# Patient Record
Sex: Male | Born: 1970
Health system: Southern US, Community
[De-identification: ages and names within clinical notes are randomized; demographics above are authoritative.]

## PROBLEM LIST (undated history)

## (undated) DIAGNOSIS — I10 Essential (primary) hypertension: Secondary | ICD-10-CM

## (undated) DIAGNOSIS — I639 Cerebral infarction, unspecified: Secondary | ICD-10-CM

## (undated) DIAGNOSIS — F4323 Adjustment disorder with mixed anxiety and depressed mood: Secondary | ICD-10-CM

## (undated) DIAGNOSIS — E119 Type 2 diabetes mellitus without complications: Secondary | ICD-10-CM

## (undated) HISTORY — DX: Type 2 diabetes mellitus without complications: E11.9

## (undated) HISTORY — DX: Cerebral infarction, unspecified: I63.9

---

## 2017-03-18 ENCOUNTER — Encounter (HOSPITAL_COMMUNITY): Payer: Self-pay

## 2017-03-18 ENCOUNTER — Emergency Department (HOSPITAL_COMMUNITY): Payer: Medicaid Other

## 2017-03-18 ENCOUNTER — Inpatient Hospital Stay (HOSPITAL_COMMUNITY)
Admission: EM | Admit: 2017-03-18 | Discharge: 2017-03-23 | DRG: 062 | Disposition: A | Discharge status: Rehabilitation Facility | Payer: Medicaid Other | Attending: Neurology | Admitting: Neurology

## 2017-03-18 DIAGNOSIS — I161 Hypertensive emergency: Secondary | ICD-10-CM | POA: Diagnosis present

## 2017-03-18 DIAGNOSIS — I10 Essential (primary) hypertension: Secondary | ICD-10-CM | POA: Diagnosis present

## 2017-03-18 DIAGNOSIS — I69354 Hemiplegia and hemiparesis following cerebral infarction affecting left non-dominant side: Secondary | ICD-10-CM | POA: Diagnosis not present

## 2017-03-18 DIAGNOSIS — I6350 Cerebral infarction due to unspecified occlusion or stenosis of unspecified cerebral artery: Secondary | ICD-10-CM | POA: Diagnosis present

## 2017-03-18 DIAGNOSIS — Z6832 Body mass index (BMI) 32.0-32.9, adult: Secondary | ICD-10-CM | POA: Diagnosis not present

## 2017-03-18 DIAGNOSIS — E876 Hypokalemia: Secondary | ICD-10-CM | POA: Diagnosis present

## 2017-03-18 DIAGNOSIS — E669 Obesity, unspecified: Secondary | ICD-10-CM | POA: Diagnosis present

## 2017-03-18 DIAGNOSIS — R9082 White matter disease, unspecified: Secondary | ICD-10-CM | POA: Diagnosis present

## 2017-03-18 DIAGNOSIS — I63211 Cerebral infarction due to unspecified occlusion or stenosis of right vertebral arteries: Secondary | ICD-10-CM | POA: Diagnosis present

## 2017-03-18 DIAGNOSIS — I639 Cerebral infarction, unspecified: Secondary | ICD-10-CM | POA: Diagnosis present

## 2017-03-18 DIAGNOSIS — R29702 NIHSS score 2: Secondary | ICD-10-CM | POA: Diagnosis present

## 2017-03-18 DIAGNOSIS — I672 Cerebral atherosclerosis: Secondary | ICD-10-CM | POA: Diagnosis present

## 2017-03-18 DIAGNOSIS — Z7982 Long term (current) use of aspirin: Secondary | ICD-10-CM

## 2017-03-18 DIAGNOSIS — R739 Hyperglycemia, unspecified: Secondary | ICD-10-CM | POA: Diagnosis present

## 2017-03-18 DIAGNOSIS — E785 Hyperlipidemia, unspecified: Secondary | ICD-10-CM | POA: Diagnosis present

## 2017-03-18 HISTORY — DX: Essential (primary) hypertension: I10

## 2017-03-18 LAB — RAPID URINE DRUG SCREEN, HOSP PERFORMED
AMPHETAMINES: NOT DETECTED
Barbiturates: NOT DETECTED
Benzodiazepines: NOT DETECTED
Cocaine: NOT DETECTED
OPIATES: NOT DETECTED
TETRAHYDROCANNABINOL: NOT DETECTED

## 2017-03-18 LAB — I-STAT CG4 LACTIC ACID, ED: LACTIC ACID, VENOUS: 2.23 mmol/L — AB (ref 0.5–1.9)

## 2017-03-18 LAB — I-STAT CHEM 8, ED
BUN: 17 mg/dL (ref 6–20)
Calcium, Ion: 1.1 mmol/L — ABNORMAL LOW (ref 1.15–1.40)
Chloride: 99 mmol/L — ABNORMAL LOW (ref 101–111)
Creatinine, Ser: 1.1 mg/dL (ref 0.61–1.24)
Glucose, Bld: 153 mg/dL — ABNORMAL HIGH (ref 65–99)
HEMATOCRIT: 42 % (ref 39.0–52.0)
HEMOGLOBIN: 14.3 g/dL (ref 13.0–17.0)
POTASSIUM: 3.4 mmol/L — AB (ref 3.5–5.1)
SODIUM: 139 mmol/L (ref 135–145)
TCO2: 29 mmol/L (ref 0–100)

## 2017-03-18 LAB — URINALYSIS, MICROSCOPIC (REFLEX)
BACTERIA UA: NONE SEEN
Squamous Epithelial / LPF: NONE SEEN

## 2017-03-18 LAB — URINALYSIS, ROUTINE W REFLEX MICROSCOPIC
BILIRUBIN URINE: NEGATIVE
Glucose, UA: 50 mg/dL — AB
KETONES UR: NEGATIVE mg/dL
Leukocytes, UA: NEGATIVE
NITRITE: NEGATIVE
PH: 7 (ref 5.0–8.0)
Protein, ur: 100 mg/dL — AB
Specific Gravity, Urine: 1.02 (ref 1.005–1.030)

## 2017-03-18 LAB — COMPREHENSIVE METABOLIC PANEL
ALBUMIN: 3.8 g/dL (ref 3.5–5.0)
ALK PHOS: 73 U/L (ref 38–126)
ALT: 16 U/L — ABNORMAL LOW (ref 17–63)
AST: 22 U/L (ref 15–41)
Anion gap: 9 (ref 5–15)
BUN: 13 mg/dL (ref 6–20)
CALCIUM: 9.2 mg/dL (ref 8.9–10.3)
CO2: 26 mmol/L (ref 22–32)
CREATININE: 1.24 mg/dL (ref 0.61–1.24)
Chloride: 101 mmol/L (ref 101–111)
GFR calc Af Amer: >60 mL/min (ref >60)
GFR calc non Af Amer: >60 mL/min (ref >60)
GLUCOSE: 148 mg/dL — AB (ref 65–99)
Potassium: 3.4 mmol/L — ABNORMAL LOW (ref 3.5–5.1)
Sodium: 136 mmol/L (ref 135–145)
Total Bilirubin: 0.8 mg/dL (ref 0.3–1.2)
Total Protein: 6.9 g/dL (ref 6.5–8.1)

## 2017-03-18 LAB — APTT: APTT: 26 s (ref 24–36)

## 2017-03-18 LAB — DIFFERENTIAL
BASOS ABS: 0 10*3/uL (ref 0.0–0.1)
BASOS PCT: 0 %
EOS ABS: 0.3 10*3/uL (ref 0.0–0.7)
EOS PCT: 3 %
LYMPHS ABS: 5.5 10*3/uL — AB (ref 0.7–4.0)
Lymphocytes Relative: 52 %
Monocytes Absolute: 0.7 10*3/uL (ref 0.1–1.0)
Monocytes Relative: 7 %
NEUTROS PCT: 38 %
Neutro Abs: 4 10*3/uL (ref 1.7–7.7)

## 2017-03-18 LAB — CBC
HCT: 39.7 % (ref 39.0–52.0)
HEMOGLOBIN: 13.8 g/dL (ref 13.0–17.0)
MCH: 27.8 pg (ref 26.0–34.0)
MCHC: 34.8 g/dL (ref 30.0–36.0)
MCV: 80 fL (ref 78.0–100.0)
PLATELETS: 378 10*3/uL (ref 150–400)
RBC: 4.96 MIL/uL (ref 4.22–5.81)
RDW: 12.9 % (ref 11.5–15.5)
WBC: 10.6 10*3/uL — ABNORMAL HIGH (ref 4.0–10.5)

## 2017-03-18 LAB — ETHANOL

## 2017-03-18 LAB — I-STAT TROPONIN, ED: Troponin i, poc: 0 ng/mL (ref 0.00–0.08)

## 2017-03-18 LAB — PROTIME-INR
INR: 0.96
PROTHROMBIN TIME: 12.8 s (ref 11.4–15.2)

## 2017-03-18 LAB — MRSA PCR SCREENING: MRSA BY PCR: NEGATIVE

## 2017-03-18 MED ORDER — SODIUM CHLORIDE 0.9 % IV SOLN
INTRAVENOUS | Status: DC
  Administered 2017-03-18 – 2017-03-22 (×4): via INTRAVENOUS

## 2017-03-18 MED ORDER — LABETALOL HCL 5 MG/ML IV SOLN
10.0000 mg | Freq: Once | INTRAVENOUS | Status: AC
  Administered 2017-03-18: 10 mg via INTRAVENOUS
  Filled 2017-03-18: qty 4

## 2017-03-18 MED ORDER — ACETAMINOPHEN 325 MG PO TABS
650.0000 mg | ORAL_TABLET | ORAL | Status: DC | PRN
  Administered 2017-03-20 (×3): 650 mg via ORAL
  Filled 2017-03-18 (×3): qty 2

## 2017-03-18 MED ORDER — ASPIRIN 325 MG PO TABS: 325.0000 mg | ORAL_TABLET | Freq: Every day | ORAL | Status: DC

## 2017-03-18 MED ORDER — ACETAMINOPHEN 650 MG RE SUPP: 650.0000 mg | RECTAL | Status: DC | PRN

## 2017-03-18 MED ORDER — STROKE: EARLY STAGES OF RECOVERY BOOK
Freq: Once | Status: AC
  Administered 2017-03-18: 1
  Filled 2017-03-18 (×2): qty 1

## 2017-03-18 MED ORDER — ONDANSETRON HCL 4 MG/2ML IJ SOLN
INTRAMUSCULAR | Status: AC
  Administered 2017-03-18: 4 mg
  Filled 2017-03-18: qty 2

## 2017-03-18 MED ORDER — IOPAMIDOL (ISOVUE-370) INJECTION 76%
INTRAVENOUS | Status: AC
  Administered 2017-03-18: 50 mL
  Filled 2017-03-18: qty 50

## 2017-03-18 MED ORDER — ALTEPLASE (STROKE) FULL DOSE INFUSION
90.0000 mg | Freq: Once | INTRAVENOUS | Status: AC
  Administered 2017-03-18: 90 mg via INTRAVENOUS
  Filled 2017-03-18: qty 100

## 2017-03-18 MED ORDER — LABETALOL HCL 5 MG/ML IV SOLN
10.0000 mg | Freq: Once | INTRAVENOUS | Status: AC
  Administered 2017-03-18: 10 mg via INTRAVENOUS

## 2017-03-18 MED ORDER — LABETALOL HCL 5 MG/ML IV SOLN
10.0000 mg | INTRAVENOUS | Status: DC | PRN
  Administered 2017-03-21 – 2017-03-22 (×2): 10 mg via INTRAVENOUS
  Filled 2017-03-18 (×2): qty 4

## 2017-03-18 MED ORDER — CLEVIDIPINE BUTYRATE 0.5 MG/ML IV EMUL
0.0000 mg/h | INTRAVENOUS | Status: DC
  Administered 2017-03-18: 9 mg/h via INTRAVENOUS
  Administered 2017-03-18: 2 mg/h via INTRAVENOUS
  Administered 2017-03-19 (×3): 4 mg/h via INTRAVENOUS
  Administered 2017-03-20 (×2): 9 mg/h via INTRAVENOUS
  Administered 2017-03-20: 6 mg/h via INTRAVENOUS
  Administered 2017-03-20: 9 mg/h via INTRAVENOUS
  Administered 2017-03-20 (×2): 6 mg/h via INTRAVENOUS
  Administered 2017-03-20: 9 mg/h via INTRAVENOUS
  Administered 2017-03-20: 6 mg/h via INTRAVENOUS
  Administered 2017-03-21: 5 mg/h via INTRAVENOUS
  Filled 2017-03-18: qty 100
  Filled 2017-03-18 (×13): qty 50

## 2017-03-18 MED ORDER — PANTOPRAZOLE SODIUM 40 MG IV SOLR
40.0000 mg | Freq: Every day | INTRAVENOUS | Status: DC
  Administered 2017-03-18: 40 mg via INTRAVENOUS
  Filled 2017-03-18: qty 40

## 2017-03-18 MED ORDER — ACETAMINOPHEN 160 MG/5ML PO SOLN: 650.0000 mg | ORAL | Status: DC | PRN

## 2017-03-18 NOTE — ED Triage Notes (Signed)
Pt statred having numbness today on the left side with equal grips. pts blood pressure was 238/136 with EMS

## 2017-03-18 NOTE — ED Provider Notes (Signed)
[facility] DEPT Provider Note   CSN: [id number] Arrival date & time: 03/18/17  1526     History   Chief Complaint Chief Complaint  Patient presents with  . Hypertension  . Numbness    HPI Zachary [name] is a 46 y.o. male.  The history is provided by the patient.  Hypertension  This is a new problem. The current episode started 3 to 5 hours ago. The problem occurs constantly. The problem has not changed since onset.Pertinent negatives include no chest pain, no abdominal pain, no headaches and no shortness of breath. The symptoms are aggravated by walking. Nothing relieves the symptoms. He has tried nothing for the symptoms. The treatment provided no relief.    46 year old male who presents with sudden onset of dizziness, difficulty walking, and left-sided numbness at 1:30 today while he was lying on the couch. He has a history of hypertension, but has not been taking any medications for the past 9 months. Reports that he has been in his usual state of health. When he tried to get up from the couch today had significant difficulty walking. Denies any double vision but does feel blurry out of the left eye. Denies any difficulty speaking, slurred speech, nausea or vomiting, headache, chest pain or difficulty breathing. Shortly afterwards also noted some numbness down the left arm and left leg with subtle weakness. States that he has not been able to ambulate since onset of symptoms.  Past Medical History:  Diagnosis Date  . Hypertension     Patient Active Problem List   Diagnosis Date Noted  . 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 No pertinent surgical history.     Home Medications    Prior to Admission medications   Medication Sig Start Date End Date Taking? Authorizing Provider  Aspirin-Acetaminophen-Caffeine (EXCEDRIN PO) Take 2 tablets by mouth as needed (Headache/Migraine).   Yes [provider]    Family History No family history on  file.  Social History Social History  Substance Use Topics  . Smoking status: Never Smoker  . Smokeless tobacco: Never Used  . Alcohol use No     Allergies   Patient has no known allergies.   Review of Systems Review of Systems  Constitutional: Negative for fever.  Respiratory: Negative for shortness of breath.   Cardiovascular: Negative for chest pain.  Gastrointestinal: Negative for abdominal pain.  Neurological: Positive for numbness. Negative for syncope and headaches.  All other systems reviewed and are negative.    Physical Exam Updated Vital Signs BP (!) 157/98   Pulse (!) 103   Temp 98.5 F (36.9 C) (Oral)   Resp 17   Ht 5\' 10"  (1.778 m)   Wt 102.1 kg (225 lb)   SpO2 96%   BMI 32.28 kg/m   Physical Exam Physical Exam  Nursing note and vitals reviewed. Constitutional: Well developed, well nourished, non-toxic, and in no acute distress Head: Normocephalic and atraumatic.  Mouth/Throat: Oropharynx is clear and moist.  Neck: Normal range of motion. Neck supple.  Cardiovascular: Normal rate and regular rhythm.   Pulmonary/Chest: Effort normal and breath sounds normal.  Abdominal: Soft. There is no tenderness. There is no rebound and no guarding.  Musculoskeletal: Normal range of motion.  Skin: Skin is warm and dry.  Psychiatric: Cooperative Neurological:  Alert, oriented to person, place, time, and situation. Memory grossly in tact. Fluent speech. No dysarthria or aphasia.  Cranial nerves: VF are full. Pupils are symmetric, and reactive to  light.  Abnormal nystagmus possible rotary and vertical, bidirectional nystagmus. Left gaze preference. Facial muscles symmetric with activation. Sensation to light touch over face in tact bilaterally. Hearing grossly in tact. Palate elevates symmetrically. Head turn and shoulder shrug are intact. Tongue midline.  Reflexes defered.  Muscle bulk and tone normal. Subtle let pronator drift. Moves all extremities  symmetrically. Sensation to light touch is in tact throughout in bilateral upper extremities. Reported diminished sensation over the LLE Coordination reveals no dysmetria with finger to nose. No dysmetria with heel-knee-shin bilaterally   ED Treatments / Results  Labs (all labs ordered are listed, but only abnormal results are displayed) Labs Reviewed  CBC - Abnormal; Notable for the following:       Result Value   WBC 10.6 (*)    All other components within normal limits  DIFFERENTIAL - Abnormal; Notable for the following:    Lymphs Abs 5.5 (*)    All other components within normal limits  COMPREHENSIVE METABOLIC PANEL - Abnormal; Notable for the following:    Potassium 3.4 (*)    Glucose, Bld 148 (*)    ALT 16 (*)    All other components within normal limits  I-STAT CHEM 8, ED - Abnormal; Notable for the following:    Potassium 3.4 (*)    Chloride 99 (*)    Glucose, Bld 153 (*)    Calcium, Ion 1.10 (*)    All other components within normal limits  I-STAT CG4 LACTIC ACID, ED - Abnormal; Notable for the following:    Lactic Acid, Venous 2.23 (*)    All other components within normal limits  ETHANOL  PROTIME-INR  APTT  RAPID URINE DRUG SCREEN, HOSP PERFORMED  URINALYSIS, ROUTINE W REFLEX MICROSCOPIC  HIV ANTIBODY (ROUTINE TESTING)  HEMOGLOBIN A1C  LIPID PANEL  RAPID URINE DRUG SCREEN, HOSP PERFORMED  I-STAT TROPONIN, ED    EKG  EKG Interpretation  Date/Time:  Saturday March 18 2017 15:35:35 EDT Ventricular Rate:  92 PR Interval:    QRS Duration: 87 QT Interval:  354 QTC Calculation: 438 R Axis:   19 Text Interpretation:  Sinus rhythm Left atrial enlargement Borderline T abnormalities, lateral leads ST elevation, consider anterior injury no previous EKG  Confirmed by Brantley Stage [phone]) on 03/18/2017 3:47:00 PM       Radiology Ct Angio Head W Or Wo Contrast  Result Date: 03/18/2017 CLINICAL DATA:  46 year old male with dizziness, headache and left side numbness.  Last seen normal 1330 hours. EXAM: CT ANGIOGRAPHY HEAD AND NECK TECHNIQUE: Multidetector CT imaging of the head and neck was performed using the standard protocol during bolus administration of intravenous contrast. Multiplanar CT image reconstructions and MIPs were obtained to evaluate the vascular anatomy. Carotid stenosis measurements (when applicable) are obtained utilizing NASCET criteria, using the distal internal carotid diameter as the denominator. CONTRAST:  50 mL Isovue 370. COMPARISON:  Head CT without contrast 1647 hours today. FINDINGS: CTA NECK Skeleton: Straightening of cervical lordosis. No acute osseous abnormality identified. Upper chest: Negative lung apices. Negative visualized superior mediastinum. Other neck: Asymmetry of the larynx suggesting the possibility of right vocal cord paralysis. Pharyngeal soft tissue contours are within normal limits. Partially retropharyngeal course of the right carotid. Negative parapharyngeal and retropharyngeal spaces otherwise. Negative sublingual space, submandibular and parotid glands. No cervical lymphadenopathy. Leftward gaze deviation persists. Aortic arch: Bovine type arch configuration. Tortuous proximal great vessels. No arch or great vessel origin atherosclerosis. Right carotid system: Negative aside from a partially retropharyngeal course  of the right ICA. Left carotid system: Negative aside from mild tortuosity. Vertebral arteries:Mildly tortuous proximal subclavian arteries without stenosis. Normal vertebral artery origins. Both vertebral arteries appear normal to the skullbase, the left is mildly dominant. CTA HEAD Posterior circulation: Mild left V4 segment calcified plaque distal to the patent PICA origin without significant stenosis. More moderate right V4 segment calcified plaque proximal to the right PICA origin. Up to moderate stenosis. Mild to moderate additional irregularity of the distal right V4 segment. Patent vertebrobasilar junction.  Mild basilar artery irregularity without stenosis. Patent SCA and PCA origins, but severe stenosis at the left PCA origin (series 11, image 31). Posterior communicating arteries are diminutive or absent. Right PCA branches are within normal limits. The more distal left PCA branches are mildly irregular. Anterior circulation: Both ICA siphons are patent. Focal supraclinoid calcified plaque greater on the left, no significant stenosis. Patent carotid termini. Normal MCA and ACA origins. Left A1 segment is dominant and the right is diminutive. Anterior communicating artery and bilateral ACA branches are within normal limits. Right MCA M1 segment, trifurcation, and right MCA branches are within normal limits. Left MCA M1 segment, bifurcation, and left MCA branches are within normal limits. Venous sinuses: No venous phase contrast on these images. Anatomic variants: Bovine type arch configuration. Mildly dominant left vertebral artery. Dominant left and diminutive right ACA A1 segments. Review of the MIP images confirms the above findings IMPRESSION: 1. Negative for emergent large vessel occlusion. 2. Positive for moderate to severe stenosis of the distal Right vertebral artery, and Left PCA origin. Basilar artery irregularity also suggesting atherosclerosis, without significant stenosis. 3. Comparatively minimal atherosclerosis of the carotid arteries and anterior circulation. Electronically Signed   By: Genevie Ann M.D.   On: 03/18/2017 17:21   Ct Angio Neck W Or Wo Contrast  Result Date: 03/18/2017 CLINICAL DATA:  46 year old male with dizziness, headache and left side numbness. Last seen normal 1330 hours. EXAM: CT ANGIOGRAPHY HEAD AND NECK TECHNIQUE: Multidetector CT imaging of the head and neck was performed using the standard protocol during bolus administration of intravenous contrast. Multiplanar CT image reconstructions and MIPs were obtained to evaluate the vascular anatomy. Carotid stenosis measurements (when  applicable) are obtained utilizing NASCET criteria, using the distal internal carotid diameter as the denominator. CONTRAST:  50 mL Isovue 370. COMPARISON:  Head CT without contrast 1647 hours today. FINDINGS: CTA NECK Skeleton: Straightening of cervical lordosis. No acute osseous abnormality identified. Upper chest: Negative lung apices. Negative visualized superior mediastinum. Other neck: Asymmetry of the larynx suggesting the possibility of right vocal cord paralysis. Pharyngeal soft tissue contours are within normal limits. Partially retropharyngeal course of the right carotid. Negative parapharyngeal and retropharyngeal spaces otherwise. Negative sublingual space, submandibular and parotid glands. No cervical lymphadenopathy. Leftward gaze deviation persists. Aortic arch: Bovine type arch configuration. Tortuous proximal great vessels. No arch or great vessel origin atherosclerosis. Right carotid system: Negative aside from a partially retropharyngeal course of the right ICA. Left carotid system: Negative aside from mild tortuosity. Vertebral arteries:Mildly tortuous proximal subclavian arteries without stenosis. Normal vertebral artery origins. Both vertebral arteries appear normal to the skullbase, the left is mildly dominant. CTA HEAD Posterior circulation: Mild left V4 segment calcified plaque distal to the patent PICA origin without significant stenosis. More moderate right V4 segment calcified plaque proximal to the right PICA origin. Up to moderate stenosis. Mild to moderate additional irregularity of the distal right V4 segment. Patent vertebrobasilar junction. Mild basilar artery irregularity without stenosis. Patent  SCA and PCA origins, but severe stenosis at the left PCA origin (series 11, image 31). Posterior communicating arteries are diminutive or absent. Right PCA branches are within normal limits. The more distal left PCA branches are mildly irregular. Anterior circulation: Both ICA siphons are  patent. Focal supraclinoid calcified plaque greater on the left, no significant stenosis. Patent carotid termini. Normal MCA and ACA origins. Left A1 segment is dominant and the right is diminutive. Anterior communicating artery and bilateral ACA branches are within normal limits. Right MCA M1 segment, trifurcation, and right MCA branches are within normal limits. Left MCA M1 segment, bifurcation, and left MCA branches are within normal limits. Venous sinuses: No venous phase contrast on these images. Anatomic variants: Bovine type arch configuration. Mildly dominant left vertebral artery. Dominant left and diminutive right ACA A1 segments. Review of the MIP images confirms the above findings IMPRESSION: 1. Negative for emergent large vessel occlusion. 2. Positive for moderate to severe stenosis of the distal Right vertebral artery, and Left PCA origin. Basilar artery irregularity also suggesting atherosclerosis, without significant stenosis. 3. Comparatively minimal atherosclerosis of the carotid arteries and anterior circulation. Electronically Signed   By: Genevie Ann M.D.   On: 03/18/2017 17:21   Ct Head Code Stroke Wo Contrast  Result Date: 03/18/2017 CLINICAL DATA:  Code stroke. 46 year old male with dizziness, headache and left side numbness. Last seen normal 1330 hours. EXAM: CT HEAD WITHOUT CONTRAST TECHNIQUE: Contiguous axial images were obtained from the base of the skull through the vertex without intravenous contrast. COMPARISON:  None. FINDINGS: Brain: No acute intracranial hemorrhage identified. No midline shift, mass effect, or evidence of intracranial mass lesion. No ventriculomegaly. Patchy bilateral mostly subcortical white matter hypodensity is advanced for age. Heterogeneity in the pons suggests chronic lacunar infarcts there. No cortical encephalomalacia or No cortically based acute infarct identified. Vascular: Calcified atherosclerosis at the skull base. Generalized intracranial artery  tortuosity. No asymmetric or suspicious intracranial vascular hyperdensity. Skull: No acute osseous abnormality identified. Sinuses/Orbits: Scattered frontal, ethmoid and mild maxillary sinus mucosal thickening and/or mucous retention cysts. Sphenoid sinuses, tympanic cavities and mastoids are clear. Other: Leftward gaze deviation. Visualized scalp soft tissues are within normal limits. ASPECTS [facility] Stroke Program Early CT Score) - Ganglionic level infarction (caudate, lentiform nuclei, internal capsule, insula, M1-M3 cortex): 7 - Supraganglionic infarction (M4-M6 cortex): 3 Total score (0-10 with 10 being normal): 10 IMPRESSION: 1. Age advanced intracranial atherosclerosis and white matter disease, plus evidence of lacunar infarcts in the pons. No acute cortically based infarct or acute intracranial hemorrhage identified. 2. ASPECTS is 10. 3. The above was relayed via text pager to Dr. Chauncey Cruel. A[place]or on 03/18/2017 at 17:05 . Electronically Signed   By: Genevie Ann M.D.   On: 03/18/2017 17:05    Procedures Procedures (including critical care time) CRITICAL CARE Performed by: Forde Dandy   Total critical care time: 35 minutes  Critical care time was exclusive of separately billable procedures and treating other patients.  Critical care was necessary to treat or prevent imminent or life-threatening deterioration.  Critical care was time spent personally by me on the following activities: development of treatment plan with patient and/or surrogate as well as nursing, discussions with consultants, evaluation of patient's response to treatment, examination of patient, obtaining history from patient or surrogate, ordering and performing treatments and interventions, ordering and review of laboratory studies, ordering and review of radiographic studies, pulse oximetry and re-evaluation of patient's condition.  Medications Ordered in ED Medications  clevidipine (CLEVIPREX)  infusion 0.5 mg/mL (10 mg/hr  Intravenous Rate/Dose Change 03/18/17 1820)   stroke: mapping our early stages of recovery book (not administered)  0.9 %  sodium chloride infusion (not administered)  acetaminophen (TYLENOL) tablet 650 mg (not administered)    Or  acetaminophen (TYLENOL) solution 650 mg (not administered)    Or  acetaminophen (TYLENOL) suppository 650 mg (not administered)  pantoprazole (PROTONIX) injection 40 mg (not administered)  aspirin tablet 325 mg (not administered)  labetalol (NORMODYNE,TRANDATE) injection 10 mg (10 mg Intravenous Given 03/18/17 1638)  iopamidol (ISOVUE-370) 76 % injection (50 mLs  Contrast Given 03/18/17 1654)  labetalol (NORMODYNE,TRANDATE) injection 10 mg (10 mg Intravenous Given 03/18/17 1719)  alteplase (ACTIVASE) 1 mg/mL infusion 90 mg (90 mg Intravenous New Bag/Given 03/18/17 1724)  ondansetron (ZOFRAN) 4 MG/2ML injection (4 mg  Given 03/18/17 1745)     Initial Impression / Assessment and Plan / ED Course  I have reviewed the triage vital signs and the nursing notes.  Pertinent labs & imaging results that were available during my care of the patient were reviewed by me and considered in my medical decision making (see chart for details).     46 year old male who presents with sudden onset of dizziness, gait instability, and left-sided numbness and weakness. On my evaluation he is 3 hours out from symptom onset. Code stroke was activated. His exam is concerning for abnormal gaze preference towards the left with abnormal nystagmus, possible rotary or vertical component and bidirectional in nature right worse than left. He has subtle diminished sensation in the left lower extremity and possible subtle left pronator drift. He is extremely hypertensive here with blood pressures 230s over 110s, and written for labetalol. CT head visualized and shows no acute intracranial processes. He is evaluated by neurology who has administered him TPA.  Final Clinical Impressions(s) / ED Diagnoses    Final diagnoses:  Acute CVA (cerebrovascular accident) [facility])    New Prescriptions Current Discharge Medication List       Forde Dandy, MD 03/18/17 [phone]

## 2017-03-18 NOTE — Code Documentation (Signed)
46 y.o. Male with PMHx of HTN. Per the patient, he states he has not taken his BP meds in approximately 9 months d/t financial reasons. He was in his normal state of health today while he way watching TV when he had an acute onset of severe dizziness, difficulty walking and subsequent left sided numbness and tingling that begins at his neck. EMS reported BP of 238/136 and a negative stroke screen. Per the ED RN, RN stroke screen was negative despite the RN noting stroke symptoms. EDP notified of unilateral numbness. 10 mg labetalol given for HTN. EDP assessed patient and called code stroke. Stroke team arrived to the patient in CT. CT revealed no acute intracranial abnormalities. ASPECTS 10. NIHSS 2. See EMR for NIHSS and code stroke times. CTA with no LVO. Not a thrombectomy candidate. On exam, patient with slight LUE pronator drift and left sided numbness. IV tPA was initially not going to be given d/t being too mild to treat. Thus BP was not treated aggressively. The patient was ambulated by the neurologist and had difficulties with ambulation. D/t other deficits and clear ambulation issues, the decision to treat with IV tPA was made. BP treated aggressively with gtt and once BP was within parameters, IV tPA started. ED bedside handoff with ED RN Rod Holler.

## 2017-03-18 NOTE — ED Notes (Signed)
[facility] contacted to activate Code Stroke 

## 2017-03-18 NOTE — H&P (Addendum)
CC: Imabalance   HPI:                                                                                                                                      Zachary [name] is an 46 y.o. male with a past medical history significant for obesity and hypertension who presents to [facility] with complaints of severe dizziness, difficulty walking and subsequent left sided numbness and tingling that begins at his neck. He states that he was laying down, watching TV when the episode began. He denies double vision, but endorses blurry vision, particularly out of the left eye, denies headache, weakness, slurred speech, aphasia and any other focal neurological deficit.   He states that he has not taken his blood pressure medication in approximately nine months because of financial reasons. He denies a personal or family history of stroke, hyperlipidemia, heart disease, smoking.  En route to the ED, his blood pressure was 238/136. It trended downwards to 176/109 immediately prior to TPA administration with Cleviprex titration and 20mg  labetalol total.  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 Lacunar infarcts in the pons. No acute abnormality. CTA head/neck 03/18/17: Negative for LVO. Moderate to severe stenosis of the distal Right vertebral artery, and Left PCA origin. Basilar artery irregularity also suggesting atherosclerosis, without significant stenosis.    Stroke Risk Factors - hypertension       Past Medical History:  Diagnosis Date  . Hypertension     History reviewed. No pertinent surgical history.  No family history on file.   reports that he has never smoked. He has never used smokeless tobacco. He reports that he does not drink alcohol or use drugs.  No Known Allergies  Medications:                                                                                                                        Active Medications      Current Meds  Medication Sig  . Aspirin-Acetaminophen-Caffeine (EXCEDRIN PO) Take 2 tablets by mouth as needed (Headache/Migraine).      Review Of Systems:  History obtained from the patient  General: Negative for fatigue, fever Psychological: Negative for any known behavioral disorder Ophthalmic: As above ENT: As above Respiratory: Negative for shortness of breath Cardiovascular: Negative for chest pain, or irregular heartbeat Gastrointestinal: Negative for abdominal pain Musculoskeletal: Negative for joint swelling or muscular weakness Neurological: As noted in HPI Dermatological: As above  Blood pressure (!) 227/158, pulse 97, temperature 98.5 F (36.9 C), temperature source Oral, resp. rate 15, height 5\' 10"  (1.778 m), weight 102.1 kg (225 lb), SpO2 96 %.   Physical Examination:                                                                                                      General: WDWN male.  HEENT:  Normocephalic, no lesions, without obvious abnormality.  Normal external eye and conjunctiva.  Normal external ears. Normal external nose, mucus membranes and septum.  Normal pharynx. Cardiovascular: S1, S2 normal, pulses palpable throughout   Pulmonary: Chest clear, unlabored Abdomen: Obese, soft, non-tender Musculoskeletal/Extremities: no joint deformities, effusion, or inflammation and no edema Skin: warm and dry, no hyperpigmentation, vitiligo, or suspicious lesions  Neurological Examination:                                                                                               Mental Status: Zachary [name] is alert, oriented, thought content appropriate.  Speech fluent without evidence of aphasia. Able to follow  3-step commands without difficulty. Cranial Nerves: II: Visual fields grossly normal, pupils are equal, round, reactive to light. III,IV, VI: Ptosis not present. Left gaze preference. Nystagmus with front gaze when sitting up. Significant nystagmus with rightward gaze. V,VII: Smile and eyebrow raise is symmetric. Facial light touch intact bilaterally VIII: Hearing grossly intact IX,X: Uvula and palate rise symmetrically XI: SCM and bilateral shoulder shrug strength 5/5 XII: Midline tongue extension Motor: Right :     Upper extremity   5/5                              Left:     Upper extremity   5/5                     Lower extremity   5/5                                          Lower extremity   5/5 Pronator drift not present Sensory: Pinprick and light touch intact throughout, bilaterally Deep Tendon Reflexes: 2+ and symmetric throughout right extremities. Dropped left  sided reflexes Plantars: Right: downgoing                           Left: downgoing Cerebellar: Finger-to-nose test with dysmetria and mild ataxia on left side Gait: Markedly unsteady on standing  Lab Results: Basic Metabolic Panel:  Last Labs    Recent Labs Lab 03/18/17 1647  NA 139  K 3.4*  CL 99*  GLUCOSE 153*  BUN 17  CREATININE 1.10      Liver Function Tests: Last Labs   No results for input(s): AST, ALT, ALKPHOS, BILITOT, PROT, ALBUMIN in the last 168 hours.   Last Labs   No results for input(s): LIPASE, AMYLASE in the last 168 hours.   Last Labs   No results for input(s): AMMONIA in the last 168 hours.    CBC:  Last Labs    Recent Labs Lab 03/18/17 1537 03/18/17 1647  WBC 10.6*  --   NEUTROABS 4.0  --   HGB 13.8 14.3  HCT 39.7 42.0  MCV 80.0  --   PLT 378  --       Cardiac Enzymes: Last Labs   No results for input(s): CKTOTAL, CKMB, CKMBINDEX, TROPONINI in the last 168 hours.    Lipid Panel: Last Labs   No results for input(s): CHOL, TRIG, HDL, CHOLHDL, VLDL,  LDLCALC in the last 168 hours.    CBG: Last Labs   No results for input(s): GLUCAP in the last 168 hours.    Microbiology: No results found for this or any previous visit.  Coagulation Studies: Recent Labs (last 2 labs)   No results for input(s): LABPROT, INR in the last 72 hours.    Imaging: Imaging Results (Last 48 hours)  No results found.     Assessment Zachary [name] is a 46 year old male with a past medical history significant only for hypertension and obesity who presents to [facility] with complaints of dizziness, unsteady gait and left sided numbness and tingling that began at approximately 1330.  primary symptoms - left sided numbness/tingling, leftward gaze preference, ataxia/dysmetria, severely unsteady gait - with imaging suggestive of posterior circulation atherosclerosis, namely stenosis of the right vertebral artery and left PCA origin, is suggestive of a right pontine stroke.   Although NIHSS of 3,TPA was provided as his symptoms were disabling. TPA was delayed 20 minutes because of difficulty controlling blood pressure. He received multiple doses of labetalol, and required a few minutes of infusion with Cleviprex. 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  Recommend #Admit to ICU, frequent neurochecks # MRI of the brain without contrast #Transthoracic Echo  # Start patient on ASA 81mg  and Plavix ( DAPT x 90 days due to ICAD)  After 24 hr CT post tPA negative for hemorrhage #Start or continue Atorvastatin 40 mg/other high intensity statin # BP goal: permissive HTN upto 517 systolic, PRNs above # HBAIC and Lipid profile # Telemetry monitoring # Frequent neuro checks # NPO until passes stroke swallow screen   Lupita Raider PA-C [facility] [phone]  03/18/2017, 4:58 PM   NEUROHOSPITALIST ADDENDUM Seen and examined the patient this  AM. Formulated plan as documented above. Recommendations as above.   Karena Addison Ajai Terhaar MD [facility] [phone]  If 7pm to 7am, please call on call as listed on AMION.

## 2017-03-18 NOTE — Consult Note (Signed)
Requesting Physician: Dr. Oleta Mouse    Chief Complaint: Numbness and tingling  History obtained from:  Patient and chart  HPI:                                                                                                                                      Zachary [name] is an 46 y.o. male with a past medical history significant for obesity and hypertension who presents to [facility] with complaints of severe dizziness, difficulty walking and subsequent left sided numbness and tingling that begins at his neck. He states that he was laying down, watching TV when the episode began. He denies double vision, but endorses blurry vision, particularly out of the left eye, denies headache, weakness, slurred speech, aphasia and any other focal neurological deficit.   He states that he has not taken his blood pressure medication in approximately nine months because of financial reasons. He denies a personal or family history of stroke, hyperlipidemia, heart disease, smoking.  En route to the ED, his blood pressure was 238/136. It trended downwards to 176/109 immediately prior to TPA administration with Cleviprex titration and 20mg  labetalol total.   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 Lacunar infarcts in the pons. No acute abnormality. CTA head/neck 03/18/17: Negative for LVO. Moderate to severe stenosis of the distal Right vertebral artery, and Left PCA origin. Basilar artery irregularity also suggesting atherosclerosis, without significant stenosis.  Date last known well: Date: 03/18/2017 Time last known well: Time: 13:30 NIHSS 2 Modified Rankin Score: 0 tPA Given: Yes MT: NO    Stroke Risk Factors - hypertension   Past Medical History:  Diagnosis Date  . Hypertension     History reviewed. No pertinent surgical history.  No family history on file.   reports that he has never smoked. He has never used smokeless tobacco. He reports that  he does not drink alcohol or use drugs.  No Known Allergies  Medications:                                                                                                                       Current Meds  Medication Sig  . Aspirin-Acetaminophen-Caffeine (EXCEDRIN PO) Take 2 tablets by mouth as needed (Headache/Migraine).    Review Of Systems:  History obtained from the patient  General: Negative for fatigue, fever Psychological: Negative for any known behavioral disorder Ophthalmic: As above ENT: As above Respiratory: Negative for shortness of breath Cardiovascular: Negative for chest pain, or irregular heartbeat Gastrointestinal: Negative for abdominal pain Musculoskeletal: Negative for joint swelling or muscular weakness Neurological: As noted in HPI Dermatological: As above  Blood pressure (!) 227/158, pulse 97, temperature 98.5 F (36.9 C), temperature source Oral, resp. rate 15, height 5\' 10"  (1.778 m), weight 102.1 kg (225 lb), SpO2 96 %.   Physical Examination:                                                                                                      General: WDWN male.  HEENT:  Normocephalic, no lesions, without obvious abnormality.  Normal external eye and conjunctiva.  Normal external ears. Normal external nose, mucus membranes and septum.  Normal pharynx. Cardiovascular: S1, S2 normal, pulses palpable throughout   Pulmonary: Chest clear, unlabored Abdomen: Obese, soft, non-tender Musculoskeletal/Extremities: no joint deformities, effusion, or inflammation and no edema Skin: warm and dry, no hyperpigmentation, vitiligo, or suspicious lesions  Neurological Examination:                                                                                               Mental Status: Zachary [name] is alert, oriented, thought content appropriate.  Speech fluent  without evidence of aphasia. Able to follow 3-step commands without difficulty. Cranial Nerves: II: Visual fields grossly normal, pupils are equal, round, reactive to light. III,IV, VI: Ptosis not present. Left gaze preference. Nystagmus with front gaze when sitting up. Significant nystagmus with rightward gaze. V,VII: Smile and eyebrow raise is symmetric. Facial light touch intact bilaterally VIII: Hearing grossly intact IX,X: Uvula and palate rise symmetrically XI: SCM and bilateral shoulder shrug strength 5/5 XII: Midline tongue extension Motor: Right :     Upper extremity   5/5   Left:     Upper extremity   5/5          Lower extremity   5/5    Lower extremity   5/5 Pronator drift not present Sensory: Pinprick and light touch intact throughout, bilaterally Deep Tendon Reflexes: 2+ and symmetric throughout right extremities. Dropped left sided reflexes Plantars: Right: downgoing   Left: downgoing Cerebellar: Finger-to-nose test with dysmetria and mild ataxia, left greater than right Gait: Markedly unsteady on standing  Lab Results: Basic Metabolic Panel:  Recent Labs Lab 03/18/17 1647  NA 139  K 3.4*  CL 99*  GLUCOSE 153*  BUN 17  CREATININE 1.10    Liver Function Tests: No results for input(s): AST, ALT, ALKPHOS, BILITOT, PROT, ALBUMIN in the last 168  hours. No results for input(s): LIPASE, AMYLASE in the last 168 hours. No results for input(s): AMMONIA in the last 168 hours.  CBC:  Recent Labs Lab 03/18/17 1537 03/18/17 1647  WBC 10.6*  --   NEUTROABS 4.0  --   HGB 13.8 14.3  HCT 39.7 42.0  MCV 80.0  --   PLT 378  --     Cardiac Enzymes: No results for input(s): CKTOTAL, CKMB, CKMBINDEX, TROPONINI in the last 168 hours.  Lipid Panel: No results for input(s): CHOL, TRIG, HDL, CHOLHDL, VLDL, LDLCALC in the last 168 hours.  CBG: No results for input(s): GLUCAP in the last 168 hours.  Microbiology: No results found for this or any previous  visit.  Coagulation Studies: No results for input(s): LABPROT, INR in the last 72 hours.  Imaging: No results found.   Assessment Zachary [name] is a 47 year old male with a past medical history significant only for hypertension and obesity who presents to [facility] with complaints of dizziness, unsteady gait and left sided numbness and tingling that began at approximately 1330. His hypertension, primary symptoms - left sided numbness/tingling, leftward gaze preference, ataxia/dysmetria, severely unsteady gait - with imaging suggestive of posterior circulation atherosclerosis, namely stenosis of the right vertebral artery and left PCA origin, is suggestive of a left pontine stroke. TPA was provided based on the severity of his symptoms. We will evaluate further based on a routine stroke workup.  Plan:  1. HgbA1c, fasting lipid panel 2. MRI, MRA  of the brain without contrast 3. PT consult, OT consult, Speech consult 4. Echocardiogram 6. Prophylactic therapy-Antiplatelet med: Aspirin - dose 325 7. Risk factor modification 8. Telemetry monitoring 9. Frequent neuro checks 10 NPO until passes stroke swallow screen  Lupita Raider PA-C [facility] [phone]  03/18/2017, 4:58 PM   NEUROHOSPITALIST ADDENDUM Please see plan and note under H&P  Karena Addison Michaelyn Wall MD [facility]

## 2017-03-19 ENCOUNTER — Inpatient Hospital Stay (HOSPITAL_COMMUNITY): Payer: Medicaid Other

## 2017-03-19 DIAGNOSIS — I69398 Other sequelae of cerebral infarction: Secondary | ICD-10-CM

## 2017-03-19 LAB — ECHOCARDIOGRAM COMPLETE
CHL CUP MV DEC (S): 195
EERAT: 10.51
EWDT: 195 ms
FS: 26 % — AB (ref 28–44)
HEIGHTINCHES: 70 in
IV/PV OW: 0.83
LA diam index: 1.92 cm/m2
LA vol A4C: 43.6 ml
LA vol index: 23.2 mL/m2
LA vol: 50.8 mL
LASIZE: 42 mm
LEFT ATRIUM END SYS DIAM: 42 mm
LV E/e' medial: 10.51
LV TDI E'LATERAL: 7.29
LV e' LATERAL: 7.29 cm/s
LVEEAVG: 10.51
LVOT SV: 79 mL
LVOT VTI: 20.8 cm
LVOT area: 3.8 cm2
LVOT peak grad rest: 5 mmHg
LVOTD: 22 mm
LVOTPV: 116 cm/s
Lateral S' vel: 14.9 cm/s
MV Peak grad: 2 mmHg
MV pk A vel: 80.4 m/s
MV pk E vel: 76.6 m/s
PW: 12 mm — AB (ref 0.6–1.1)
TAPSE: 25.3 mm
TDI e' medial: 5.46
WEIGHTICAEL: 3600 [oz_av]

## 2017-03-19 LAB — LIPID PANEL
CHOL/HDL RATIO: 3.3 ratio
Cholesterol: 133 mg/dL (ref 0–200)
HDL: 40 mg/dL — AB (ref >40)
LDL CALC: 80 mg/dL (ref 0–99)
TRIGLYCERIDES: 67 mg/dL (ref <150)
VLDL: 13 mg/dL (ref 0–40)

## 2017-03-19 LAB — HIV ANTIBODY (ROUTINE TESTING W REFLEX): HIV Screen 4th Generation wRfx: NONREACTIVE

## 2017-03-19 MED ORDER — ATORVASTATIN CALCIUM 20 MG PO TABS
20.0000 mg | ORAL_TABLET | Freq: Every day | ORAL | Status: DC
  Administered 2017-03-19 – 2017-03-22 (×4): 20 mg via ORAL
  Filled 2017-03-19 (×4): qty 1

## 2017-03-19 MED ORDER — IOPAMIDOL (ISOVUE-300) INJECTION 61%
INTRAVENOUS | Status: AC
  Administered 2017-03-19: 75 mL
  Filled 2017-03-19: qty 75

## 2017-03-19 MED ORDER — ASPIRIN EC 81 MG PO TBEC
81.0000 mg | DELAYED_RELEASE_TABLET | Freq: Every day | ORAL | Status: DC
  Administered 2017-03-19 – 2017-03-22 (×4): 81 mg via ORAL
  Filled 2017-03-19 (×4): qty 1

## 2017-03-19 MED ORDER — PANTOPRAZOLE SODIUM 40 MG PO TBEC
40.0000 mg | DELAYED_RELEASE_TABLET | Freq: Every day | ORAL | Status: DC
  Administered 2017-03-19 – 2017-03-22 (×4): 40 mg via ORAL
  Filled 2017-03-19 (×4): qty 1

## 2017-03-19 MED ORDER — POTASSIUM CHLORIDE CRYS ER 20 MEQ PO TBCR
20.0000 meq | EXTENDED_RELEASE_TABLET | Freq: Two times a day (BID) | ORAL | Status: AC
  Administered 2017-03-19 – 2017-03-20 (×4): 20 meq via ORAL
  Filled 2017-03-19 (×4): qty 1

## 2017-03-19 NOTE — Plan of Care (Signed)
Problem: Self-Care: Goal: Ability to participate in self-care as condition permits will improve Outcome: Completed/Met Date Met: 03/19/17 Pt was able to bathe himself with minimal assist.

## 2017-03-19 NOTE — Evaluation (Signed)
Speech Language Pathology Evaluation Patient Details Name: Zachary [name] MRN: [id number] DOB: 1970-10-14 Today's Date: 03/19/2017 Time: 3354-5625 SLP Time Calculation (min) (ACUTE ONLY): 23 min  Problem List:  Patient Active Problem List   Diagnosis Date Noted  . 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 Hypertension    Past Surgical History: History reviewed. No pertinent surgical history. HPI:  Zachary Coleman Shippis an 46 y.o.malewith a past medical history significant for obesity and hypertension who presents to [facility] with complaints of severe dizziness, difficulty walking and subsequent left sided numbness and tingling that begins at his neck. He states that he was laying down, watching TV when the episode began. He denies double vision, but endorses blurry vision, particularly out of the left eye, denies headache, weakness, slurred speech, aphasia and any other focal neurological deficit.  He states that he has not taken his blood pressure medication in approximately nine months because of financial reasons. He denies a personal or family history of stroke, hyperlipidemia, heart disease, smoking.  CT head is negative for acute findings.  MRI is pending at this time.     Assessment / Plan / Recommendation Clinical Impression  Cognitive/linguistic and motor speech screen were completed.  The patient's speech was clear and easy to understand.  No discernible dysarthria was noted and oral mechnism exam was unremarkable.  The patient achieved a score of 28/30 on the [facility] Cognitive Assessment B (MOCA-B) suggesting functional cognitive/linguistic skills.  He was also able to provide write and short sentence and read a paragraph as well as complete a clock drawing task.  Given results of assessment ST follow up is not indicated.      SLP Assessment  SLP Recommendation/Assessment: Patient does not need any further Speech Lanaguage  Pathology Services             SLP Evaluation Cognition  Overall Cognitive Status: Within Functional Limits for tasks assessed Arousal/Alertness: Awake/alert Orientation Level: Oriented X4 Attention: Sustained Sustained Attention: Appears intact Memory: Appears intact Awareness: Appears intact Problem Solving: Appears intact Safety/Judgment: Appears intact       Comprehension  Auditory Comprehension Overall Auditory Comprehension: Appears within functional limits for tasks assessed Commands: Within Functional Limits Conversation: Simple Reading Comprehension Reading Status: Within funtional limits    Expression Expression Primary Mode of Expression: Verbal Verbal Expression Overall Verbal Expression: Appears within functional limits for tasks assessed Initiation: No impairment Automatic Speech: Name;Social Response Level of Generative/Spontaneous Verbalization: Conversation Naming: No impairment Pragmatics: No impairment Non-Verbal Means of Communication: Not applicable Written Expression Dominant Hand: Right Written Expression: Within Functional Limits   Oral / Motor  Oral Motor/Sensory Function Overall Oral Motor/Sensory Function: Within functional limits Motor Speech Overall Motor Speech: Appears within functional limits for tasks assessed Respiration: Within functional limits Phonation: Normal Resonance: Within functional limits Articulation: Within functional limitis Intelligibility: Intelligible Motor Planning: Witnin functional limits Motor Speech Errors: Not applicable   GO                   Zachary Flatten, MA, CCC-SLP Acute Rehab SLP [phone] Zachary [name] 03/19/2017, 10:09 AM

## 2017-03-19 NOTE — Progress Notes (Signed)
At start of shift, pt's MRI had resulted - stating micro and macro-hemorrhages were found. A stat CT was ordered via neurologist on at that time and a call was made to clarify w/ on-call neurology MD - CT was declared unnecessary at this time as pt is an NIH of 0 w/o complaints of new onset symptoms.  Upon assessing upcoming medications ordered for the night - found aspirin 325 (first dose) ordered at 2200. Called to clarify if pt should receive that medication based on MRI results. MD ordered to D/C 325 aspirin dose and order 81mg  aspirin dose instead as pt was not taking aspirin daily prior to hospital admission.   Will administer and continue to monitor closely.  Guadalupe Maple, RN

## 2017-03-19 NOTE — Progress Notes (Signed)
  Echocardiogram 2D Echocardiogram has been performed.  Zachary [name] 03/19/2017, 12:07 PM

## 2017-03-19 NOTE — Progress Notes (Signed)
PT Cancellation Note  Patient Details Name: KAMARON DESKINS MRN: [id number] DOB: [birth year]   Cancelled Treatment:    Reason Eval/Treat Not Completed: Patient s/p tPA 8/4 @ 17:30 with strict bedrest orders remaining.   Jeanie Cooks Lamari Beckles, PT 03/19/2017, 12:02 PM

## 2017-03-19 NOTE — Progress Notes (Signed)
Received call from neurologist who placed previous stat CT order - informed that he absolutely wanted scan to be done and to take pt down ASAP for that stat CT at 2245.   Stat CT scan order placed and pt was transported via 2 RN's downstairs and back up to the unit successfully.  Will continue to monitor closely.  Guadalupe Maple, RN

## 2017-03-19 NOTE — Progress Notes (Signed)
STROKE TEAM PROGRESS NOTE    Attending Addendum:  Acute onset of left side weakness, numbness, vertigo,and ataxia s/p IV tPA.  He is asymptomatic now and neurological exam is completely normal.  His BP is 165/105 but within the range for post IV tPA.  We will be more aggressive in controlling that after viewing his imaging.  MRI Brain pending.    Rogue Jury, MS, MD       HISTORY OF PRESENT ILLNESS (per record) Zachary Pagliarulo Shippis an 46 y.o.malewith a past medical history significant for obesity and hypertension who presents to [facility] with complaints of severe dizziness, difficulty walking and subsequent left sided numbness and tingling that begins at his neck. He states that he was laying down, watching TV when the episode began. He denies double vision, but endorses blurry vision, particularly out of the left eye, denies headache, weakness, slurred speech, aphasia and any other focal neurological deficit.   He states that he has not taken his blood pressure medication in approximately nine months because of financial reasons. He denies a personal or family history of stroke, hyperlipidemia, heart disease, smoking.  En route to the ED, his blood pressure was 238/136. It trended downwards to 176/109 immediately prior to TPA administration with Cleviprex titration and 20mg  labetalol total.  Date last known well: Date: 03/18/2017 Time last known well: Time: 13:30 tPA Given:Yes Saturday 03/18/17 at 1730 Modified Rankin Score: 0  MT: not a candidate   SUBJECTIVE (INTERVAL HISTORY) No complaints.     OBJECTIVE Temp:  [97.3 F (36.3 C)-98.5 F (36.9 C)] 97.9 F (36.6 C) (08/05 0800) Pulse Rate:  [68-115] 85 (08/05 0830) Cardiac Rhythm: Normal sinus rhythm (08/05 0800) Resp:  [14-35] 20 (08/05 0830) BP: (144-237)/(89-158) 164/107 (08/05 0830) SpO2:  [92 %-100 %] 98 % (08/05 0830) Weight:  [225 lb (102.1 kg)] 225 lb (102.1 kg) (08/04 1529)  CBC:   Recent Labs Lab  03/18/17 1537 03/18/17 1647  WBC 10.6*  --   NEUTROABS 4.0  --   HGB 13.8 14.3  HCT 39.7 42.0  MCV 80.0  --   PLT 378  --     Basic Metabolic Panel:   Recent Labs Lab 03/18/17 1537 03/18/17 1647  NA 136 139  K 3.4* 3.4*  CL 101 99*  CO2 26  --   GLUCOSE 148* 153*  BUN 13 17  CREATININE 1.24 1.10  CALCIUM 9.2  --     Lipid Panel:     Component Value Date/Time   CHOL 133 03/19/2017 0211   TRIG 67 03/19/2017 0211   HDL 40 (L) 03/19/2017 0211   CHOLHDL 3.3 03/19/2017 0211   VLDL 13 03/19/2017 0211   LDLCALC 80 03/19/2017 0211   HgbA1c: No results found for: HGBA1C Urine Drug Screen:     Component Value Date/Time   LABOPIA NONE DETECTED 03/18/2017 1802   COCAINSCRNUR NONE DETECTED 03/18/2017 1802   LABBENZ NONE DETECTED 03/18/2017 1802   AMPHETMU NONE DETECTED 03/18/2017 1802   THCU NONE DETECTED 03/18/2017 1802   LABBARB NONE DETECTED 03/18/2017 1802    Alcohol Level     Component Value Date/Time   ETH <5 03/18/2017 1537    IMAGING   Ct Angio Neck W Or Wo Contrast Ct Angio Head W Or Wo Contrast 03/18/2017 1. Negative for emergent large vessel occlusion.  2. Positive for moderate to severe stenosis of the distal Right vertebral artery, and Left PCA origin. Basilar artery irregularity also suggesting atherosclerosis, without significant stenosis.  3. Comparatively minimal atherosclerosis of the carotid arteries and anterior circulation.      Ct Head Code Stroke Wo Contrast 03/18/2017 1. Age advanced intracranial atherosclerosis and white matter disease, plus evidence of lacunar infarcts in the pons.  No acute cortically based infarct or acute intracranial hemorrhage identified.  2. ASPECTS is 10.    MRI Brain Wo Contrast - pending   Repeat CT Head Wo Contrast - pending    PHYSICAL EXAM  Awake, alert, fully oriented. EOMI, PERL. Face symmetric. Tongue midline. Strength 5/5 BUE and BLE. Coordination - FTN, HTS intact bilateral. Sensory-  intact bilateral. Gait - deferred.       ASSESSMENT/PLAN Zachary [name] is a 46 y.o. male with history of hypertension and obesity presenting with elevated blood pressure, severe dizziness, difficulty walking, blurred vision, and left-sided numbness. tPA Given:Saturday 03/18/17 at 1730   Stroke vs TIA:    Resultant  - No deficits now.  CT head -  evidence of lacunar infarcts in the pons. No acute cortically based infarct identified.  MRI head - pending  MRA head -  not performed  CTA H&N - moderate to severe stenosis of the distal Right vertebral artery, and Left PCA origin.  Carotid Doppler - CTA neck  2D Echo - pending  LDL - 80  HgbA1c - pending  VTE prophylaxis - SCDs Diet Heart Room service appropriate? Yes; Fluid consistency: Thin  No antithrombotic prior to admission, now on No antithrombotic - recent TPA therapy.  Patient counseled to be compliant with his antithrombotic medications  Ongoing aggressive stroke risk factor management  Therapy recommendations: pending  Disposition: Pending  Hypertension  Stable - blood pressure tends to run high  Permissive hypertension (OK if < 220/120) but gradually normalize in 5-7 days  Long-term BP goal normotensive  Hyperlipidemia  Home meds: No lipid-lowering medications prior to admission  LDL 80, goal < 70  Add Lipitor 20 mg daily  Continue statin at discharge   Other Stroke Risk Factors  Obesity, Body mass index is 32.28 kg/m., recommend weight loss, diet and exercise as appropriate   Hx stroke/TIA - by imaging   Other Active Problems  Hypokalemia - supplement and recheck  Hyperglycemia - await hemoglobin A1c  Treat hypertension  Hospital day # 1    To contact Stroke Continuity provider, please refer to [email or web address] After hours, contact General Neurology

## 2017-03-20 ENCOUNTER — Inpatient Hospital Stay (HOSPITAL_COMMUNITY): Payer: Medicaid Other

## 2017-03-20 DIAGNOSIS — R42 Dizziness and giddiness: Secondary | ICD-10-CM

## 2017-03-20 DIAGNOSIS — I6350 Cerebral infarction due to unspecified occlusion or stenosis of unspecified cerebral artery: Secondary | ICD-10-CM

## 2017-03-20 DIAGNOSIS — R209 Unspecified disturbances of skin sensation: Secondary | ICD-10-CM

## 2017-03-20 LAB — BASIC METABOLIC PANEL
Anion gap: 8 (ref 5–15)
BUN: 10 mg/dL (ref 6–20)
CHLORIDE: 100 mmol/L — AB (ref 101–111)
CO2: 25 mmol/L (ref 22–32)
CREATININE: 1.08 mg/dL (ref 0.61–1.24)
Calcium: 9.3 mg/dL (ref 8.9–10.3)
Glucose, Bld: 180 mg/dL — ABNORMAL HIGH (ref 65–99)
POTASSIUM: 3.6 mmol/L (ref 3.5–5.1)
SODIUM: 133 mmol/L — AB (ref 135–145)

## 2017-03-20 LAB — CBC
HEMATOCRIT: 45.6 % (ref 39.0–52.0)
Hemoglobin: 15.5 g/dL (ref 13.0–17.0)
MCH: 27.5 pg (ref 26.0–34.0)
MCHC: 34 g/dL (ref 30.0–36.0)
MCV: 81 fL (ref 78.0–100.0)
PLATELETS: 389 10*3/uL (ref 150–400)
RBC: 5.63 MIL/uL (ref 4.22–5.81)
RDW: 13.1 % (ref 11.5–15.5)
WBC: 10.7 10*3/uL — AB (ref 4.0–10.5)

## 2017-03-20 LAB — HEMOGLOBIN A1C
Hgb A1c MFr Bld: 8.1 % — ABNORMAL HIGH (ref 4.8–5.6)
Mean Plasma Glucose: 186 mg/dL

## 2017-03-20 MED ORDER — KETOROLAC TROMETHAMINE 15 MG/ML IJ SOLN
15.0000 mg | Freq: Four times a day (QID) | INTRAMUSCULAR | Status: DC | PRN
  Administered 2017-03-20 – 2017-03-21 (×6): 15 mg via INTRAVENOUS
  Filled 2017-03-20 (×6): qty 1

## 2017-03-20 MED ORDER — TOPIRAMATE 25 MG PO TABS
50.0000 mg | ORAL_TABLET | Freq: Two times a day (BID) | ORAL | Status: DC
  Administered 2017-03-20 – 2017-03-23 (×7): 50 mg via ORAL
  Filled 2017-03-20 (×8): qty 2

## 2017-03-20 MED ORDER — GADOBENATE DIMEGLUMINE 529 MG/ML IV SOLN
20.0000 mL | Freq: Once | INTRAVENOUS | Status: AC
  Administered 2017-03-20: 20 mL via INTRAVENOUS

## 2017-03-20 MED ORDER — CLOPIDOGREL BISULFATE 75 MG PO TABS
75.0000 mg | ORAL_TABLET | Freq: Every day | ORAL | Status: DC
  Administered 2017-03-20 – 2017-03-23 (×4): 75 mg via ORAL
  Filled 2017-03-20 (×4): qty 1

## 2017-03-20 MED ORDER — TOPIRAMATE 25 MG PO TABS
50.0000 mg | ORAL_TABLET | ORAL | Status: AC
  Administered 2017-03-20: 50 mg via ORAL
  Filled 2017-03-20: qty 2

## 2017-03-20 MED ORDER — AMLODIPINE BESYLATE 5 MG PO TABS
5.0000 mg | ORAL_TABLET | Freq: Every day | ORAL | Status: DC
  Administered 2017-03-20: 5 mg via ORAL
  Filled 2017-03-20: qty 1

## 2017-03-20 MED ORDER — HYDROCHLOROTHIAZIDE 25 MG PO TABS
25.0000 mg | ORAL_TABLET | Freq: Every day | ORAL | Status: DC
  Administered 2017-03-20 – 2017-03-23 (×4): 25 mg via ORAL
  Filled 2017-03-20 (×4): qty 1

## 2017-03-20 NOTE — Progress Notes (Signed)
During routine assessment, pt noted to have a drift in L arm with strength 4/5 and slightly weaker grip than previous assessment. Patient continues to report pain/numb/tingling on L side. NP notified. Patient then reported to RN that he feels like his blurred vision is coming back. NP also notified. Full NIH completed and will continue to monitor.

## 2017-03-20 NOTE — Procedures (Signed)
HPI:  46 y/o with dizziness, paresthesias, and R pontine infarct  TECHNICAL SUMMARY:  A multichannel referential and bipolar montage EEG using the standard international 10-20 system was performed on the patient described as awake, drowsy and asleep.  The dominant background activity consists of 10 hertz activity seen most prominantly over the posterior head region.  The backgound activity is reactive to eye opening and closing procedures.  Low voltage fast (beta) activity is distributed symmetrically and maximally over the anterior head regions.  ACTIVATION:  Stepwise photic stimulation and hyperventilation were not performed  EPILEPTIFORM ACTIVITY:  There were no spikes, sharp waves or paroxysmal activity.  SLEEP:  Stage I and stage II sleep architecture were identified  CARDIAC:  The EKG lead revealed a regular sinus rhythm.  IMPRESSION:  This is a normal EEG for the patients stated age.  There were no focal, hemispheric or lateralizing features.  No epileptiform activity was recorded.  A normal EEG does not exclude the diagnosis of a seizure disorder and if seizure remains high on the list of differential diagnosis, an ambulatory EEG may be of value.  Clinical correlation is required.

## 2017-03-20 NOTE — Progress Notes (Signed)
OT Cancellation Note  Patient Details Name: Zachary [name] MRN: [id number] DOB: 11-16-1970   Cancelled Treatment:    Reason Eval/Treat Not Completed: Patient not medically ready. Pt is on strict bedrest, will continue to follow for increased activity orders and eval as appropriate.  Almon Register [phone] 03/20/2017, 7:32 AM

## 2017-03-20 NOTE — Progress Notes (Signed)
PT Cancellation Note  Patient Details Name: Zachary [name] MRN: [id number] DOB: Apr 06, 1971   Cancelled Treatment:    Reason Eval/Treat Not Completed: Patient not medically ready. Will continue to monitor for medical appropriateness and updated activity orders prior to initiating PT eval.    Thelma Comp 03/20/2017, 7:19 AM   Rolinda Roan, PT, DPT Acute Rehabilitation Services Pager: [phone]

## 2017-03-20 NOTE — Progress Notes (Signed)
Pt c/o numbness and tingling starting on left side of his neck and radiating down towards his legs. He states it feels the same as when he came into the hospital. Patient is still able to move extremities, and is neuro intact. He c/o pain on left side like a "belt is tightening around me." Pt received prn pain medication with little relief. On call neurologist called, new orders received. Will continue to monitor. Lianne Bushy RN BSN.

## 2017-03-20 NOTE — Progress Notes (Signed)
Bedside EEG completed, results pending. 

## 2017-03-20 NOTE — Care Management Note (Signed)
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  PTA, pt independent, lives with wife, who is currently out of town.                  Action/Plan: Wife to return to town on Tuesday, per pt.  Will follow for discharge planning as pt progresses.  PT/OT consults pending.   Expected Discharge Date:                  Expected Discharge Plan:     In-House Referral:     Discharge planning Services  CM Consult  Post Acute Care Choice:    Choice offered to:     DME Arranged:    DME Agency:     HH Arranged:    HH Agency:     Status of Service:  In process, will continue to follow  If discussed at Long Length of Stay Meetings, dates discussed:    Additional Comments:  Reinaldo Raddle, RN, BSN  Trauma/Neuro ICU Case Manager [phone]

## 2017-03-20 NOTE — Progress Notes (Signed)
STROKE TEAM PROGRESS NOTE   SUBJECTIVE (INTERVAL HISTORY) Pt endorsing aching pain in c-spine and ongoing chest discomfort.  Troponin negative on 03/18/2017.  Started on topiramate, 50mg  BID.  Started on oral amlodipine and HCTZ for hypertension.  MRI Brain with Contrast with interval development of new acute infarct in the right paramedian inferior pons extending to the medulla.  EEG negative.        OBJECTIVE Temp:  [97.8 F (36.6 C)-98.4 F (36.9 C)] 98.2 F (36.8 C) (08/06 1200) Pulse Rate:  [88-114] 104 (08/06 1429) Cardiac Rhythm: Sinus tachycardia (08/06 0800) Resp:  [12-31] 28 (08/06 1330) BP: (148-189)/(90-120) 169/98 (08/06 1429) SpO2:  [89 %-100 %] 97 % (08/06 1429)  CBC:   Recent Labs Lab 03/18/17 1537 03/18/17 1647 03/20/17 0237  WBC 10.6*  --  10.7*  NEUTROABS 4.0  --   --   HGB 13.8 14.3 15.5  HCT 39.7 42.0 45.6  MCV 80.0  --  81.0  PLT 378  --  505    Basic Metabolic Panel:   Recent Labs Lab 03/18/17 1537 03/18/17 1647 03/20/17 0237  NA 136 139 133*  K 3.4* 3.4* 3.6  CL 101 99* 100*  CO2 26  --  25  GLUCOSE 148* 153* 180*  BUN 13 17 10   CREATININE 1.24 1.10 1.08  CALCIUM 9.2  --  9.3    Lipid Panel:     Component Value Date/Time   CHOL 133 03/19/2017 0211   TRIG 67 03/19/2017 0211   HDL 40 (L) 03/19/2017 0211   CHOLHDL 3.3 03/19/2017 0211   VLDL 13 03/19/2017 0211   LDLCALC 80 03/19/2017 0211   HgbA1c:  Lab Results  Component Value Date   HGBA1C 8.1 (H) 03/19/2017   Urine Drug Screen:     Component Value Date/Time   LABOPIA NONE DETECTED 03/18/2017 1802   COCAINSCRNUR NONE DETECTED 03/18/2017 1802   LABBENZ NONE DETECTED 03/18/2017 1802   AMPHETMU NONE DETECTED 03/18/2017 1802   THCU NONE DETECTED 03/18/2017 1802   LABBARB NONE DETECTED 03/18/2017 1802    Alcohol Level     Component Value Date/Time   ETH <5 03/18/2017 1537    IMAGING   Ct Angio Neck W Or Wo Contrast Ct Angio Head W Or Wo Contrast 03/18/2017 1. Negative  for emergent large vessel occlusion.  2. Positive for moderate to severe stenosis of the distal Right vertebral artery, and Left PCA origin. Basilar artery irregularity also suggesting atherosclerosis, without significant stenosis.  3. Comparatively minimal atherosclerosis of the carotid arteries and anterior circulation.   Ct Head Code Stroke Wo Contrast 03/18/2017 1. Age advanced intracranial atherosclerosis and white matter disease, plus evidence of lacunar infarcts in the pons.  No acute cortically based infarct or acute intracranial hemorrhage identified.  2. ASPECTS is 10.   MRI Brain Wo Contrast 03/19/2017 IMPRESSION: 1. Severe for age signal changes in the brain mostly consisting of numerous chronic micro-hemorrhages and patchy T2 and FLAIR hyperintensity suggesting advanced chronic small vessel disease. 2. More macroscopic hemorrhages in the central pons (see #4), and the mesial right temporal lobe, but both favored to be nonacute.  Still, in the setting of recent tPA recommend a Repeat Noncontrast Head CT to compare to that on 03/18/2017. 3. Questionable recent ischemia in the posterior right hippocampal formation. Otherwise no convincing evidence of acute infarct. 4. Widespread abnormal signal in the pons, in part related to chronic hemorrhages and chronic lacunar infarcts, but difficult to exclude acute pontine edema -  such as might be seen with osmotic demyelination.  Repeat CT Head Wo Contrast 03/19/2017 IMPRESSION: 1. No acute intracranial hemorrhage or other process identified.  Previously seen macroscopic and microscopic hemorrhages on prior MRI appear to be chronic in nature. 2. No other acute intracranial process identified. No definite evolving large vessel territory infarct. 3. Stable atrophy with chronic microvascular ischemic disease.  MRI Brain with Contrast 03/20/2017 IMPRESSION: Interval development of acute infarct in the right paramedian inferior pons extending to the  medulla. This is not identified yesterday.  Numerous areas of chronic hemorrhage throughout the brain as noted on the prior MRI.  Normal enhancement on today's study.  TTE 03/19/2017 Study Conclusions - Left ventricle: The cavity size was normal. Posterior wall thickness was increased in a pattern of mild LVH. Systolic function was normal. The estimated ejection fraction was in the range of 50% to 55%. Wall motion was normal; there were no regional wall motion abnormalities. The study is not technically sufficient to allow evaluation of LV diastolic function. Doppler parameters are consistent with indeterminate ventricular filling pressure. - Aortic valve: Transvalvular velocity was within the normal range.  There was no stenosis. There was no regurgitation. - Mitral valve: Transvalvular velocity was within the normal range.  There was no evidence for stenosis. There was no regurgitation. - Right ventricle: The cavity size was normal. Wall thickness was normal. Systolic function was normal. - Atrial septum: No defect or patent foramen ovale was identified by color flow Doppler. - Tricuspid valve: There was trivial regurgitation. - Pulmonary arteries: Systolic pressure was within the normal range. PA peak pressure: 18 mm Hg (S).    PHYSICAL EXAM Middle-age male currently not in distress. . Afebrile. Head is nontraumatic. Neck is supple without bruit.    Cardiac exam no murmur or gallop. Lungs are clear to auscultation. Distal pulses are well felt. Neurological Exam :  Awake, alert, fully oriented. EOMI, PERL. Face symmetric. Tongue midline. Strength 5/5 BUE and BLE. But effort is poor and variable with mild left shoulder weakness Coordination - FTN, HTS intact bilateral. Sensory- intact bilateral. Except for mild left lower facial decreased sensation. Gait - deferred.       ASSESSMENT/PLAN [name] is a 46 y.o. male with history of hypertension and obesity presenting with  elevated blood pressure, severe dizziness, difficulty walking, blurred vision, and left-sided numbness. tPA Given:Saturday 03/18/17 at 1730.   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    Resultant  - left facial numbness and mild left upper extremity weakness  CT head -  Evidence of lacunar infarcts in the pons. No acute cortically based infarct identified.  MRI head - Interval development of acute infarct in the right paramedian inferior pons extending to the medulla  MRA head - not ordered   CTA H&N - Moderate to severe stenosis of the distal Right vertebral artery, and Left PCA origin.  Carotid Doppler - CTA neck  2D Echo - EF 50-55%. No source of embolus  LDL - 80  HgbA1c - 8.1  VTE prophylaxis - SCDs Diet Heart Room service appropriate? Yes; Fluid consistency: Thin  No antithrombotic prior to admission, now on No antithrombotic - recent TPA therapy. Plan aspirin and Plavix for 3 months followed by Plavix alone  Patient counseled to be compliant with his antithrombotic medications  Ongoing aggressive stroke risk factor management  Therapy recommendations: pending  Disposition: Pending  Hypertension  Stable -  blood pressure tends to run high Permissive hypertension (OK if < 220/120) but gradually normalize in 5-7 days Long-term BP goal normotensive  Hyperlipidemia  Home meds: No lipid-lowering medications prior to admission  LDL 80, goal < 70  Add Lipitor 20 mg daily  Continue statin at discharge   Other Stroke Risk Factors  Obesity, Body mass index is 32.28 kg/m., recommend weight loss, diet and exercise as appropriate   Hx stroke/TIA - by imaging   Other Active Problems  Hypokalemia - supplement and recheck  Hyperglycemia - await hemoglobin A1c  Treat hypertension  Hospital day # 2  I have personally examined this patient, reviewed notes,  independently viewed imaging studies, participated in medical decision making and plan of care.ROS completed by me personally and pertinent positives fully documented  I have made any additions or clarifications directly to the above note. He has presented with right paramedian pontine infarct likely from symptomatic terminal right vertebral artery stenosis though he does have evidence of significant small vessel disease with microhemorrhages and old lacunar infarcts. Recommend dual antiplatelet therapy and aggressive risk factor modification. Wean Cardene drip and start oral blood pressure medications. Add Lipitor. Discussed with patient and answered questions . Start Topamax for neuropathic pain This patient is critically ill and at significant risk of neurological worsening, death and care requires constant monitoring of vital signs, hemodynamics,respiratory and cardiac monitoring, extensive review of multiple databases, frequent neurological assessment, discussion with family, other specialists and medical decision making of high complexity.I have made any additions or clarifications directly to the above note.This critical care time does not reflect procedure time, or teaching time or supervisory time of PA/NP/Med Resident etc but could involve care discussion time.  I spent 30 minutes of neurocritical care time  in the care of  this patient.     Antony Contras, MD Medical Director [facility] Pager: [phone] 03/20/2017 4:35 PM   To contact Stroke Continuity provider, please refer to [email or web address] After hours, contact General Neurology

## 2017-03-21 MED ORDER — AMLODIPINE BESYLATE 5 MG PO TABS
5.0000 mg | ORAL_TABLET | Freq: Every day | ORAL | Status: DC
  Administered 2017-03-21: 5 mg via ORAL
  Filled 2017-03-21: qty 1

## 2017-03-21 MED ORDER — AMLODIPINE BESYLATE 10 MG PO TABS: 10.0000 mg | ORAL_TABLET | Freq: Every day | ORAL | Status: DC

## 2017-03-21 NOTE — Evaluation (Addendum)
Physical Therapy Evaluation Patient Details Name: Zachary [name] MRN: [id number] DOB: [birth year] Today's Date: 03/21/2017   History of Present Illness  46 y.o. male with a past medical history significant for obesity and hypertension who presents to [facility] with complaints of severe dizziness, difficulty walking and subsequent left sided numbness and tingling that begins at his neck. He states that he was laying down, watching TV when the episode began. He denies double vision, but endorses blurry vision, particularly out of the left eye, MRI + acute infarct in the right paramedian pons. This is lateral and inferior to a large area of hemorrhage in the central pons. S/p tPA.   Clinical Impression  Patient presents with left hemiparesis, decreased sensation left side, impaired proprioception, decreased coordination, impaired motor planning/sequencing, and impaired mobility s/p above. Tolerated taking a few steps to get to chair with Max A of 2 due to left lateral lean, LLE ataxia and impaired balance. Encouraged pt to use left side as much as possible. Pt independent PTA and working 2 jobs. Would benefit from CIR to maximize independence and mobility prior to return home. Will follow acutely.    Follow Up Recommendations CIR    Equipment Recommendations  Other (comment) (defer)    Recommendations for Other Services Rehab consult     Precautions / Restrictions Precautions Precautions: Fall Precaution Comments: SBP <180 Restrictions Weight Bearing Restrictions: No      Mobility  Bed Mobility Overal bed mobility: Needs Assistance Bed Mobility: Sidelying to Sit;Rolling Rolling: Min guard Sidelying to sit: Min guard;HOB elevated       General bed mobility comments: Able to get to EOB without assist, increased time. Able to push through LUE with cues.   Transfers Overall transfer level: Needs assistance Equipment used: 2 person hand held assist Transfers: Sit to/from Stand Sit to Stand:  Min assist;+2 physical assistance Stand pivot transfers: Max assist;+2 physical assistance       General transfer comment: Assist to power to standing with cues for hand placement for WB through LUE/LE. Stood from [employer], from chair x1. No dizziness.   Ambulation/Gait Ambulation/Gait assistance: Max assist;+2 physical assistance Ambulation Distance (Feet): 4 Feet Assistive device: 2 person hand held assist Gait Pattern/deviations: Step-through pattern;Narrow base of support;Ataxic Gait velocity: decreased Gait velocity interpretation: Below normal speed for age/gender General Gait Details: Pt with heavy left lateral lean, difficulty sequencing movements for LE advancement. Left knee buckling during stance requiring support to prevent giving out. Ataxic like movement.   Stairs            Wheelchair Mobility    Modified Rankin (Stroke Patients Only) Modified Rankin (Stroke Patients Only) Pre-Morbid Rankin Score: No symptoms Modified Rankin: Moderately severe disability     Balance Overall balance assessment: Needs assistance Sitting-balance support: Feet supported Sitting balance-Leahy Scale: Fair Sitting balance - Comments: Able to sit EOB without support; right lateral lean bias. Attempts to self correct to midline but needs visual cues.  Postural control: Right lateral lean Standing balance support: During functional activity Standing balance-Leahy Scale: Poor Standing balance comment: Requires MAx A of 2 for dynamic standing balance; able to stand statically with Min guard assist.                              Pertinent Vitals/Pain Pain Assessment: Faces Faces Pain Scale: Hurts a little bit Pain Location: general discomfort Pain Descriptors / Indicators: Discomfort Pain Intervention(s): Monitored during session;Limited activity  within patient's tolerance    Home Living Family/patient expects to be discharged to:: Private residence Living Arrangements:  Spouse/significant other Available Help at Discharge: Family;Available 24 hours/day Type of Home: House Home Access: Level entry     Home Layout: Two level;Bed/bath upstairs Home Equipment: None      Prior Function Level of Independence: Independent         Comments: worked at [employer] in addition to second job     Journalist, newspaper   Dominant Hand: Right    Extremity/Trunk Assessment   Upper Extremity Assessment Upper Extremity Assessment: Defer to OT evaluation LUE Deficits / Details: AROM WFL; incoordination. Pt requires cues to use LUE at times. Able to complete functional movement patterns. Greater difficulty with fine motor skills. "clumsy hand" LUE Sensation: decreased light touch;decreased proprioception LUE Coordination: decreased fine motor;decreased gross motor    Lower Extremity Assessment Lower Extremity Assessment: LLE deficits/detail LLE Deficits / Details: Grossly ~3/5 throughout knee extensors, knee flexors, 2+/5 hip flexion, ankle DF. LLE Sensation: decreased light touch;decreased proprioception LLE Coordination: decreased fine motor;decreased gross motor    Cervical / Trunk Assessment Cervical / Trunk Assessment: Other exceptions Cervical / Trunk Exceptions: R bias  Communication   Communication: No difficulties  Cognition Arousal/Alertness: Awake/alert Behavior During Therapy: Flat affect Overall Cognitive Status: Within Functional Limits for tasks assessed                                 General Comments: For basic tasks; following commands well.       General Comments General comments (skin integrity, edema, etc.): Family left for PT evaluation. BP elevated 182/111 supine, 184/115 post activity.    Exercises     Assessment/Plan    PT Assessment Patient needs continued PT services  PT Problem List Decreased strength;Decreased mobility;Decreased balance;Pain;Impaired sensation;Decreased coordination       PT  Treatment Interventions Therapeutic activities;Gait training;Therapeutic exercise;Patient/family education;Balance training;Neuromuscular re-education;Functional mobility training;Stair training    PT Goals (Current goals can be found in the Care Plan section)  Acute Rehab PT Goals Patient Stated Goal: to return home with family PT Goal Formulation: With patient Time For Goal Achievement: 04/04/17 Potential to Achieve Goals: Good    Frequency Min 4X/week   Barriers to discharge        Co-evaluation PT/OT/SLP Co-Evaluation/Treatment: Yes Reason for Co-Treatment: For patient/therapist safety;To address functional/ADL transfers PT goals addressed during session: Mobility/safety with mobility;Balance         AM-PAC PT "6 Clicks" Daily Activity  Outcome Measure Difficulty turning over in bed (including adjusting bedclothes, sheets and blankets)?: None Difficulty moving from lying on back to sitting on the side of the bed? : None Difficulty sitting down on and standing up from a chair with arms (e.g., wheelchair, bedside commode, etc,.)?: Total Help needed moving to and from a bed to chair (including a wheelchair)?: A Lot Help needed walking in hospital room?: A Lot Help needed climbing 3-5 steps with a railing? : Total 6 Click Score: 14    End of Session Equipment Utilized During Treatment: Gait belt Activity Tolerance: Patient tolerated treatment well Patient left: in chair;with call bell/phone within reach;with chair alarm set;Other (comment) (OT present) Nurse Communication: Mobility status;Other (comment) (transfer back) PT Visit Diagnosis: Unsteadiness on feet (R26.81);Difficulty in walking, not elsewhere classified (R26.2);Muscle weakness (generalized) (M62.81);Hemiplegia and hemiparesis Hemiplegia - Right/Left: Left Hemiplegia - dominant/non-dominant: Non-dominant Hemiplegia - caused by: Cerebral infarction  Time: 4917-9150 PT Time Calculation (min) (ACUTE ONLY): 30  min   Charges:   PT Evaluation $PT Eval Moderate Complexity: 1 Mod     PT G CodesWray Kearns, PT, DPT [phone]    Marguarite Arbour A Alonnie Bieker 03/21/2017, 4:08 PM

## 2017-03-21 NOTE — Progress Notes (Signed)
Inpatient Diabetes Program Recommendations  AACE/ADA: New Consensus Statement on Inpatient Glycemic Control (2015)  Target Ranges:  Prepandial:   less than 140 mg/dL      Peak postprandial:   less than 180 mg/dL (1-2 hours)      Critically ill patients:  140 - 180 mg/dL   Lab Results  Component Value Date   HGBA1C 8.1 (H) 03/19/2017    Review of Glycemic Control- lab glucose  Results for NOAH, LEMBKE (MRN [id number]) as of 03/21/2017 11:50  Ref. Range 03/18/2017 17:49 03/18/2017 18:02 03/18/2017 18:33 03/19/2017 02:11 03/20/2017 02:37  Glucose Latest Ref Range: 65 - 99 mg/dL     180 (H)    Diabetes history: none noted- A1C 8.1% Outpatient Diabetes medications: none Current orders for Inpatient glycemic control: none  Inpatient Diabetes Program Recommendations:   Elevated A1C (6.5 or greater). Per ADA guidelines this meets the criteria of a diagnosis for diabetes. If appropriate, please consider placing this diagnosis in chart and inform patient of diagnosis.   Please order CBG tid and hs - consider Novolog 0-15 units tid, Novolog 0-5 units qhs  MD please discuss A1C with patient.  RN staff may begin diabetes education once the MD has discussed with patient.  Gentry Fitz, RN, BA, MHA, CDE Diabetes Coordinator Inpatient Diabetes Program  [phone] (Team Pager) [phone] ([place]) 03/21/2017 12:09 PM

## 2017-03-21 NOTE — Progress Notes (Signed)
STROKE TEAM PROGRESS NOTE   SUBJECTIVE (INTERVAL HISTORY) No acute events overnight.  The patient is awake, alert, and oriented.  On exam, pt was on clevedipine gtt for DBP > 163mmHg.  Continuing oral HCTZ and amlodipine.  Goal liberalized to SBP < 132mmHg.  Clevidipine off since 1030 today.  Pain improved with topiramate.  Orders in to transfer out of the ICU.    OBJECTIVE Temp:  [98 F (36.7 C)-98.2 F (36.8 C)] 98.1 F (36.7 C) (08/07 1200) Pulse Rate:  [79-119] 85 (08/07 1200) Cardiac Rhythm: Normal sinus rhythm (08/07 0600) Resp:  [14-34] 21 (08/07 1200) BP: (140-186)/(92-124) 154/102 (08/07 1200) SpO2:  [91 %-100 %] 94 % (08/07 1200)  CBC:   Recent Labs Lab 03/18/17 1537 03/18/17 1647 03/20/17 0237  WBC 10.6*  --  10.7*  NEUTROABS 4.0  --   --   HGB 13.8 14.3 15.5  HCT 39.7 42.0 45.6  MCV 80.0  --  81.0  PLT 378  --  765    Basic Metabolic Panel:   Recent Labs Lab 03/18/17 1537 03/18/17 1647 03/20/17 0237  NA 136 139 133*  K 3.4* 3.4* 3.6  CL 101 99* 100*  CO2 26  --  25  GLUCOSE 148* 153* 180*  BUN 13 17 10   CREATININE 1.24 1.10 1.08  CALCIUM 9.2  --  9.3    Lipid Panel:     Component Value Date/Time   CHOL 133 03/19/2017 0211   TRIG 67 03/19/2017 0211   HDL 40 (L) 03/19/2017 0211   CHOLHDL 3.3 03/19/2017 0211   VLDL 13 03/19/2017 0211   LDLCALC 80 03/19/2017 0211   HgbA1c:  Lab Results  Component Value Date   HGBA1C 8.1 (H) 03/19/2017   Urine Drug Screen:     Component Value Date/Time   LABOPIA NONE DETECTED 03/18/2017 1802   COCAINSCRNUR NONE DETECTED 03/18/2017 1802   LABBENZ NONE DETECTED 03/18/2017 1802   AMPHETMU NONE DETECTED 03/18/2017 1802   THCU NONE DETECTED 03/18/2017 1802   LABBARB NONE DETECTED 03/18/2017 1802    Alcohol Level     Component Value Date/Time   ETH <5 03/18/2017 1537    IMAGING   Ct Angio Neck W Or Wo Contrast Ct Angio Head W Or Wo Contrast 03/18/2017 1. Negative for emergent large vessel occlusion.   2. Positive for moderate to severe stenosis of the distal Right vertebral artery, and Left PCA origin. Basilar artery irregularity also suggesting atherosclerosis, without significant stenosis.  3. Comparatively minimal atherosclerosis of the carotid arteries and anterior circulation.   Ct Head Code Stroke Wo Contrast 03/18/2017 1. Age advanced intracranial atherosclerosis and white matter disease, plus evidence of lacunar infarcts in the pons.  No acute cortically based infarct or acute intracranial hemorrhage identified.  2. ASPECTS is 10.   MRI Brain Wo Contrast 03/19/2017 IMPRESSION: 1. Severe for age signal changes in the brain mostly consisting of numerous chronic micro-hemorrhages and patchy T2 and FLAIR hyperintensity suggesting advanced chronic small vessel disease. 2. More macroscopic hemorrhages in the central pons (see #4), and the mesial right temporal lobe, but both favored to be nonacute.  Still, in the setting of recent tPA recommend a Repeat Noncontrast Head CT to compare to that on 03/18/2017. 3. Questionable recent ischemia in the posterior right hippocampal formation. Otherwise no convincing evidence of acute infarct. 4. Widespread abnormal signal in the pons, in part related to chronic hemorrhages and chronic lacunar infarcts, but difficult to exclude acute pontine edema - such  as might be seen with osmotic demyelination.  Repeat CT Head Wo Contrast 03/19/2017 IMPRESSION: 1. No acute intracranial hemorrhage or other process identified.  Previously seen macroscopic and microscopic hemorrhages on prior MRI appear to be chronic in nature. 2. No other acute intracranial process identified. No definite evolving large vessel territory infarct. 3. Stable atrophy with chronic microvascular ischemic disease.  MRI Brain with Contrast 03/20/2017 IMPRESSION: Interval development of acute infarct in the right paramedian inferior pons extending to the medulla. This is not identified  yesterday.  Numerous areas of chronic hemorrhage throughout the brain as noted on the prior MRI.  Normal enhancement on today's study.  TTE 03/19/2017 Study Conclusions - Left ventricle: The cavity size was normal. Posterior wall thickness was increased in a pattern of mild LVH. Systolic function was normal. The estimated ejection fraction was in the range of 50% to 55%. Wall motion was normal; there were no regional wall motion abnormalities. The study is not technically sufficient to allow evaluation of LV diastolic function. Doppler parameters are consistent with indeterminate ventricular filling pressure. - Aortic valve: Transvalvular velocity was within the normal range.  There was no stenosis. There was no regurgitation. - Mitral valve: Transvalvular velocity was within the normal range.  There was no evidence for stenosis. There was no regurgitation. - Right ventricle: The cavity size was normal. Wall thickness was normal. Systolic function was normal. - Atrial septum: No defect or patent foramen ovale was identified by color flow Doppler. - Tricuspid valve: There was trivial regurgitation. - Pulmonary arteries: Systolic pressure was within the normal range. PA peak pressure: 18 mm Hg (S).    PHYSICAL EXAM Middle-age male currently not in distress. . Afebrile. Head is nontraumatic. Neck is supple without bruit.    Cardiac exam no murmur or gallop. Lungs are clear to auscultation. Distal pulses are well felt. Neurological Exam :  Awake, alert, fully oriented. EOMI, PERL. Face symmetric. Tongue midline. Strength 5/5 BUE and BLE. But effort is poor and variable with mild left shoulder weakness Coordination - FTN, HTS intact bilateral. Sensory- intact bilateral. Except for mild left lower facial decreased sensation. Gait - deferred.       ASSESSMENT/PLAN Zachary [name] is a 46 y.o. male with history of hypertension and obesity presenting with elevated blood pressure, severe  dizziness, difficulty walking, blurred vision, and left-sided numbness. tPA Given:Saturday 03/18/17 at 1730.   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    Resultant  - left facial numbness and mild left upper extremity weakness  CT head -  Evidence of lacunar infarcts in the pons. No acute cortically based infarct identified.  MRI head - Interval development of acute infarct in the right paramedian inferior pons extending to the medulla  MRA head - not ordered   CTA H&N - Moderate to severe stenosis of the distal Right vertebral artery, and Left PCA origin.  Carotid Doppler - CTA neck  2D Echo - EF 50-55%. No source of embolus  LDL - 80  HgbA1c - 8.1  VTE prophylaxis - SCDs Diet Heart Room service appropriate? Yes; Fluid consistency: Thin  No antithrombotic prior to admission, now on No antithrombotic - recent TPA therapy. Plan aspirin and Plavix for 3 months followed by Plavix alone  Patient counseled to be compliant with his antithrombotic medications  Ongoing aggressive stroke risk factor management  Therapy recommendations: pending    Disposition: Pending  Hypertension  Stable -  blood pressure tends to run high Permissive hypertension (OK if < 220/120) but gradually normalize in 5-7 days Long-term BP goal normotensive  Hyperlipidemia  Home meds: No lipid-lowering medications prior to admission  LDL 80, goal < 70  Add Lipitor 20 mg daily  Continue statin at discharge   Other Stroke Risk Factors  Obesity, Body mass index is 32.28 kg/m., recommend weight loss, diet and exercise as appropriate   Hx stroke/TIA - by imaging  Other Active Problems  Hypokalemia - supplement and recheck  Hyperglycemia - await hemoglobin A1c  Treat hypertension  Hospital day # 3  I have personally examined this patient, reviewed notes, independently viewed imaging studies,  participated in medical decision making and plan of care.ROS completed by me personally and pertinent positives fully documented  I have made any additions or clarifications directly to the above note. He has presented with right paramedian pontine infarct likely from symptomatic terminal right vertebral artery stenosis though he does have evidence of significant small vessel disease with microhemorrhages and old lacunar infarcts. Recommend dual antiplatelet therapy and aggressive risk factor modification. Wean Cardene drip and start oral blood pressure medications. Added Lipitor. Discussed with patient and answered questions. continue Topamax for neuropathic pain as it seems to be helping.   Transfer to neurology floor bed. Mobilize out of bed and therapy consults. Greater than 50% time during this 35 minute visit was spent on counseling and coordination of care about his pontine infarct and discussion about stroke evaluation, treatment and answering questions    Antony Contras, MD Medical Director [place] Pager: [phone] 03/21/2017 12:54 PM   To contact Stroke Continuity provider, please refer to [email or web address] After hours, contact General Neurology

## 2017-03-21 NOTE — Plan of Care (Signed)
Problem: Education: Goal: Knowledge of patient specific risk factors addressed and post discharge goals established will improve Outcome: Progressing Pt educated on the need to control BP to prevent further CVA

## 2017-03-21 NOTE — Progress Notes (Signed)
Rehab Admissions Coordinator Note:  Patient was screened by Retta Diones for appropriateness for an Inpatient Acute Rehab Consult.  Therapy evaluations are pending.  I will review in the am and then get rehab consult ordered if appropriate.  Retta Diones 03/21/2017, 3:58 PM  I can be reached at [phone].

## 2017-03-21 NOTE — Progress Notes (Signed)
Occupational Therapy Evaluation Patient Details Name: Zachary [name] MRN: [id number] DOB: 08-19-1970 Today's Date: 03/21/2017    History of Present Illness 46 y.o. male with a past medical history significant for obesity and hypertension who presents to [facility] with complaints of severe dizziness, difficulty walking and subsequent left sided numbness and tingling that begins at his neck. He states that he was laying down, watching TV when the episode began. He denies double vision, but endorses blurry vision, particularly out of the left eye, MRI + acute infarct in the right paramedian pons. This is lateral and inferior to a large area of hemorrhage in the central pons..    Clinical Impression   PTA, pt independent with all ADL and mobility and worked full time. Pt presents with significant decline in functional status, requiring Max A +2 with ambulation and mod A with ADL. Pt very motivated to return to PLOF and has very supportive family. Will benefit form acute OT to address established goals and facilitate safe DC to next venue of care. Pt is an excellent CIR candidate.     Follow Up Recommendations  CIR;Supervision/Assistance - 24 hour    Equipment Recommendations  3 in 1 bedside commode;Tub/shower bench    Recommendations for Other Services Rehab consult     Precautions / Restrictions Precautions Precautions: Fall Precaution Comments: SBP <180 Restrictions Weight Bearing Restrictions: No      Mobility Bed Mobility Overal bed mobility: Needs Assistance Bed Mobility: Sidelying to Sit;Rolling Rolling: Min guard Sidelying to sit: Min guard;HOB elevated       General bed mobility comments: Able to get to EOB without assist, increased time. Able to push through LUE with cues.   Transfers Overall transfer level: Needs assistance Equipment used: 2 person hand held assist Transfers: Sit to/from Stand Sit to Stand: Min assist;+2 physical assistance Stand pivot transfers: Max  assist;+2 physical assistance       General transfer comment: Assist to power to standing with cues for hand placement for WB through LUE/LE. Stood from [employer], from chair x1. No dizziness.     Balance Overall balance assessment: Needs assistance Sitting-balance support: Feet supported Sitting balance-Leahy Scale: Fair Sitting balance - Comments: Able to sit EOB without support; right lateral lean bias. Attempts to self correct to midline but needs visual cues.  Postural control: Right lateral lean Standing balance support: During functional activity Standing balance-Leahy Scale: Poor Standing balance comment: Requires MAx A of 2 for dynamic standing balance; able to stand statically with Min guard assist.                            ADL either performed or assessed with clinical judgement   ADL Overall ADL's : Needs assistance/impaired Eating/Feeding: Set up   Grooming: Set up;Sitting   Upper Body Bathing: Set up;Supervision/ safety;Sitting   Lower Body Bathing: Moderate assistance;Sit to/from stand   Upper Body Dressing : Moderate assistance;Sitting   Lower Body Dressing: Moderate assistance;Sit to/from stand   Toilet Transfer: +2 for physical assistance;Moderate assistance;Stand-pivot   Toileting- Clothing Manipulation and Hygiene: Moderate assistance       Functional mobility during ADLs: Maximal assistance;+2 for physical assistance (ambulating 5 feet)       Vision Baseline Vision/History: No visual deficits Patient Visual Report: Blurring of vision Vision Assessment?: Vision impaired- to be further tested in functional context Additional Comments: no complaints of double vision     Perception Perception Comments: will further assess  Praxis Praxis Praxis-Other Comments: appears intact    Pertinent Vitals/Pain Pain Assessment: Faces Faces Pain Scale: Hurts a little bit Pain Location: general discomfort Pain Descriptors / Indicators:  Discomfort Pain Intervention(s): Monitored during session;Limited activity within patient's tolerance     Hand Dominance Right   Extremity/Trunk Assessment Upper Extremity Assessment Upper Extremity Assessment: Defer to OT evaluation LUE Deficits / Details: AROM WFL; incoordination. Pt requires cues to use LUE at times. Able to complete functional movement patterns. Greater difficulty with fine motor skills. "clumsy hand" LUE Sensation: decreased light touch;decreased proprioception LUE Coordination: decreased fine motor;decreased gross motor   Lower Extremity Assessment Lower Extremity Assessment: LLE deficits/detail LLE Deficits / Details: Grossly ~3/5 throughout knee extensors, knee flexors, 2+/5 hip flexion, ankle DF. LLE Sensation: decreased light touch;decreased proprioception LLE Coordination: decreased fine motor;decreased gross motor   Cervical / Trunk Assessment Cervical / Trunk Assessment: Other exceptions Cervical / Trunk Exceptions: R bias   Communication Communication Communication: No difficulties   Cognition Arousal/Alertness: Awake/alert Behavior During Therapy: Flat affect Overall Cognitive Status: Within Functional Limits for tasks assessed                                 General Comments: For basic tasks; following commands well.    General Comments  Family left for PT evaluation. BP elevated 182/111 supine, 184/115 post activity.    Exercises     Shoulder Instructions      Home Living Family/patient expects to be discharged to:: Private residence Living Arrangements: Spouse/significant other Available Help at Discharge: Family;Available 24 hours/day Type of Home: House Home Access: Level entry     Home Layout: Two level;Bed/bath upstairs Alternate Level Stairs-Number of Steps: 1 flight   Bathroom Shower/Tub: Teacher, early years/pre: Standard     Home Equipment: None          Prior Functioning/Environment Level  of Independence: Independent        Comments: worked at [employer] in addition to second job        OT Problem List: Decreased strength;Decreased activity tolerance;Impaired balance (sitting and/or standing);Impaired vision/perception;Decreased coordination;Decreased safety awareness;Decreased knowledge of use of DME or AE;Decreased knowledge of precautions;Cardiopulmonary status limiting activity;Impaired sensation;Obesity;Impaired UE functional use      OT Treatment/Interventions: Self-care/ADL training;Therapeutic exercise;Neuromuscular education;DME and/or AE instruction;Therapeutic activities;Cognitive remediation/compensation;Visual/perceptual remediation/compensation;Patient/family education;Balance training    OT Goals(Current goals can be found in the care plan section) Acute Rehab OT Goals Patient Stated Goal: to return home with family OT Goal Formulation: With patient Time For Goal Achievement: 04/04/17 Potential to Achieve Goals: Good ADL Goals Pt Will Perform Lower Body Bathing: with min guard assist;sit to/from stand Pt Will Perform Upper Body Dressing: with set-up;sitting Pt Will Perform Lower Body Dressing: with min guard assist;sit to/from stand Pt Will Transfer to Toilet: with supervision;bedside commode;squat pivot transfer Pt Will Perform Toileting - Clothing Manipulation and hygiene: with supervision;sitting/lateral leans  OT Frequency: Min 3X/week   Barriers to D/C:            Co-evaluation PT/OT/SLP Co-Evaluation/Treatment: Yes Reason for Co-Treatment: Complexity of the patient's impairments (multi-system involvement);For patient/therapist safety;To address functional/ADL transfers PT goals addressed during session: Mobility/safety with mobility;Balance OT goals addressed during session: ADL's and self-care      AM-PAC PT "6 Clicks" Daily Activity     Outcome Measure Help from another person eating meals?: None Help from another person taking care  of personal grooming?:  None Help from another person toileting, which includes using toliet, bedpan, or urinal?: A Lot Help from another person bathing (including washing, rinsing, drying)?: A Lot Help from another person to put on and taking off regular upper body clothing?: A Lot Help from another person to put on and taking off regular lower body clothing?: A Lot 6 Click Score: 16   End of Session Equipment Utilized During Treatment: Gait belt Nurse Communication: Mobility status  Activity Tolerance: Patient tolerated treatment well Patient left: in chair;with call bell/phone within reach;with chair alarm set  OT Visit Diagnosis: Other abnormalities of gait and mobility (R26.89);Muscle weakness (generalized) (M62.81);Ataxia, unspecified (R27.0)                Time: 1431-1456 OT Time Calculation (min): 25 min Charges:  OT General Charges $OT Visit: 1 Procedure OT Evaluation $OT Eval High Complexity: 1 Procedure G-Codes:     Raul Torrance, OT/L  [phone] 03/21/2017  Arvis Zwahlen,HILLARY 03/21/2017, 4:15 PM

## 2017-03-22 DIAGNOSIS — G8194 Hemiplegia, unspecified affecting left nondominant side: Secondary | ICD-10-CM

## 2017-03-22 DIAGNOSIS — I638 Other cerebral infarction: Secondary | ICD-10-CM

## 2017-03-22 MED ORDER — AMLODIPINE BESYLATE 10 MG PO TABS
10.0000 mg | ORAL_TABLET | Freq: Every day | ORAL | Status: DC
  Administered 2017-03-22 – 2017-03-23 (×2): 10 mg via ORAL
  Filled 2017-03-22 (×2): qty 1

## 2017-03-22 MED ORDER — LIVING WELL WITH DIABETES BOOK
Freq: Once | Status: DC
  Filled 2017-03-22: qty 1

## 2017-03-22 MED ORDER — METFORMIN HCL 500 MG PO TABS
500.0000 mg | ORAL_TABLET | Freq: Two times a day (BID) | ORAL | Status: DC
  Administered 2017-03-22 – 2017-03-23 (×3): 500 mg via ORAL
  Filled 2017-03-22 (×3): qty 1

## 2017-03-22 NOTE — Plan of Care (Signed)
Problem: Education: Goal: Ability to describe self-care measures that may prevent or decrease complications (Diabetes Survival Skills Education) will improve Outcome: Adequate for Discharge Patient to be d/c'd on oral DM medication Medication discussed information attached to d/c paperwork Patient to view DM videos Discussed glucose monitoring when, how, and why Discussed meal and beverage options Discussed exercise in relation to physical limitations and rehab Discussed hypo and hyperglycemia s/s of both and treatment of both Living Well w/diabetes book ordered, Nursing staff to give to patient  Problem: Health Behavior: Goal: Ability to manage health-related needs will improve Outcome: Completed/Met Date Met: 03/22/17 Discussed w/pt close follow up with PCP and A1c level monitoring   Problem: Nutritional: Goal: Maintenance of adequate nutrition will improve Outcome: Completed/Met Date Met: 03/22/17 Discussed food and beverage choices and options  Problem: Physical Regulation: Goal: Diagnostic test results will improve Outcome: Completed/Met Date Met: 03/22/17 Patient knows his A1c level

## 2017-03-22 NOTE — Progress Notes (Signed)
Inpatient Diabetes Program Recommendations  AACE/ADA: New Consensus Statement on Inpatient Glycemic Control (2015)  Target Ranges:  Prepandial:   less than 140 mg/dL      Peak postprandial:   less than 180 mg/dL (1-2 hours)      Critically ill patients:  140 - 180 mg/dL   Spoke with patient about new diabetes diagnosis.  Discussed A1C results (8.1% this admission) and explained what an A1C is. Discussed basic pathophysiology of DM Type 2, basic home care, importance of checking CBGs and maintaining good CBG control to prevent long-term and short-term complications. Reviewed glucose and A1C goals.  Reviewed signs and symptoms of hyperglycemia and hypoglycemia along with treatment for both. Discussed impact of nutrition, exercise, stress, sickness, and medications on diabetes control. Discussed carbohydrates, carbohydrate goals per day and meal, along with portion sizes. Reviewed Living Well with diabetes booklet and encouraged patient to read through entire book. Metformin information attached in exitcare for d/c.  Asked patient to check his glucose 2 times per day. Patient verbalized understanding of information discussed and he states that he has no further questions at this time related to diabetes. RNs to provide ongoing basic DM education at bedside with this patient and engage patient to actively check blood glucose.   Thanks, Tama Headings RN, MSN, [facility] Inpatient Diabetes Coordinator Team Pager [phone] (8a-5p)

## 2017-03-22 NOTE — Discharge Summary (Signed)
Stroke Discharge Summary  Patient ID: Zachary [name]   MRN: [id number]      DOB: 07/29/1971  Date of Admission: 03/18/2017 Date of Discharge: 03/22/2017  Attending Physician:  Garvin Fila, MD, Stroke MD Consultant(s): None Patient's PCP:  No primary care provider on file.  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  Past Medical History:  Diagnosis Date  . Hypertension    History reviewed. No pertinent surgical history.  Medications to be continued on Rehab . amLODipine  10 mg Oral Daily  . aspirin EC  81 mg Oral QHS  . atorvastatin  20 mg Oral q1800  . clopidogrel  75 mg Oral Daily  . hydrochlorothiazide  25 mg Oral Daily  . pantoprazole  40 mg Oral QHS  . topiramate  50 mg Oral BID    LABORATORY STUDIES CBC    Component Value Date/Time   WBC 10.7 (H) 03/20/2017 0237   RBC 5.63 03/20/2017 0237   HGB 15.5 03/20/2017 0237   HCT 45.6 03/20/2017 0237   PLT 389 03/20/2017 0237   MCV 81.0 03/20/2017 0237   MCH 27.5 03/20/2017 0237   MCHC 34.0 03/20/2017 0237   RDW 13.1 03/20/2017 0237   LYMPHSABS 5.5 (H) 03/18/2017 1537   MONOABS 0.7 03/18/2017 1537   EOSABS 0.3 03/18/2017 1537   BASOSABS 0.0 03/18/2017 1537   CMP    Component Value Date/Time   NA 133 (L) 03/20/2017 0237   K 3.6 03/20/2017 0237   CL 100 (L) 03/20/2017 0237   CO2 25 03/20/2017 0237   GLUCOSE 180 (H) 03/20/2017 0237   BUN 10 03/20/2017 0237   CREATININE 1.08 03/20/2017 0237   CALCIUM 9.3 03/20/2017 0237   PROT 6.9 03/18/2017 1537   ALBUMIN 3.8 03/18/2017 1537   AST 22 03/18/2017 1537   ALT 16 (L) 03/18/2017 1537   ALKPHOS 73 03/18/2017 1537   BILITOT 0.8 03/18/2017 1537   GFRNONAA >60 03/20/2017 0237   GFRAA >60 03/20/2017 0237   COAGS Lab Results  Component Value Date   INR 0.96 03/18/2017   Lipid Panel    Component  Value Date/Time   CHOL 133 03/19/2017 0211   TRIG 67 03/19/2017 0211   HDL 40 (L) 03/19/2017 0211   CHOLHDL 3.3 03/19/2017 0211   VLDL 13 03/19/2017 0211   LDLCALC 80 03/19/2017 0211   HgbA1C  Lab Results  Component Value Date   HGBA1C 8.1 (H) 03/19/2017   Urinalysis    Component Value Date/Time   COLORURINE STRAW (A) 03/18/2017 1802   APPEARANCEUR CLEAR (A) 03/18/2017 1802   LABSPEC 1.020 03/18/2017 1802   PHURINE 7.0 03/18/2017 1802   GLUCOSEU 50 (A) 03/18/2017 1802   HGBUR SMALL (A) 03/18/2017 1802   BILIRUBINUR NEGATIVE 03/18/2017 1802   KETONESUR NEGATIVE 03/18/2017 1802   PROTEINUR 100 (A) 03/18/2017 1802   NITRITE NEGATIVE 03/18/2017 1802   LEUKOCYTESUR NEGATIVE 03/18/2017 1802   Urine Drug Screen     Component Value Date/Time   LABOPIA NONE DETECTED 03/18/2017 1802   COCAINSCRNUR NONE DETECTED 03/18/2017 1802   LABBENZ NONE DETECTED 03/18/2017 1802   AMPHETMU NONE DETECTED 03/18/2017 1802   THCU NONE DETECTED 03/18/2017 1802   LABBARB NONE DETECTED 03/18/2017 1802    Alcohol Level    Component Value Date/Time   ETH <5 03/18/2017 1537     SIGNIFICANT  DIAGNOSTIC STUDIES Ct Angio Neck W Or Wo Contrast Ct Angio Head W Or Wo Contrast 03/18/2017 1. Negative for emergent large vessel occlusion.  2. Positive for moderate to severe stenosis of the distal Right vertebral artery, and Left PCA origin. Basilar artery irregularity also suggesting atherosclerosis, without significant stenosis.  3. Comparatively minimal atherosclerosis of the carotid arteries and anterior circulation.   Ct Head Code Stroke Wo Contrast 03/18/2017 1. Age advanced intracranial atherosclerosis and white matter disease, plus evidence of lacunar infarcts in the pons.  No acute cortically based infarct or acute intracranial hemorrhage identified.  2. ASPECTS is 10.   MRI Brain Wo Contrast 03/19/2017 IMPRESSION: 1. Severe for age signal changes in the brain mostly consisting of numerous  chronic micro-hemorrhages and patchy T2 and FLAIR hyperintensity suggesting advanced chronic small vessel disease. 2. More macroscopic hemorrhages in the central pons (see #4), and the mesial right temporal lobe, but both favored to be nonacute.  Still, in the setting of recent tPA recommend a Repeat Noncontrast Head CT to compare to that on 03/18/2017. 3. Questionable recent ischemia in the posterior right hippocampal formation. Otherwise no convincing evidence of acute infarct. 4. Widespread abnormal signal in the pons, in part related to chronic hemorrhages and chronic lacunar infarcts, but difficult to exclude acute pontine edema - such as might be seen with osmotic demyelination.  Repeat CT Head Wo Contrast 03/19/2017 IMPRESSION: 1. No acute intracranial hemorrhage or other process identified.  Previously seen macroscopic and microscopic hemorrhages on prior MRI appear to be chronic in nature. 2. No other acute intracranial process identified. No definite evolving large vessel territory infarct. 3. Stable atrophy with chronic microvascular ischemic disease.  MRI Brain with Contrast 03/20/2017 IMPRESSION: Interval development of acute infarct in the right paramedian inferior pons extending to the medulla. This is not identified yesterday.  Numerous areas of chronic hemorrhage throughout the brain as noted on the prior MRI.  Normal enhancement on today's study.  TTE 03/19/2017 Study Conclusions - Left ventricle: The cavity size was normal. Posterior wall thickness was increased in a pattern of mild LVH. Systolic function was normal. The estimated ejection fraction was in therange of 50% to 55%. Wall motion was normal; there were no regional wall motion abnormalities. The study is not technically sufficient to allow evaluation of LV diastolic function. Doppler parameters are consistent with indeterminate ventricular filling pressure. - Aortic valve: Transvalvular velocity was within the normal  range.  There was no stenosis. There was no regurgitation. - Mitral valve: Transvalvular velocity was within the normal range.  There was no evidence for stenosis. There was no regurgitation. - Right ventricle: The cavity size was normal. Wall thickness was normal. Systolic function was normal. - Atrial septum: No defect or patent foramen ovale was identified by color flow Doppler. - Tricuspid valve: There was trivial regurgitation. - Pulmonary arteries: Systolic pressure was within the normal range. PA peak pressure: 18 mm Hg (S).    HISTORY OF PRESENT ILLNESS Zachary [name] an 46 y.o.malewith a past medical history significant for obesity and hypertension who presents to [facility] with complaints of severe dizziness, difficulty walking and subsequent left sided numbness and tingling that begins at his neck. He states that he was laying down, watching TV when the episode began. He denies double vision, but endorses blurry vision, particularly out of the left eye, denies headache, weakness, slurred speech, aphasia and any other focal neurological deficit.  He states that he has not taken his blood pressure medication  in approximately nine months because of financial reasons. He denies a personal or family history of stroke, hyperlipidemia, heart disease, smoking.  En route to the ED, his blood pressure was 238/136. It trended downwards to 176/109 immediately prior to TPA administration with Cleviprex titration and 20mg  labetalol total.  Date last known well: Date: 03/18/2017 Time last known well: Time: 13:30 tPA Given:Yes Modified Rankin Score: 0  MT: not a candidate  Pertinent Imaging: CT head 03/18/17: Age advanced intracranial atherosclerosis and white matter disease. Lacunar infarcts in the pons. No acute abnormality. CTA head/neck 03/18/17: Negative for LVO. Moderate to severe stenosis of the distal Right vertebral artery, and Left PCA origin. Basilar artery irregularity also suggesting  atherosclerosis, without significant stenosis.   HOSPITAL COURSE Zachary [name] is a 46 y.o. male with history of hypertension and obesity presenting with elevated blood pressure, severe dizziness, difficulty walking, blurred vision, and left-sided numbness. tPA Given:Saturday 03/18/17 at 1730. he was admitted to the intensive care unit and blood pressure was tightly controlled requiring IV the cart been drip for a few days. He complained of left upper extremity and body pain and paresthesias for which he was started on Topamax which helped. Initial MRI on admission did not show convincing evidence of new stroke but repeat MRI 2 days later showed new para median pontine infarct he was seen by physical and occupational therapy who recommended inpatient rehabilitation and he was transferred there in a stable condition  Right paramedian pontine infarct likely secondary to small vessel disease versus symptomatic terminal right vertebral artery stenosis.    Resultant  - left facial numbness and mild left upper extremity weakness  CT head -  Evidence of lacunar infarcts in the pons. No acute cortically based infarct identified.  MRI head - Interval development of acute infarct in the right paramedian inferior pons extending to the medulla  MRA head - not ordered   CTA H&N - Moderate to severe stenosis of the distal Right vertebral artery, and Left PCA origin.  Carotid Doppler - CTA neck  2D Echo - EF 50-55%. No source of embolus  LDL - 80  HgbA1c - 8.1 - started on metformin 500 PO BID with meals, followed by diabetes coordinator  VTE prophylaxis - SCDs  Diet Heart Room service appropriate? Yes; Fluid consistency: Thin  No antithrombotic prior to admission, now on No antithrombotic - recent TPA therapy. Plan aspirin and Plavix for 3 months followed by Plavix alone  Patient counseled to be compliant with his antithrombotic medications  Ongoing aggressive stroke risk factor  management  Therapy recommendations: pending    Disposition: Pending  Hypertension  Stable - blood pressure tends to run high  Permissive hypertension (OK if < 220/120) but gradually normalize in 5-7 days  Long-term BP goal normotensive  Hyperlipidemia  Home meds: No lipid-lowering medications prior to admission  LDL 80, goal < 70  Add Lipitor 20 mg daily  Continue statin at discharge   Other Stroke Risk Factors  Obesity, Body mass index is 32.28 kg/m., recommend weight loss, diet and exercise as appropriate   Hx stroke/TIA - by imaging  Other Active Problems  Hypokalemia - supplement and recheck  Hyperglycemia - await hemoglobin A1c  Treat hypertension  DISCHARGE EXAM Blood pressure (!) 162/105, pulse 81, temperature 97.6 F (36.4 C), temperature source Oral, resp. rate (!) 24, height 5\' 10"  (1.778 m), weight 102.1 kg (225 lb), SpO2 94 %. Middle-age male currently not in distress.  Afebrile. Head is  nontraumatic. Neck is supple without bruit.  Cardiac exam no murmur or gallop. Lungs are clear to auscultation. Distal pulses are well felt. Neurological Exam:  Awake, alert, fully oriented. EOMI, PERL. Face symmetric. Tongue midline. Strength 5/5 BUE and BLE.  But effort is poor and variable with mild left shoulder weakness Coordination - FTN, HTS intact bilateral. Sensory- intact bilateral.  Except for mild left lower facial decreased sensation. Gait - deferred.  Discharge Diet  Diet Heart Room service appropriate? Yes; Fluid consistency: Thin liquids  DISCHARGE PLAN  Disposition:  Transfer to [place] for ongoing PT, OT and ST  aspirin 81 mg daily and clopidogrel 75 mg daily for secondary stroke prevention.  Recommend ongoing risk factor control by Primary Care Physician at time of discharge from inpatient rehabilitation.  Follow-up No primary care provider on file. in 2 weeks following discharge from rehab.  Follow-up with Dr.  Antony Contras, Stroke Clinic in 6 weeks, office to schedule an appointment.   78minutes were spent preparing discharge.  I have personally examined this patient, reviewed notes, independently viewed imaging studies, participated in medical decision making and plan of care.ROS completed by me personally and pertinent positives fully documented  I have made any additions or clarifications directly to the above note. Agree with note above.  Antony Contras, MD Medical Director [facility] Pager: [phone] 03/22/2017 5:58 PM

## 2017-03-22 NOTE — Consult Note (Signed)
Physical Medicine and Rehabilitation Consult Reason for Consult: Left side weakness Referring Physician: Dr. Leonie Man   HPI: Zachary [name] is a 46 y.o. right handed male with reported history of obesity, hypertension. Patient has not taken his blood pressure medication in approximately 9 months because of financial reasons. Blood pressure 238/136 and placed on cleviprex and labetalol. Per chart review patient lives with spouse and 2 children ages 82 and 70. Independent prior to admission and works at [employer] in addition to a second job. 2 level home with bedroom upstairs. Wife works during the day. He presented 03/18/2017 with severe dizziness, left-sided weakness and numbness. Cranial CT scan negative. Patient did receive TPA. MRI showed acute infarction right paramedian inferior pons extending into the medulla. 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 UDS negative. CT angio head and neck negative for emergent large vessel occlusion. 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 Echocardiogram with ejection fraction of 55% no wall motion abnormalities. EEG negative for seizure. Neurology consulted currently on aspirin and Plavix for CVA prophylaxis. Findings of elevated hemoglobin A1c 8.1 with diabetic coordinator consulted. Tolerating a regular diet. Physical and occupational therapy evaluations completed with recommendations of physical medicine rehabilitation consult.   Review of Systems  Constitutional: Negative for chills and fever.  HENT: Negative for hearing loss.   Eyes: Negative for blurred vision and double vision.  Respiratory: Negative for cough and shortness of breath.   Cardiovascular: Negative for chest pain, palpitations and leg swelling.  Gastrointestinal: Positive for constipation. Negative for nausea and vomiting.  Genitourinary: Negative for dysuria, flank pain and hematuria.  Musculoskeletal:  Positive for myalgias.  Skin: Negative for rash.  Neurological: Positive for dizziness, sensory change and weakness. Negative for seizures.  All other systems reviewed and are negative.  Past Medical History:  Diagnosis Date  . Hypertension    History reviewed. No pertinent surgical history. History reviewed. No pertinent family history. Social History:  reports that he has never smoked. He has never used smokeless tobacco. He reports that he does not drink alcohol or use drugs. Allergies: No Known Allergies Medications Prior to Admission  Medication Sig Dispense Refill  . Aspirin-Acetaminophen-Caffeine (EXCEDRIN PO) Take 2 tablets by mouth as needed (Headache/Migraine).      Home: Home Living Family/patient expects to be discharged to:: Private residence Living Arrangements: Spouse/significant other Available Help at Discharge: Family, Available 24 hours/day Type of Home: House Home Access: Level entry Home Layout: Two level, Bed/bath upstairs Alternate Level Stairs-Number of Steps: 1 flight Bathroom Shower/Tub: Chiropodist: Standard Home Equipment: None  Functional History: Prior Function Level of Independence: Independent Comments: worked at [employer] in addition to second job Functional Status:  Mobility: Bed Mobility Overal bed mobility: Needs Assistance Bed Mobility: Sidelying to Sit, Rolling Rolling: Min guard Sidelying to sit: Min guard, HOB elevated General bed mobility comments: Able to get to EOB without assist, increased time. Able to push through LUE with cues.  Transfers Overall transfer level: Needs assistance Equipment used: 2 person hand held assist Transfers: Sit to/from Stand Sit to Stand: Min assist, +2 physical assistance Stand pivot transfers: Max assist, +2 physical assistance General transfer comment: Assist to power to standing with cues for hand placement for WB through LUE/LE. Stood from [employer], from chair x1. No  dizziness.  Ambulation/Gait Ambulation/Gait assistance: Max assist, +2 physical assistance Ambulation Distance (Feet): 4 Feet Assistive device: 2 person  hand held assist Gait Pattern/deviations: Step-through pattern, Narrow base of support, Ataxic General Gait Details: Pt with heavy left lateral lean, difficulty sequencing movements for LE advancement. Left knee buckling during stance requiring support to prevent giving out. Ataxic like movement.  Gait velocity: decreased Gait velocity interpretation: Below normal speed for age/gender    ADL: ADL Overall ADL's : Needs assistance/impaired Eating/Feeding: Set up Grooming: Set up, Sitting Upper Body Bathing: Set up, Supervision/ safety, Sitting Lower Body Bathing: Moderate assistance, Sit to/from stand Upper Body Dressing : Moderate assistance, Sitting Lower Body Dressing: Moderate assistance, Sit to/from stand Toilet Transfer: +2 for physical assistance, Moderate assistance, Stand-pivot Toileting- Clothing Manipulation and Hygiene: Moderate assistance Functional mobility during ADLs: Maximal assistance, +2 for physical assistance (ambulating 5 feet)  Cognition: Cognition Overall Cognitive Status: Within Functional Limits for tasks assessed Arousal/Alertness: Awake/alert Orientation Level: Oriented X4 Attention: Sustained Sustained Attention: Appears intact Memory: Appears intact Awareness: Appears intact Problem Solving: Appears intact Safety/Judgment: Appears intact Cognition Arousal/Alertness: Awake/alert Behavior During Therapy: Flat affect Overall Cognitive Status: Within Functional Limits for tasks assessed General Comments: For basic tasks; following commands well.   Blood pressure (!) 161/122, pulse 76, temperature 98 F (36.7 C), temperature source Oral, resp. rate (!) 23, height 5\' 10"  (1.778 m), weight 102.1 kg (225 lb), SpO2 98 %. Physical Exam  Vitals reviewed. Constitutional: He is oriented to person, place,  and time.  HENT:  Mild facial droop  Eyes: EOM are normal.  Neck: Normal range of motion. Neck supple. No thyromegaly present.  Cardiovascular: Normal rate, regular rhythm and normal heart sounds.   Respiratory: Effort normal and breath sounds normal. No respiratory distress.  GI: Soft. Bowel sounds are normal. He exhibits no distension.  Neurological: He is alert and oriented to person, place, and time.  Follows commands. Fair awareness of deficits  Skin: Skin is warm and dry.  Neuro:  Eyes without evidence of nystagmus  Tone is normal without evidence of spasticity Cerebellar exam shows no evidence of ataxia on finger nose finger or heel to shin testing No evidence of trunkal ataxia  Motor strength is 5/5 in R deltoid, biceps, triceps, finger flexors and extensors, wrist flexors and extensors, hip flexors, knee flexors and extensors, ankle dorsiflexors, plantar flexors, invertors and evertors, toe flexors and extensors 4/5 in L deltoid, biceps, triceps, finger flexors and extensors, wrist flexors and extensors, hip flexors, knee flexors and extensors, ankle dorsiflexors, plantar flexors, invertors and evertors, toe flexors and extensors Sensory exam redueced light touch in the LEFT upper and lower limbs   Cranial nerves II- Visual fields are intact to confrontation testing, no blurring of vision III- no evidence of ptosis, upward, downward and medial gaze intact IV- no vertical diplopia or head tilt V- no facial numbness or masseter weakness VI- no pupil abduction weakness VII- no facial droop, good lid closure VII- normal auditory acuity IX- no pharygeal weakness, X- no pharyngeal weakness, no hoarseness XI- no trap or SCM weakness XII- no glossal weakness    No results found for this or any previous visit (from the past 24 hour(s)). Mr Brain W Contrast  Result Date: 03/20/2017 CLINICAL DATA:  Stroke follow-up.  Increased weakness EXAM: MRI HEAD WITH CONTRAST TECHNIQUE:  Multiplanar, multiecho pulse sequences of the brain and surrounding structures were obtained with intravenous contrast. CONTRAST:  36mL MULTIHANCE GADOBENATE DIMEGLUMINE 529 MG/ML IV SOLN COMPARISON:  MRI head 03/19/2017 FINDINGS: Brain: Repeat diffusion is positive for acute infarct in the right paramedian pons. This is lateral  and inferior to a large area of hemorrhage in the central pons. Numerous areas of chronic microhemorrhage again noted. Postcontrast imaging reveals no enhancing lesion in the brain. Image quality degraded by motion. Vascular: Normal arterial and venous enhancement. Skull and upper cervical spine: No enhancing skull lesion identified. Sinuses/Orbits: Negative Other: None IMPRESSION: Interval development of acute infarct in the right paramedian inferior pons extending to the medulla. This is not identified yesterday. Numerous areas of chronic hemorrhage throughout the brain as noted on the prior MRI. Normal enhancement on today's study. Electronically Signed   By: Franchot Gallo M.D.   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 Does the need for close, 24 hr/day medical supervision in concert with the patient's rehab needs make it unreasonable for this patient to be served in a less intensive setting? Yes 2. Co-Morbidities requiring supervision/potential complications: HTN uncontrolled 3. Due to bladder management, bowel management, safety, skin/wound care, disease management, medication administration, pain management and patient education, does the patient require 24 hr/day rehab nursing? Yes 4. Does the patient require coordinated care of a physician, rehab nurse, PT (1-2 hrs/day, 5 days/week) and OT (1-2 hrs/day, 5 days/week) to address physical and functional deficits in the context of the above medical diagnosis(es)? Yes Addressing deficits in the following areas: balance, endurance, locomotion, strength, transferring,  bowel/bladder control, bathing, dressing, feeding, grooming, toileting and psychosocial support 5. Can the patient actively participate in an intensive therapy program of at least 3 hrs of therapy per day at least 5 days per week? Yes and need BP under better control 6. The potential for patient to make measurable gains while on inpatient rehab is excellent 7. Anticipated functional outcomes upon discharge from inpatient rehab are modified independent  with PT, modified independent with OT, n/a with SLP. 8. Estimated rehab length of stay to reach the above functional goals is: 10-14d 9. Anticipated D/C setting: Home 10. Anticipated post D/C treatments: [facility] therapy 11. Overall Rehab/Functional Prognosis: excellent  RECOMMENDATIONS: This patient's condition is appropriate for continued rehabilitative care in the following setting: CIR Patient has agreed to participate in recommended program. Yes Note that insurance prior authorization may be required for reimbursement for recommended care.  Comment: need po BP regimen and parameters for Sys and Diastolic BP   ANGIULLI,DANIEL J., PA-C 03/22/2017

## 2017-03-22 NOTE — Care Management Note (Signed)
Case Management Note  Patient Details  Name: BENJY KANA MRN: [id number] Date of Birth: 1971-06-30  Subjective/Objective:    Pt admitted on 03/18/17 with Rt pontine stroke.  PTA, pt independent, lives with wife, who is currently out of town.                  Action/Plan: Wife to return to town on Tuesday, per pt.  Will follow for discharge planning as pt progresses.  PT/OT consults pending.   Expected Discharge Date:  03/22/17               Expected Discharge Plan:  [place]  In-House Referral:     Discharge planning Services  CM Consult  Post Acute Care Choice:    Choice offered to:     DME Arranged:    DME Agency:     HH Arranged:    HH Agency:     Status of Service:  Completed, signed off  If discussed at [employer], dates discussed:    Additional Comments:  04/01/17 J. Nikiyah Fackler, RN, BSN Pt medically stable for dc to CIR today.     Reinaldo Raddle, RN, BSN  Trauma/Neuro ICU Case Manager [phone]

## 2017-03-23 ENCOUNTER — Encounter (HOSPITAL_COMMUNITY): Payer: Self-pay | Admitting: *Deleted

## 2017-03-23 ENCOUNTER — Inpatient Hospital Stay (HOSPITAL_COMMUNITY)
Admission: RE | Admit: 2017-03-23 | Discharge: 2017-04-07 | DRG: 057 | Disposition: A | Discharge status: Home Health | Payer: Medicaid Other | Source: Intra-hospital | Attending: Physical Medicine & Rehabilitation | Admitting: Physical Medicine & Rehabilitation

## 2017-03-23 DIAGNOSIS — E785 Hyperlipidemia, unspecified: Secondary | ICD-10-CM | POA: Diagnosis present

## 2017-03-23 DIAGNOSIS — R269 Unspecified abnormalities of gait and mobility: Secondary | ICD-10-CM

## 2017-03-23 DIAGNOSIS — E871 Hypo-osmolality and hyponatremia: Secondary | ICD-10-CM

## 2017-03-23 DIAGNOSIS — I69392 Facial weakness following cerebral infarction: Secondary | ICD-10-CM

## 2017-03-23 DIAGNOSIS — I69311 Memory deficit following cerebral infarction: Secondary | ICD-10-CM

## 2017-03-23 DIAGNOSIS — I6350 Cerebral infarction due to unspecified occlusion or stenosis of unspecified cerebral artery: Secondary | ICD-10-CM | POA: Diagnosis present

## 2017-03-23 DIAGNOSIS — I69354 Hemiplegia and hemiparesis following cerebral infarction affecting left non-dominant side: Secondary | ICD-10-CM

## 2017-03-23 DIAGNOSIS — E876 Hypokalemia: Secondary | ICD-10-CM

## 2017-03-23 DIAGNOSIS — I10 Essential (primary) hypertension: Secondary | ICD-10-CM | POA: Diagnosis present

## 2017-03-23 DIAGNOSIS — Z6832 Body mass index (BMI) 32.0-32.9, adult: Secondary | ICD-10-CM

## 2017-03-23 DIAGNOSIS — F4323 Adjustment disorder with mixed anxiety and depressed mood: Secondary | ICD-10-CM

## 2017-03-23 DIAGNOSIS — E669 Obesity, unspecified: Secondary | ICD-10-CM | POA: Diagnosis present

## 2017-03-23 DIAGNOSIS — N179 Acute kidney failure, unspecified: Secondary | ICD-10-CM | POA: Diagnosis present

## 2017-03-23 DIAGNOSIS — E119 Type 2 diabetes mellitus without complications: Secondary | ICD-10-CM

## 2017-03-23 DIAGNOSIS — I69398 Other sequelae of cerebral infarction: Secondary | ICD-10-CM

## 2017-03-23 DIAGNOSIS — G479 Sleep disorder, unspecified: Secondary | ICD-10-CM

## 2017-03-23 MED ORDER — ASPIRIN EC 81 MG PO TBEC
81.0000 mg | DELAYED_RELEASE_TABLET | Freq: Every day | ORAL | Status: DC
  Administered 2017-03-23 – 2017-04-06 (×15): 81 mg via ORAL
  Filled 2017-03-23 (×15): qty 1

## 2017-03-23 MED ORDER — TOPIRAMATE 25 MG PO TABS
50.0000 mg | ORAL_TABLET | Freq: Two times a day (BID) | ORAL | Status: DC
  Administered 2017-03-23 – 2017-04-07 (×30): 50 mg via ORAL
  Filled 2017-03-23 (×32): qty 2

## 2017-03-23 MED ORDER — ONDANSETRON HCL 4 MG/2ML IJ SOLN: 4.0000 mg | Freq: Four times a day (QID) | INTRAMUSCULAR | Status: DC | PRN

## 2017-03-23 MED ORDER — CLOPIDOGREL BISULFATE 75 MG PO TABS
75.0000 mg | ORAL_TABLET | Freq: Every day | ORAL | Status: DC
  Administered 2017-03-24 – 2017-04-07 (×15): 75 mg via ORAL
  Filled 2017-03-23 (×15): qty 1

## 2017-03-23 MED ORDER — ONDANSETRON HCL 4 MG PO TABS: 4.0000 mg | ORAL_TABLET | Freq: Four times a day (QID) | ORAL | Status: DC | PRN

## 2017-03-23 MED ORDER — ACETAMINOPHEN 325 MG PO TABS
650.0000 mg | ORAL_TABLET | ORAL | Status: DC | PRN
  Administered 2017-03-23 – 2017-04-06 (×6): 650 mg via ORAL
  Filled 2017-03-23 (×6): qty 2

## 2017-03-23 MED ORDER — HYDROCHLOROTHIAZIDE 25 MG PO TABS
25.0000 mg | ORAL_TABLET | Freq: Every day | ORAL | Status: DC
  Administered 2017-03-24 – 2017-04-07 (×15): 25 mg via ORAL
  Filled 2017-03-23 (×15): qty 1

## 2017-03-23 MED ORDER — SORBITOL 70 % SOLN
30.0000 mL | Freq: Every day | Status: DC | PRN
  Administered 2017-03-27 – 2017-04-03 (×2): 30 mL via ORAL
  Filled 2017-03-23 (×4): qty 30

## 2017-03-23 MED ORDER — AMLODIPINE BESYLATE 10 MG PO TABS
10.0000 mg | ORAL_TABLET | Freq: Every day | ORAL | Status: DC
  Administered 2017-03-24 – 2017-04-07 (×15): 10 mg via ORAL
  Filled 2017-03-23 (×15): qty 1

## 2017-03-23 MED ORDER — ACETAMINOPHEN 160 MG/5ML PO SOLN: 650.0000 mg | ORAL | Status: DC | PRN

## 2017-03-23 MED ORDER — METFORMIN HCL 500 MG PO TABS
500.0000 mg | ORAL_TABLET | Freq: Two times a day (BID) | ORAL | Status: DC
  Administered 2017-03-24 – 2017-04-07 (×29): 500 mg via ORAL
  Filled 2017-03-23 (×29): qty 1

## 2017-03-23 MED ORDER — ACETAMINOPHEN 650 MG RE SUPP: 650.0000 mg | RECTAL | Status: DC | PRN

## 2017-03-23 MED ORDER — ATORVASTATIN CALCIUM 20 MG PO TABS
20.0000 mg | ORAL_TABLET | Freq: Every day | ORAL | Status: DC
  Administered 2017-03-23 – 2017-04-06 (×15): 20 mg via ORAL
  Filled 2017-03-23 (×15): qty 1

## 2017-03-23 NOTE — Progress Notes (Signed)
03/23/17 1658 nursing To rehab per wheelchair accompanied by RN alert and oriented patient with noted left sided weakness. Fall bundle explained and signed by patient. Oriented to unit set up.

## 2017-03-23 NOTE — Progress Notes (Signed)
STROKE TEAM PROGRESS NOTE   SUBJECTIVE (INTERVAL HISTORY) No acute events overnight.  The patient is awake, alert, and oriented.  On exam, pt was on clevedipine gtt for DBP > 132mmHg.  Continuing oral HCTZ and amlodipine.  Goal liberalized to SBP < 138mmHg.  Clevidipine remains off.  Pain improved with topiramate.  Orders in to transfer out of the ICU.  Has been seen in ICU by CIR coordinator and Physical Medicine.  OBJECTIVE Temp:  [97.6 F (36.4 C)-98.4 F (36.9 C)] 97.6 F (36.4 C) (08/09 0800) Pulse Rate:  [72-89] 85 (08/09 0800) Cardiac Rhythm: Normal sinus rhythm (08/09 0800) Resp:  [16-26] 24 (08/09 0800) BP: (149-177)/(102-127) 170/125 (08/09 0800) SpO2:  [95 %-99 %] 98 % (08/09 0800)  CBC:   Recent Labs Lab 03/18/17 1537 03/18/17 1647 03/20/17 0237  WBC 10.6*  --  10.7*  NEUTROABS 4.0  --   --   HGB 13.8 14.3 15.5  HCT 39.7 42.0 45.6  MCV 80.0  --  81.0  PLT 378  --  782    Basic Metabolic Panel:   Recent Labs Lab 03/18/17 1537 03/18/17 1647 03/20/17 0237  NA 136 139 133*  K 3.4* 3.4* 3.6  CL 101 99* 100*  CO2 26  --  25  GLUCOSE 148* 153* 180*  BUN 13 17 10   CREATININE 1.24 1.10 1.08  CALCIUM 9.2  --  9.3    Lipid Panel:     Component Value Date/Time   CHOL 133 03/19/2017 0211   TRIG 67 03/19/2017 0211   HDL 40 (L) 03/19/2017 0211   CHOLHDL 3.3 03/19/2017 0211   VLDL 13 03/19/2017 0211   LDLCALC 80 03/19/2017 0211   HgbA1c:  Lab Results  Component Value Date   HGBA1C 8.1 (H) 03/19/2017   Urine Drug Screen:     Component Value Date/Time   LABOPIA NONE DETECTED 03/18/2017 1802   COCAINSCRNUR NONE DETECTED 03/18/2017 1802   LABBENZ NONE DETECTED 03/18/2017 1802   AMPHETMU NONE DETECTED 03/18/2017 1802   THCU NONE DETECTED 03/18/2017 1802   LABBARB NONE DETECTED 03/18/2017 1802    Alcohol Level     Component Value Date/Time   ETH <5 03/18/2017 1537    IMAGING   Ct Angio Neck W Or Wo Contrast Ct Angio Head W Or Wo  Contrast 03/18/2017 1. Negative for emergent large vessel occlusion.  2. Positive for moderate to severe stenosis of the distal Right vertebral artery, and Left PCA origin. Basilar artery irregularity also suggesting atherosclerosis, without significant stenosis.  3. Comparatively minimal atherosclerosis of the carotid arteries and anterior circulation.   Ct Head Code Stroke Wo Contrast 03/18/2017 1. Age advanced intracranial atherosclerosis and white matter disease, plus evidence of lacunar infarcts in the pons.  No acute cortically based infarct or acute intracranial hemorrhage identified.  2. ASPECTS is 10.   MRI Brain Wo Contrast 03/19/2017 IMPRESSION: 1. Severe for age signal changes in the brain mostly consisting of numerous chronic micro-hemorrhages and patchy T2 and FLAIR hyperintensity suggesting advanced chronic small vessel disease. 2. More macroscopic hemorrhages in the central pons (see #4), and the mesial right temporal lobe, but both favored to be nonacute.  Still, in the setting of recent tPA recommend a Repeat Noncontrast Head CT to compare to that on 03/18/2017. 3. Questionable recent ischemia in the posterior right hippocampal formation. Otherwise no convincing evidence of acute infarct. 4. Widespread abnormal signal in the pons, in part related to chronic hemorrhages and chronic lacunar infarcts, but  difficult to exclude acute pontine edema - such as might be seen with osmotic demyelination.  Repeat CT Head Wo Contrast 03/19/2017 IMPRESSION: 1. No acute intracranial hemorrhage or other process identified.  Previously seen macroscopic and microscopic hemorrhages on prior MRI appear to be chronic in nature. 2. No other acute intracranial process identified. No definite evolving large vessel territory infarct. 3. Stable atrophy with chronic microvascular ischemic disease.  MRI Brain with Contrast 03/20/2017 IMPRESSION: Interval development of acute infarct in the right paramedian  inferior pons extending to the medulla. This is not identified yesterday.  Numerous areas of chronic hemorrhage throughout the brain as noted on the prior MRI.  Normal enhancement on today's study.  TTE 03/19/2017 Study Conclusions - Left ventricle: The cavity size was normal. Posterior wall thickness was increased in a pattern of mild LVH. Systolic function was normal. The estimated ejection fraction was in the range of 50% to 55%. Wall motion was normal; there were no regional wall motion abnormalities. The study is not technically sufficient to allow evaluation of LV diastolic function. Doppler parameters are consistent with indeterminate ventricular filling pressure. - Aortic valve: Transvalvular velocity was within the normal range.  There was no stenosis. There was no regurgitation. - Mitral valve: Transvalvular velocity was within the normal range.  There was no evidence for stenosis. There was no regurgitation. - Right ventricle: The cavity size was normal. Wall thickness was normal. Systolic function was normal. - Atrial septum: No defect or patent foramen ovale was identified by color flow Doppler. - Tricuspid valve: There was trivial regurgitation. - Pulmonary arteries: Systolic pressure was within the normal range. PA peak pressure: 18 mm Hg (S).  PHYSICAL EXAM Middle-age male currently not in distress. . Afebrile. Head is nontraumatic. Neck is supple without bruit.    Cardiac exam no murmur or gallop. Lungs are clear to auscultation. Distal pulses are well felt. Neurological Exam :  Awake, alert, fully oriented. EOMI, PERL. Face symmetric. Tongue midline. Strength 5/5 BUE and BLE. But effort is poor and variable with mild left shoulder weakness Coordination - FTN, HTS intact bilateral. Sensory- intact bilateral. Except for mild left lower facial decreased sensation. Gait - deferred.     ASSESSMENT/PLAN Mr. DANNELL GORTNEY is a 46 y.o. male with history of hypertension and  obesity presenting with elevated blood pressure, severe dizziness, difficulty walking, blurred vision, and left-sided numbness. tPA Given:Saturday 03/18/17 at 1730.   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    Resultant  - left facial numbness and mild left upper extremity weakness  CT head -  Evidence of lacunar infarcts in the pons. No acute cortically based infarct identified.  MRI head - Interval development of acute infarct in the right paramedian inferior pons extending to the medulla  MRA head - not ordered   CTA H&N - Moderate to severe stenosis of the distal Right vertebral artery, and Left PCA origin.  Carotid Doppler - CTA neck  2D Echo - EF 50-55%. No source of embolus  LDL - 80  HgbA1c - 8.1; diabetes coordinator following; started on metformin 500mg  BID.  Continue metformin at discharge.  VTE prophylaxis - SCDs Diet heart healthy/carb modified Room service appropriate? Yes; Fluid consistency: Thin  No antithrombotic prior to admission, now on No antithrombotic - recent TPA therapy. Plan aspirin and Plavix for 3 months followed by Plavix alone  Patient counseled to be compliant with his antithrombotic medications  Ongoing aggressive stroke risk factor management  Therapy recommendations: pending    Disposition: Discharge and readmit to CIR.  Hypertension  Stable - blood pressure tends to run high Permissive hypertension (OK if < 220/120) but gradually normalize in 5-7 days Long-term BP goal normotensive  Hyperlipidemia  Home meds: No lipid-lowering medications prior to admission  LDL 80, goal < 70  Add Lipitor 20 mg daily  Continue statin at discharge   Other Stroke Risk Factors  Obesity, Body mass index is 32.28 kg/m., recommend weight loss, diet and exercise as appropriate   Hx stroke/TIA - by imaging  Other Active Problems  None  Hospital day #  5  I have personally examined this patient, reviewed notes, independently viewed imaging studies, participated in medical decision making and plan of care.ROS completed by me personally and pertinent positives fully documented  I have made any additions or clarifications directly to the above note. He has presented with right paramedian pontine infarct likely from symptomatic terminal right vertebral artery stenosis though he does have evidence of significant small vessel disease with microhemorrhages and old lacunar infarcts. Recommend dual antiplatelet therapy and aggressive risk factor modification.  continue Topamax for neuropathic pain as it seems to be helping.   Transfer to rehab  today as bed is available    Antony Contras, MD Medical Director [place] Pager: [phone] 03/23/2017 12:06 PM   To contact Stroke Continuity provider, please refer to [email or web address] After hours, contact General Neurology

## 2017-03-23 NOTE — PMR Pre-admission (Signed)
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 right handed male with reported history of obesity, hypertension. Patient has not taken his blood pressure medication in approximately 9 months because of financial reasons. Blood pressure 238/136 and placed on cleviprex and labetalol. Per chart review patient lives with spouse and 2 children ages 33 and 8. Independent prior to admission and works at [employer] in addition to a second job. 2 level home with bedroom upstairs. Wife works during the day. He presented 03/18/2017 with severe dizziness, left-sided weakness and numbness. Cranial CT scan negative. Patient did receive TPA. MRI showed acute infarction right paramedian inferior pons extending into the medulla. 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 UDS negative. CT angio head and neck negative for emergent large vessel occlusion. 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 Echocardiogram with ejection fraction of 55% no wall motion abnormalities. EEG negative for seizure. Neurology consulted currently on aspirin and Plavix for CVA prophylaxis. Findings of elevated hemoglobin A1c 8.1 with diabetic coordinator consulted. Tolerating a regular diet. Physical and occupational therapy evaluations completed with recommendations of physical medicine rehabilitation  consult.    Total: 4=NIH  Past Medical History  Past Medical History:  Diagnosis Date  . Hypertension     Family History  family history is not on file.  Prior Rehab/Hospitalizations: No previous rehab  Has the patient had major surgery during 100 days prior to admission? No  Current Medications   Current Facility-Administered Medications:  .  0.9 %  sodium chloride infusion, , Intravenous, Continuous, Eshraghi, Shervin, MD, Last Rate: 10 mL/hr at 03/23/17 0700 .  acetaminophen (TYLENOL) tablet 650 mg, 650 mg, Oral, Q4H PRN, 650 mg at 03/20/17 1212 **OR** acetaminophen (TYLENOL) solution 650 mg, 650 mg, Per Tube, Q4H PRN **OR** acetaminophen (TYLENOL) suppository 650 mg, 650 mg, Rectal, Q4H PRN, Aroor, Karena Addison R, MD .  amLODipine (NORVASC) tablet 10 mg, 10 mg, Oral, Daily, Patteson, Samuel A, NP, 10 mg at 03/23/17 1006 .  aspirin EC tablet 81 mg, 81 mg, Oral, QHS, Wallie Char, 81 mg at 03/22/17 2146 .  atorvastatin (LIPITOR) tablet 20 mg, 20 mg, Oral, q1800, Rinehuls, David L, PA-C, 20 mg at 03/22/17 1753 .  clevidipine (CLEVIPREX) infusion 0.5 mg/mL, 0-21 mg/hr, Intravenous, Continuous, Patteson, Arlan Organ, NP, Stopped at 03/21/17 1624 .  clopidogrel (PLAVIX) tablet 75 mg, 75 mg, Oral, Daily, Garvin Fila, MD, 75 mg at 03/23/17 1006 .  hydrochlorothiazide (HYDRODIURIL) tablet 25 mg, 25 mg, Oral, Daily, Patteson, Samuel A, NP, 25 mg at 03/23/17 1006 .  ketorolac (TORADOL) 15 MG/ML injection 15 mg, 15 mg, Intravenous, Q6H PRN, Wallie Char, 15 mg at 03/21/17 2054 .  labetalol (NORMODYNE,TRANDATE) injection 10 mg, 10 mg, Intravenous, Q2H PRN, Aroor,  Lanice Schwab, MD, 10 mg at 03/22/17 0817 .  living well with diabetes book MISC, , Does not apply, Once, Garvin Fila, MD .  metFORMIN (GLUCOPHAGE) tablet 500 mg, 500 mg, Oral, BID WC, Patteson, Samuel A, NP, 500 mg at 03/23/17 0852 .  pantoprazole (PROTONIX) EC tablet 40 mg, 40 mg, Oral, QHS, Rumbarger, Valeda Malm, RPH, 40 mg at  03/22/17 2146 .  topiramate (TOPAMAX) tablet 50 mg, 50 mg, Oral, BID, Patteson, Samuel A, NP, 50 mg at 03/23/17 1006  Patients Current Diet: Diet heart healthy/carb modified Room service appropriate? Yes; Fluid consistency: Thin  Precautions / Restrictions Precautions Precautions: Fall Precaution Comments: SBP <180 Restrictions Weight Bearing Restrictions: No   Has the patient had 2 or more falls or a fall with injury in the past year?No  Prior Activity Level Community (5-7x/wk): Went out daily.  Was working FT days at a call center.  Home Assistive Devices / Equipment Home Assistive Devices/Equipment: None Home Equipment: None  Prior Device Use: Indicate devices/aids used by the patient prior to current illness, exacerbation or injury? None  Prior Functional Level Prior Function Level of Independence: Independent Comments: worked at [employer] in addition to second job  Self Care: Did the patient need help bathing, dressing, using the toilet or eating?  Independent  Indoor Mobility: Did the patient need assistance with walking from room to room (with or without device)? Independent  Stairs: Did the patient need assistance with internal or external stairs (with or without device)? Independent  Functional Cognition: Did the patient need help planning regular tasks such as shopping or remembering to take medications? Independent  Current Functional Level Cognition  Arousal/Alertness: Awake/alert Overall Cognitive Status: Within Functional Limits for tasks assessed Orientation Level: Oriented X4 General Comments: For basic tasks; following commands well.  Attention: Sustained Sustained Attention: Appears intact Memory: Appears intact Awareness: Appears intact Problem Solving: Appears intact Safety/Judgment: Appears intact    Extremity Assessment (includes Sensation/Coordination)  Upper Extremity Assessment: Defer to OT evaluation LUE Deficits / Details: AROM WFL;  incoordination. Pt requires cues to use LUE at times. Able to complete functional movement patterns. Greater difficulty with fine motor skills. "clumsy hand" LUE Sensation: decreased light touch, decreased proprioception LUE Coordination: decreased fine motor, decreased gross motor  Lower Extremity Assessment: LLE deficits/detail LLE Deficits / Details: Grossly ~3/5 throughout knee extensors, knee flexors, 2+/5 hip flexion, ankle DF. LLE Sensation: decreased light touch, decreased proprioception LLE Coordination: decreased fine motor, decreased gross motor    ADLs  Overall ADL's : Needs assistance/impaired Eating/Feeding: Set up Grooming: Set up, Sitting Upper Body Bathing: Set up, Supervision/ safety, Sitting Lower Body Bathing: Moderate assistance, Sit to/from stand Upper Body Dressing : Moderate assistance, Sitting Lower Body Dressing: Moderate assistance, Sit to/from stand Toilet Transfer: +2 for physical assistance, Moderate assistance, Stand-pivot Toileting- Clothing Manipulation and Hygiene: Moderate assistance Functional mobility during ADLs: Maximal assistance, +2 for physical assistance (ambulating 5 feet)    Mobility  Overal bed mobility: Needs Assistance Bed Mobility: Sidelying to Sit, Rolling Rolling: Min guard Sidelying to sit: Min guard, HOB elevated General bed mobility comments: Able to get to EOB without assist, increased time. Able to push through LUE with cues.     Transfers  Overall transfer level: Needs assistance Equipment used: 2 person hand held assist Transfers: Sit to/from Stand Sit to Stand: Min assist, +2 physical assistance Stand pivot transfers: Max assist, +2 physical assistance General transfer comment: Assist to power to standing with cues for hand placement  for WB through [facility]. Stood from [employer], from chair x1. No dizziness.     Ambulation / Gait / Stairs / Wheelchair Mobility  Ambulation/Gait Ambulation/Gait assistance: Max assist, +2  physical assistance Ambulation Distance (Feet): 4 Feet Assistive device: 2 person hand held assist Gait Pattern/deviations: Step-through pattern, Narrow base of support, Ataxic General Gait Details: Pt with heavy left lateral lean, difficulty sequencing movements for LE advancement. Left knee buckling during stance requiring support to prevent giving out. Ataxic like movement.  Gait velocity: decreased Gait velocity interpretation: Below normal speed for age/gender    Posture / Balance Dynamic Sitting Balance Sitting balance - Comments: Able to sit EOB without support; right lateral lean bias. Attempts to self correct to midline but needs visual cues.  Balance Overall balance assessment: Needs assistance Sitting-balance support: Feet supported Sitting balance-Leahy Scale: Fair Sitting balance - Comments: Able to sit EOB without support; right lateral lean bias. Attempts to self correct to midline but needs visual cues.  Postural control: Right lateral lean Standing balance support: During functional activity Standing balance-Leahy Scale: Poor Standing balance comment: Requires MAx A of 2 for dynamic standing balance; able to stand statically with Min guard assist.     Special needs/care consideration BiPAP/CPAP No CPM No Continuous Drip IV KVO Dialysis No       Life Vest No Oxygen No Special Bed No Trach Size No Wound Vac (area) No     Skin No                          Bowel mgmt: Last BM 03/23/17 Bladder mgmt: Voiding in urinal Diabetic mgmt Yes, new diabetic with Hgb A1C 8.1    Previous Home Environment Living Arrangements: Spouse/significant other Available Help at Discharge: Family, Available 24 hours/day Type of Home: House Home Layout: Two level, Bed/bath upstairs Alternate Level Stairs-Number of Steps: 1 flight Home Access: Level entry Bathroom Shower/Tub: Chiropodist: Standard Home Care Services: No  Discharge Living Setting Plans for Discharge  Living Setting: Patient's home, House, Lives with (comment) (Lives with wife, 75 yo and 98 yo children.) Type of Home at Discharge: House Discharge Home Layout: Two level, 1/2 bath on main level, Bed/bath upstairs Alternate Level Stairs-Number of Steps: about 15 steps Discharge Home Access: Stairs to enter Entrance Stairs-Number of Steps: 2 steps at front entry. Does the patient have any problems obtaining your medications?: No  Social/Family/Support Systems Patient Roles: Spouse, Parent (Has a wife, children ages 48, 52, 46 yo.) Contact Information: Kip Cropp - wife - [phone] Anticipated Caregiver: Wife and wife's cousin, Juliann Pulse Ability/Limitations of Caregiver: Wife works 8 am to 4 pm daily.  Wife's cousin, Juliann Pulse, works nights but can check on patient and assist prn. Caregiver Availability: Intermittent Discharge Plan Discussed with Primary Caregiver: Yes Is Caregiver In Agreement with Plan?: Yes Does Caregiver/Family have Issues with Lodging/Transportation while Pt is in Rehab?: No  Goals/Additional Needs Patient/Family Goal for Rehab: PT/OT mod I goals Expected length of stay: 10-14 days Cultural Considerations: None Dietary Needs: Heart healthy, carb modified, thin liquids Equipment Needs: TBD Pt/Family Agrees to Admission and willing to participate: Yes Program Orientation Provided & Reviewed with Pt/Caregiver Including Roles  & Responsibilities: Yes  Decrease burden of Care through IP rehab admission: N/A  Possible need for SNF placement upon discharge: Not planned  Patient Condition: This patient's condition remains as documented in the consult dated 03/22/17, in which the Rehabilitation Physician determined and documented that  the patient's condition is appropriate for intensive rehabilitative care in an inpatient rehabilitation facility. Will admit to inpatient rehab today.  Preadmission Screen Completed By:  Retta Diones, 03/23/2017 1:25  PM ______________________________________________________________________   Discussed status with Dr. Letta Pate on 03/23/17 at 1324 and received telephone approval for admission today.  Admission Coordinator:  Retta Diones, time 1324/Date 03/23/17

## 2017-03-23 NOTE — H&P (Signed)
Physical Medicine and Rehabilitation Admission H&P        Chief Complaint  Patient presents with  . Hypertension  . Numbness  : HPI: Zachary [name] is a 46 y.o. right handed male with reported history of obesity, hypertension. Patient has not taken his blood pressure medication in approximately 9 months because of financial reasons. Blood pressure 238/136 and placed on cleviprex and labetalol. Per chart review patient lives with spouse and 2 children ages 34 and 75. Independent prior to admission and works at [employer] in addition to a second job. 2 level home with bedroom upstairs. Wife works during the day. He presented 03/18/2017 with severe dizziness, left-sided weakness and numbness. Cranial CT scan negative. Patient did receive TPA. MRI showed acute infarction right paramedian inferior pons extending into the medulla. 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 UDS negative. CT angio head and neck negative for emergent large vessel occlusion. 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 Echocardiogram with ejection fraction of 55% no wall motion abnormalities. EEG negative for seizure. Neurology consulted currently on aspirin and Plavix for CVA prophylaxis. Findings of elevated hemoglobin A1c 8.1 with diabetic coordinator consulted. Intermittent bouts of headaches with Topamax initiated. Tolerating a regular diet. Physical and occupational therapy evaluations completed with recommendations of physical medicine rehabilitation consult.Patient was admitted for a comprehensive rehabilitation program   Review of Systems  Constitutional: Negative for chills, fever and weight loss.  HENT: Negative for hearing loss.   Eyes: Negative for blurred vision and double vision.  Respiratory: Negative for cough and shortness of breath.   Cardiovascular: Negative for chest pain, palpitations and leg swelling.  Gastrointestinal: Positive  for constipation. Negative for vomiting.  Genitourinary: Negative for dysuria, flank pain and hematuria.  Musculoskeletal: Positive for myalgias.  Skin: Negative for rash.  Neurological: Positive for dizziness, sensory change, weakness and headaches. Negative for seizures.  All other systems reviewed and are negative.       Past Medical History:  Diagnosis Date  . Hypertension      History reviewed. No pertinent surgical history. History reviewed. No pertinent family history. Social History:  reports that he has never smoked. He has never used smokeless tobacco. He reports that he does not drink alcohol or use drugs. Allergies: No Known Allergies       Medications Prior to Admission  Medication Sig Dispense Refill  . Aspirin-Acetaminophen-Caffeine (EXCEDRIN PO) Take 2 tablets by mouth as needed (Headache/Migraine).          Home: Home Living Family/patient expects to be discharged to:: Private residence Living Arrangements: Spouse/significant other Available Help at Discharge: Family, Available 24 hours/day Type of Home: House Home Access: Level entry Home Layout: Two level, Bed/bath upstairs Alternate Level Stairs-Number of Steps: 1 flight Bathroom Shower/Tub: Chiropodist: Standard Home Equipment: None   Functional History: Prior Function Level of Independence: Independent Comments: worked at [employer] in addition to second job   Functional Status:  Mobility: Bed Mobility Overal bed mobility: Needs Assistance Bed Mobility: Sidelying to Sit, Rolling Rolling: Min guard Sidelying to sit: Min guard, HOB elevated General bed mobility comments: Able to get to EOB without assist, increased time. Able to push through LUE with cues.  Transfers Overall transfer level: Needs assistance Equipment used: 2 person hand held assist Transfers: Sit to/from Stand Sit to Stand: Min assist, +2 physical assistance Stand pivot transfers: Max assist, +2 physical  assistance General transfer comment:  Assist to power to standing with cues for hand placement for WB through LUE/LE. Stood from [employer], from chair x1. No dizziness.  Ambulation/Gait Ambulation/Gait assistance: Max assist, +2 physical assistance Ambulation Distance (Feet): 4 Feet Assistive device: 2 person hand held assist Gait Pattern/deviations: Step-through pattern, Narrow base of support, Ataxic General Gait Details: Pt with heavy left lateral lean, difficulty sequencing movements for LE advancement. Left knee buckling during stance requiring support to prevent giving out. Ataxic like movement.  Gait velocity: decreased Gait velocity interpretation: Below normal speed for age/gender   ADL: ADL Overall ADL's : Needs assistance/impaired Eating/Feeding: Set up Grooming: Set up, Sitting Upper Body Bathing: Set up, Supervision/ safety, Sitting Lower Body Bathing: Moderate assistance, Sit to/from stand Upper Body Dressing : Moderate assistance, Sitting Lower Body Dressing: Moderate assistance, Sit to/from stand Toilet Transfer: +2 for physical assistance, Moderate assistance, Stand-pivot Toileting- Clothing Manipulation and Hygiene: Moderate assistance Functional mobility during ADLs: Maximal assistance, +2 for physical assistance (ambulating 5 feet)   Cognition: Cognition Overall Cognitive Status: Within Functional Limits for tasks assessed Arousal/Alertness: Awake/alert Orientation Level: Oriented X4 Attention: Sustained Sustained Attention: Appears intact Memory: Appears intact Awareness: Appears intact Problem Solving: Appears intact Safety/Judgment: Appears intact Cognition Arousal/Alertness: Awake/alert Behavior During Therapy: Flat affect Overall Cognitive Status: Within Functional Limits for tasks assessed General Comments: For basic tasks; following commands well.    Physical Exam: Blood pressure (!) 162/105, pulse 81, temperature 98.4 F (36.9 C), temperature source  Oral, resp. rate (!) 24, height 5\' 10"  (1.778 m), weight 102.1 kg (225 lb), SpO2 94 %. Physical Exam  Constitutional: He appears well-developed.  HENT:  Head: Normocephalic.  Eyes: EOM are normal.  Neck: Normal range of motion. Neck supple. No thyromegaly present.  Cardiovascular: Normal rate, regular rhythm and normal heart sounds.   Respiratory: Effort normal and breath sounds normal. No respiratory distress.  GI: Soft. Bowel sounds are normal. He exhibits no distension.  Skin. Warm and dry Neurological. Alert and oriented person place and time. Mild facial droop Eyes without evidence of nystagmus  Tone is normal without evidence of spasticity Cerebellar exam shows no evidence of ataxia on finger nose finger or heel to shin testing No evidence of trunkal ataxia   Motor strength is 5/5 in R deltoid, biceps, triceps, finger flexors and extensors, wrist flexors and extensors, hip flexors, knee flexors and extensors, ankle dorsiflexors, plantar flexors, invertors and evertors, toe flexors and extensors 4/5 in L deltoid, biceps, triceps, finger flexors and extensors, wrist flexors and extensors, hip flexors, knee flexors and extensors, ankle dorsiflexors, plantar flexors, invertors and evertors, toe flexors and extensors Sensory exam redueced light touch in the LEFT lower limb ankle and foot only  Cranial nerves II- Visual fields are intact to confrontation testing, no blurring of vision III- no evidence of ptosis, upward, downward and medial gaze intact IV- no vertical diplopia or head tilt V- no facial numbness or masseter weakness VI- no pupil abduction weakness VII- no facial droop, good lid closure VII- normal auditory acuity IX- no pharygeal weakness, X- no pharyngeal weakness, no hoarseness XI- no trap or SCM weakness XII- no glossal weakness               Medical Problem List and Plan: 1.  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  DVT Prophylaxis/Anticoagulation: SCDs. Monitor for any signs of DVT 3. Pain Management/headaches: Topamax 50 mg twice a day 4. Mood: Provide emotional support 5. Neuropsych:  This patient is capable of making decisions on his own behalf. 6. Skin/Wound Care: Routine skin checks 7. Fluids/Electrolytes/Nutrition: Routine I&O with follow-up chemistries 8. Hypertension. Permissive hypertension. Currently maintained on HCTZ 25 mg daily, Norvasc 10 mg daily. Monitor with increased mobility 9.Findings diabetes mellitus. Hemoglobin A1c 8.1. Glucophage 500 mg twice a day. Check blood sugars before meals and at bedtime. Diabetic teaching 10. Hyperlipidemia. Lipitor 11. Obesity. BMI 32.28 kg/m2     Post Admission Physician Evaluation: 1. Functional deficits secondary  to Right pontine infarct. 2. Patient is admitted to receive collaborative, interdisciplinary care between the physiatrist, rehab nursing staff, and therapy team. 3. Patient's level of medical complexity and substantial therapy needs in context of that medical necessity cannot be provided at a lesser intensity of care such as a SNF. 4. Patient has experienced substantial functional loss from his/her baseline which was documented above under the "Functional History" and "Functional Status" headings.  Judging by the patient's diagnosis, physical exam, and functional history, the patient has potential for functional progress which will result in measurable gains while on inpatient rehab.  These gains will be of substantial and practical use upon discharge  in facilitating mobility and self-care at the household level. 5. Physiatrist will provide 24 hour management of medical needs as well as oversight of the therapy plan/treatment and provide guidance as appropriate regarding the interaction of the two. 6. The Preadmission Screening has been reviewed and patient status is unchanged unless otherwise stated above. 7. 24 hour rehab nursing will  assist with bladder management, bowel management, safety, skin/wound care, disease management, medication administration, pain management and patient education  and help integrate therapy concepts, techniques,education, etc. 8. PT will assess and treat for/with: pre gait, gait training, endurance , safety, equipment, neuromuscular re education.   Goals are: ModI/Sup. 9. OT will assess and treat for/with: ADLs, Cognitive perceptual skills, Neuromuscular re education, safety, endurance, equipment.   Goals are: Mod I/Sup. Therapy may proceed with showering this patient. 10. SLP will assess and treat for/with: NA.  Goals are: NA. 11. Case Management and Social Worker will assess and treat for psychological issues and discharge planning. 12. Team conference will be held weekly to assess progress toward goals and to determine barriers to discharge. 13. Patient will receive at least 3 hours of therapy per day at least 5 days per week. 14. ELOS: 10-14d       15. Prognosis:  excellent         Charlett Blake M.D. [facility] FAAPM&R (Sports Med, Neuromuscular Med) Diplomate Am Board of Electrodiagnostic Med  Cathlyn Parsons., PA-C 03/22/2017

## 2017-03-23 NOTE — Progress Notes (Signed)
[name], [name], MD Physician Signed Physical Medicine and Rehabilitation  Consult Note Date of Service: 03/22/2017 6:34 AM  Related encounter: ED to Hosp-Admission (Current) from 03/18/2017 in [facility] NEURO/TRAUMA/SURGICAL ICU     Expand All Collapse All   [] Hide copied text [] Hover for attribution information      Physical Medicine and Rehabilitation Consult Reason for Consult: Left side weakness Referring Physician: Dr. Leonie Man   HPI: Zachary [name] is a 46 y.o. right handed male with reported history of obesity, hypertension. Patient has not taken his blood pressure medication in approximately 9 months because of financial reasons. Blood pressure 238/136 and placed on cleviprex and labetalol. Per chart review patient lives with spouse and 2 children ages 14 and 51. Independent prior to admission and works at [employer] in addition to a second job. 2 level home with bedroom upstairs. Wife works during the day. He presented 03/18/2017 with severe dizziness, left-sided weakness and numbness. Cranial CT scan negative. Patient did receive TPA. MRI showed acute infarction right paramedian inferior pons extending into the medulla. 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 UDS negative. CT angio head and neck negative for emergent large vessel occlusion. 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 Echocardiogram with ejection fraction of 55% no wall motion abnormalities. EEG negative for seizure. Neurology consulted currently on aspirin and Plavix for CVA prophylaxis. Findings of elevated hemoglobin A1c 8.1 with diabetic coordinator consulted. Tolerating a regular diet. Physical and occupational therapy evaluations completed with recommendations of physical medicine rehabilitation consult.   Review of Systems  Constitutional: Negative for chills and fever.  HENT: Negative for hearing loss.   Eyes: Negative for  blurred vision and double vision.  Respiratory: Negative for cough and shortness of breath.   Cardiovascular: Negative for chest pain, palpitations and leg swelling.  Gastrointestinal: Positive for constipation. Negative for nausea and vomiting.  Genitourinary: Negative for dysuria, flank pain and hematuria.  Musculoskeletal: Positive for myalgias.  Skin: Negative for rash.  Neurological: Positive for dizziness, sensory change and weakness. Negative for seizures.  All other systems reviewed and are negative.      Past Medical History:  Diagnosis Date  . Hypertension    History reviewed. No pertinent surgical history. History reviewed. No pertinent family history. Social History:  reports that he has never smoked. He has never used smokeless tobacco. He reports that he does not drink alcohol or use drugs. Allergies: No Known Allergies       Medications Prior to Admission  Medication Sig Dispense Refill  . Aspirin-Acetaminophen-Caffeine (EXCEDRIN PO) Take 2 tablets by mouth as needed (Headache/Migraine).      Home: Home Living Family/patient expects to be discharged to:: Private residence Living Arrangements: Spouse/significant other Available Help at Discharge: Family, Available 24 hours/day Type of Home: House Home Access: Level entry Home Layout: Two level, Bed/bath upstairs Alternate Level Stairs-Number of Steps: 1 flight Bathroom Shower/Tub: Chiropodist: Standard Home Equipment: None  Functional History: Prior Function Level of Independence: Independent Comments: worked at [employer] in addition to second job Functional Status:  Mobility: Bed Mobility Overal bed mobility: Needs Assistance Bed Mobility: Sidelying to Sit, Rolling Rolling: Min guard Sidelying to sit: Min guard, HOB elevated General bed mobility comments: Able to get to EOB without assist, increased time. Able to push through LUE with cues.  Transfers Overall transfer  level: Needs assistance Equipment used: 2 person hand held assist Transfers: Sit to/from  Stand Sit to Stand: Min assist, +2 physical assistance Stand pivot transfers: Max assist, +2 physical assistance General transfer comment: Assist to power to standing with cues for hand placement for WB through LUE/LE. Stood from [employer], from chair x1. No dizziness.  Ambulation/Gait Ambulation/Gait assistance: Max assist, +2 physical assistance Ambulation Distance (Feet): 4 Feet Assistive device: 2 person hand held assist Gait Pattern/deviations: Step-through pattern, Narrow base of support, Ataxic General Gait Details: Pt with heavy left lateral lean, difficulty sequencing movements for LE advancement. Left knee buckling during stance requiring support to prevent giving out. Ataxic like movement.  Gait velocity: decreased Gait velocity interpretation: Below normal speed for age/gender  ADL: ADL Overall ADL's : Needs assistance/impaired Eating/Feeding: Set up Grooming: Set up, Sitting Upper Body Bathing: Set up, Supervision/ safety, Sitting Lower Body Bathing: Moderate assistance, Sit to/from stand Upper Body Dressing : Moderate assistance, Sitting Lower Body Dressing: Moderate assistance, Sit to/from stand Toilet Transfer: +2 for physical assistance, Moderate assistance, Stand-pivot Toileting- Clothing Manipulation and Hygiene: Moderate assistance Functional mobility during ADLs: Maximal assistance, +2 for physical assistance (ambulating 5 feet)  Cognition: Cognition Overall Cognitive Status: Within Functional Limits for tasks assessed Arousal/Alertness: Awake/alert Orientation Level: Oriented X4 Attention: Sustained Sustained Attention: Appears intact Memory: Appears intact Awareness: Appears intact Problem Solving: Appears intact Safety/Judgment: Appears intact Cognition Arousal/Alertness: Awake/alert Behavior During Therapy: Flat affect Overall Cognitive Status: Within Functional  Limits for tasks assessed General Comments: For basic tasks; following commands well.   Blood pressure (!) 161/122, pulse 76, temperature 98 F (36.7 C), temperature source Oral, resp. rate (!) 23, height 5\' 10"  (1.778 m), weight 102.1 kg (225 lb), SpO2 98 %. Physical Exam  Vitals reviewed. Constitutional: He is oriented to person, place, and time.  HENT:  Mild facial droop  Eyes: EOM are normal.  Neck: Normal range of motion. Neck supple. No thyromegaly present.  Cardiovascular: Normal rate, regular rhythm and normal heart sounds.   Respiratory: Effort normal and breath sounds normal. No respiratory distress.  GI: Soft. Bowel sounds are normal. He exhibits no distension.  Neurological: He is alert and oriented to person, place, and time.  Follows commands. Fair awareness of deficits  Skin: Skin is warm and dry.  Neuro:  Eyes without evidence of nystagmus  Tone is normal without evidence of spasticity Cerebellar exam shows no evidence of ataxia on finger nose finger or heel to shin testing No evidence of trunkal ataxia  Motor strength is 5/5 in R deltoid, biceps, triceps, finger flexors and extensors, wrist flexors and extensors, hip flexors, knee flexors and extensors, ankle dorsiflexors, plantar flexors, invertors and evertors, toe flexors and extensors 4/5 in L deltoid, biceps, triceps, finger flexors and extensors, wrist flexors and extensors, hip flexors, knee flexors and extensors, ankle dorsiflexors, plantar flexors, invertors and evertors, toe flexors and extensors Sensory exam redueced light touch in the LEFT upper and lower limbs   Cranial nerves II- Visual fields are intact to confrontation testing, no blurring of vision III- no evidence of ptosis, upward, downward and medial gaze intact IV- no vertical diplopia or head tilt V- no facial numbness or masseter weakness VI- no pupil abduction weakness VII- no facial droop, good lid closure VII- normal auditory  acuity IX- no pharygeal weakness, X- no pharyngeal weakness, no hoarseness XI- no trap or SCM weakness XII- no glossal weakness    Lab Results Last 24 Hours  No results found for this or any previous visit (from the past 24 hour(s)).    Imaging  Results (Last 48 hours)  Mr Brain W Contrast  Result Date: 03/20/2017 CLINICAL DATA:  Stroke follow-up.  Increased weakness EXAM: MRI HEAD WITH CONTRAST TECHNIQUE: Multiplanar, multiecho pulse sequences of the brain and surrounding structures were obtained with intravenous contrast. CONTRAST:  41mL MULTIHANCE GADOBENATE DIMEGLUMINE 529 MG/ML IV SOLN COMPARISON:  MRI head 03/19/2017 FINDINGS: Brain: Repeat diffusion is positive for acute infarct in the right paramedian pons. This is lateral and inferior to a large area of hemorrhage in the central pons. Numerous areas of chronic microhemorrhage again noted. Postcontrast imaging reveals no enhancing lesion in the brain. Image quality degraded by motion. Vascular: Normal arterial and venous enhancement. Skull and upper cervical spine: No enhancing skull lesion identified. Sinuses/Orbits: Negative Other: None IMPRESSION: Interval development of acute infarct in the right paramedian inferior pons extending to the medulla. This is not identified yesterday. Numerous areas of chronic hemorrhage throughout the brain as noted on the prior MRI. Normal enhancement on today's study. Electronically Signed   By: Franchot Gallo M.D.   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 Does the need for close, 24 hr/day medical supervision in concert with the patient's rehab needs make it unreasonable for this patient to be served in a less intensive setting? Yes 2. Co-Morbidities requiring supervision/potential complications: HTN uncontrolled 3. Due to bladder management, bowel management, safety, skin/wound care, disease management, medication administration,  pain management and patient education, does the patient require 24 hr/day rehab nursing? Yes 4. Does the patient require coordinated care of a physician, rehab nurse, PT (1-2 hrs/day, 5 days/week) and OT (1-2 hrs/day, 5 days/week) to address physical and functional deficits in the context of the above medical diagnosis(es)? Yes Addressing deficits in the following areas: balance, endurance, locomotion, strength, transferring, bowel/bladder control, bathing, dressing, feeding, grooming, toileting and psychosocial support 5. Can the patient actively participate in an intensive therapy program of at least 3 hrs of therapy per day at least 5 days per week? Yes and need BP under better control 6. The potential for patient to make measurable gains while on inpatient rehab is excellent 7. Anticipated functional outcomes upon discharge from inpatient rehab are modified independent  with PT, modified independent with OT, n/a with SLP. 8. Estimated rehab length of stay to reach the above functional goals is: 10-14d 9. Anticipated D/C setting: Home 10. Anticipated post D/C treatments: [place] therapy 11. Overall Rehab/Functional Prognosis: excellent  RECOMMENDATIONS: This patient's condition is appropriate for continued rehabilitative care in the following setting: CIR Patient has agreed to participate in recommended program. Yes Note that insurance prior authorization may be required for reimbursement for recommended care.  Comment: need po BP regimen and parameters for Sys and Diastolic BP   ANGIULLI,DANIEL J., PA-C 03/22/2017    Revision History

## 2017-03-23 NOTE — Progress Notes (Signed)
Physical Therapy Treatment Patient Details Name: MCKAY TEGTMEYER MRN: [id number] DOB: 1971/03/04 Today's Date: 03/23/2017    History of Present Illness 46 y.o. male with a past medical history significant for obesity and hypertension who presents to [facility] with complaints of severe dizziness, difficulty walking and subsequent left sided numbness and tingling that begins at his neck. He states that he was laying down, watching TV when the episode began. He denies double vision, but endorses blurry vision, particularly out of the left eye, MRI + acute infarct in the right paramedian pons. This is lateral and inferior to a large area of hemorrhage in the central pons..     PT Comments    Pt progressing towards physical therapy goals. Was able to perform transfers with +2 assist for balance support and safety. Pt was able to complete pre-gait training with RW at edge of chair, requiring increased support at the L knee to prevent buckling. Pt anticipates d/c to CIR this afternoon. Will continue to follow.   Follow Up Recommendations  CIR     Equipment Recommendations  Other (comment) (defer)    Recommendations for Other Services Rehab consult     Precautions / Restrictions Precautions Precautions: Fall Precaution Comments: SBP <180 Restrictions Weight Bearing Restrictions: No    Mobility  Bed Mobility Overal bed mobility: Needs Assistance Bed Mobility: Sit to Supine       Sit to supine: Min assist   General bed mobility comments: Pt was able to transition back to supine with assist for LLE elevation up onto bed. Was able to clear the bed with the RLE without assistance.   Transfers Overall transfer level: Needs assistance Equipment used: 2 person hand held assist;Rolling walker (2 wheeled) Transfers: Sit to/from Stand Sit to Stand: Min assist;+2 physical assistance Stand pivot transfers: Max assist;+2 physical assistance       General transfer comment: Pt performed sit<>stand x4  throughout session. Initially to RW. +2 max A to take pivotal steps around to the bed.   Ambulation/Gait Ambulation/Gait assistance: Max assist;+2 physical assistance Ambulation Distance (Feet): 2 Feet Assistive device: 2 person hand held assist Gait Pattern/deviations: Step-through pattern;Narrow base of support;Ataxic Gait velocity: decreased Gait velocity interpretation: Below normal speed for age/gender General Gait Details: Pt with heavy left lateral lean, difficulty sequencing movements for LE advancement. Left knee buckling during stance requiring support to prevent giving out. Ataxic like movement.    Stairs            Wheelchair Mobility    Modified Rankin (Stroke Patients Only) Modified Rankin (Stroke Patients Only) Pre-Morbid Rankin Score: No symptoms Modified Rankin: Moderately severe disability     Balance Overall balance assessment: Needs assistance Sitting-balance support: Feet supported Sitting balance-Leahy Scale: Fair Sitting balance - Comments: Able to sit EOB without support; right lateral lean bias. Attempts to self correct to midline but needs visual cues.  Postural control: Right lateral lean Standing balance support: During functional activity Standing balance-Leahy Scale: Poor Standing balance comment: Requires Max A of 2 for dynamic standing balance; able to stand statically with Min guard assist.                             Cognition Arousal/Alertness: Awake/alert Behavior During Therapy: Flat affect Overall Cognitive Status: Within Functional Limits for tasks assessed  Exercises      General Comments General comments (skin integrity, edema, etc.): Pt was able to complete pre-gait activity at the edge of the chair. Completed weight shifts, weight shift with heel lifts, and then marching in place. Pt required assist for increased weight shift, L knee block, and assist to align  feet after each march.       Pertinent Vitals/Pain Pain Assessment: Faces Faces Pain Scale: Hurts a little bit Pain Location: general discomfort Pain Descriptors / Indicators: Discomfort    Home Living                      Prior Function            PT Goals (current goals can now be found in the care plan section) Acute Rehab PT Goals Patient Stated Goal: to return home with family PT Goal Formulation: With patient Time For Goal Achievement: 04/04/17 Potential to Achieve Goals: Good Progress towards PT goals: Progressing toward goals    Frequency    Min 4X/week      PT Plan Current plan remains appropriate    Co-evaluation              AM-PAC PT "6 Clicks" Daily Activity  Outcome Measure  Difficulty turning over in bed (including adjusting bedclothes, sheets and blankets)?: None Difficulty moving from lying on back to sitting on the side of the bed? : None Difficulty sitting down on and standing up from a chair with arms (e.g., wheelchair, bedside commode, etc,.)?: Total Help needed moving to and from a bed to chair (including a wheelchair)?: A Lot Help needed walking in hospital room?: A Lot Help needed climbing 3-5 steps with a railing? : Total 6 Click Score: 14    End of Session Equipment Utilized During Treatment: Gait belt Activity Tolerance: Patient tolerated treatment well Patient left: in bed;with call bell/phone within reach;with bed alarm set Nurse Communication: Mobility status PT Visit Diagnosis: Unsteadiness on feet (R26.81);Difficulty in walking, not elsewhere classified (R26.2);Muscle weakness (generalized) (M62.81);Hemiplegia and hemiparesis Hemiplegia - Right/Left: Left Hemiplegia - dominant/non-dominant: Non-dominant Hemiplegia - caused by: Cerebral infarction     Time: 1211-1234 PT Time Calculation (min) (ACUTE ONLY): 23 min  Charges:  $Therapeutic Activity: 23-37 mins                    G Codes:       Rolinda Roan,  PT, DPT Acute Rehabilitation Services Pager: [phone]    Thelma Comp 03/23/2017, 2:24 PM

## 2017-03-23 NOTE — Progress Notes (Signed)
Retta Diones, RN Rehab Admission Coordinator Attested Physical Medicine and Rehabilitation  PMR Pre-admission Date of Service: 03/23/2017 1:16 PM  Related encounter: ED to Hosp-Admission (Current) from 03/18/2017 in [place] NEURO/TRAUMA/SURGICAL ICU     Attestation signed by Charlett Blake, MD at 03/23/2017 1:36 PM (Updated)  Pt seen and examined , chart reviewed stable for admit, per neuro ok to run BP up to 200 Sys and 284 diastolic for the next 5-7 d      [] Hide copied text PMR Admission Coordinator Pre-Admission Assessment  Patient: Zachary [name] is an 46 y.o., male MRN: [id number] DOB: 05-15-1971 Height: 5\' 10"  (177.8 cm) Weight: 102.1 kg (225 lb)                                                                                                                                           Insurance Information  Medicaid Application Date:        Case Manager:   Disability Application Date:        Case Worker:    Emergency [place]    Name Relation Home Work Mobile   No name specified       Statzer,Nicole Spouse   [phone]     Current Medical History  Patient Admitting Diagnosis: 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 Patient has not taken his blood pressure medication in approximately 9 months because of financial reasons. Blood pressure 238/136and placed on cleviprexand labetalol. Per chart review patient lives with spouseand 2 children ages 63 and 39. Independent prior to admission and works at [employer] in addition to a second job. 2 level home with bedroom upstairs.Wife works during the day.He presented 03/18/2017 with severe dizziness, left-sided weakness and numbness. Cranial CT scan negative. Patient did receive TPA. MRI showed acute infarction right paramedian inferior pons extending into the  medulla. 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 UDS negative.CT angio head and neck negative for emergent large vessel occlusion. 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 EEG negative for seizure. Neurology consulted currently on aspirin and Plavix for CVA prophylaxis. Findings of elevated hemoglobin A1c 8.1 with diabetic coordinator consulted. Tolerating a regular diet. Physical and occupational therapy evaluations completed with recommendations of physical medicine rehabilitation consult.    Total: 4=NIH  Past Medical History      Past Medical History:  Diagnosis Date  . Hypertension     Family History  family history is not on file.  Prior Rehab/Hospitalizations: No previous rehab  Has the patient had major surgery during 100 days prior to admission? No  Current Medications   Current Facility-Administered Medications:  .  0.9 %  sodium chloride infusion, , Intravenous, Continuous, Eshraghi, Shervin, MD, Last Rate: 10 mL/hr at 03/23/17 0700 .  acetaminophen (TYLENOL) tablet 650 mg, 650 mg, Oral, Q4H PRN, 650 mg at 03/20/17 1212 **OR** acetaminophen (TYLENOL) solution 650 mg, 650 mg, Per Tube, Q4H PRN **OR** acetaminophen (TYLENOL) suppository 650 mg, 650 mg, Rectal, Q4H PRN, Aroor, Karena Addison R, MD .  amLODipine (NORVASC) tablet 10 mg, 10 mg, Oral, Daily, Patteson, Samuel A, NP, 10 mg at 03/23/17 1006 .  aspirin EC tablet 81 mg, 81 mg, Oral, QHS, Wallie Char, 81 mg at 03/22/17 2146 .  atorvastatin (LIPITOR) tablet 20 mg, 20 mg, Oral, q1800, Rinehuls, David L, PA-C, 20 mg at 03/22/17 1753 .  clevidipine (CLEVIPREX) infusion 0.5 mg/mL, 0-21 mg/hr, Intravenous, Continuous, Patteson, Arlan Organ, NP, Stopped at 03/21/17 1624 .  clopidogrel (PLAVIX) tablet 75 mg, 75 mg, Oral, Daily, Garvin Fila, MD, 75 mg at 03/23/17 1006 .   hydrochlorothiazide (HYDRODIURIL) tablet 25 mg, 25 mg, Oral, Daily, Patteson, Samuel A, NP, 25 mg at 03/23/17 1006 .  ketorolac (TORADOL) 15 MG/ML injection 15 mg, 15 mg, Intravenous, Q6H PRN, Wallie Char, 15 mg at 03/21/17 2054 .  labetalol (NORMODYNE,TRANDATE) injection 10 mg, 10 mg, Intravenous, Q2H PRN, Aroor, Lanice Schwab, MD, 10 mg at 03/22/17 0817 .  living well with diabetes book MISC, , Does not apply, Once, Garvin Fila, MD .  metFORMIN (GLUCOPHAGE) tablet 500 mg, 500 mg, Oral, BID WC, Patteson, Samuel A, NP, 500 mg at 03/23/17 9379 .  pantoprazole (PROTONIX) EC tablet 40 mg, 40 mg, Oral, QHS, Rumbarger, Valeda Malm, RPH, 40 mg at 03/22/17 2146 .  topiramate (TOPAMAX) tablet 50 mg, 50 mg, Oral, BID, Patteson, Samuel A, NP, 50 mg at 03/23/17 1006  Patients Current Diet: Diet heart healthy/carb modified Room service appropriate? Yes; Fluid consistency: Thin  Precautions / Restrictions Precautions Precautions: Fall Precaution Comments: SBP <180 Restrictions Weight Bearing Restrictions: No   Has the patient had 2 or more falls or a fall with injury in the past year?No  Prior Activity Level Community (5-7x/wk): Went out daily.  Was working FT days at a call center.  Home Assistive Devices / Equipment Home Assistive Devices/Equipment: None Home Equipment: None  Prior Device Use: Indicate devices/aids used by the patient prior to current illness, exacerbation or injury? None  Prior Functional Level Prior Function Level of Independence: Independent Comments: worked at [employer] in addition to second job  Self Care: Did the patient need help bathing, dressing, using the toilet or eating?  Independent  Indoor Mobility: Did the patient need assistance with walking from room to room (with or without device)? Independent  Stairs: Did the patient need assistance with internal or external stairs (with or without device)? Independent  Functional Cognition: Did the  patient need help planning regular tasks such as shopping or remembering to take medications? Independent  Current Functional Level Cognition  Arousal/Alertness: Awake/alert Overall Cognitive Status: Within Functional Limits for tasks assessed Orientation Level: Oriented X4 General Comments: For basic tasks; following commands well.  Attention: Sustained Sustained Attention: Appears intact Memory: Appears intact Awareness: Appears intact Problem Solving: Appears intact Safety/Judgment: Appears intact    Extremity Assessment (includes Sensation/Coordination)  Upper Extremity Assessment: Defer to OT evaluation LUE Deficits / Details: AROM WFL; incoordination. Pt requires cues to use LUE at times. Able to complete functional movement patterns. Greater difficulty with fine motor skills. "clumsy hand" LUE Sensation: decreased light touch, decreased proprioception LUE Coordination: decreased fine motor,  decreased gross motor  Lower Extremity Assessment: LLE deficits/detail LLE Deficits / Details: Grossly ~3/5 throughout knee extensors, knee flexors, 2+/5 hip flexion, ankle DF. LLE Sensation: decreased light touch, decreased proprioception LLE Coordination: decreased fine motor, decreased gross motor    ADLs  Overall ADL's : Needs assistance/impaired Eating/Feeding: Set up Grooming: Set up, Sitting Upper Body Bathing: Set up, Supervision/ safety, Sitting Lower Body Bathing: Moderate assistance, Sit to/from stand Upper Body Dressing : Moderate assistance, Sitting Lower Body Dressing: Moderate assistance, Sit to/from stand Toilet Transfer: +2 for physical assistance, Moderate assistance, Stand-pivot Toileting- Clothing Manipulation and Hygiene: Moderate assistance Functional mobility during ADLs: Maximal assistance, +2 for physical assistance (ambulating 5 feet)    Mobility  Overal bed mobility: Needs Assistance Bed Mobility: Sidelying to Sit, Rolling Rolling: Min  guard Sidelying to sit: Min guard, HOB elevated General bed mobility comments: Able to get to EOB without assist, increased time. Able to push through LUE with cues.     Transfers  Overall transfer level: Needs assistance Equipment used: 2 person hand held assist Transfers: Sit to/from Stand Sit to Stand: Min assist, +2 physical assistance Stand pivot transfers: Max assist, +2 physical assistance General transfer comment: Assist to power to standing with cues for hand placement for WB through LUE/LE. Stood from [employer], from chair x1. No dizziness.     Ambulation / Gait / Stairs / Wheelchair Mobility  Ambulation/Gait Ambulation/Gait assistance: Max assist, +2 physical assistance Ambulation Distance (Feet): 4 Feet Assistive device: 2 person hand held assist Gait Pattern/deviations: Step-through pattern, Narrow base of support, Ataxic General Gait Details: Pt with heavy left lateral lean, difficulty sequencing movements for LE advancement. Left knee buckling during stance requiring support to prevent giving out. Ataxic like movement.  Gait velocity: decreased Gait velocity interpretation: Below normal speed for age/gender    Posture / Balance Dynamic Sitting Balance Sitting balance - Comments: Able to sit EOB without support; right lateral lean bias. Attempts to self correct to midline but needs visual cues.  Balance Overall balance assessment: Needs assistance Sitting-balance support: Feet supported Sitting balance-Leahy Scale: Fair Sitting balance - Comments: Able to sit EOB without support; right lateral lean bias. Attempts to self correct to midline but needs visual cues.  Postural control: Right lateral lean Standing balance support: During functional activity Standing balance-Leahy Scale: Poor Standing balance comment: Requires MAx A of 2 for dynamic standing balance; able to stand statically with Min guard assist.     Special needs/care consideration BiPAP/CPAP No CPM  No Continuous Drip IV KVO Dialysis No       Life Vest No Oxygen No Special Bed No Trach Size No Wound Vac (area) No     Skin No                          Bowel mgmt: Last BM 03/23/17 Bladder mgmt: Voiding in urinal Diabetic mgmt Yes, new diabetic with Hgb A1C 8.1    Previous Home Environment Living Arrangements: Spouse/significant other Available Help at Discharge: Family, Available 24 hours/day Type of Home: House Home Layout: Two level, Bed/bath upstairs Alternate Level Stairs-Number of Steps: 1 flight Home Access: Level entry Bathroom Shower/Tub: Chiropodist: Standard Home Care Services: No  Discharge Living Setting Plans for Discharge Living Setting: Patient's home, House, Lives with (comment) (Lives with wife, 78 yo and 25 yo children.) Type of Home at Discharge: House Discharge Home Layout: Two level, 1/2 bath on main level, Bed/bath upstairs  Alternate Level Stairs-Number of Steps: about 15 steps Discharge Home Access: Stairs to enter Entrance Stairs-Number of Steps: 2 steps at front entry. Does the patient have any problems obtaining your medications?: No  Social/Family/Support Systems Patient Roles: Spouse, Parent (Has a wife, children ages 43, 88, 49 yo.) Contact Information: Shellie Goettl - wife - [phone] Anticipated Caregiver: Wife and wife's cousin, Juliann Pulse Ability/Limitations of Caregiver: Wife works 8 am to 4 pm daily.  Wife's cousin, Juliann Pulse, works nights but can check on patient and assist prn. Caregiver Availability: Intermittent Discharge Plan Discussed with Primary Caregiver: Yes Is Caregiver In Agreement with Plan?: Yes Does Caregiver/Family have Issues with Lodging/Transportation while Pt is in Rehab?: No  Goals/Additional Needs Patient/Family Goal for Rehab: PT/OT mod I goals Expected length of stay: 10-14 days Cultural Considerations: None Dietary Needs: Heart healthy, carb modified, thin liquids Equipment Needs:  TBD Pt/Family Agrees to Admission and willing to participate: Yes Program Orientation Provided & Reviewed with Pt/Caregiver Including Roles  & Responsibilities: Yes  Decrease burden of Care through IP rehab admission: N/A  Possible need for SNF placement upon discharge: Not planned  Patient Condition: This patient's condition remains as documented in the consult dated 03/22/17, in which the Rehabilitation Physician determined and documented that the patient's condition is appropriate for intensive rehabilitative care in an inpatient rehabilitation facility. Will admit to inpatient rehab today.  Preadmission Screen Completed By:  Retta Diones, 03/23/2017 1:25 PM ______________________________________________________________________   Discussed status with Dr. Letta Pate on 03/23/17 at 1324 and received telephone approval for admission today.  Admission Coordinator:  Retta Diones, time 1324/Date 03/23/17       Cosigned by: Charlett Blake, MD at 03/23/2017 1:36 PM  Revision History

## 2017-03-23 NOTE — Progress Notes (Signed)
Rehab admissions - I met with patient.  I gave him rehab booklets and rehab letter.  He would like to admit to inpatient rehab.  He has no insurance.  He does work, but his insurance was not due to become effective for the next few days.  I have a bed available today and will admit to acute inpatient rehab today.  Call me for questions.  #[phone]

## 2017-03-24 ENCOUNTER — Inpatient Hospital Stay (HOSPITAL_COMMUNITY): Payer: Self-pay | Admitting: Physical Therapy

## 2017-03-24 ENCOUNTER — Inpatient Hospital Stay (HOSPITAL_COMMUNITY): Payer: Self-pay | Admitting: Occupational Therapy

## 2017-03-24 ENCOUNTER — Inpatient Hospital Stay (HOSPITAL_COMMUNITY): Payer: Self-pay

## 2017-03-24 DIAGNOSIS — E119 Type 2 diabetes mellitus without complications: Secondary | ICD-10-CM

## 2017-03-24 DIAGNOSIS — I6350 Cerebral infarction due to unspecified occlusion or stenosis of unspecified cerebral artery: Secondary | ICD-10-CM

## 2017-03-24 DIAGNOSIS — E785 Hyperlipidemia, unspecified: Secondary | ICD-10-CM

## 2017-03-24 DIAGNOSIS — I10 Essential (primary) hypertension: Secondary | ICD-10-CM

## 2017-03-24 DIAGNOSIS — E876 Hypokalemia: Secondary | ICD-10-CM

## 2017-03-24 DIAGNOSIS — E871 Hypo-osmolality and hyponatremia: Secondary | ICD-10-CM

## 2017-03-24 DIAGNOSIS — N179 Acute kidney failure, unspecified: Secondary | ICD-10-CM

## 2017-03-24 LAB — COMPREHENSIVE METABOLIC PANEL
ALT: 16 U/L — AB (ref 17–63)
ANION GAP: 13 (ref 5–15)
AST: 17 U/L (ref 15–41)
Albumin: 4.1 g/dL (ref 3.5–5.0)
Alkaline Phosphatase: 74 U/L (ref 38–126)
BUN: 26 mg/dL — ABNORMAL HIGH (ref 6–20)
CALCIUM: 9.6 mg/dL (ref 8.9–10.3)
CO2: 23 mmol/L (ref 22–32)
Chloride: 97 mmol/L — ABNORMAL LOW (ref 101–111)
Creatinine, Ser: 1.37 mg/dL — ABNORMAL HIGH (ref 0.61–1.24)
GFR, EST NON AFRICAN AMERICAN: 60 mL/min — AB (ref >60)
Glucose, Bld: 130 mg/dL — ABNORMAL HIGH (ref 65–99)
Potassium: 3.2 mmol/L — ABNORMAL LOW (ref 3.5–5.1)
Sodium: 133 mmol/L — ABNORMAL LOW (ref 135–145)
TOTAL PROTEIN: 7.6 g/dL (ref 6.5–8.1)
Total Bilirubin: 0.8 mg/dL (ref 0.3–1.2)

## 2017-03-24 LAB — CBC WITH DIFFERENTIAL/PLATELET
Basophils Absolute: 0 10*3/uL (ref 0.0–0.1)
Basophils Relative: 0 %
EOS ABS: 0.1 10*3/uL (ref 0.0–0.7)
EOS PCT: 1 %
HCT: 44.4 % (ref 39.0–52.0)
HEMOGLOBIN: 15.5 g/dL (ref 13.0–17.0)
LYMPHS ABS: 3.7 10*3/uL (ref 0.7–4.0)
LYMPHS PCT: 36 %
MCH: 27.7 pg (ref 26.0–34.0)
MCHC: 34.9 g/dL (ref 30.0–36.0)
MCV: 79.4 fL (ref 78.0–100.0)
MONOS PCT: 7 %
Monocytes Absolute: 0.8 10*3/uL (ref 0.1–1.0)
NEUTROS PCT: 56 %
Neutro Abs: 5.7 10*3/uL (ref 1.7–7.7)
Platelets: 369 10*3/uL (ref 150–400)
RBC: 5.59 MIL/uL (ref 4.22–5.81)
RDW: 12.9 % (ref 11.5–15.5)
WBC: 10.3 10*3/uL (ref 4.0–10.5)

## 2017-03-24 LAB — GLUCOSE, CAPILLARY
GLUCOSE-CAPILLARY: 131 mg/dL — AB (ref 65–99)
GLUCOSE-CAPILLARY: 162 mg/dL — AB (ref 65–99)
Glucose-Capillary: 142 mg/dL — ABNORMAL HIGH (ref 65–99)
Glucose-Capillary: 119 mg/dL — ABNORMAL HIGH (ref 65–99)

## 2017-03-24 MED ORDER — POTASSIUM CHLORIDE CRYS ER 20 MEQ PO TBCR
20.0000 meq | EXTENDED_RELEASE_TABLET | Freq: Every day | ORAL | Status: DC
  Administered 2017-03-24 – 2017-03-27 (×4): 20 meq via ORAL
  Filled 2017-03-24 (×4): qty 1

## 2017-03-24 MED ORDER — POTASSIUM CHLORIDE CRYS ER 20 MEQ PO TBCR
30.0000 meq | EXTENDED_RELEASE_TABLET | Freq: Two times a day (BID) | ORAL | Status: AC
  Administered 2017-03-24 (×2): 30 meq via ORAL
  Filled 2017-03-24 (×2): qty 1

## 2017-03-24 NOTE — Progress Notes (Signed)
Physical Therapy Session Note  Patient Details  Name: Zachary [name] MRN: [id number] Date of Birth: 05/17/1971  Today's Date: 03/24/2017 PT Individual Time: 1615-1700 PT Individual Time Calculation (min): 45 min   Short Term Goals: Week 1:  PT Short Term Goal 1 (Week 1): Pt will perform bed to chair transfer with min assist. PT Short Term Goal 2 (Week 1): Pt will ambulate 5 ft wtih mod assist of 1 with LRAD. PT Short Term Goal 3 (Week 1): Pt will stand x 2 min without LOB with min assist while performing functional UE activity.  Skilled Therapeutic Interventions/Progress Updates:    Pt supine in bed upon PT arrival, agreeable to therapy tx and denies pain this session. Pt transferred supine to sitting EOB with supervision. Pt transferred from bed to w/c squat pivot with min assist, verbal cues for set up of w/c and to remove arm rest. Pt propelled w/c x 150 ft to the gym with min assist for steering. Session focused on pre gait and L LE NMR. Pt performed sit to stand throughout session x 4 with focus on no UE support, set up of feet, and equal weightbearing through each leg. Pt standing using R handrail for UE support performed step to/step back onto number on floor 2 x 10 with each leg to focus on stance and swing of L LE, pt needing facilitation for L knee extension and L lateral weightshift during stance. Pt performed 1 x 10 mini squat without UEs on knees to decrease UE support, facilitation for L lateral weightshift as pt tends to keep weight over R LE and verbal cues for knee/hip extension when coming back up to standing. Pt left seated in w/c in room at end of session with need in reach.   Therapy Documentation Precautions:  Precautions Precautions: Fall Precaution Comments:  (BP runs elevated; Per [name], PA-C; No precautions for BP at this time. ) Restrictions Weight Bearing Restrictions: No   See Function Navigator for Current Functional Status.   Therapy/Group: Individual  Therapy  Netta Corrigan, PT, DPT 03/24/2017, 5:23 PM

## 2017-03-24 NOTE — Evaluation (Signed)
Occupational Therapy Assessment and Plan  Patient Details  Name: Zachary [name] MRN: [id number] Date of Birth: 07/12/1971  OT Diagnosis: hemiplegia affecting non-dominant side, muscle weakness (generalized) and coordination disorder Rehab Potential: Rehab Potential (ACUTE ONLY): Excellent ELOS: 17-19 days    Today's Date: 03/24/2017 OT Individual Time: 2694-8546 OT Individual Time Calculation (min): 77 min    Problem List:  Patient Active Problem List   Diagnosis Date Noted  . Hyperlipidemia LDL goal <70   . Hypokalemia   . Hyponatremia   . AKI (acute kidney injury) ([place])   . Diabetes mellitus type 2 in nonobese (HCC)   . Benign essential HTN   . Hemiparesis affecting left side as late effect of cerebrovascular accident (CVA) ([place]) 03/23/2017  . Gait disturbance, post-stroke 03/23/2017  . Right pontine cerebrovascular accident ([place]) 03/23/2017  . Left pontine stroke (HCC) - Right paramedian pontine infarct likely secondary to small vessel disease versus symptomatic terminal right vertebral artery stenosis. 03/18/2017    Past Medical History:  Past Medical History:  Diagnosis Date  . Hypertension    Past Surgical History: History reviewed. No pertinent surgical history.  Assessment & Plan Clinical Impression: Zachary [name] a 46 y.o.right handed malewith reported history of obesity, hypertension. Patient has not taken his blood pressure medication in approximately 9 months because of financial reasons. Blood pressure 238/136and placed on cleviprexand labetalol. Per chart review patient lives with spouseand 2 children ages 48 and 35. Independent prior to admission and works at [employer] in addition to a second job. 2 level home with bedroom upstairs.Wife works during the day.He presented 03/18/2017 with severe dizziness, left-sided weakness and numbness. Cranial CT scan negative. Patient did receive TPA. MRI showed acute infarction right paramedian inferior pons  extending into the medulla. 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 UDS negative.CT angio head and neck negative for emergent large vessel occlusion. 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 EEG negative for seizure. Neurology consulted currently on aspirin and Plavix for CVA prophylaxis. Findings of elevated hemoglobin A1c 8.1 with diabetic coordinator consulted. Intermittent bouts of headaches with Topamax initiated. Tolerating a regular diet. Physical and occupational therapy evaluations completed with recommendations of physical medicine rehabilitation consult.Patient was admitted for a comprehensive rehabilitation program  Patient currently requires mod with basic self-care skills secondary to muscle weakness, decreased cardiorespiratoy endurance, impaired timing and sequencing, unbalanced muscle activation and decreased coordination and decreased standing balance, decreased postural control and hemiplegia.  Prior to hospitalization, patient could complete BADLs with independent .  Patient will benefit from skilled intervention to increase independence with basic self-care skills prior to discharge home with family.  Anticipate patient will require intermittent supervision and follow up home health.  OT - End of Session Endurance Deficit: Yes OT Assessment Rehab Potential (ACUTE ONLY): Excellent OT Barriers to Discharge: Decreased caregiver support OT Patient demonstrates impairments in the following area(s): Balance;Safety;Endurance;Motor;Sensory OT Basic ADL's Functional Problem(s): Grooming;Bathing;Dressing;Toileting OT Advanced ADL's Functional Problem(s): Simple Meal Preparation OT Transfers Functional Problem(s): Toilet;Tub/Shower OT Additional Impairment(s): Fuctional Use of Upper Extremity OT Plan OT Intensity: Minimum of 1-2 x/day, 45  to 90 minutes OT Frequency: 5 out of 7 days OT Duration/Estimated Length of Stay: 17-19 days OT Treatment/Interventions: Balance/vestibular training;Discharge planning;Functional electrical stimulation;Self Care/advanced ADL retraining;Therapeutic Activities;UE/LE Coordination activities;Cognitive remediation/compensation;Functional mobility training;Patient/family education;Therapeutic Exercise;DME/adaptive equipment instruction;Neuromuscular re-education;Psychosocial support;UE/LE Strength taining/ROM;Wheelchair propulsion/positioning OT Self Feeding Anticipated Outcome(s): N/A  OT Basic Self-Care Anticipated Outcome(s): Supervision-Mod I  OT Toileting Anticipated Outcome(s): Supervision  OT Bathroom Transfers Anticipated Outcome(s): Supervision  OT Recommendation Recommendations for Other Services: Therapeutic Recreation consult Therapeutic Recreation Interventions: Pet therapy Patient destination: Home Follow Up Recommendations: Home health OT;Outpatient OT Equipment Recommended: To be determined   Skilled Therapeutic Intervention Skilled OT session completed with focus on initial evaluation, education on OT role/POC, and establishment of patient-centered goals.   Pt greeted in w/c, agreeable to shower. Pt completed stand pivot transfer onto TTB with Mod A for facilitating L LE extension (due to buckling). IV covered. Pt bathing while seated, using L UE as gross assist for washing Rt side of body. Pt actively attending to R/L aspects of body without cues. Pt reported decreased sensation in Lt but able to distinguish hot/cold on affected side. Mod A sit<stand while OT completed pericare. L UE weightbearing on grab bar. Pt then completed squat pivot back to w/c with instruction, retrieved clothing from drawers w/c level, selecting with L UE and scanning to Lt side with min cues. He then proceeded to dress w/c level at sink. Pt requiring max vcs to extend L LE while standing for clothing mgt. Pt  lifting pants over hips with bilateral UEs and extra time with balance support from OT. Footwear completed with pt instructed on figure 4 position (pt reports his laces are permanently tied). Mod cues for integrating L UE during grooming tasks instead of compensating with R UE. Pt then completed stand pivot transfer to recliner. He was left with all needs within reach at time of departure.   He reported no dizziness, visual changes, or pain during tx.  Rest breaks provided throughout due to fatigue  OT Evaluation Precautions/Restrictions  Precautions Precautions: Fall Precaution Comments:  (Left hemiparesis) Restrictions Weight Bearing Restrictions: No General Chart Reviewed: Yes Family/Caregiver Present: No Home Living/Prior Functioning Home Living Family/patient expects to be discharged to:: Private residence Living Arrangements: Spouse/significant other, Children Available Help at Discharge: Family, Available PRN/intermittently Type of Home: House Home Access: Stairs to enter Technical brewer of Steps: 2 Entrance Stairs-Rails: None Home Layout: Two level, Bed/bath upstairs Alternate Level Stairs-Number of Steps: 15 Alternate Level Stairs-Rails: Right Bathroom Shower/Tub: Chiropodist: Standard  Lives With: Spouse, Family IADL History Homemaking Responsibilities: Yes Meal Prep Responsibility: Secondary Laundry Responsibility: Secondary Cleaning Responsibility: Secondary Child Care Responsibility: Secondary Homemaking Comments: Also was completing daily pet care for his dog Occupation: Full time employment Type of Occupation: 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  Leisure and Hobbies: Spending time with family  Prior Function Level of Independence: Independent with homemaking with ambulation, Independent with transfers, Independent with gait, Independent with basic ADLs  Able to Take Stairs?: Reciprically Driving: Yes Vocation: Full  time employment Comments: 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 ADL ADL ADL Comments: Please see functional navigator for ADL status Vision Baseline Vision/History: No visual deficits Patient Visual Report: No change from baseline Vision Assessment?: No apparent visual deficits Perception  Perception: Within Functional Limits Comments: Pt actively attending to L/R body areas and integrating bilateral UEs during OT evaluation  Praxis Praxis: Intact Cognition Overall Cognitive Status: Within Functional Limits for tasks assessed Arousal/Alertness: Awake/alert Orientation Level: Person;Place;Situation Person: Oriented Place: Oriented Situation: Oriented Year: 2018 Month: August Day of Week: Correct Memory: Appears intact Immediate Memory Recall: Sock;Blue;Bed Memory Recall: Sock;Blue;Bed Memory Recall Sock: Without Cue Memory Recall Blue: Without Cue Memory Recall  Bed: Without Cue Attention: Sustained Sustained Attention: Appears intact Awareness: Appears intact Problem Solving: Appears intact Safety/Judgment: Appears intact Sensation Sensation Light Touch: Impaired Detail Light Touch Impaired Details: Impaired LUE Stereognosis: Not tested Hot/Cold: Appears Intact Proprioception: Impaired Detail Proprioception Impaired Details: Impaired LUE Coordination Gross Motor Movements are Fluid and Coordinated: No Fine Motor Movements are Fluid and Coordinated: No Coordination and Movement Description:  (Affected by Lt hemiparesis + proprioceptive deficits on Lt side) Motor  Motor Motor: Hemiplegia Mobility  Transfers Transfers: Sit to Stand;Stand to Sit Sit to Stand: 3: Mod assist Stand to Sit: 3: Mod assist  Trunk/Postural Assessment  Cervical Assessment Cervical Assessment: Within Functional Limits Thoracic Assessment Thoracic Assessment: Exceptions to [facility] (kyphotic ) Lumbar Assessment Lumbar Assessment: Exceptions to [facility]  (posterior pelvic tilt) Postural Control Postural Control: Deficits on evaluation  Balance Balance Balance Assessed: Yes Dynamic Sitting Balance Dynamic Sitting - Balance Support: Feet supported Dynamic Sitting - Level of Assistance: 5: Stand by assistance Dynamic Sitting Balance - Compensations: LB bathing/dressing  Dynamic Standing Balance Dynamic Standing - Level of Assistance: 3: Mod assist (during LB bathing/dressing) Extremity/Trunk Assessment RUE Assessment RUE Assessment: Within Functional Limits LUE Assessment LUE Assessment: Exceptions to [facility] deficits, impeding ability to complete grooming tasks and dressing without supervision/touching A due to tendency to drop items)   See Function Navigator for Current Functional Status.   Refer to Care Plan for Long Term Goals  Recommendations for other services: Therapeutic Recreation  Pet therapy   Discharge Criteria: Patient will be discharged from OT if patient refuses treatment 3 consecutive times without medical reason, if treatment goals not met, if there is a change in medical status, if patient makes no progress towards goals or if patient is discharged from hospital.  The above assessment, treatment plan, treatment alternatives and goals were discussed and mutually agreed upon: by patient  Skeet Simmer 03/24/2017, 9:07 PM

## 2017-03-24 NOTE — Progress Notes (Signed)
Zachary [name] PHYSICAL MEDICINE & REHABILITATION     PROGRESS NOTE  Subjective/Complaints:  Pt seen laying in bed this AM.  He slept well overnight and is ready to begin therapies.    ROS: Denies CP, SOB, N/V/D.  Objective: Vital Signs: Blood pressure (!) 175/106, pulse 89, temperature 98.5 F (36.9 C), temperature source Oral, resp. rate 16, SpO2 99 %. No results found.  Recent Labs  03/24/17 0457  WBC 10.3  HGB 15.5  HCT 44.4  PLT 369    Recent Labs  03/24/17 0457  NA 133*  K 3.2*  CL 97*  GLUCOSE 130*  BUN 26*  CREATININE 1.37*  CALCIUM 9.6   CBG (last 3)   Recent Labs  03/24/17 0631  GLUCAP 131*    Wt Readings from Last 3 Encounters:  03/18/17 102.1 kg (225 lb)    Physical Exam:  BP (!) 175/106 (BP Location: Left Arm)   Pulse 89   Temp 98.5 F (36.9 C) (Oral)   Resp 16   SpO2 99%  Constitutional: NAD. He appears well-developed and well-nourished.  HENT: Normocephalic. Atraumatic.  Eyes: EOMare normal. No discharge.  Cardiovascular: Normal rate, regular rhythm. No JVD. Respiratory: Effort normaland breath sounds normal.  GI: Bowel sounds are normal. He exhibits no distension.  Neurological. Alert and oriented Mild facial droop Motor: RUE/RLE: 5/5 proximal to distal LUE/LLE: 4/5 proximal to distal Sensation diminished to light touch in LLE Skin. Warm and dry. Intact.  Psych: Normal mood and behavior.   Assessment/Plan: 1. Functional deficits secondary to right pontine small vessel infarct which require 3+ hours per day of interdisciplinary therapy in a comprehensive inpatient rehab setting. Physiatrist is providing close team supervision and 24 hour management of active medical problems listed below. Physiatrist and rehab team continue to assess barriers to discharge/monitor patient progress toward functional and medical goals.  Function:  Bathing Bathing position      Bathing parts      Bathing assist        Upper Body  Dressing/Undressing Upper body dressing                    Upper body assist        Lower Body Dressing/Undressing Lower body dressing                                  Lower body assist        Toileting Toileting          Toileting assist     Transfers Chair/bed transfer   Chair/bed transfer method: Squat pivot Chair/bed transfer assist level: Moderate assist (Pt 50 - 74%/lift or lower) Chair/bed transfer assistive device: Bedrails, Armrests     Locomotion Ambulation     Max distance:  (10 ft) Assist level: 2 helpers   Wheelchair   Type: Manual Max wheelchair distance:  (150 ft) Assist Level: Touching or steadying assistance (Pt > 75%)  Cognition Comprehension Comprehension assist level: Follows complex conversation/direction with extra time/assistive device  Expression Expression assist level: Expresses complex ideas: With extra time/assistive device  Social Interaction Social Interaction assist level: Interacts appropriately with others with medication or extra time (anti-anxiety, antidepressant).  Problem Solving Problem solving assist level: Solves complex 90% of the time/cues < 10% of the time  Memory Memory assist level: Recognizes or recalls 90% of the time/requires cueing < 10% of the time    Medical Problem  List and Plan: 1. Left hemiparesissecondary to right pontine small vessel infarct on 8/4  Begin CIR  Notes reviewed, images reviewed 2. DVT Prophylaxis/Anticoagulation: SCDs. Monitor for any signs of DVT 3. Pain Management/headaches: Topamax 50 mg twice a day 4. Mood: Provide emotional support 5. Neuropsych: This patient iscapable of making decisions on hisown behalf. 6. Skin/Wound Care: Routine skin checks 7. Fluids/Electrolytes/Nutrition: Routine I&Os 8.Hypertension.   Currently maintained on HCTZ 25 mg daily, Norvasc 10 mg daily.   Monitor withincreased mobility 9. Diabetes mellitus. Hemoglobin A1c 8.1. Glucophage 500  mg twice a day. Check blood sugars before meals and at bedtime. Diabetic teaching  Monitor with increased mobility 10.Hyperlipidemia. Lipitor 11.Obesity. BMI 32.28 kg/m2 12. Hyponatremia  Na+ 133 on 8/10  Cont to monitor 13. AKI  Cr 1.37 on 8/10  Encourage fluids  Cont to monitor 14. Hypokalemia  K+ 3.2 on 8/10  Supplementing x1 day  Cont to monitor   LOS (Days) 1 A FACE TO FACE EVALUATION WAS PERFORMED  Zachary [name] Zachary [name] 03/24/2017 10:12 AM

## 2017-03-24 NOTE — Progress Notes (Signed)
Physical Therapy Session Note  Patient Details  Name: Zachary [name] MRN: [id number] Date of Birth: [birth year]  Today's Date: 03/24/2017 PT Individual Time: 9536-9223 PT Individual Time Calculation (min): 43 min   Short Term Goals: Week 1:  PT Short Term Goal 1 (Week 1): Pt will perform bed to chair transfer with min assist. PT Short Term Goal 2 (Week 1): Pt will ambulate 5 ft wtih mod assist of 1 with LRAD. PT Short Term Goal 3 (Week 1): Pt will stand x 2 min without LOB with min assist while performing functional UE activity.  Skilled Therapeutic Interventions/Progress Updates:   Pt in recliner upon arrival and agreeable to therapy, no c/o pain. Worked on functional mobility and LE strengthening this session. Pt performed recliner<>w/c and car transfer w/ Mod A via R HHA and assist for balance. Verbal cues for technique and sequencing of transfers. Pt also negotiated 4 steps w/ Mod A for balance and verbal cues for technique and sequencing. Instructed pt and performed LLE strengthening exercises including LAQs in seated, terminal knee extension in stance, resisted hamstring curls, and partial knee bends in stance. Standing exercises performed w/ UE support on RW and Min guard. Ended session supine in bed, call bell within reach and all needs met.   Therapy Documentation Precautions:  Precautions Precautions: Fall Precaution Comments:  (BP runs elevated; Per [name], PA-C; No precautions for BP at this time. ) Restrictions Weight Bearing Restrictions: No Vital Signs: Therapy Vitals Temp: 97.8 F (36.6 C) Temp Source: Oral Pulse Rate: 97 Resp: 16 BP: (!) 162/105 Patient Position (if appropriate): Sitting Oxygen Therapy SpO2: 98 % O2 Device: Not Delivered  See Function Navigator for Current Functional Status.   Therapy/Group: Individual Therapy  [name] K Arnette 03/24/2017, 3:12 PM

## 2017-03-24 NOTE — Progress Notes (Signed)
Inpatient [facility] Individual Statement of Services  Patient Name:  Zachary [name]  Date:  03/24/2017  Welcome to the [facility].  Our goal is to provide you with an individualized program based on your diagnosis and situation, designed to meet your specific needs.  With this comprehensive rehabilitation program, you will be expected to participate in at least 3 hours of rehabilitation therapies Monday-Friday, with modified therapy programming on the weekends.  Your rehabilitation program will include the following services:  Physical Therapy (PT), Occupational Therapy (OT), Speech Therapy (ST), 24 hour per day rehabilitation nursing, Neuropsychology, Case Management (Social Worker), Rehabilitation Medicine, Nutrition Services and Pharmacy Services  Weekly team conferences will be held on Tuesdays to discuss your progress.  Your Social Worker will talk with you frequently to get your input and to update you on team discussions.  Team conferences with you and your family in attendance may also be held.  Expected length of stay:  17 to 21 days  Overall anticipated outcome:  Supervision  Depending on your progress and recovery, your program may change. Your Social Worker will coordinate services and will keep you informed of any changes. Your Social Worker's name and contact numbers are listed  below.  The following services may also be recommended but are not provided by the [facility] will be made to provide these services after discharge if needed.  Arrangements include referral to agencies that provide these services.  Your insurance has been verified to be:  None at this time - financial counselor referral made Your primary doctor is:  None at this time - refer to be made at discharge to [facility] and [facility]  Pertinent information will be shared with your doctor and your insurance company.  Social Worker:  Alfonse Alpers, LCSW  [phone] or ([phone]  Information discussed with and copy given to patient by: Trey Sailors, 03/24/2017, 12:19 PM

## 2017-03-24 NOTE — Progress Notes (Signed)
Social Work Assessment and Plan  Patient Details  Name: Zachary [name] MRN: [id number] Date of Birth: [birth year]  Today's Date: 03/24/2017  Problem List:  Patient Active Problem List   Diagnosis Date Noted  . Hyperlipidemia LDL goal <70   . Hypokalemia   . Hyponatremia   . AKI (acute kidney injury) ([place])   . Diabetes mellitus type 2 in nonobese (HCC)   . Benign essential HTN   . Hemiparesis affecting left side as late effect of cerebrovascular accident (CVA) ([place]) 03/23/2017  . Gait disturbance, post-stroke 03/23/2017  . Right pontine cerebrovascular accident ([place]) 03/23/2017  . Left pontine stroke (HCC) - Right paramedian pontine infarct likely secondary to small vessel disease versus symptomatic terminal right vertebral artery stenosis. 03/18/2017   Past Medical History:  Past Medical History:  Diagnosis Date  . Hypertension    Past Surgical History: History reviewed. No pertinent surgical history. Social History:  reports that he has never smoked. He has never used smokeless tobacco. He reports that he does not drink alcohol or use drugs.  Family / Support Systems Marital Status: Married How Long?: 20 years Patient Roles: Spouse, Parent Spouse/Significant Other: Zachary [name] - wife - [phone] Children: 48 y/o, 10 y/o dtr Web designer at [employer] - Primary school teacher program), 63 y/o son Theatre manager at [employer]) Anticipated Caregiver: Wife and wife's cousin, Zachary [name] Ability/Limitations of Caregiver: Per pt, wife works 8 am to 4 pm daily.  Wife's cousin, Zachary [name], works nights but can check on patient and assist prn.  CSW to confirm. 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  He said that his employer is aware of the situation and his job is secure for the time. Legal History/Current Legal Issues: none reported Guardian/Conservator: N/A - MD has determined that pt is capable of making his own decisions.   Abuse/Neglect Physical Abuse: Denies Verbal Abuse: Denies Sexual Abuse: Denies Exploitation of patient/patient's resources: Yes, present (Comment) Self-Neglect: Denies Possible abuse reported to:: Other (Comment)  Emotional Status Pt's affect, behavior and adjustment status: Pt reports times of feeling down, but then he is able to pull himself back up to keep going.  His family motivates him and is his driving force to get better.  He acknowledges that he needs to take this time to get better. Recent Psychosocial Issues: Pt and wife have both been working 2 jobs to make ends meet.  Financial stressors have been ongoing even PTA and now with pt being out of work, he feels even more concerned. Psychiatric History: none reported Substance Abuse History: none reported  Patient / Family Perceptions, Expectations & Goals Pt/Family understanding of illness & functional limitations: Pt reported a good understanding from the doctors about his condition and limitations.  He just worries about how long it will take to get well. Premorbid pt/family roles/activities: Pt was mostly working and enjoyed spending time with his family.  He is also a Sports coach. Anticipated changes in roles/activities/participation: Pt wants to resume activities/roles as soon as he is able.  He looks forward to being home with his family. Pt/family expectations/goals: Pt wants to get better so he can be there for his  family.  Community Resources Express Scripts: None Premorbid Home Care/DME Agencies: None Transportation available at discharge: family Resource referrals recommended:  Neuropsychology, Support group (specify)  Discharge Planning Living Arrangements: Spouse/significant other, Children Support Systems: Spouse/significant other, Children, Other relatives Type of Residence: Private residence Insurance Resources: Teacher, adult education Resources: Employment, Secondary school teacher Screen Referred: Yes - CSW referred pt to financial counselor - Zachary [name] Money Management: Patient, Spouse Does the patient have any problems obtaining your medications?: Yes (Describe) (Pt reportedly did not take his medications for his high blood pressure for the past 9 months due to finances.  CSW to refer pt to [place] and [facility] program.) Home Management: Pt was doing outside chores at the family's home. Patient/Family Preliminary Plans: Pt plans to return to his home with his wife to be with him after work and her cousin to check on him as needed during the day. Sw Barriers to Discharge: Decreased caregiver support Social Work Anticipated Follow Up Needs: HH/OP, Support Group Expected length of stay: 10-14 days  Clinical Impression CSW met with pt to introduce self and role of CSW, as well as to complete assessment.  Pt admitted to not taking medications for about 9 months due to not being able to afford them and not taking care of himself, which he said caused him to end up here.  CSW explained the [facility] program and that CSW would assist with one month's supply of medications, but then also refer pt to the [place] and [facility].  Pt was appreciative.  Pt was about to have insurance at the [employer] where he works since he was about at the 3 month point.  He was frustrated by this.  He hopes to return to work at some point and was enjoying the work.  Pt's wife was out of town at time of [place] visit, but CSW will meet her/talk with her after team conference on Tuesday.  Pt is pleased to be on CIR and is just concerned what his recovery  course will be like and how long it will take to get better.  He was encouraged by the therapists telling him that he has good potential for recovery and that his stroke was not as bad as some patients'.  Pt has good family support and they motivate him to keep working hard to recover.  CSW to continue to follow for support, community resource connection, and d/c planning.  Antoine Fiallos, Silvestre Mesi 03/24/2017, 12:11 PM

## 2017-03-24 NOTE — Evaluation (Signed)
Physical Therapy Assessment and Plan  Patient Details  Name: Zachary [name] MRN: [id number] Date of Birth: 09/08/1970  PT Diagnosis: Abnormality of gait, Difficulty walking, Hemiparesis non-dominant, Impaired sensation and Muscle weakness Rehab Potential: Good ELOS: 18 to 21 days   Today's Date: 03/24/2017 PT Individual Time: 0921-0940 PT Individual Time Calculation (min): 19 min    Problem List:  Patient Active Problem List   Diagnosis Date Noted  . Hyperlipidemia LDL goal <70   . Hypokalemia   . Hyponatremia   . AKI (acute kidney injury) ([place])   . Diabetes mellitus type 2 in nonobese (HCC)   . Benign essential HTN   . Hemiparesis affecting left side as late effect of cerebrovascular accident (CVA) ([place]) 03/23/2017  . Gait disturbance, post-stroke 03/23/2017  . Right pontine cerebrovascular accident ([place]) 03/23/2017  . Left pontine stroke (HCC) - Right paramedian pontine infarct likely secondary to small vessel disease versus symptomatic terminal right vertebral artery stenosis. 03/18/2017    Past Medical History:  Past Medical History:  Diagnosis Date  . Hypertension    Past Surgical History: History reviewed. No pertinent surgical history.  Assessment & Plan Clinical Impression: Patient is a 46 y.o. year old male with recent admission to the hospital on 03-18-17 with history of obesity, hypertension. Patient has not taken his blood pressure medication in approximately 9 months because of financial reasons. Blood pressure 238/136and placed on cleviprexand labetalol. Per chart review patient lives with spouseand 2 children ages 72 and 34. Independent prior to admission and works at [employer] in addition to a second job. 2 level home with bedroom upstairs.Wife works during the day.He presented 03/18/2017 with severe dizziness, left-sided weakness and numbness. Cranial CT scan negative. Patient did receive TPA. MRI showed acute infarction right paramedian inferior pons  extending into the medulla. 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 UDS negative.CT angio head and neck negative for emergent large vessel occlusion. 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 EEG negative for seizure. Neurology consulted currently on aspirin and Plavix for CVA prophylaxis. Findings of elevated hemoglobin A1c 8.1 with diabetic coordinator consulted. Intermittent bouts of headaches with Topamax initiated. Tolerating a regular diet. Physical and occupational therapy evaluations completed with recommendations of physical medicine rehabilitation consult.Patient was admitted for a comprehensive rehabilitation program.  Patient transferred to CIR on 03/23/2017 .   Patient currently requires max with mobility secondary to muscle weakness, impaired timing and sequencing, abnormal tone, unbalanced muscle activation and decreased motor planning, decreased attention to left and decreased motor planning and decreased sitting balance, decreased standing balance, decreased postural control, hemiplegia and decreased balance strategies.  Prior to hospitalization, patient was independent  with mobility and lived with Spouse, Family (48 adn 36 y/o children) in a House home.  Home access is  Stairs to enter (2 stairs).  Patient will benefit from skilled PT intervention to maximize safe functional mobility, minimize fall risk and decrease caregiver burden for planned discharge home with 24 hour assist.  Anticipate patient will benefit from follow up [facility] at discharge.  PT - End of Session Activity Tolerance: Tolerates 30+ min activity with multiple rests Endurance Deficit: Yes PT Assessment Rehab Potential (ACUTE/IP ONLY): Good PT Barriers to Discharge: Inaccessible home environment;Decreased caregiver support;Home environment access/layout PT Barriers to  Discharge Comments:  (Bed/bath on second floor only; Intermittent assist available only) PT Patient demonstrates impairments in the following  area(s): Balance;Endurance;Motor;Perception;Safety;Sensory PT Transfers Functional Problem(s): Bed Mobility;Bed to Chair;Car;Furniture PT Locomotion Functional Problem(s): Ambulation;Wheelchair Mobility;Stairs PT Plan PT Intensity: Minimum of 1-2 x/day ,45 to 90 minutes PT Frequency: 5 out of 7 days PT Duration Estimated Length of Stay: 18 to 21 days PT Treatment/Interventions: Ambulation/gait training;Balance/vestibular training;Community reintegration;Functional electrical stimulation;DME/adaptive equipment instruction;Disease management/prevention;Discharge planning;Functional mobility training;Neuromuscular re-education;Pain management;Patient/family education;Stair training;Psychosocial support;Therapeutic Activities;Therapeutic Exercise;Splinting/orthotics;UE/LE Strength taining/ROM;UE/LE Coordination activities;Visual/perceptual remediation/compensation;Wheelchair propulsion/positioning PT Transfers Anticipated Outcome(s):  (Supervision) PT Locomotion Anticipated Outcome(s):  (Min assist) PT Recommendation Recommendations for Other Services: Therapeutic Recreation consult Therapeutic Recreation Interventions: Outing/community reintergration Follow Up Recommendations: Home health PT;Outpatient PT Patient destination: Home Equipment Recommended: To be determined  Skilled Therapeutic Intervention Session 1 [phone]:  This session focused on bed mobility, completion of initial evaluation and focused on educating pt regarding rehab.  BP at initiation of session: 183/106.  Nursing notified and gave meds.  BP 10 minutes later was: 183/111.  Full evaluation held until therapist able to clarify any BP concerns with PA-C.  Session 2: [id number]:  This session focused on out of bed mobility to decrease burden of care for expected D/C home.  Prior to session,  therapist spoke with [name], PA-C who reports that pt is able to participate with therapy regardless of how high BP is unless he becomes symptomatic.  During session, pt propelled w/c x 150 ft with min assist for steering, ambulated 10 ft with HHA and max A to place and stabilize L LE, and performed sit to stand tranfsers with mod to max assist with max A to stabilize L LE.  PT Evaluation Precautions/Restrictions Precautions Precautions: Fall Precaution Comments:  (BP runs elevated; Per [name], PA-C; No precautions for BP at this time. ) Restrictions Weight Bearing Restrictions: No General Chart Reviewed: Yes Family/Caregiver Present: No Vital Signs Pain - denied   Home Living/Prior Functioning Home Living Living Arrangements: Spouse/significant other;Children    Cognition Overall Cognitive Status: Within Functional Limits for tasks assessed Arousal/Alertness: Awake/alert Orientation Level: Oriented X4 Comments:  (6/6 on Mini-Cog) Sensation Sensation Light Touch: Appears Intact Proprioception: Impaired Detail Proprioception Impaired Details: Impaired LLE (Unable to feel L great toe be moved up or down) Coordination Gross Motor Movements are Fluid and Coordinated: No Fine Motor Movements are Fluid and Coordinated: No Coordination and Movement Description:  (Unable to integrate L UE/LE movement effectively for B Ue/LE w/c propulsion) Motor  Motor Motor: Hemiplegia Motor - Skilled Clinical Observations:  (No significant increased tone noted in L LE, however, pt reports increased tightness.  Pt demos poor kinesthetic awareness with L foot placement during gait and requires max A for L knee stability during gait.)     Trunk/Postural Assessment  Thoracic Assessment Thoracic Assessment: Exceptions to [facility] Thoracic AROM Overall Thoracic AROM Comments: Increased thoracic kyphosis Lumbar Assessment Lumbar Assessment: Exceptions to [facility] Lumbar AROM Overall Lumbar AROM Comments: Increased  posterior pelvic tilt  Balance Balance Balance Assessed: Yes Dynamic Sitting Balance Dynamic Sitting - Balance Support: Feet supported Sitting balance - Comments:  (Able to maintain static sitting balance while using B UE) Static Standing Balance Static Standing - Balance Support: During functional activity (Pt requires max assist for L LE placement and L knee stabilization) Extremity Assessment  RUE Assessment RUE Assessment: Within Functional Limits LUE Assessment LUE Assessment: Exceptions to [facility] LUE Strength LUE Overall Strength Comments: L elbow flexion/extension: 4-/5; L wrist extension: 5/5 RLE Assessment RLE Assessment: Within Functional Limits LLE Assessment LLE Assessment: Exceptions to [facility] LLE Strength LLE Overall Strength Comments: L ankle  DF: 4/5; Pt unable to stabilize L knee in standing witout max assist, however, pt is able to isolate L LE heel slide into hip flexion/knee flexion independently. LLE Tone LLE Tone: Mild   See Function Navigator for Current Functional Status.   Refer to Care Plan for Long Term Goals  Recommendations for other services: Therapeutic Recreation  Outing/community reintegration  Discharge Criteria: Patient will be discharged from PT if patient refuses treatment 3 consecutive times without medical reason, if treatment goals not met, if there is a change in medical status, if patient makes no progress towards goals or if patient is discharged from hospital.  The above assessment, treatment plan, treatment alternatives and goals were discussed and mutually agreed upon: by patient  Takeila Thayne Hilario Quarry 03/24/2017, 1:02 PM

## 2017-03-24 NOTE — IPOC Note (Signed)
Overall Plan of Care [facility]) Patient Details Name: Zachary [name] MRN: [id number] DOB: 1971/08/14  Admitting Diagnosis: R CVA  Hospital Problems: Active Problems:   Hemiparesis affecting left side as late effect of cerebrovascular accident (CVA) ([place])   Gait disturbance, post-stroke   Right pontine cerebrovascular accident ([place])   Hyperlipidemia LDL goal <70   Hypokalemia   Hyponatremia   AKI (acute kidney injury) ([place])   Diabetes mellitus type 2 in nonobese ([place])   Benign essential HTN     Functional Problem List: Nursing Bladder, Bowel, Endurance, Medication Management, Motor, Perception, Safety, Sensory  PT Balance, Endurance, Motor, Perception, Safety, Sensory  OT Balance, Safety, Endurance, Motor, Sensory  SLP    TR         Basic ADL's: OT Grooming, Bathing, Dressing, Toileting     Advanced  ADL's: OT Simple Meal Preparation     Transfers: PT Bed Mobility, Bed to Chair, Car, Manufacturing systems engineer, Metallurgist: PT Ambulation, Emergency planning/management officer, Stairs     Additional Impairments: OT Fuctional Use of Upper Extremity  SLP        TR      Anticipated Outcomes Item Anticipated Outcome  Self Feeding N/A  Swallowing      Basic self-care  Supervision-Mod I   Comptroller Transfers Supervision   Bowel/Bladder  mod I  Transfers   (Supervision)  Locomotion   (Min assist)  Communication     Cognition     Pain  less than 2  Safety/Judgment  mod I   Therapy Plan: PT Intensity: Minimum of 1-2 x/day ,45 to 90 minutes PT Frequency: 5 out of 7 days PT Duration Estimated Length of Stay: 18 to 21 days OT Intensity: Minimum of 1-2 x/day, 45 to 90 minutes OT Frequency: 5 out of 7 days OT Duration/Estimated Length of Stay: 17-19 days      Team Interventions: Nursing Interventions Patient/Family Education, Bladder Management, Bowel Management, Disease Management/Prevention, Medication Management, Cognitive  Remediation/Compensation, Psychosocial Support  PT interventions Ambulation/gait training, Training and development officer, Community reintegration, Functional electrical stimulation, DME/adaptive equipment instruction, Disease management/prevention, Discharge planning, Functional mobility training, Neuromuscular re-education, Pain management, Patient/family education, Stair training, Psychosocial support, Therapeutic Activities, Therapeutic Exercise, Splinting/orthotics, UE/LE Strength taining/ROM, UE/LE Coordination activities, Visual/perceptual remediation/compensation, Wheelchair propulsion/positioning  OT Interventions Training and development officer, Discharge planning, Functional electrical stimulation, Self Care/advanced ADL retraining, Therapeutic Activities, UE/LE Coordination activities, Cognitive remediation/compensation, Functional mobility training, Patient/family education, Therapeutic Exercise, DME/adaptive equipment instruction, Neuromuscular re-education, Psychosocial support, UE/LE Strength taining/ROM, Wheelchair propulsion/positioning  SLP Interventions    TR Interventions    SW/CM Interventions Discharge Planning, Psychosocial Support, Patient/Family Education   Barriers to Discharge MD  Medical stability  Nursing      PT Inaccessible home environment, Decreased caregiver support, Home environment access/layout  (Bed/bath on second floor only; Intermittent assist available only)  OT Decreased caregiver support    SLP      SW Decreased caregiver support     Team Discharge Planning: Destination: PT-Home ,OT- Home , SLP-  Projected Follow-up: PT-Home health PT, Outpatient PT, OT-  Home health OT, Outpatient OT, SLP-  Projected Equipment Needs: PT-To be determined, OT- To be determined, SLP-  Equipment Details: PT- , OT-  Patient/family involved in discharge planning: PT- Patient,  OT-Patient, SLP-   MD ELOS: 16-19 days. Medical Rehab Prognosis:  Good Assessment:  46 y.o.right  handed malewith reported history of obesity, hypertension, medication non-compliance. Blood pressure 238/136and placed on cleviprexand  labetalol. Independent prior to admission and works at [employer] in addition to a second job. He presented 03/18/2017 with severe dizziness, left-sided weakness and numbness. Cranial CT scan negative. Patient did receive TPA. MRI showed acute infarction right paramedian inferior pons extending into the medulla. 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 UDS negative.CT angio head and neck negative for emergent large vessel occlusion. 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 EEG negative for seizure. Neurology consulted currently on aspirin and Plavix for CVA prophylaxis. Findings of elevated hemoglobin A1c 8.1 with diabetic coordinator consulted. Intermittent bouts of headaches with Topamax initiated. Pt with resulting functional deficits with mobility, weakness, transfers, and ability to complete ADLs. Will set goals for Supervision with most task with PT/OT, min A for ambulation.   See Team Conference Notes for weekly updates to the plan of care

## 2017-03-24 NOTE — Progress Notes (Signed)
Patient information reviewed and entered into eRehab system by Ishmel Acevedo, RN, CRRN, PPS Coordinator.  Information including medical coding and functional independence measure will be reviewed and updated through discharge.    

## 2017-03-25 ENCOUNTER — Inpatient Hospital Stay (HOSPITAL_COMMUNITY): Payer: Self-pay | Admitting: Occupational Therapy

## 2017-03-25 ENCOUNTER — Inpatient Hospital Stay (HOSPITAL_COMMUNITY): Payer: Self-pay

## 2017-03-25 ENCOUNTER — Inpatient Hospital Stay (HOSPITAL_COMMUNITY): Payer: Medicaid Other | Admitting: Physical Therapy

## 2017-03-25 DIAGNOSIS — G479 Sleep disorder, unspecified: Secondary | ICD-10-CM

## 2017-03-25 DIAGNOSIS — I69998 Other sequelae following unspecified cerebrovascular disease: Secondary | ICD-10-CM

## 2017-03-25 DIAGNOSIS — I69959 Hemiplegia and hemiparesis following unspecified cerebrovascular disease affecting unspecified side: Secondary | ICD-10-CM

## 2017-03-25 LAB — GLUCOSE, CAPILLARY
GLUCOSE-CAPILLARY: 134 mg/dL — AB (ref 65–99)
Glucose-Capillary: 120 mg/dL — ABNORMAL HIGH (ref 65–99)
Glucose-Capillary: 120 mg/dL — ABNORMAL HIGH (ref 65–99)
Glucose-Capillary: 122 mg/dL — ABNORMAL HIGH (ref 65–99)

## 2017-03-25 MED ORDER — TRAZODONE HCL 50 MG PO TABS
50.0000 mg | ORAL_TABLET | Freq: Every evening | ORAL | Status: DC | PRN
  Administered 2017-03-26 – 2017-04-03 (×5): 50 mg via ORAL
  Filled 2017-03-25 (×6): qty 1

## 2017-03-25 NOTE — Progress Notes (Signed)
Occupational Therapy Session Note  Patient Details  Name: Zachary [name] MRN: [id number] Date of Birth: March 20, 1971  Today's Date: 03/25/2017 OT Individual Time: 2080-2233 OT Individual Time Calculation (min): 72 min and 1535-1603   Short Term Goals: Week 1:  OT Short Term Goal 1 (Week 1): Pt will complete shower transfer with Min A OT Short Term Goal 2 (Week 1): Pt will complete sit<stand for LB dressing with Min A OT Short Term Goal 3 (Week 1): Pt will complete 1/3 components of toileting tasks  Skilled Therapeutic Interventions/Progress Updates:    1:1. No c/o pain. Handoff from NT after toileting. Pt squat pivot transfer throughout session w/c<>TTB>EOB with min A and VC for safety awareness. Pt bathes seated on TTB at sit to stand level with min A for standing balance as pt steadies himself with LUE on grab bar and washes buttocks with RUE. Pt dresses at sit to stand level from w/c at sink iwht supervision and MIN A for standing balance while advancing pants past hips. Pt requires VC to incorporate LUE into dressing tasks. Pt grooms at sink seated with supervision. Pt propels w/c to from tx gym using hemi technique with increased time. OT gives pt handout with pictures and demonstrates how to complete theraputty HEP with demonstration cues. Pt able to complete exercises with supervision and instructional cues. Exited session with pt seated in bed, exit alarm on and with call light in reach all needs met.  Session 2: 1;1 No pain however reports L shoulder tightness. Pt propels w/c to/from all tx destinations using hemi technique with increased time and supervision for general conditioning. Pt completes 3 rounds of 12 horse shoe tosses retrieving horse shoes with LUE by reaching laterally, crossing midline, and above shoulder height with tactile cues to extend L knee with touching A for balance. Pt completes 1x10 towel glides on table in standing with supervision weight bearing/wiping with LUE in  flexion/ext, horizontal ab/addcuct, int/ext rotation, circles, V's and diagonals with instructional cues for technique. Exited session with pt seated in bed with call light in reach and all needs met.    Therapy Documentation Precautions:  Precautions Precautions: Fall Precaution Comments:  (Left hemiparesis) Restrictions Weight Bearing Restrictions: No  See Function Navigator for Current Functional Status.   Therapy/Group: Individual Therapy  Tonny Branch 03/25/2017, 10:13 AM

## 2017-03-25 NOTE — Progress Notes (Signed)
Physical Therapy Session Note  Patient Details  Name: DERELL BRUUN MRN: [id number] Date of Birth: May 07, 1971  Today's Date: 03/25/2017 PT Individual Time: 1856-3149 PT Individual Time Calculation (min): 40 min   Short Term Goals: Week 1:  PT Short Term Goal 1 (Week 1): Pt will perform bed to chair transfer with min assist. PT Short Term Goal 2 (Week 1): Pt will ambulate 5 ft wtih mod assist of 1 with LRAD. PT Short Term Goal 3 (Week 1): Pt will stand x 2 min without LOB with min assist while performing functional UE activity.  Skilled Therapeutic Interventions/Progress Updates:  Pt received in room & agreeable to tx. BP assessed throughout session, please see below.  Session focused on gait and NMR through functional mobility. Transported pt to gym & initiated gait training with rail in hallway x 30 ft with min assist; pt initially requires assistance for increased step width LLE but is able to correct with only verbal cuing as task progressed. Transitioned to gait with RW x 37 ft + 37 ft with min/mod assist with continued cuing for step width LLE. Pt able to grasp RW with LUE therefore no need for hand orthosis. Pt also requires max cuing for sequencing when completing turns to transfer to sitting on surface. Pt propelled w/c gym>room with BLE with task focusing on LLE NMR. At end of session pt left sitting in w/c in room with all needs within reach.  Throughout session therapist provided instructional cuing for hand placement to increase safety with sit<>stand transfers.    Therapy Documentation Precautions:  Precautions Precautions: Fall Precaution Comments:  (Left hemiparesis) Restrictions Weight Bearing Restrictions: No    Vital Signs: At rest at beginning of session:  Therapy Vitals Pulse Rate: (!) 111 BP: (!) 164/102 Patient Position (if appropriate): Sitting   After ambulating 30 ft with rail:  BP = 162/101 mmHg (RUE, dinamap, sitting) HR = 107 bpm  After ambulating  37 ft x 2:  BP = 164/102 mmHg (RUE, dinamap, sitting) HR = 111 bpm  At end of session pt reported LLE pain & RN made aware.   See Function Navigator for Current Functional Status.   Therapy/Group: Individual Therapy  [name] 03/25/2017, 3:12 PM

## 2017-03-25 NOTE — Progress Notes (Signed)
Occupational Therapy Session Note  Patient Details  Name: Zachary [name] MRN: [id number] Date of Birth: July 16, 1971  Today's Date: 03/25/2017 OT Individual Time: 3299-2426 OT Individual Time Calculation (min): 44 min    Short Term Goals: Week 1:  OT Short Term Goal 1 (Week 1): Pt will complete shower transfer with Min A OT Short Term Goal 2 (Week 1): Pt will complete sit<stand for LB dressing with Min A OT Short Term Goal 3 (Week 1): Pt will complete 1/3 components of toileting tasks  Skilled Therapeutic Interventions/Progress Updates:    Tx focus on functional transfers, Lt NMR, and Lt attention.   Pt greeted in w/c, ready to go.  Practiced squat pivot and stand pivot transfers to drop arm commode placed over toilet. Pt initially requiring max instruction for safe DME setup and proper UE/LE placement prior to transfer, fading to min cuing (he tended to forget locking w/c brakes on Lt side). Utilized teach back method for ascertaining pts understanding/carryover during blocked practice. Pt initially requiring Mod A for toilet transfers, fading to Min A, pt exhibiting increased mindfulness with controlling L LE movements (min buckling exhibited). Rest breaks provided throughout due to fatigue. At end of tx pt left in w/c with all needs within reach.   Therapy Documentation Precautions:  Precautions Precautions: Fall Precaution Comments:  (Left hemiparesis) Restrictions Weight Bearing Restrictions: No  Vital Signs: Therapy Vitals Pulse Rate: (!) 111 BP: (!) 164/102 Patient Position (if appropriate): Sitting Pain: Pain Assessment Pain Assessment: 0-10 Pain Score: 5  Pain Type: Acute pain Pain Location: Shoulder Pain Orientation: Left Pain Descriptors / Indicators: Aching Pain Frequency: Intermittent Pain Onset: On-going Pain Intervention(s): Medication (See eMAR) ADL: ADL ADL Comments: Please see functional navigator for ADL status     See Function Navigator for Current  Functional Status.   Therapy/Group: Individual Therapy  Kiyonna Tortorelli A Luria Rosario 03/25/2017, 3:38 PM

## 2017-03-25 NOTE — Progress Notes (Addendum)
[place] PHYSICAL MEDICINE & REHABILITATION     PROGRESS NOTE  Subjective/Complaints:   patient seen lying in bed this morning. He states he slept well overnight. He states he had a good first in therapies yesterday. Per nursing, patient requested medication for sleep disturbance overnight.   ROS: Denies CP, SOB, N/V/D.  Objective: Vital Signs: Blood pressure (!) 160/98, pulse 90, temperature 98.4 F (36.9 C), temperature source Oral, resp. rate 18, SpO2 99 %. No results found.  Recent Labs  03/24/17 0457  WBC 10.3  HGB 15.5  HCT 44.4  PLT 369    Recent Labs  03/24/17 0457  NA 133*  K 3.2*  CL 97*  GLUCOSE 130*  BUN 26*  CREATININE 1.37*  CALCIUM 9.6   CBG (last 3)   Recent Labs  03/24/17 1749 03/24/17 2100 03/25/17 0651  GLUCAP 142* 119* 134*    Wt Readings from Last 3 Encounters:  03/18/17 102.1 kg (225 lb)    Physical Exam:  BP (!) 160/98 (BP Location: Left Arm)   Pulse 90   Temp 98.4 F (36.9 C) (Oral)   Resp 18   SpO2 99%  Constitutional: NAD. He appears well-developed and well-nourished.  HENT: Normocephalic. Atraumatic.  Eyes: EOMare normal. No discharge.  Cardiovascular: RRR. No JVD. Respiratory: Effort normal and breath sounds normal.  GI: Bowel sounds are normal. He exhibits no distension.  Neurological. Alert and oriented Mild facial droop Motor: RUE/RLE: 5/5 proximal to distal LUE/LLE: 4/5 proximal to distal Skin. Warm and dry. Intact.  Psych: Normal mood and behavior.   Assessment/Plan: 1. Functional deficits secondary to right pontine small vessel infarct which require 3+ hours per day of interdisciplinary therapy in a comprehensive inpatient rehab setting. Physiatrist is providing close team supervision and 24 hour management of active medical problems listed below. Physiatrist and rehab team continue to assess barriers to discharge/monitor patient progress toward functional and medical goals.  Function:  Bathing Bathing  position   Position: Shower  Bathing parts Body parts bathed by patient: Right arm, Left arm, Chest, Abdomen, Front perineal area, Right upper leg, Left upper leg, Right lower leg, Left lower leg Body parts bathed by helper: Buttocks, Back  Bathing assist Assist Level: Touching or steadying assistance(Pt > 75%)      Upper Body Dressing/Undressing Upper body dressing   What is the patient wearing?: Pull over shirt/dress     Pull over shirt/dress - Perfomed by patient: Thread/unthread right sleeve, Thread/unthread left sleeve, Put head through opening, Pull shirt over trunk          Upper body assist Assist Level: Supervision or verbal cues      Lower Body Dressing/Undressing Lower body dressing   What is the patient wearing?: Underwear, Pants, Socks, Shoes Underwear - Performed by patient: Thread/unthread right underwear leg, Thread/unthread left underwear leg, Pull underwear up/down   Pants- Performed by patient: Thread/unthread right pants leg, Thread/unthread left pants leg, Pull pants up/down       Socks - Performed by patient: Don/doff right sock, Don/doff left sock   Shoes - Performed by patient: Don/doff right shoe, Don/doff left shoe            Lower body assist Assist for lower body dressing: Touching or steadying assistance (Pt > 75%)      Toileting Toileting Toileting activity did not occur: No continent bowel/bladder event        Toileting assist     Transfers Chair/bed transfer   Chair/bed transfer method: Stand  pivot Chair/bed transfer assist level: Moderate assist (Pt 50 - 74%/lift or lower) Chair/bed transfer assistive device: Armrests     Locomotion Ambulation     Max distance:  (10 ft) Assist level: 2 helpers   Wheelchair   Type: Manual Max wheelchair distance: 150 ft Assist Level: Touching or steadying assistance (Pt > 75%)  Cognition Comprehension Comprehension assist level: Follows complex conversation/direction with extra  time/assistive device  Expression Expression assist level: Expresses complex ideas: With extra time/assistive device  Social Interaction Social Interaction assist level: Interacts appropriately with others with medication or extra time (anti-anxiety, antidepressant).  Problem Solving Problem solving assist level: Solves complex 90% of the time/cues < 10% of the time  Memory Memory assist level: Recognizes or recalls 90% of the time/requires cueing < 10% of the time    Medical Problem List and Plan: 1. Left hemiparesissecondary to right pontine small vessel infarct on 8/4  Cont CIR 2. DVT Prophylaxis/Anticoagulation: SCDs. Monitor for any signs of DVT 3. Pain Management/headaches: Topamax 50 mg twice a day 4. Mood: Provide emotional support 5. Neuropsych: This patient iscapable of making decisions on hisown behalf. 6. Skin/Wound Care: Routine skin checks 7. Fluids/Electrolytes/Nutrition: Routine I&Os 8.Hypertension.   Currently maintained on HCTZ 25 mg daily, Norvasc 10 mg daily.   Cont to monitor, will consider increase in meds tomorrow 9. Diabetes mellitus. Hemoglobin A1c 8.1. Glucophage 500 mg twice a day. Check blood sugars before meals and at bedtime. Diabetic teaching  Relatively controlled on 8/11 10. Hyperlipidemia. Lipitor 11.Obesity. BMI 32.28 kg/m2 12. Hyponatremia  Na+ 133 on 8/10  Cont to monitor 13. AKI  Cr 1.37 on 8/10  Encourage fluids  Cont to monitor 14. Hypokalemia  K+ 3.2 on 8/10  Supplementing x1 day  Cont to monitor 15. Sleep disturbance  Trazodone 50 mg prn  LOS (Days) 2 A FACE TO FACE EVALUATION WAS PERFORMED  Nazaret Chea Lorie Phenix 03/25/2017 8:25 AM

## 2017-03-26 ENCOUNTER — Inpatient Hospital Stay (HOSPITAL_COMMUNITY): Payer: Self-pay | Admitting: Occupational Therapy

## 2017-03-26 LAB — GLUCOSE, CAPILLARY
GLUCOSE-CAPILLARY: 153 mg/dL — AB (ref 65–99)
Glucose-Capillary: 137 mg/dL — ABNORMAL HIGH (ref 65–99)
Glucose-Capillary: 116 mg/dL — ABNORMAL HIGH (ref 65–99)
Glucose-Capillary: 125 mg/dL — ABNORMAL HIGH (ref 65–99)

## 2017-03-26 NOTE — Progress Notes (Signed)
[place] PHYSICAL MEDICINE & REHABILITATION     PROGRESS NOTE  Subjective/Complaints:  Patient seen lying in bed this morning. He states he slept well overnight. He denies complaints this morning.  ROS: Denies CP, SOB, N/V/D.  Objective: Vital Signs: Blood pressure (!) 157/105, pulse 99, temperature 98.6 F (37 C), temperature source Oral, resp. rate 18, SpO2 98 %. No results found.  Recent Labs  03/24/17 0457  WBC 10.3  HGB 15.5  HCT 44.4  PLT 369    Recent Labs  03/24/17 0457  NA 133*  K 3.2*  CL 97*  GLUCOSE 130*  BUN 26*  CREATININE 1.37*  CALCIUM 9.6   CBG (last 3)   Recent Labs  03/25/17 1616 03/25/17 2111 03/26/17 0632  GLUCAP 120* 122* 137*    Wt Readings from Last 3 Encounters:  03/18/17 102.1 kg (225 lb)    Physical Exam:  BP (!) 157/105 (BP Location: Left Arm)   Pulse 99   Temp 98.6 F (37 C) (Oral)   Resp 18   SpO2 98%  Constitutional: NAD. He appears well-developed and well-nourished.  HENT: Normocephalic. Atraumatic.  Eyes: EOMare normal. No discharge.  Cardiovascular: RRR. No JVD. Respiratory: Effort normal and breath sounds normal.  GI: Bowel sounds are normal. He exhibits no distension.  Neurological. Alert and oriented Mild facial droop Motor: RUE/RLE: 5/5 proximal to distal LUE/LLE: 4/5 proximal to distal (stable) Skin. Warm and dry. Intact.  Psych: Normal mood and behavior.   Assessment/Plan: 1. Functional deficits secondary to right pontine small vessel infarct which require 3+ hours per day of interdisciplinary therapy in a comprehensive inpatient rehab setting. Physiatrist is providing close team supervision and 24 hour management of active medical problems listed below. Physiatrist and rehab team continue to assess barriers to discharge/monitor patient progress toward functional and medical goals.  Function:  Bathing Bathing position   Position: Shower  Bathing parts Body parts bathed by patient: Right arm, Left  arm, Chest, Abdomen, Front perineal area, Right upper leg, Left upper leg, Right lower leg, Left lower leg, Buttocks Body parts bathed by helper: Back  Bathing assist Assist Level: Touching or steadying assistance(Pt > 75%)      Upper Body Dressing/Undressing Upper body dressing   What is the patient wearing?: Pull over shirt/dress     Pull over shirt/dress - Perfomed by patient: Thread/unthread right sleeve, Thread/unthread left sleeve, Put head through opening, Pull shirt over trunk          Upper body assist Assist Level: Supervision or verbal cues      Lower Body Dressing/Undressing Lower body dressing   What is the patient wearing?: Underwear, Pants, Socks, Shoes Underwear - Performed by patient: Thread/unthread right underwear leg, Thread/unthread left underwear leg, Pull underwear up/down   Pants- Performed by patient: Thread/unthread right pants leg, Thread/unthread left pants leg, Pull pants up/down       Socks - Performed by patient: Don/doff right sock, Don/doff left sock   Shoes - Performed by patient: Don/doff right shoe, Don/doff left shoe            Lower body assist Assist for lower body dressing: Touching or steadying assistance (Pt > 75%)      Toileting Toileting Toileting activity did not occur: No continent bowel/bladder event        Toileting assist     Transfers Chair/bed transfer   Chair/bed transfer method: Stand pivot Chair/bed transfer assist level: Moderate assist (Pt 50 - 74%/lift or lower) Chair/bed transfer  assistive device: Armrests     Locomotion Ambulation     Max distance: 37 ft Assist level: Touching or steadying assistance (Pt > 75%)   Wheelchair   Type: Manual Max wheelchair distance: 150 ft (BLE) Assist Level: Supervision or verbal cues  Cognition Comprehension Comprehension assist level: Follows complex conversation/direction with no assist  Expression Expression assist level: Expresses complex ideas: With no  assist  Social Interaction Social Interaction assist level: Interacts appropriately with others - No medications needed.  Problem Solving Problem solving assist level: Solves complex problems: Recognizes & self-corrects  Memory Memory assist level: Complete Independence: No helper    Medical Problem List and Plan: 1. Left hemiparesissecondary to right pontine small vessel infarct on 8/4  Cont CIR 2. DVT Prophylaxis/Anticoagulation: SCDs. Monitor for any signs of DVT 3. Pain Management/headaches: Topamax 50 mg twice a day 4. Mood: Provide emotional support 5. Neuropsych: This patient iscapable of making decisions on hisown behalf. 6. Skin/Wound Care: Routine skin checks 7. Fluids/Electrolytes/Nutrition: Routine I&Os 8.Hypertension.   Currently maintained on HCTZ 25 mg daily, Norvasc 10 mg daily.   Cont to monitor, will consider Medications tomorrow after results of lab work  9. Diabetes mellitus. Hemoglobin A1c 8.1. Glucophage 500 mg twice a day. Check blood sugars before meals and at bedtime. Diabetic teaching  Relatively controlled on 8/12 10. Hyperlipidemia. Lipitor 11.Obesity. BMI 32.28 kg/m2 12. Hyponatremia  Na+ 133 on 8/10  Labs ordered for tomorrow  Cont to monitor 13. AKI  Cr 1.37 on 8/10  Labs ordered for tomorrow  Encourage fluids  Cont to monitor 14. Hypokalemia  K+ 3.2 on 8/10  Labs ordered for tomorrow  Supplementing x1 day  Cont to monitor 15. Sleep disturbance  Trazodone 50 mg prn  Improving  LOS (Days) 3 A FACE TO FACE EVALUATION WAS PERFORMED  Jamielyn Petrucci Lorie Phenix 03/26/2017 7:48 AM

## 2017-03-26 NOTE — Progress Notes (Signed)
Occupational Therapy Session Note  Patient Details  Name: Zachary [name] MRN: [id number] Date of Birth: 12-22-1970  Today's Date: 03/26/2017 OT Individual Time: 7322-0254 OT Individual Time Calculation (min): 60 min   Short Term Goals: Week 1:  OT Short Term Goal 1 (Week 1): Pt will complete shower transfer with Min A OT Short Term Goal 2 (Week 1): Pt will complete sit<stand for LB dressing with Min A OT Short Term Goal 3 (Week 1): Pt will complete 1/3 components of toileting tasks  Skilled Therapeutic Interventions/Progress Updates:    Tx focus on Lt NMR, functional transfers, and endurance for transfer of learning during ADL tasks.   Pt greeted in recliner, agreeable to tx. He completed stand step transfer to w/c with Min A, then self propelled to dayroom utilizing hemi technique. Pt engaging in graded [facility] activity with use of paper money and coins. Pt retrieving specific dollar amounts with L UE while holding >10 bills in Rt hand. Worked extensively on in-hand manipulation, pt exhibiting increased difficultly with palm to finger translations. Practiced with penny, then downgraded task to half-dollar. He was very motivated to meet activity demands, reports he wants as much function back in his L UE as possible. "I'm not giving up." Utilized modeling and bilateral practice with improved smoothness of movement. Additional FM practice with combined hand pronation/supination with card flipping task. Asked him to have family bring in card deck for continued NMR in room. Provided him with additional HEP in room with foam blocks to work on skills practiced today. Pt self propelled back to room and completed transfer back to recliner in manner as written above. Pt left with all needs within reach.   Precautions:  Precautions Precautions: Fall Precaution Comments:  (Left hemiparesis) Restrictions Weight Bearing Restrictions: No Vital Signs: Therapy Vitals Temp: 98.5 F (36.9 C) Temp Source:  Oral Pulse Rate: (!) 101 Resp: 19 BP: (!) 163/108 Patient Position (if appropriate): Sitting Oxygen Therapy SpO2: 98 % O2 Device: Not Delivered Pain:   ADL: ADL ADL Comments: Please see functional navigator for ADL status V:    See Function Navigator for Current Functional Status.   Therapy/Group: Individual Therapy  Khyre Germond A Kymani Laursen 03/26/2017, 4:35 PM

## 2017-03-27 ENCOUNTER — Inpatient Hospital Stay (HOSPITAL_COMMUNITY): Payer: Medicaid Other | Admitting: Occupational Therapy

## 2017-03-27 ENCOUNTER — Inpatient Hospital Stay (HOSPITAL_COMMUNITY): Payer: Self-pay | Admitting: Occupational Therapy

## 2017-03-27 ENCOUNTER — Inpatient Hospital Stay (HOSPITAL_COMMUNITY): Payer: Medicaid Other | Admitting: Physical Therapy

## 2017-03-27 LAB — GLUCOSE, CAPILLARY
GLUCOSE-CAPILLARY: 121 mg/dL — AB (ref 65–99)
Glucose-Capillary: 120 mg/dL — ABNORMAL HIGH (ref 65–99)
Glucose-Capillary: 118 mg/dL — ABNORMAL HIGH (ref 65–99)
Glucose-Capillary: 130 mg/dL — ABNORMAL HIGH (ref 65–99)

## 2017-03-27 LAB — BASIC METABOLIC PANEL
ANION GAP: 14 (ref 5–15)
BUN: 23 mg/dL — ABNORMAL HIGH (ref 6–20)
CALCIUM: 9.4 mg/dL (ref 8.9–10.3)
CO2: 25 mmol/L (ref 22–32)
CREATININE: 1.38 mg/dL — AB (ref 0.61–1.24)
Chloride: 96 mmol/L — ABNORMAL LOW (ref 101–111)
GFR calc Af Amer: >60 mL/min (ref >60)
GFR, EST NON AFRICAN AMERICAN: 59 mL/min — AB (ref >60)
GLUCOSE: 113 mg/dL — AB (ref 65–99)
Potassium: 2.8 mmol/L — ABNORMAL LOW (ref 3.5–5.1)
Sodium: 135 mmol/L (ref 135–145)

## 2017-03-27 LAB — CBC WITH DIFFERENTIAL/PLATELET
BASOS ABS: 0 10*3/uL (ref 0.0–0.1)
BASOS PCT: 0 %
EOS PCT: 1 %
Eosinophils Absolute: 0.1 10*3/uL (ref 0.0–0.7)
HEMATOCRIT: 43.7 % (ref 39.0–52.0)
Hemoglobin: 14.9 g/dL (ref 13.0–17.0)
LYMPHS PCT: 44 %
Lymphs Abs: 3.1 10*3/uL (ref 0.7–4.0)
MCH: 27.4 pg (ref 26.0–34.0)
MCHC: 34.1 g/dL (ref 30.0–36.0)
MCV: 80.5 fL (ref 78.0–100.0)
MONO ABS: 0.6 10*3/uL (ref 0.1–1.0)
MONOS PCT: 8 %
Neutro Abs: 3.3 10*3/uL (ref 1.7–7.7)
Neutrophils Relative %: 47 %
PLATELETS: 403 10*3/uL — AB (ref 150–400)
RBC: 5.43 MIL/uL (ref 4.22–5.81)
RDW: 13.1 % (ref 11.5–15.5)
WBC: 7 10*3/uL (ref 4.0–10.5)

## 2017-03-27 MED ORDER — POTASSIUM CHLORIDE CRYS ER 20 MEQ PO TBCR
40.0000 meq | EXTENDED_RELEASE_TABLET | Freq: Two times a day (BID) | ORAL | Status: DC
  Administered 2017-03-27 – 2017-03-28 (×3): 40 meq via ORAL
  Filled 2017-03-27 (×3): qty 2

## 2017-03-27 NOTE — Progress Notes (Signed)
Occupational Therapy Session Note  Patient Details  Name: Zachary [name] MRN: [id number] Date of Birth: 08-Oct-1970  Today's Date: 03/27/2017 OT Individual Time: 1045-1200 OT Individual Time Calculation (min): 75 min    Short Term Goals: Week 1:  OT Short Term Goal 1 (Week 1): Pt will complete shower transfer with Min A OT Short Term Goal 2 (Week 1): Pt will complete sit<stand for LB dressing with Min A OT Short Term Goal 3 (Week 1): Pt will complete 1/3 components of toileting tasks  Skilled Therapeutic Interventions/Progress Updates:    Skilled OT intervention with focus on L NMR, self care task, functional transfers, mobility, and [place]. Pt ambulated from wheelchair >bathroom 10' with min A for balance. Pt very fatigued once reaching TTB. Pt bathing at shower level while seated with 1 sit <>stand with min A and use of grab bar. Pt needing steady assistance for balance while standing to wash buttocks and peri area. Pt utilized L UE to wash as many parts as possible with only one cloth drop. Pt donning pants from TTB with assistance to pull over buttocks and steady assistance for balance. Pt transferred to wheelchair with min A squat pivot. Pt completing UB dressing with set up A. Pt crossing each ankle over opposite knee to don socks and B shoes. Pt unable to fully tie shoes without assistance from therapist. Pt propelled wheelchair with B LE's to day room with supervision. Pt engaged in palmar translation task with much difficulty and drops from L hand. Pt demonstrating finger isolation exercises with increased performance with each try. Pt returned to room with lunch placed in front of him. Call bell and all needed items within reach.   Therapy Documentation Precautions:  Precautions Precautions: Fall Precaution Comments:  (Left hemiparesis) Restrictions Weight Bearing Restrictions: No General:   Vital Signs: Therapy Vitals Pulse Rate: (!) 114 BP: (!) 152/100 Patient Position (if  appropriate): Sitting (after ambulating with PT) Oxygen Therapy SpO2: 98 % O2 Device: Not Delivered Pain:   ADL: ADL ADL Comments: Please see functional navigator for ADL status Vision   Perception    Praxis   Exercises:   Other Treatments:    See Function Navigator for Current Functional Status.   Therapy/Group: Individual Therapy  Gypsy Decant 03/27/2017, 12:12 PM

## 2017-03-27 NOTE — Progress Notes (Signed)
Physical Therapy Session Note  Patient Details  Name: Zachary [name] MRN: [id number] Date of Birth: 06/25/1971  Today's Date: 03/27/2017 PT Individual Time: 0902-1001 PT Individual Time Calculation (min): 59 min   Short Term Goals: Week 1:  PT Short Term Goal 1 (Week 1): Pt will perform bed to chair transfer with min assist. PT Short Term Goal 2 (Week 1): Pt will ambulate 5 ft wtih mod assist of 1 with LRAD. PT Short Term Goal 3 (Week 1): Pt will stand x 2 min without LOB with min assist while performing functional UE activity.  Skilled Therapeutic Interventions/Progress Updates:    no c/o pain. PT treatment session focused on L LE NMR during w/c mobility, pre-gait, and gait. Pt supine in bed upon arrival, agreeable to PT. Supine to sit HOB elevated, using bed rails with supervision for safety. Squat pivot bed to w/c to mat with min assist for steadying. Pt education on proper use of w/c breaks and armrests for transfer set-up. W/c mobility 150f using B LEs with supervision and verbal cues for equal use of LEs. Sit to stands with and without AD throughout session with steadying assist. Ambulated [id number] RW, sitting rest break between, and min assist for balance with pt demonstrating carry over from prior PT sessions to abduct L LE during swing phase to prevent scissoring. Pre-gait with R LE step forward/backward without AD with mod assist progressing to min A. PT provided verbal and tactile cues for L weight shift and L LE knee extension for increased weight bearing. Ambulated [id number], seated breaks between, without AD and min assist for balance with verbal cues to abduct L LE for carryover and PT providing tactile cues for L weight shift during stance phase. Squat pivot mat to w/c with min assist for steadying. W/c mobility [id number] using B LEs with supervision. Pt left sitting in w/c with call bell in reach and needs met.    Therapy Documentation Precautions:   Precautions Precautions: Fall Precaution Comments:  (Left hemiparesis) Restrictions Weight Bearing Restrictions: No Vital Signs: Therapy Vitals Pulse Rate: 108 BP: 156/96 Patient position: sitting SpO2: 99%  Pulse Rate: (!) 114 BP: (!) 152/100 Patient Position (if appropriate): Sitting (after ambulating with PT) Oxygen Therapy SpO2: 98 % O2 Device: Not Delivered   Pulse rate: 111 BP: 153/92 Patient position: sitting (at end of session) SpO2: 98%   See Function Navigator for Current Functional Status.   Therapy/Group: Individual Therapy  Lakima Dona 03/27/2017, 12:18 PM

## 2017-03-27 NOTE — Plan of Care (Signed)
Problem: RH Car Transfers Goal: LTG Patient will perform car transfers with assist (PT) LTG: Patient will perform car transfers with assistance (PT).  With LRAD; upgrade 2/2 progress  Problem: RH Furniture Transfers Goal: LTG Patient will perform furniture transfers w/assist (OT/PT LTG: Patient will perform furniture transfers  with assistance (OT/PT).  Upgrade 2/2 progress  Problem: RH Ambulation Goal: LTG Patient will ambulate in controlled environment (PT) LTG: Patient will ambulate in a controlled environment, # of feet with assistance (PT).  150 ft with LRAD; upgrade 2/2 progress Goal: LTG Patient will ambulate in home environment (PT) LTG: Patient will ambulate in home environment, # of feet with assistance (PT).  46 ft with LRAD; upgrade 2/2 progress  Problem: RH Wheelchair Mobility Goal: LTG Patient will propel w/c in home environment (PT) LTG: Patient will propel wheelchair in home environment, # of feet with assistance (PT).  Outcome: Not Applicable Date Met: 35/59/74 D/c goal - anticipate pt to be ambulatory upon d/c  Problem: RH Stairs Goal: LTG Patient will ambulate up and down stairs w/assist (PT) LTG: Patient will ambulate up and down # of stairs with assistance (PT)  12 steps with 1 rail for access to 2nd level of home

## 2017-03-27 NOTE — Progress Notes (Signed)
Occupational Therapy Session Note  Patient Details  Name: Zachary [name] MRN: [id number] Date of Birth: [birth year]  Today's Date: 03/27/2017 OT Individual Time: 1302-1402 OT Individual Time Calculation (min): 60 min    Short Term Goals: Week 1:  OT Short Term Goal 1 (Week 1): Pt will complete shower transfer with Min A OT Short Term Goal 2 (Week 1): Pt will complete sit<stand for LB dressing with Min A OT Short Term Goal 3 (Week 1): Pt will complete 1/3 components of toileting tasks     Skilled Therapeutic Interventions/Progress Updates:    Tx focus on balance, Lt NMR, endurance, and functional transfers.   Pt greeted in w/c, agreeable to tx. Pt self propelled to dayroom with hemi technique. Educated him on w/c parts mgt with hands on practice. He participated in [facility] activity with use of geometric shapes while standing at elevated table. He used pincer grasp to retrieve small pieces, extra time for manipulating items in hand and for exhibiting precise finger movements while creating certain structures with shape combinations. Min cues for weightshifting to Lt side and extending Lt knee. Pt engaging in 2 additional [facility] activities while seated. Pt able to fasten/unfasten 5 large buttons with extra time, unable to independently tie shoelace knot due to decreased Lt sensation/coordination. Had him discriminate items in bean box for stereognosis/sensation normalization. Activity downgraded to placing item in pts hand and having him assess what it was. Pt requiring Rt hand transfer to successfully identify 5/5 items, however, he was able to ascertain gross features of items placed in Lt hand. At end of tx, pt self propelled back to room. Pt completed stand step transfer back to recliner with Min A and was left with all needs within reach.     Therapy Documentation Precautions:  Precautions Precautions: Fall Precaution Comments:  (Left hemiparesis) Restrictions Weight Bearing Restrictions:  No Pain: No c/o pain during tx    ADL: ADL ADL Comments: Please see functional navigator for ADL status     See Function Navigator for Current Functional Status.   Therapy/Group: Individual Therapy  Deondrae Mcgrail A Khristian Phillippi 03/27/2017, 2:03 PM

## 2017-03-27 NOTE — Progress Notes (Signed)
[place] PHYSICAL MEDICINE & REHABILITATION     PROGRESS NOTE  Subjective/Complaints:   no new issues. Had a quiet weekend. Denies pain. Right side still feels "tingly"  ROS: pt denies nausea, vomiting, diarrhea, cough, shortness of breath or chest pain   Objective: Vital Signs: Blood pressure (!) 144/95, pulse 91, temperature 97.7 F (36.5 C), temperature source Oral, resp. rate 18, SpO2 97 %. No results found.  Recent Labs  03/27/17 0455  WBC 7.0  HGB 14.9  HCT 43.7  PLT 403*    Recent Labs  03/27/17 0455  NA 135  K 2.8*  CL 96*  GLUCOSE 113*  BUN 23*  CREATININE 1.38*  CALCIUM 9.4   CBG (last 3)   Recent Labs  03/26/17 1642 03/26/17 2038 03/27/17 0641  GLUCAP 153* 125* 120*    Wt Readings from Last 3 Encounters:  03/18/17 102.1 kg (225 lb)    Physical Exam:  BP (!) 144/95 (BP Location: Left Arm)   Pulse 91   Temp 97.7 F (36.5 C) (Oral)   Resp 18   SpO2 97%  Constitutional: NAD. He appears well-developed and well-nourished.  HENT: Normocephalic. Atraumatic.  Eyes: EOMare normal. No discharge.  Cardiovascular: RRR without murmur. No JVD . Respiratory: CTA Bilaterally without wheezes or rales. Normal effort .  GI: Bowel sounds are normal. He exhibits no distension.  Neurological. Alert and oriented Mild facial droop Motor: RUE/RLE: 5/5 proximal to distal LUE: 4- to 4/5 proximal to distal. LLE: 4/5 Sensation 1+/2 Left arm and leg. Decreased FMC Skin. Warm and dry. Intact.  Psych: Normal mood and behavior.   Assessment/Plan: 1. Functional deficits secondary to right pontine small vessel infarct which require 3+ hours per day of interdisciplinary therapy in a comprehensive inpatient rehab setting. Physiatrist is providing close team supervision and 24 hour management of active medical problems listed below. Physiatrist and rehab team continue to assess barriers to discharge/monitor patient progress toward functional and medical  goals.  Function:  Bathing Bathing position   Position: Shower  Bathing parts Body parts bathed by patient: Right arm, Left arm, Chest, Abdomen, Front perineal area, Right upper leg, Left upper leg, Right lower leg, Left lower leg, Buttocks Body parts bathed by helper: Back  Bathing assist Assist Level: Touching or steadying assistance(Pt > 75%)      Upper Body Dressing/Undressing Upper body dressing   What is the patient wearing?: Pull over shirt/dress     Pull over shirt/dress - Perfomed by patient: Thread/unthread right sleeve, Thread/unthread left sleeve, Put head through opening, Pull shirt over trunk          Upper body assist Assist Level: Supervision or verbal cues      Lower Body Dressing/Undressing Lower body dressing   What is the patient wearing?: Underwear, Pants, Socks, Shoes Underwear - Performed by patient: Thread/unthread right underwear leg, Thread/unthread left underwear leg, Pull underwear up/down   Pants- Performed by patient: Thread/unthread right pants leg, Thread/unthread left pants leg, Pull pants up/down       Socks - Performed by patient: Don/doff right sock, Don/doff left sock   Shoes - Performed by patient: Don/doff right shoe, Don/doff left shoe            Lower body assist Assist for lower body dressing: Touching or steadying assistance (Pt > 75%)      Toileting Toileting Toileting activity did not occur: No continent bowel/bladder event Toileting steps completed by patient: Performs perineal hygiene Toileting steps completed by helper: Adjust  clothing prior to toileting, Adjust clothing after toileting [place]: Grab bar or rail  Toileting assist Assist level: Touching or steadying assistance (Pt.75%)   Transfers Chair/bed transfer   Chair/bed transfer method: Stand pivot Chair/bed transfer assist level: Touching or steadying assistance (Pt > 75%) Chair/bed transfer assistive device: Armrests      Locomotion Ambulation     Max distance: 37 ft Assist level: Touching or steadying assistance (Pt > 75%)   Wheelchair   Type: Manual Max wheelchair distance: 150 ft (BLE) Assist Level: Supervision or verbal cues  Cognition Comprehension Comprehension assist level: Follows complex conversation/direction with no assist  Expression Expression assist level: Expresses complex ideas: With no assist  Social Interaction Social Interaction assist level: Interacts appropriately with others - No medications needed.  Problem Solving Problem solving assist level: Solves complex problems: Recognizes & self-corrects  Memory Memory assist level: Complete Independence: No helper    Medical Problem List and Plan: 1. Left hemiparesissecondary to right pontine small vessel infarct on 8/4  Cont CIR 2. DVT Prophylaxis/Anticoagulation: SCDs. Monitor for any signs of DVT 3. Pain Management/headaches: Topamax 50 mg twice a day--controlled 4. Mood: Provide emotional support 5. Neuropsych: This patient iscapable of making decisions on hisown behalf. 6. Skin/Wound Care: Routine skin checks 7. Fluids/Electrolytes/Nutrition: Routine I&Os 8.Hypertension.   Currently maintained on HCTZ 25 mg daily, Norvasc 10 mg daily.   bordeline to high---consider addition of third agent 9. Diabetes mellitus. Hemoglobin A1c 8.1. Glucophage 500 mg twice a day. Check blood sugars before meals and at bedtime. Diabetic teaching  Relatively controlled on 8/13 10. Hyperlipidemia. Lipitor 11.Obesity. BMI 32.28 kg/m2 12. Hyponatremia  Na+ 135 on 8/13  Cont to monitor 13. AKI  Cr 1.38 on 8/13  Encourage fluids  Cont to monitor 14. Hypokalemia  K+ 2.8 8/13  -further supplementation today 15. Sleep disturbance  Trazodone 50 mg prn  LOS (Days) 4 A FACE TO FACE EVALUATION WAS PERFORMED  Kavina Cantave T 03/27/2017 8:37 AM

## 2017-03-28 ENCOUNTER — Inpatient Hospital Stay (HOSPITAL_COMMUNITY): Payer: Medicaid Other | Admitting: Physical Therapy

## 2017-03-28 ENCOUNTER — Inpatient Hospital Stay (HOSPITAL_COMMUNITY): Payer: Medicaid Other | Admitting: Occupational Therapy

## 2017-03-28 ENCOUNTER — Inpatient Hospital Stay (HOSPITAL_COMMUNITY): Payer: Self-pay | Admitting: Physical Therapy

## 2017-03-28 LAB — GLUCOSE, CAPILLARY
GLUCOSE-CAPILLARY: 126 mg/dL — AB (ref 65–99)
GLUCOSE-CAPILLARY: 104 mg/dL — AB (ref 65–99)
GLUCOSE-CAPILLARY: 114 mg/dL — AB (ref 65–99)
GLUCOSE-CAPILLARY: 113 mg/dL — AB (ref 65–99)

## 2017-03-28 LAB — BASIC METABOLIC PANEL
Anion gap: 12 (ref 5–15)
BUN: 30 mg/dL — ABNORMAL HIGH (ref 6–20)
CHLORIDE: 100 mmol/L — AB (ref 101–111)
CO2: 22 mmol/L (ref 22–32)
Calcium: 9.5 mg/dL (ref 8.9–10.3)
Creatinine, Ser: 1.67 mg/dL — ABNORMAL HIGH (ref 0.61–1.24)
GFR calc non Af Amer: 47 mL/min — ABNORMAL LOW (ref >60)
GFR, EST AFRICAN AMERICAN: 54 mL/min — AB (ref >60)
Glucose, Bld: 138 mg/dL — ABNORMAL HIGH (ref 65–99)
POTASSIUM: 3.5 mmol/L (ref 3.5–5.1)
SODIUM: 134 mmol/L — AB (ref 135–145)

## 2017-03-28 MED ORDER — POTASSIUM CHLORIDE CRYS ER 20 MEQ PO TBCR
20.0000 meq | EXTENDED_RELEASE_TABLET | Freq: Two times a day (BID) | ORAL | Status: DC
  Administered 2017-03-28 – 2017-03-31 (×6): 20 meq via ORAL
  Filled 2017-03-28 (×6): qty 1

## 2017-03-28 NOTE — Progress Notes (Signed)
Physical Therapy Session Note  Patient Details  Name: Zachary [name] MRN: [id number] Date of Birth: 01/15/1971  Today's Date: 03/28/2017 PT Individual Time: 6812-7517 PT Individual Time Calculation (min): 75 min   Short Term Goals: Week 1:  PT Short Term Goal 1 (Week 1): Pt will perform bed to chair transfer with min assist. PT Short Term Goal 2 (Week 1): Pt will ambulate 5 ft wtih mod assist of 1 with LRAD. PT Short Term Goal 3 (Week 1): Pt will stand x 2 min without LOB with min assist while performing functional UE activity.  Skilled Therapeutic Interventions/Progress Updates:    no c/o pain. PT treatment session focused on L LE NMR during gait, stair negotiation, standing balance, L UE coordination, L visual scanning and attention.   Pt supine in bed upon arrival, agreeable to PT session. Supine to sit with HOB elevated, using bed rails with supervision for safety. Squat pivot bed to w/c with min assist for steadying. W/c mobility 171f to/from therapy gym using B LEs with supervision for safety and pt able to navigate without cues. Stand pivot mat <> w/c with min assist throughout session. Ambulated 159fx 2, 253f2 progressed to [id number] with min assist throughout and verbal cues for L LE placement, R LE step length, L LE clearance during swing and tactile cues for L weight shift during stance. Ascended/descended 4 stairs x 2, seated break between, using B UEs progressed to using just R handrail with mod assist for balance and verbal cues for sequencing LEs. Standing on airex with min assist for balance while performing PEG board pattern of moderate difficulty focused on L UE coordination, L attention, and standing balance. PT provided min cues to begin PEG board pattern with verbal and tactile cues for L LE knee extension and L weight shift. Sitting clothes pin task with L UE focused on coordination and L attention. Returned to room in w/c with call bell in reach and needs met.    Therapy  Documentation Precautions:  Precautions Precautions: Fall Precaution Comments:  (Left hemiparesis) Restrictions Weight Bearing Restrictions: No  See Function Navigator for Current Functional Status.   Therapy/Group: Individual Therapy  Sissy Goetzke 03/28/2017, 11:58 AM

## 2017-03-28 NOTE — Progress Notes (Signed)
Occupational Therapy Session Note  Patient Details  Name: Zachary [name] MRN: [id number] Date of Birth: 01/18/1971  Today's Date: 03/28/2017 OT Individual Time: 4696-2952 and 8413-2440 OT Individual Time Calculation (min): 56 min and 43 min    Short Term Goals: Week 1:  OT Short Term Goal 1 (Week 1): Pt will complete shower transfer with Min A OT Short Term Goal 2 (Week 1): Pt will complete sit<stand for LB dressing with Min A OT Short Term Goal 3 (Week 1): Pt will complete 1/3 components of toileting tasks  Skilled Therapeutic Interventions/Progress Updates:   Session 1: Upon entering the room, pt requesting to shower. Pt ambulating with RW and min A to gather self care items. Pt sitting onto TTB for bathing with min cues to utilize L UE for self care tasks. Pt standing with steady assistance for balance when washing buttocks and peri area. Pt exited the bathroom in same manner and donned clothing items from wheelchair at sink with sit <>stand. Pt reports fatigue from self care task. OT educated pt on energy conservation technique and provided paper handout. Education will continue. Pt remained seated in wheelchair at end of session with call bell and all needed items within reach.   Session 2: Upon entering the room, pt supine in bed resting but agreeable to OT intervention. Pt with no c/o pain this session. Skilled OT intervention with focus on functional transfers in simulated home environment. Pt performed lateral scoot transfer with supervision and set up A from bed >wheelchair. Pt propelled wheelchair with B LE's 150' to ADL apartment to increase endurance and strength. OT educated and demonstrated use of RW to transfer onto TTB in tub shower. Pt returning demonstration and ambulating into bathroom with RW with steady assistance for safety while transferring onto TTB. OT recommended purchase of safety treads and non-slip bath mat to decrease fall risk. Pt exited and transferred onto 28' bed  in apartment with close supervision. Pt also performed transfer onto low, soft sofa similar to home with assistance for lifting from sofa surface. OT assisted pt back to room secondary to fatigue. Call bell and all needed items within reach upon exiting the room.   Therapy Documentation Precautions:  Precautions Precautions: Fall Precaution Comments:  (Left hemiparesis) Restrictions Weight Bearing Restrictions: No General:   Vital Signs: Therapy Vitals Temp: 98.2 F (36.8 C) Temp Source: Oral Pulse Rate: 93 Resp: 20 BP: (!) 150/89 Patient Position (if appropriate): Lying Oxygen Therapy SpO2: 98 % O2 Device: Not Delivered Pain:   ADL: ADL ADL Comments: Please see functional navigator for ADL status Vision   Perception    Praxis   Exercises:   Other Treatments:    See Function Navigator for Current Functional Status.   Therapy/Group: Individual Therapy  Gypsy Decant 03/28/2017, 8:32 AM

## 2017-03-28 NOTE — Progress Notes (Signed)
Physical Therapy Session Note  Patient Details  Name: Zachary [name] MRN: [id number] Date of Birth: 01/28/1971  Today's Date: 03/28/2017 PT Individual Time: 1100-1130 PT Individual Time Calculation (min): 30 min   Short Term Goals: Week 1:  PT Short Term Goal 1 (Week 1): Pt will perform bed to chair transfer with min assist. PT Short Term Goal 2 (Week 1): Pt will ambulate 5 ft wtih mod assist of 1 with LRAD. PT Short Term Goal 3 (Week 1): Pt will stand x 2 min without LOB with min assist while performing functional UE activity.  Skilled Therapeutic Interventions/Progress Updates: Pt received seated in w/c, denies pain and agreeable to treatment. W/c propulsion to gym with BLE for strengthening and coordination. Gait x100' with RW and S; cues for heel-toe gait pattern on LLE. Two gait trials x100' and x300' with no AD, L HHA and R handrail progressed to no RUE support with repetition. Pt cued for increased gait speed to facilitate central pattern generators and improve coordination, LLE activation. Occasional mild LOBs requiring modA d/t L hip instability with inconsistent trendelenburg. Side stepping R/L in parallel bars with light BUE support, cues for upright posture, while performing weighted box kick with BLE for open/closed chain L hip abduction. Standing RLE step taps to 4" step for focus on LLE stance control, weight shifting, dynamic balance. BUE support progressed to one UE to no UE support with min guard overall. Occasional slow buckle of LLE during stance, improved with cueing. Gait to return to room with L HHA and min/modA x175'. Remained supine in bed at end of session, all needs in reach.      Therapy Documentation Precautions:  Precautions Precautions: Fall Precaution Comments:  (Left hemiparesis) Restrictions Weight Bearing Restrictions: No Pain: Pain Assessment Pain Assessment: No/denies pain   See Function Navigator for Current Functional Status.   Therapy/Group:  Individual Therapy  Luberta Mutter 03/28/2017, 11:23 AM

## 2017-03-28 NOTE — Progress Notes (Signed)
[place] PHYSICAL MEDICINE & REHABILITATION     PROGRESS NOTE  Subjective/Complaints:  No new problems. Therapy progressing. Denies pain  ROS: pt denies nausea, vomiting, diarrhea, cough, shortness of breath or chest pain   Objective: Vital Signs: Blood pressure (!) 150/89, pulse 93, temperature 98.2 F (36.8 C), temperature source Oral, resp. rate 20, SpO2 98 %. No results found.  Recent Labs  03/27/17 0455  WBC 7.0  HGB 14.9  HCT 43.7  PLT 403*    Recent Labs  03/27/17 0455 03/28/17 0449  NA 135 134*  K 2.8* 3.5  CL 96* 100*  GLUCOSE 113* 138*  BUN 23* 30*  CREATININE 1.38* 1.67*  CALCIUM 9.4 9.5   CBG (last 3)   Recent Labs  03/27/17 1636 03/27/17 2114 03/28/17 0712  GLUCAP 121* 130* 126*    Wt Readings from Last 3 Encounters:  03/18/17 102.1 kg (225 lb)    Physical Exam:  BP (!) 150/89 (BP Location: Left Arm)   Pulse 93   Temp 98.2 F (36.8 C) (Oral)   Resp 20   SpO2 98%  Constitutional: NAD. He appears well-developed and well-nourished.  HENT: Normocephalic. Atraumatic.  Eyes: EOMare normal. No discharge.  Cardiovascular: RRR without murmur. No JVD . Respiratory: CTA Bilaterally without wheezes or rales. Normal effort .  GI: Bowel sounds are normal. He exhibits no distension.  Neurological. Alert and oriented Mild facial droop Motor: RUE/RLE: 5/5 proximal to distal LUE: 4- to 4/5 proximal to distal. LLE: 4/5--stable Sensation 1+/2 Left arm and leg. Decreased FMC Skin. Warm and dry. Intact.  Psych: Normal mood and behavior.   Assessment/Plan: 1. Functional deficits secondary to right pontine small vessel infarct which require 3+ hours per day of interdisciplinary therapy in a comprehensive inpatient rehab setting. Physiatrist is providing close team supervision and 24 hour management of active medical problems listed below. Physiatrist and rehab team continue to assess barriers to discharge/monitor patient progress toward functional and  medical goals.  Function:  Bathing Bathing position   Position: Shower  Bathing parts Body parts bathed by patient: Right arm, Left arm, Chest, Abdomen, Front perineal area, Right upper leg, Left upper leg, Right lower leg, Left lower leg, Buttocks Body parts bathed by helper: Back  Bathing assist Assist Level: Touching or steadying assistance(Pt > 75%)      Upper Body Dressing/Undressing Upper body dressing   What is the patient wearing?: Pull over shirt/dress     Pull over shirt/dress - Perfomed by patient: Thread/unthread right sleeve, Thread/unthread left sleeve, Put head through opening, Pull shirt over trunk          Upper body assist Assist Level: Set up, Supervision or verbal cues   Set up : To obtain clothing/put away  Lower Body Dressing/Undressing Lower body dressing   What is the patient wearing?: Underwear, Pants, Socks, Shoes Underwear - Performed by patient: Thread/unthread right underwear leg, Thread/unthread left underwear leg Underwear - Performed by helper: Pull underwear up/down Pants- Performed by patient: Thread/unthread right pants leg, Thread/unthread left pants leg, Pull pants up/down       Socks - Performed by patient: Don/doff right sock, Don/doff left sock   Shoes - Performed by patient: Don/doff right shoe, Don/doff left shoe Shoes - Performed by helper: Fasten right, Fasten left          Lower body assist Assist for lower body dressing: Touching or steadying assistance (Pt > 75%)      Toileting Toileting Toileting activity did not occur:  No continent bowel/bladder event Toileting steps completed by patient: Adjust clothing prior to toileting, Performs perineal hygiene, Adjust clothing after toileting Toileting steps completed by helper: Adjust clothing prior to toileting Toileting Assistive Devices: Grab bar or rail  Toileting assist Assist level: Touching or steadying assistance (Pt.75%)   Transfers Chair/bed transfer   Chair/bed  transfer method: Stand pivot Chair/bed transfer assist level: Touching or steadying assistance (Pt > 75%) Chair/bed transfer assistive device: Armrests     Locomotion Ambulation     Max distance: 10' Assist level: Touching or steadying assistance (Pt > 75%)   Wheelchair   Type: Manual Max wheelchair distance: 139ft Assist Level: Supervision or verbal cues  Cognition Comprehension Comprehension assist level: Follows complex conversation/direction with no assist  Expression Expression assist level: Expresses complex ideas: With no assist  Social Interaction Social Interaction assist level: Interacts appropriately with others - No medications needed.  Problem Solving Problem solving assist level: Solves complex problems: Recognizes & self-corrects  Memory Memory assist level: Complete Independence: No helper    Medical Problem List and Plan: 1. Left hemiparesissecondary to right pontine small vessel infarct on 8/4  Cont CIR--making nice functional progress 2. DVT Prophylaxis/Anticoagulation: SCDs. Monitor for any signs of DVT 3. Pain Management/headaches: Topamax 50 mg twice a day--controlled 4. Mood: Provide emotional support 5. Neuropsych: This patient iscapable of making decisions on hisown behalf. 6. Skin/Wound Care: Routine skin checks 7. Fluids/Electrolytes/Nutrition: Routine I&Os 8.Hypertension.   Currently maintained on HCTZ 25 mg daily, Norvasc 10 mg daily.   bordeline with improvement---consider addition of third agent if needed 9. Diabetes mellitus. Hemoglobin A1c 8.1. Glucophage 500 mg twice a day. Check blood sugars before meals and at bedtime. Diabetic teaching  Relatively controlled on 8/13 10. Hyperlipidemia. Lipitor 11.Obesity. BMI 32.28 kg/m2 12. Hyponatremia  Na+ 134 on 8/14  Cont to monitor 13. AKI  Cr 1.67on 8/14  Continue to Encourage 14. Hypokalemia  K+ up to 3.5 8/14  -further supplementation added 15. Sleep disturbance  Trazodone 50 mg  prn  LOS (Days) 5 A FACE TO FACE EVALUATION WAS PERFORMED  Tanyia Grabbe T 03/28/2017 8:42 AM

## 2017-03-29 ENCOUNTER — Inpatient Hospital Stay (HOSPITAL_COMMUNITY): Payer: Medicaid Other | Admitting: Physical Therapy

## 2017-03-29 ENCOUNTER — Encounter (HOSPITAL_COMMUNITY): Payer: Self-pay | Admitting: Psychology

## 2017-03-29 ENCOUNTER — Inpatient Hospital Stay (HOSPITAL_COMMUNITY): Payer: Medicaid Other | Admitting: Occupational Therapy

## 2017-03-29 DIAGNOSIS — F4323 Adjustment disorder with mixed anxiety and depressed mood: Secondary | ICD-10-CM

## 2017-03-29 LAB — GLUCOSE, CAPILLARY
GLUCOSE-CAPILLARY: 118 mg/dL — AB (ref 65–99)
GLUCOSE-CAPILLARY: 114 mg/dL — AB (ref 65–99)
Glucose-Capillary: 115 mg/dL — ABNORMAL HIGH (ref 65–99)
Glucose-Capillary: 102 mg/dL — ABNORMAL HIGH (ref 65–99)

## 2017-03-29 MED ORDER — LIVING WELL WITH DIABETES BOOK
Freq: Once | Status: AC
  Administered 2017-03-29: 04:00:00
  Filled 2017-03-29: qty 1

## 2017-03-29 NOTE — Progress Notes (Signed)
Occupational Therapy Session Note  Patient Details  Name: Zachary [name] MRN: [id number] Date of Birth: August 15, 1971  Today's Date: 03/29/2017 OT Individual Time: 1500-1600 OT Individual Time Calculation (min): 60 min    Short Term Goals: Week 1:  OT Short Term Goal 1 (Week 1): Pt will complete shower transfer with Min A OT Short Term Goal 2 (Week 1): Pt will complete sit<stand for LB dressing with Min A OT Short Term Goal 3 (Week 1): Pt will complete 1/3 components of toileting tasks  Skilled Therapeutic Interventions/Progress Updates:    Upon entering the room, pt seated in wheelchair awaiting therapist. Pt with reports of fatigue from prior therapy sessions. OT assisted pt outside for energy conservation via wheelchair. OT discussing pt progress this week and discharge plans for 8/24. Pt having several questions regarding discharge answered by therapist this session. Pt ambulating on uneven surface and slopes with use of RW and min A for safety. Pt required increased time as well but ambulating 150' before taking seated rest break. Pt propelled wheelchair 150' back towards room with B UE's with min A for steering. Pt gripping and pushing wheelchair with L UE. Pt assisted the rest of the way to room secondary to fatigue. Pt ambulated 10' from wheelchair >recliner chair with RW and steady assistance. Call bell and all needed items within reach upon exiting the room.   Therapy Documentation Precautions:  Precautions Precautions: Fall Precaution Comments:  (Left hemiparesis) Restrictions Weight Bearing Restrictions: No General:   Vital Signs: Therapy Vitals Temp: 98.9 F (37.2 C) Temp Source: Oral Pulse Rate: 97 Resp: 18 BP: (!) 146/90 Patient Position (if appropriate): Sitting Oxygen Therapy SpO2: 98 % O2 Device: Not Delivered Pain:   ADL: ADL ADL Comments: Please see functional navigator for ADL status Vision   Perception    Praxis   Exercises:   Other Treatments:     See Function Navigator for Current Functional Status.   Therapy/Group: Individual Therapy  Gypsy Decant 03/29/2017, 4:23 PM

## 2017-03-29 NOTE — Progress Notes (Signed)
[place] PHYSICAL MEDICINE & REHABILITATION     PROGRESS NOTE  Subjective/Complaints:  Had a good night. No new issues.   ROS: pt denies nausea, vomiting, diarrhea, cough, shortness of breath or chest pain   Objective: Vital Signs: Blood pressure (!) 143/87, pulse 90, temperature 97.7 F (36.5 C), temperature source Oral, resp. rate 18, height 5\' 10"  (1.778 m), weight 102.1 kg (225 lb), SpO2 100 %. No results found.  Recent Labs  03/27/17 0455  WBC 7.0  HGB 14.9  HCT 43.7  PLT 403*    Recent Labs  03/27/17 0455 03/28/17 0449  NA 135 134*  K 2.8* 3.5  CL 96* 100*  GLUCOSE 113* 138*  BUN 23* 30*  CREATININE 1.38* 1.67*  CALCIUM 9.4 9.5   CBG (last 3)   Recent Labs  03/28/17 1625 03/28/17 2205 03/29/17 0644  GLUCAP 114* 113* 118*    Wt Readings from Last 3 Encounters:  03/23/17 102.1 kg (225 lb)  03/18/17 102.1 kg (225 lb)    Physical Exam:  BP (!) 143/87 (BP Location: Left Arm)   Pulse 90   Temp 97.7 F (36.5 C) (Oral)   Resp 18   Ht 5\' 10"  (1.778 m)   Wt 102.1 kg (225 lb)   SpO2 100%   BMI 32.28 kg/m  Constitutional: NAD. He appears well-developed and well-nourished.  HENT: Normocephalic. Atraumatic.  Eyes: EOMare normal. No discharge.  Cardiovascular: RRR without murmur. No JVD  . Respiratory: CTA Bilaterally without wheezes or rales. Normal effort  .  GI: Bowel sounds are normal. He exhibits no distension.  Neurological. Alert and oriented Mild facial droop Motor: RUE/RLE: 5/5 proximal to distal LUE: 4- to 4/5 proximal to distal. LLE: 4/5--unchanged Sensation 1+/2 Left arm and leg. Decreased FMC Skin. Warm and dry. Intact.  Psych: Normal mood and behavior.   Assessment/Plan: 1. Functional deficits secondary to right pontine small vessel infarct which require 3+ hours per day of interdisciplinary therapy in a comprehensive inpatient rehab setting. Physiatrist is providing close team supervision and 24 hour management of active medical  problems listed below. Physiatrist and rehab team continue to assess barriers to discharge/monitor patient progress toward functional and medical goals.  Function:  Bathing Bathing position   Position: Shower  Bathing parts Body parts bathed by patient: Right arm, Left arm, Chest, Abdomen, Front perineal area, Right upper leg, Left upper leg, Right lower leg, Left lower leg, Buttocks Body parts bathed by helper: Back  Bathing assist Assist Level: Touching or steadying assistance(Pt > 75%)      Upper Body Dressing/Undressing Upper body dressing   What is the patient wearing?: Pull over shirt/dress     Pull over shirt/dress - Perfomed by patient: Thread/unthread right sleeve, Thread/unthread left sleeve, Put head through opening, Pull shirt over trunk          Upper body assist Assist Level: Set up, Supervision or verbal cues   Set up : To obtain clothing/put away  Lower Body Dressing/Undressing Lower body dressing   What is the patient wearing?: Underwear, Pants, Socks, Shoes Underwear - Performed by patient: Thread/unthread right underwear leg, Thread/unthread left underwear leg Underwear - Performed by helper: Pull underwear up/down Pants- Performed by patient: Thread/unthread right pants leg, Thread/unthread left pants leg, Pull pants up/down       Socks - Performed by patient: Don/doff right sock, Don/doff left sock   Shoes - Performed by patient: Don/doff right shoe, Don/doff left shoe Shoes - Performed by helper: Fasten right,  Fasten left          Lower body assist Assist for lower body dressing: Touching or steadying assistance (Pt > 75%)      Toileting Toileting Toileting activity did not occur: No continent bowel/bladder event Toileting steps completed by patient: Adjust clothing prior to toileting, Performs perineal hygiene, Adjust clothing after toileting Toileting steps completed by helper: Adjust clothing prior to toileting Toileting Assistive Devices:  Grab bar or rail  Toileting assist Assist level: Touching or steadying assistance (Pt.75%)   Transfers Chair/bed transfer   Chair/bed transfer method: Squat pivot Chair/bed transfer assist level: Touching or steadying assistance (Pt > 75%) Chair/bed transfer assistive device: Armrests     Locomotion Ambulation     Max distance: 38ft Assist level: Touching or steadying assistance (Pt > 75%)   Wheelchair   Type: Manual Max wheelchair distance: 152ft Assist Level: Supervision or verbal cues  Cognition Comprehension Comprehension assist level: Follows complex conversation/direction with no assist  Expression Expression assist level: Expresses complex ideas: With no assist  Social Interaction Social Interaction assist level: Interacts appropriately with others - No medications needed.  Problem Solving Problem solving assist level: Solves complex problems: Recognizes & self-corrects  Memory Memory assist level: Complete Independence: No helper    Medical Problem List and Plan: 1. Left hemiparesissecondary to right pontine small vessel infarct on 8/4  Cont CIR--making nice functional progress  -reviewed dc goals. Might need a w/c to be mod I during day while wife works 2. DVT Prophylaxis/Anticoagulation: SCDs. Monitor for any signs of DVT 3. Pain Management/headaches: Topamax 50 mg twice a day--controlled 4. Mood: Provide emotional support 5. Neuropsych: This patient iscapable of making decisions on hisown behalf. 6. Skin/Wound Care: Routine skin checks 7. Fluids/Electrolytes/Nutrition: Routine I&Os 8.Hypertension.   Currently maintained on HCTZ 25 mg daily, Norvasc 10 mg daily.   bordeline with improvement overall---consider addition of third agent if needed 9. Diabetes mellitus. Hemoglobin A1c 8.1. Glucophage 500 mg twice a day. Check blood sugars before meals and at bedtime. Diabetic teaching  Relatively controlled on 8/13 10. Hyperlipidemia. Lipitor 11.Obesity. BMI  32.28 kg/m2 12. Hyponatremia  Na+ 134 on 8/14  Cont to monitor 13. AKI  Cr 1.67on 8/14  Continue to Encourage 14. Hypokalemia  K+ up to 3.5 8/14  -further supplementation added 15. Sleep disturbance  Trazodone 50 mg prn  LOS (Days) 6 A FACE TO FACE EVALUATION WAS PERFORMED  Drake Wuertz T 03/29/2017 8:44 AM

## 2017-03-29 NOTE — Progress Notes (Signed)
Social Work Patient ID: Zachary [name], male   DOB: Dec 28, 1970, 46 y.o.   MRN: [id number]   CSW met with pt to update him on team conference discussion and targeted d/c date of 04-07-17.  Pt was pleased with this.  Later, CSW had a phone conversation with pt's wife and his sister (who lives in [place].).  Wife plans to take some time off from work and then work from home so that she can be with pt.  Sister stated she could come after that so that pt would have more time with someone with him.  CSW stated that it is the team's recommendation that he have supervision, that he would be limited in what he can do if someone is not with him, but that he can do more physically with supervision.  They expressed understanding and will let CSW know what they end up working out.  They are looking into FMLA with their employers and will call pt's employer to see what benefits for which he is eligible.  CSW remains available to assist as needed.

## 2017-03-29 NOTE — Progress Notes (Signed)
Physical Therapy Session Note  Patient Details  Name: Zachary [name] MRN: [id number] Date of Birth: Oct 18, 1970  Today's Date: 03/29/2017 PT Individual Time: 0910-1005 and 1306-1406 PT Individual Time Calculation (min): 55 min and 60 min   Short Term Goals: Week 1:  PT Short Term Goal 1 (Week 1): Pt will perform bed to chair transfer with min assist. PT Short Term Goal 2 (Week 1): Pt will ambulate 5 ft wtih mod assist of 1 with LRAD. PT Short Term Goal 3 (Week 1): Pt will stand x 2 min without LOB with min assist while performing functional UE activity.    Skilled Therapeutic Interventions/Progress Updates:  Treatment 1: Pt received in room & agreeable to tx. Pt denied c/o pain, only reporting L side is "tight"; RN present administering meds. Pt with strong urine smell reporting he spilled urinal in bed; encouraged pt to call for assistance for cleanup or to ambulate to bathroom. Pt stood at sink with supervision while performing lower body washing. Pt able to doff & don clothing with set up assist & supervision for standing balance. Pt ambulates room>gym>room with RW & min assist with slight tactile cuing for weight shifting L and cuing for improved quality of gait, reduce gait speed, and for forward gaze with good return demo. After gait BP = 155/89 mmHg, HR = 103 bpm. Pt utilized cybex kinetron in standing with BUE>LUE support with min assist and mulitmodal cuing for upright posture, weight shifting L and cuing for breathing. At end of session pt left sitting in w/c in room with all needs within reach. BP at end of session = 147/103 mmHg, HR = 107 bpm.    Treatment 2: Pt received in room & agreeable to tx, denying c/o pain. Session focused on gait, LLE strengthening & NMR, endurance & strength training. Pt completes sit<>stand transfers with supervision and cuing for proper foot placement with pt pushing up on rails. Pt ambulates room>gym>dayroom>room with RW & min assist with therapist providing  tactile cuing for increased weight shifting L, cuing to decrease reliance on BUE & to decrease gait speed to increase safety. BP once in gym = 165/97 mmHg, HR = 103 bpm. Pt completed lateral step ups on 6" step with BUE>LUE support with task focusing on LLE coordination, strengthening & NMR; pt requires seated rest breaks after ~10 reps 2/2 fatigue. Pt completed standing step taps (3") without AD and mod assist with task focusing on coordination, weight shifting L<>R, and balance. Pt utilized nu-step on level 2 x 8 minutes with BLE & LUE with task focusing on endurance training & L NMR. Pt reports 6/10 RPE and requires 1 rest break 2/2 fatigue. Educated pt on stroke recovery. At end of session pt left sitting in w/c in room with all needs within reach.     Therapy Documentation Precautions:  Precautions Precautions: Fall Precaution Comments:  (Left hemiparesis) Restrictions Weight Bearing Restrictions: No   See Function Navigator for Current Functional Status.   Therapy/Group: Individual Therapy  [name] 03/29/2017, 4:11 PM

## 2017-03-29 NOTE — Progress Notes (Signed)
Occupational Therapy Session Note  Patient Details  Name: Zachary [name] MRN: [id number] Date of Birth: Oct 16, 1970  Today's Date: 03/29/2017 OT Individual Time: 1032-1100 OT Individual Time Calculation (min): 28 min    Short Term Goals: Week 1:  OT Short Term Goal 1 (Week 1): Pt will complete shower transfer with Min A OT Short Term Goal 2 (Week 1): Pt will complete sit<stand for LB dressing with Min A OT Short Term Goal 3 (Week 1): Pt will complete 1/3 components of toileting tasks  Skilled Therapeutic Interventions/Progress Updates:    Treatment session with focus on standing balance, weight shift, and LUE NMR.  Engaged in fine and gross motor control activity in standing with focus on weight shifting to Lt to increase symmetrical weight bearing through BLE.  Placed 2" step under RLE to promote weight shift to Lt while engaging in fine motor control task requiring pt to pick up small items and place them on target.  Continued to increase challenge with standing balance encouraging pt to weight shift to Lt to place items in container outside BOS requiring pt to reach up and to Lt.  Therapist provided min guard during all standing and reaching activity, with tactile cues at trunk to promote weight shift.    Therapy Documentation Precautions:  Precautions Precautions: Fall Precaution Comments:  (Left hemiparesis) Restrictions Weight Bearing Restrictions: No General:   Vital Signs: Therapy Vitals Pulse Rate: (!) 107 BP: (!) 147/103 Patient Position (if appropriate): Sitting Pain:  Pt with no c/o pain  See Function Navigator for Current Functional Status.   Therapy/Group: Individual Therapy  Simonne Come 03/29/2017, 12:23 PM

## 2017-03-29 NOTE — Patient Care Conference (Signed)
Inpatient RehabilitationTeam Conference and Plan of Care Update Date: 03/28/2017   Time: 2:30 PM    Patient Name: Zachary [name]      Medical Record Number: [id number]  Date of Birth: 03-Sep-1970 Sex: Male         Room/Bed: 4W08C/4W08C-01 Payor Info: Payor: MEDICAID POTENTIAL / Plan: MEDICAID POTENTIAL / Product Type: *No Product type* /    Admitting Diagnosis: R CVA  Admit Date/Time:  03/23/2017  4:53 PM Admission Comments: No comment available   Primary Diagnosis:  <principal problem not specified> Principal Problem: <principal problem not specified>  Patient Active Problem List   Diagnosis Date Noted  . Sleep disturbance   . Hyperlipidemia LDL goal <70   . Hypokalemia   . Hyponatremia   . AKI (acute kidney injury) ([place])   . Diabetes mellitus type 2 in nonobese ([facility])   . Benign essential HTN   . Hemiparesis affecting left side as late effect of cerebrovascular accident (CVA) ([place]) 03/23/2017  . Gait disturbance, post-stroke 03/23/2017  . Right pontine cerebrovascular accident ([place]) 03/23/2017  . Left pontine stroke ([facility]) - Right paramedian pontine infarct likely secondary to small vessel disease versus symptomatic terminal right vertebral artery stenosis. 03/18/2017    Expected Discharge Date: Expected Discharge Date: 04/07/17  Team Members Present: Physician leading conference: Dr. Alger Simons Social Worker Present: Alfonse Alpers, LCSW Nurse Present: Mamie Nick)  Felizardo Hoffmann Troxler, RN) PT Present: Canary Brim, PT OT Present: Benay Pillow, OT SLP Present: Stormy Fabian, SLP PPS Coordinator present : Ileana Ladd, PT     Current Status/Progress Goal Weekly Team Focus  Medical   right pontomedullary infarct with left hemiparesis,HTN  improve functional use of left side  bp control, stroke education   Bowel/Bladder   Continent bowel/bladder LBM 03/25/17  To remain continet while in [facility]  Assess bowel/bladder function q shift ans as needed   Swallow/Nutrition/ Hydration      ADL's   Min A bathing shower level, Mod A shower transfers, Supervision UB dressing, Min A LB dressing, Mod A toilet transfers + toileting tasks  Supervision overall   Lt NMR, balance, endurance, pt education, functional transfers, d/c planning   Mobility   min assist ambulation with RW short distances, min assist transfers  upgraded to supervision overall  pt education, transfers, gait, balance, NMR   Communication             Safety/Cognition/ Behavioral Observations            Pain   Denied pain or discomfort  <2  Assess and treat pain q shift and as needed   Skin   No skin issue  skin to be free of breakdown/infection with min assist  assess skin q shift and as needed    Rehab Goals Patient on target to meet rehab goals: Yes Rehab Goals Revised: some goals have been upgraded due to pt's good progress *See Care Plan and progress notes for long and short-term goals.     Barriers to Discharge  Current Status/Progress Possible Resolutions Date Resolved   Physician    Medical stability        bp control, continued therapy      Nursing  Home environment access/layout;Decreased caregiver support               PT                    OT  SLP                SW     Pt's family is working on 24/7 supervision for the first few weeks pt is home.          Discharge Planning/Teaching Needs:  Pt plans to go to his home with his wife, wife's cousin to assist, and possible pt's sister from [place]. to come.  Pt will 24/7 supervision for a few weeks.  Pt's wife will be offered family education closer to d/c.   Team Discussion:  Pt with some sensory loss following right pontomedullary infarct.  Labile BP is getting better.  Pt is motivated to work and recover.  Pt with supervision level goals with therapies.  Pt currently at min A with functional tasks, requiring more mod A when fatigued.  Pt with poor endurance, but is min A with short distance ambulation and mod A with 8  stairs.  PT stated that they expect pt will be able to do stairs by d/c.  Revisions to Treatment Plan:  none    Continued Need for Acute Rehabilitation Level of Care: The patient requires daily medical management by a physician with specialized training in physical medicine and rehabilitation for the following conditions: Daily direction of a multidisciplinary physical rehabilitation program to ensure safe treatment while eliciting the highest outcome that is of practical value to the patient.: Yes Daily medical management of patient stability for increased activity during participation in an intensive rehabilitation regime.: Yes Daily analysis of laboratory values and/or radiology reports with any subsequent need for medication adjustment of medical intervention for : Blood pressure problems  Carley Strickling, Silvestre Mesi 03/29/2017, 11:32 AM

## 2017-03-29 NOTE — Consult Note (Signed)
Neuropsychological Consultation   Patient:   Zachary [name]   DOB:   [birth year]  MR Number:  [id number]  Location:  Zachary [name] [id number] [place] [address] Dept: [phone] Loc: [phone]           Date of Service:   03/29/2017  Start Time:   11 AM End Time:   12 PM  Provider/Observer:  Ilean Skill, Psy.D.       Clinical Neuropsychologist       Billing Code/Service: [id number] 4 Units  Chief Complaint:    Zachary [name] is [name] 46 year old right-handed male who has [name] history of obesity, hypertension. The patient presented on 03/18/2017 with severe dizziness, left-sided weakness and numbness. The patient received TPA. MRI showed acute infarction in the right paramedian inferior pons extending into the medulla. There were numerous areas of chronic hemorrhage throughout the brain. The patient has been coping with the residual effects of his right hemisphere CVA.  Due to adjustment issues and concerns about residual cognitive effects [name] neuropsychological consultation was requested.    Reason for Service:  Zachary [name] was referred for neuropsychological consultation due to residual effects of recent CVA.  Zachary [name] has [name] history of chronic hypertension and noncompliance with medication due to financial concerns. Below is the full history of present illness for the current admission.  HPI: Zachary [name] 46 y.o.right handed malewith reported history of obesity, hypertension. Patient has not taken his blood pressure medication in approximately 9 months because of financial reasons. Blood pressure 238/136and placed on cleviprexand labetalol. Per chart review patient lives with spouseand 2 children ages 70 and 43. Independent prior to admission and works at [employer] in addition to [name] second job. 2 level home with bedroom upstairs.Wife works during the day.Zachary [name] presented 03/18/2017 with severe  dizziness, left-sided weakness and numbness. Cranial CT scan negative. Patient did receive TPA. MRI showed acute infarction right paramedian inferior pons extending into the medulla. 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 UDS negative.CT angio head and neck negative for emergent large vessel occlusion. 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 EEG negative for seizure. Neurology consulted currently on aspirin and Plavix for CVA prophylaxis. Findings of elevated hemoglobin A1c 8.1 with diabetic coordinator consulted. Intermittent bouts of headaches with Topamax initiated. Tolerating [name] regular diet. Physical and occupational therapy evaluations completed with recommendations of physical medicine rehabilitation consult.Patient was admitted for [name] comprehensive rehabilitation program  Current Status:  The patient has been improving over the past couple of days but reports that Zachary [name] is continuing to have some anxiety and stress about his recovery process. The patient was administered the Mini-Mental status examination as well as the Montral cognitive assessment. The patient's performance on both these measures was quite good. Zachary [name] did have some difficulty with recall of 3 objects but was able to identify and recall 2 of 3 objects after [name] period of delay. The patient's attention and concentration appeared to be adequate. His language functioning in this brief cognitive measure was also within normal limits.  Reliability of Information: Information is derived from 1 hour face-to-face interaction with the patient as well as review of available medical records and discussion of clinical course with treatment team members.  Behavioral Observation: Zachary [name]  presents as [name] 46 y.o.-year-old Right African American Male who  appeared his stated age. his dress was  Appropriate and Zachary [name] was Well Groomed and his manners were Appropriate to the situation.  his participation was indicative of Appropriate and Attentive behaviors.  There were  physical disabilities noted.  Zachary [name] displayed an appropriate level of cooperation and motivation.     Interactions:    Active Appropriate and Attentive  Attention:   within normal limits and attention span and concentration were age appropriate  Memory:   abnormal; remote memory intact, recent memory impaired in the mildly impaired range.  Visuo-spatial:  within normal limits  Speech (Volume):  normal  Speech:   normal;   Thought Process:  Coherent and Relevant  Though Content:  WNL;   Orientation:   person, place, time/date and situation  Judgment:   Good  Planning:   Good  Affect:    Anxious  Mood:    Depressed  Insight:   Good  Intelligence:   normal  Substance Use:  No concerns of substance abuse are reported.    Education:   Graduate  Medical History:   Past Medical History:  Diagnosis Date  . Hypertension         Risk of Suicide/Violence: virtually non-existent no indications of suicidal ideation or homicidal ideation.  Impression/DX:  Zachary [name] is [name] 46 year old right-handed male who has [name] history of obesity, hypertension. The patient presented on 03/18/2017 with severe dizziness, left-sided weakness and numbness. The patient received TPA. MRI showed acute infarction in the right paramedian inferior pons extending into the medulla. There were numerous areas of chronic hemorrhage throughout the brain. The patient has been coping with the residual effects of his right hemisphere CVA.  Due to adjustment issues and concerns about residual cognitive effects [name] neuropsychological consultation was requested.    The patient has been improving over the past couple of days but reports that Zachary [name] is continuing to have some anxiety and stress about his recovery process. The patient was administered the  Mini-Mental status examination as well as the Montral cognitive assessment. The patient's performance on both these measures was quite good. Zachary [name] did have some difficulty with recall of 3 objects but was able to identify and recall 2 of 3 objects after [name] period of delay. The patient's attention and concentration appeared to be adequate. His language functioning in this brief cognitive measure was also within normal limits.  The primary areas of deficits have to left side motor functions.    Diagnosis:    Right pontine cerebrovascular accident [facility]) - Plan: Ambulatory referral to Physical Medicine Rehab  Hyperlipidemia LDL goal <70 - Plan: atorvastatin (LIPITOR) tablet 20 mg         Electronically Signed   _______________________ Ilean Skill, Psy.D.

## 2017-03-30 ENCOUNTER — Inpatient Hospital Stay (HOSPITAL_COMMUNITY): Payer: Medicaid Other | Admitting: Occupational Therapy

## 2017-03-30 ENCOUNTER — Encounter (HOSPITAL_COMMUNITY): Payer: Self-pay

## 2017-03-30 ENCOUNTER — Inpatient Hospital Stay (HOSPITAL_COMMUNITY): Payer: Self-pay | Admitting: Physical Therapy

## 2017-03-30 ENCOUNTER — Inpatient Hospital Stay (HOSPITAL_COMMUNITY): Payer: Self-pay | Admitting: Occupational Therapy

## 2017-03-30 LAB — GLUCOSE, CAPILLARY
GLUCOSE-CAPILLARY: 117 mg/dL — AB (ref 65–99)
Glucose-Capillary: 111 mg/dL — ABNORMAL HIGH (ref 65–99)
Glucose-Capillary: 98 mg/dL (ref 65–99)
Glucose-Capillary: 109 mg/dL — ABNORMAL HIGH (ref 65–99)

## 2017-03-30 MED ORDER — MUSCLE RUB 10-15 % EX CREA
TOPICAL_CREAM | Freq: Three times a day (TID) | CUTANEOUS | Status: DC
  Administered 2017-03-30 – 2017-04-07 (×22): via TOPICAL
  Filled 2017-03-30 (×2): qty 85

## 2017-03-30 MED ORDER — TROLAMINE SALICYLATE 10 % EX CREA
TOPICAL_CREAM | Freq: Three times a day (TID) | CUTANEOUS | Status: DC
  Filled 2017-03-30: qty 85

## 2017-03-30 NOTE — Progress Notes (Signed)
[facility]ICINE & REHABILITATION     PROGRESS NOTE  Subjective/Complaints:  No new complaints. Feels that balance is getting better  ROS: pt denies nausea, vomiting, diarrhea, cough, shortness of breath or chest pain   Objective: Vital Signs: Blood pressure (!) 146/88, pulse 88, temperature 98.1 F (36.7 C), temperature source Oral, resp. rate 18, height 5\' 10"  (1.778 m), weight 102.1 kg (225 lb), SpO2 98 %. No results found. No results for input(s): WBC, HGB, HCT, PLT in the last 72 hours.  Recent Labs  03/28/17 0449  NA 134*  K 3.5  CL 100*  GLUCOSE 138*  BUN 30*  CREATININE 1.67*  CALCIUM 9.5   CBG (last 3)   Recent Labs  03/29/17 1629 03/29/17 2051 03/30/17 0624  GLUCAP 115* 102* 111*    Wt Readings from Last 3 Encounters:  03/23/17 102.1 kg (225 lb)  03/18/17 102.1 kg (225 lb)    Physical Exam:  BP (!) 146/88 (BP Location: Left Arm)   Pulse 88   Temp 98.1 F (36.7 C) (Oral)   Resp 18   Ht 5\' 10"  (1.778 m)   Wt 102.1 kg (225 lb)   SpO2 98%   BMI 32.28 kg/m  Constitutional: NAD. He appears well-developed and well-nourished.  HENT: Normocephalic. Atraumatic.  Eyes: EOMare normal. No discharge.  Cardiovascular: RRR without murmur. No JVD   . Respiratory: CTA Bilaterally without wheezes or rales. Normal effort   .  GI: Bowel sounds are normal. He exhibits no distension.  Neurological. Alert and oriented Mild facial droop Motor: RUE/RLE: 5/5 proximal to distal LUE: 4- to 4/5 proximal to distal. LLE: 4/5 Sensation 1+/2 Left arm and leg. Decreased FMC Skin. Warm and dry. Intact.  Psych: Normal mood and behavior.   Assessment/Plan: 1. Functional deficits secondary to right pontine small vessel infarct which require 3+ hours per day of interdisciplinary therapy in a comprehensive inpatient rehab setting. Physiatrist is providing close team supervision and 24 hour management of active medical problems listed below. Physiatrist and rehab  team continue to assess barriers to discharge/monitor patient progress toward functional and medical goals.  Function:  Bathing Bathing position   Position: Standing at sink  Bathing parts Body parts bathed by patient: Front perineal area, Buttocks, Right upper leg, Left upper leg Body parts bathed by helper: Back  Bathing assist Assist Level: Supervision or verbal cues      Upper Body Dressing/Undressing Upper body dressing   What is the patient wearing?: Pull over shirt/dress     Pull over shirt/dress - Perfomed by patient: Thread/unthread right sleeve, Thread/unthread left sleeve, Put head through opening, Pull shirt over trunk          Upper body assist Assist Level: Set up, Supervision or verbal cues   Set up : To obtain clothing/put away  Lower Body Dressing/Undressing Lower body dressing   What is the patient wearing?: Underwear, Pants, Socks, Shoes Underwear - Performed by patient: Thread/unthread right underwear leg, Thread/unthread left underwear leg Underwear - Performed by helper: Pull underwear up/down Pants- Performed by patient: Thread/unthread right pants leg, Thread/unthread left pants leg, Pull pants up/down       Socks - Performed by patient: Don/doff right sock, Don/doff left sock   Shoes - Performed by patient: Don/doff right shoe, Don/doff left shoe Shoes - Performed by helper: Fasten right, Fasten left          Lower body assist Assist for lower body dressing: Touching or steadying assistance (Pt >  75%)      Toileting Toileting Toileting activity did not occur: No continent bowel/bladder event Toileting steps completed by patient: Adjust clothing prior to toileting, Performs perineal hygiene, Adjust clothing after toileting Toileting steps completed by helper: Adjust clothing prior to toileting Toileting Assistive Devices: Grab bar or rail  Toileting assist Assist level: Touching or steadying assistance (Pt.75%)   Transfers Chair/bed  transfer   Chair/bed transfer method: Squat pivot Chair/bed transfer assist level: Touching or steadying assistance (Pt > 75%) Chair/bed transfer assistive device: Armrests     Locomotion Ambulation     Max distance: 150 ft  Assist level: Touching or steadying assistance (Pt > 75%)   Wheelchair   Type: Manual Max wheelchair distance: 122ft Assist Level: Supervision or verbal cues  Cognition Comprehension Comprehension assist level: Follows complex conversation/direction with no assist  Expression Expression assist level: Expresses complex ideas: With no assist  Social Interaction Social Interaction assist level: Interacts appropriately with others - No medications needed.  Problem Solving Problem solving assist level: Solves complex problems: Recognizes & self-corrects  Memory Memory assist level: Complete Independence: No helper    Medical Problem List and Plan: 1. Left hemiparesissecondary to right pontine small vessel infarct on 8/4  Cont CIR therapies 2. DVT Prophylaxis/Anticoagulation: SCDs. Monitor for any signs of DVT 3. Pain Management/headaches: Topamax 50 mg twice a day--controlled 4. Mood: Provide emotional support 5. Neuropsych: This patient iscapable of making decisions on hisown behalf. 6. Skin/Wound Care: Routine skin checks 7. Fluids/Electrolytes/Nutrition: Routine I&Os 8.Hypertension.   Currently maintained on HCTZ 25 mg daily, Norvasc 10 mg daily.   bordeline with improvement overall---consider addition of third agent if needed 9. Diabetes mellitus. Hemoglobin A1c 8.1. Glucophage 500 mg twice a day. Check blood sugars before meals and at bedtime. Diabetic teaching  Relatively controlled  10. Hyperlipidemia. Lipitor 11.Obesity. BMI 32.28 kg/m2 12. Hyponatremia  Na+ 134 on 8/14  Cont to monitor 13. AKI  Cr 1.67on 8/14  -recheck bmet tomorrow 14. Hypokalemia  K+ up to 3.5 8/14  -further supplementation added 15. Sleep disturbance  Trazodone 50  mg prn  LOS (Days) 7 A FACE TO FACE EVALUATION WAS PERFORMED  Purvis Sidle T 03/30/2017 9:01 AM

## 2017-03-30 NOTE — Progress Notes (Signed)
Physical Therapy Session Note  Patient Details  Name: Zachary [name] MRN: [id number] Date of Birth: Jul 26, 1971  Today's Date: 03/30/2017 PT Individual Time: 0900-1000 PT Individual Time Calculation (min): 60 min   Short Term Goals: Week 1:  PT Short Term Goal 1 (Week 1): Pt will perform bed to chair transfer with min assist. PT Short Term Goal 2 (Week 1): Pt will ambulate 5 ft wtih mod assist of 1 with LRAD. PT Short Term Goal 3 (Week 1): Pt will stand x 2 min without LOB with min assist while performing functional UE activity.  Skilled Therapeutic Interventions/Progress Updates: Pt received supine in bed, denies pain and agreeable to treatment. Supine>sit with S; dons socks and shoes on EOB with setupA. Stand pivot transfer bed <>w/c no AD and close S. W/c propulsion BLE to gym for strengthening and endurance. Utilized Karvonen method to determine max HR and target HR ranges for aerobic activity. Resting HR taken from AM vitals as pt fatigued following w/c propulsion. Resting HR 88 bpm; max HR 172 bpm, 60% max 138bpm 80% max 156 bpm. Three trials ambulating on treadmill with litegait. First trial: 5:05 and 669' average 1.7 mph, second trial 6:00 and 960' average 1.9 mph, third trial 4:58 and 803' average 2.0 mph. Each trial terminated when pt reporting LLE fatigue and therapist noting increased gait deviations including reduced L foot clearance, increased LLE scissoring/adduction, and L lateral trunk lean. HR assessed throughout trial with steady state average 140bpm with all trials. Gait speed increased per tolerance with attention to gait quality, with attention to increasing HR to desired target HR range. Gait to room with light L HHA and min guard overall, no AD; Mild instability noted with direction changes, however no major LOBs. Remained supine in bed at end of session, all needs in reach.      Therapy Documentation Precautions:  Precautions Precautions: Fall Precaution Comments:  (Left  hemiparesis) Restrictions Weight Bearing Restrictions: No   See Function Navigator for Current Functional Status.   Therapy/Group: Individual Therapy  Luberta Mutter 03/30/2017, 11:24 AM

## 2017-03-30 NOTE — Progress Notes (Signed)
Occupational Therapy Session Note  Patient Details  Name: Zachary [name] MRN: [id number] Date of Birth: 1971/04/30  Today's Date: 03/30/2017 OT Individual Time: 1003-1031 OT Individual Time Calculation (min): 28 min    Short Term Goals: Week 1:  OT Short Term Goal 1 (Week 1): Pt will complete shower transfer with Min A OT Short Term Goal 2 (Week 1): Pt will complete sit<stand for LB dressing with Min A OT Short Term Goal 3 (Week 1): Pt will complete 1/3 components of toileting tasks  Skilled Therapeutic Interventions/Progress Updates:    Pt received sitting up in w/c, ready for OT tx session. Pt self propelled w/c to rehab gym, completes stand pivot transfer to mat table using RW with close guard for safety. Pt completes seated table top activity with focus on LUE FMC. Pt engages in pipe tree activity, requires increased time to obtain pieces with L hand with intermittent drops. Educated Pt on L hand exercises to complete in sitting to engage full digit ROM and for practicing digit isolation with Pt return demonstrating each motion. Pt returns to w/c, returns to room in manner described above where Pt was left seated in w/c needs within reach.   Therapy Documentation Precautions:  Precautions Precautions: Fall Precaution Comments:  (Left hemiparesis) Restrictions Weight Bearing Restrictions: No   Pain: Pain Assessment Pain Assessment: Faces Faces Pain Scale: No hurt ADL: ADL ADL Comments: Please see functional navigator for ADL status    Therapy/Group: Individual Therapy  Raymondo Band 03/30/2017, 4:19 PM

## 2017-03-30 NOTE — Progress Notes (Signed)
Occupational Therapy Session Note  Patient Details  Name: Zachary [name] MRN: [id number] Date of Birth: 1971-05-26  Today's Date: 03/30/2017 OT Individual Time: 1100-1159 and 1500- 1600 OT Individual Time Calculation (min): 59 min and 60 min    Short Term Goals: Week 1:  OT Short Term Goal 1 (Week 1): Pt will complete shower transfer with Min A OT Short Term Goal 2 (Week 1): Pt will complete sit<stand for LB dressing with Min A OT Short Term Goal 3 (Week 1): Pt will complete 1/3 components of toileting tasks  Skilled Therapeutic Interventions/Progress Updates:    Session 1: Upon entering the room, pt seated in wheelchair awaiting therapist and requesting to shower. Pt ambulates to obtain clothing items with steady assistance and use of RW for balance. Pt seated on TTB for bathing at shower level with supervision for task. Pt standing to wash buttocks and peri with steady assistance and use of grab bar. Pt dressing from wheelchair level with use of hemiplegic techniques without cues to do so. Pt able to tie B shoe laces for the first time! Pt very excited over this accomplishment. Pt remaining in wheelchair at end of session with call bell and all needed items within reach.   Session 2: Upon entering the room, pt seated in wheelchair with no c/o pain this session. Pt agreeable to OT intervention. Pt ambulating with RW 100' to ADL apartment with steady assistance. OT provided pt with red, medium soft theraputty and paper handouts regarding strengthening and coordination exercises. Pt demonstrated exercises back with min verbal cues for proper technique. Pt needing multiple rest breaks secondary to fatigue. Pt with c/o L UE muscle soreness at end of session and PA ordering K Pad. Pt returning to wheelchair in room with call bell and all needed items within reach.   Therapy Documentation Precautions:  Precautions Precautions: Fall Precaution Comments:  (Left hemiparesis) Restrictions Weight  Bearing Restrictions: No General:   Vital Signs:  Pain:   ADL: ADL ADL Comments: Please see functional navigator for ADL status Vision   Perception    Praxis   Exercises:   Other Treatments:    See Function Navigator for Current Functional Status.   Therapy/Group: Individual Therapy  Gypsy Decant 03/30/2017, 12:51 PM

## 2017-03-30 NOTE — Plan of Care (Signed)
Problem: RH BOWEL ELIMINATION Goal: RH STG MANAGE BOWEL W/MEDICATION W/ASSISTANCE STG Manage Bowel with Medication with  Zachary [name].  Outcome: Progressing Last BM 03/28/17  Problem: RH SKIN INTEGRITY Goal: RH STG SKIN FREE OF INFECTION/BREAKDOWN No skin breakdown  Outcome: Progressing Skin dry and intact

## 2017-03-31 ENCOUNTER — Encounter (HOSPITAL_COMMUNITY): Payer: Self-pay

## 2017-03-31 ENCOUNTER — Inpatient Hospital Stay (HOSPITAL_COMMUNITY): Payer: Self-pay | Admitting: Occupational Therapy

## 2017-03-31 ENCOUNTER — Inpatient Hospital Stay (HOSPITAL_COMMUNITY): Payer: Self-pay

## 2017-03-31 ENCOUNTER — Inpatient Hospital Stay (HOSPITAL_COMMUNITY): Payer: Self-pay | Admitting: Physical Therapy

## 2017-03-31 LAB — BASIC METABOLIC PANEL
Anion gap: 10 (ref 5–15)
BUN: 29 mg/dL — ABNORMAL HIGH (ref 6–20)
CHLORIDE: 101 mmol/L (ref 101–111)
CO2: 26 mmol/L (ref 22–32)
Calcium: 9.5 mg/dL (ref 8.9–10.3)
Creatinine, Ser: 1.54 mg/dL — ABNORMAL HIGH (ref 0.61–1.24)
GFR calc non Af Amer: 52 mL/min — ABNORMAL LOW (ref >60)
GFR, EST AFRICAN AMERICAN: 60 mL/min — AB (ref >60)
Glucose, Bld: 113 mg/dL — ABNORMAL HIGH (ref 65–99)
POTASSIUM: 3.2 mmol/L — AB (ref 3.5–5.1)
SODIUM: 137 mmol/L (ref 135–145)

## 2017-03-31 LAB — GLUCOSE, CAPILLARY
GLUCOSE-CAPILLARY: 115 mg/dL — AB (ref 65–99)
GLUCOSE-CAPILLARY: 88 mg/dL (ref 65–99)
GLUCOSE-CAPILLARY: 118 mg/dL — AB (ref 65–99)
GLUCOSE-CAPILLARY: 107 mg/dL — AB (ref 65–99)

## 2017-03-31 MED ORDER — POTASSIUM CHLORIDE CRYS ER 20 MEQ PO TBCR
40.0000 meq | EXTENDED_RELEASE_TABLET | Freq: Two times a day (BID) | ORAL | Status: DC
  Administered 2017-03-31 – 2017-04-07 (×14): 40 meq via ORAL
  Filled 2017-03-31 (×14): qty 2

## 2017-03-31 NOTE — Progress Notes (Signed)
Physical Therapy Weekly Progress Note  Patient Details  Name: Zachary [name] MRN: [id number] Date of Birth: 1971/08/12  Beginning of progress report period: March 24, 2017 End of progress report period: March 31, 2017  Today's Date: 03/31/2017 PT Individual Time: 1000-1100 PT Individual Time Calculation (min): 60 min   Patient has met 3 of 3 short term goals.  Pt progressing well beyond initial short term goals. Currently requiring supervision bed mobility, transfers and gait with RW. Working on balance without UE support and walking without RW. Currently requires min/mod assist with occasional LOB.   Patient continues to demonstrate the following deficits muscle weakness, ataxia and decreased coordination and decreased standing balance, hemiplegia and decreased balance strategies and therefore will continue to benefit from skilled PT intervention to increase functional independence with mobility.  Patient progressing toward long term goals..  Continue plan of care.  PT Short Term Goals Week 1:  PT Short Term Goal 1 (Week 1): Pt will perform bed to chair transfer with min assist. PT Short Term Goal 1 - Progress (Week 1): Met PT Short Term Goal 2 (Week 1): Pt will ambulate 5 ft wtih mod assist of 1 with LRAD. PT Short Term Goal 2 - Progress (Week 1): Met PT Short Term Goal 3 (Week 1): Pt will stand x 2 min without LOB with min assist while performing functional UE activity. PT Short Term Goal 3 - Progress (Week 1): Met Week 2:  PT Short Term Goal 1 (Week 2): = LTG due to ELOS  Skilled Therapeutic Interventions/Progress Updates:    Pt received sitting up in recliner, agreeable to therapy. Pt walked 150 ft to gym with supervision and RW. Pt performed dynamic standing balance with narrow BOS and staggered stance, moving ball around trunk and head. Task incorporated ROM and grasping activities for LUE. Progressed to narrow BOS and staggered stance on foam with ball movements. Pt performed  obstacle course with weaving, turns, and stepping over obstacles with min assist and no AD. Pt performed lateral walking of 15 ft x 4 with ball toss with min assist to address hip strengthening, single leg balance, APR, and coordination. Progressed to lateral walking 75 ft x 2 with basketball toss/bounce pass with min assist. Pt with on LOB, requiring max assist for recover. Pt ambulated 150 ft back to room with min assist and no AD. Pt left sitting up in recliner, all needs in reach.   Therapy Documentation Precautions:  Precautions Precautions: Fall Precaution Comments:  (Left hemiparesis) Restrictions Weight Bearing Restrictions: No   See Function Navigator for Current Functional Status.  Therapy/Group: Individual Therapy  Gwinda Passe, SPT 03/31/2017, 2:33 PM

## 2017-03-31 NOTE — Progress Notes (Signed)
Occupational Therapy Session Note  Patient Details  Name: Zachary [name] MRN: [id number] Date of Birth: 08-27-1970  Today's Date: 03/31/2017 OT Individual Time: 4098-1191 OT Individual Time Calculation (min): 56 min    Skilled Therapeutic Interventions/Progress Updates:    1;1. Focus of session on ADL retraining, funcitnoal use of LUE and ambuation with AD. Pt ambulates with RW into bathroom with supervision and Vc for RW managemt as pt transfers onto TTB. Pt bathes at sit to stand level with supervision with 1 VC to incorporate LUE into bathing tasks. Pt dresses seated EOB with min question cues for hemi techniques. Pt stands at sink with supervision to brush teeth. Pt ambulates to day room with superviison and tactile cues to relax shoulders to sit at table for table top activity focusing on [facility] of LUE using scrabble pieces for palm to finger/finger to palm trnaslocation and shift dropping tiles 6/10 times. Exited session with pt seated in recliner with call light in reach and all needs met.  Therapy Documentation Precautions:  Precautions Precautions: Fall Precaution Comments:  (Left hemiparesis) Restrictions Weight Bearing Restrictions: No  See Function Navigator for Current Functional Status.   Therapy/Group: Individual Therapy  Tonny Branch 03/31/2017, 7:30 AM

## 2017-03-31 NOTE — Plan of Care (Signed)
Problem: RH BOWEL ELIMINATION Goal: RH STG MANAGE BOWEL W/MEDICATION W/ASSISTANCE STG Manage Bowel with Medication with  [place].  Outcome: Not Progressing Last BM 03/28/2017, patient refused Sorbitol and was educated with verbalization of understanding  Problem: RH SKIN INTEGRITY Goal: RH STG SKIN FREE OF INFECTION/BREAKDOWN No skin breakdown  Outcome: Progressing No skin issues noted

## 2017-03-31 NOTE — Progress Notes (Signed)
[facility] PHYSICAL MEDICINE & REHABILITATION     PROGRESS NOTE  Subjective/Complaints:  Up in chair. Feels well. Slept without issue  ROS: pt denies nausea, vomiting, diarrhea, cough, shortness of breath or chest pain   Objective: Vital Signs: Blood pressure (!) 144/90, pulse 78, temperature 98.5 F (36.9 C), temperature source Oral, resp. rate 17, height 5\' 10"  (1.778 m), weight 102.1 kg (225 lb), SpO2 100 %. No results found. No results for input(s): WBC, HGB, HCT, PLT in the last 72 hours.  Recent Labs  03/31/17 0532  NA 137  K 3.2*  CL 101  GLUCOSE 113*  BUN 29*  CREATININE 1.54*  CALCIUM 9.5   CBG (last 3)   Recent Labs  03/30/17 1700 03/30/17 2052 03/31/17 0651  GLUCAP 117* 109* 115*    Wt Readings from Last 3 Encounters:  03/23/17 102.1 kg (225 lb)  03/18/17 102.1 kg (225 lb)    Physical Exam:  BP (!) 144/90 (BP Location: Right Arm)   Pulse 78   Temp 98.5 F (36.9 C) (Oral)   Resp 17   Ht 5\' 10"  (1.778 m)   Wt 102.1 kg (225 lb)   SpO2 100%   BMI 32.28 kg/m  Constitutional: NAD. He appears well-developed and well-nourished.  HENT: Normocephalic. Atraumatic.  Eyes: EOMare normal. No discharge.  Cardiovascular: RRR without murmur. No JVD    . Respiratory: CTA Bilaterally without wheezes or rales. Normal effort    .  GI: Bowel sounds are normal. He exhibits no distension.  Neurological. Alert and oriented Mild facial droop Motor: RUE/RLE: 5/5 proximal to distal LUE: 4- to 4/5 proximal to distal. LLE: 4/5---stable Sensation 1+/2 Left arm and leg. Decreased FMC Skin. Warm and dry. Intact.  Psych: Normal mood and behavior.   Assessment/Plan: 1. Functional deficits secondary to right pontine small vessel infarct which require 3+ hours per day of interdisciplinary therapy in a comprehensive inpatient rehab setting. Physiatrist is providing close team supervision and 24 hour management of active medical problems listed below. Physiatrist and rehab  team continue to assess barriers to discharge/monitor patient progress toward functional and medical goals.  Function:  Bathing Bathing position   Position: Shower  Bathing parts Body parts bathed by patient: Front perineal area, Buttocks, Right upper leg, Left upper leg, Right arm, Left arm, Chest, Abdomen, Right lower leg, Left lower leg Body parts bathed by helper: Back  Bathing assist Assist Level: Supervision or verbal cues      Upper Body Dressing/Undressing Upper body dressing   What is the patient wearing?: Pull over shirt/dress     Pull over shirt/dress - Perfomed by patient: Thread/unthread right sleeve, Thread/unthread left sleeve, Put head through opening, Pull shirt over trunk          Upper body assist Assist Level: Supervision or verbal cues   Set up : To obtain clothing/put away  Lower Body Dressing/Undressing Lower body dressing   What is the patient wearing?: Underwear, Pants, Socks, Shoes Underwear - Performed by patient: Thread/unthread right underwear leg, Thread/unthread left underwear leg, Pull underwear up/down Underwear - Performed by helper: Pull underwear up/down Pants- Performed by patient: Thread/unthread right pants leg, Thread/unthread left pants leg, Pull pants up/down       Socks - Performed by patient: Don/doff right sock, Don/doff left sock   Shoes - Performed by patient: Don/doff right shoe, Don/doff left shoe, Fasten right, Fasten left Shoes - Performed by helper: Fasten right, Fasten left  Lower body assist Assist for lower body dressing: Touching or steadying assistance (Pt > 75%)      Toileting Toileting Toileting activity did not occur: No continent bowel/bladder event Toileting steps completed by patient: Adjust clothing prior to toileting, Performs perineal hygiene, Adjust clothing after toileting Toileting steps completed by helper: Adjust clothing prior to toileting Toileting Assistive Devices: Grab bar or rail   Toileting assist Assist level: Touching or steadying assistance (Pt.75%)   Transfers Chair/bed transfer   Chair/bed transfer method: Ambulatory Chair/bed transfer assist level: Touching or steadying assistance (Pt > 75%) Chair/bed transfer assistive device: Armrests, Medical sales representative     Max distance: 175 Assist level: Touching or steadying assistance (Pt > 75%)   Wheelchair   Type: Manual Max wheelchair distance: 12ft Assist Level: Supervision or verbal cues  Cognition Comprehension Comprehension assist level: Follows complex conversation/direction with no assist  Expression Expression assist level: Expresses complex ideas: With no assist  Social Interaction Social Interaction assist level: Interacts appropriately with others - No medications needed.  Problem Solving Problem solving assist level: Solves complex problems: Recognizes & self-corrects  Memory Memory assist level: Complete Independence: No helper    Medical Problem List and Plan: 1. Left hemiparesissecondary to right pontine small vessel infarct on 8/4  Cont CIR therapies  -hopeful to avoid w/c 2. DVT Prophylaxis/Anticoagulation: SCDs. Monitor for any signs of DVT 3. Pain Management/headaches: Topamax 50 mg twice a day--controlled 4. Mood: Provide emotional support 5. Neuropsych: This patient iscapable of making decisions on hisown behalf. 6. Skin/Wound Care: Routine skin checks 7. Fluids/Electrolytes/Nutrition: Routine I&Os 8.Hypertension.   Currently maintained on HCTZ 25 mg daily, Norvasc 10 mg daily.   improvement overall---consider addition of third agent if needed 9. Diabetes mellitus. Hemoglobin A1c 8.1. Glucophage 500 mg twice a day. Check blood sugars before meals and at bedtime. Diabetic teaching  Relatively controlled  10. Hyperlipidemia. Lipitor 11.Obesity. BMI 32.28 kg/m2 12. Hyponatremia  Na+ 137 on 8/17  Cont to monitor 13. AKI  Cr 1.54on 8/17  -  14.  Hypokalemia  K+ up to 3.2 8/17  -further supplementation added today 15. Sleep disturbance  Trazodone 50 mg prn  LOS (Days) 8 A FACE TO FACE EVALUATION WAS PERFORMED  Ivyrose Hashman T 03/31/2017 9:04 AM

## 2017-03-31 NOTE — Progress Notes (Signed)
Occupational Therapy Session Note  Patient Details  Name: Zachary [name] MRN: [id number] Date of Birth: [birth year]  Today's Date: 03/31/2017 OT Individual Time: 1300-1417 OT Individual Time Calculation (min): 77 min    Short Term Goals: Week 1:  OT Short Term Goal 1 (Week 1): Pt will complete shower transfer with Min A OT Short Term Goal 2 (Week 1): Pt will complete sit<stand for LB dressing with Min A OT Short Term Goal 3 (Week 1): Pt will complete 1/3 components of toileting tasks  Skilled Therapeutic Interventions/Progress Updates:    Tx focus on Lt NMR, functional mobility with and without device, balance, and endurance during functional tasks.   Pt greeted in recliner, agreeable to go outside. He ambulated from room to [facility] hallway approx 150 ft with RW and Min A. Pt escorted remainder of way due to fatigue/time mgt. Once outdoors, pt ambulated to table and sat in attached chair (low and without armrests). Pt engaging in [facility] tabletop activities including tying shoe laces and fastening small buttons with extra time. Pt instructed on shoulder stretches to relieve fatigue/soreness during rest breaks. Pt ambulated in community environment without AD, navigating on uneven surfaces and up/down small inclines. He required cues for weightshifting to Rt side and clearing L LE completely. Pt then transferred to community bench for a rest break. Discussed stress mgt at home as pt believes this contributed to his CVA. Pt reports he plans to be careful at home, delegate his usual IADL roles, while helping out as much as he could to maintain his functional level. Pt was then escorted back into building. He self propelled 100 ft up ramp of [employer] with bilateral UEs, able to grasp/release left wheel with mod cues and visual attention to L UE. Pt was escorted remainder of way back to room. He transferred to recliner and was left with all needs within reach.   Applied muscle relief ointment per RN  consent to manage L UE soreness prior to session exit.   Therapy Documentation Precautions:  Precautions Precautions: Fall Precaution Comments:  (Left hemiparesis) Restrictions Weight Bearing Restrictions: No Vital Signs: Therapy Vitals Temp: 98.5 F (36.9 C) Temp Source: Oral Pulse Rate: 78 Resp: 17 BP: (!) 144/90 Patient Position (if appropriate): Lying Oxygen Therapy SpO2: 100 % O2 Device: Not Delivered ADL: ADL ADL Comments: Please see functional navigator for ADL status     See Function Navigator for Current Functional Status.   Therapy/Group: Individual Therapy  [name] 03/31/2017, 7:40 AM

## 2017-04-01 ENCOUNTER — Inpatient Hospital Stay (HOSPITAL_COMMUNITY): Payer: Self-pay | Admitting: Occupational Therapy

## 2017-04-01 LAB — GLUCOSE, CAPILLARY
GLUCOSE-CAPILLARY: 106 mg/dL — AB (ref 65–99)
GLUCOSE-CAPILLARY: 128 mg/dL — AB (ref 65–99)
Glucose-Capillary: 112 mg/dL — ABNORMAL HIGH (ref 65–99)
Glucose-Capillary: 66 mg/dL (ref 65–99)
Glucose-Capillary: 106 mg/dL — ABNORMAL HIGH (ref 65–99)

## 2017-04-01 NOTE — Progress Notes (Signed)
[facility] PHYSICAL MEDICINE & REHABILITATION     PROGRESS NOTE  Subjective/Complaints:  No new complaints  ROS: pt denies nausea, vomiting, diarrhea, cough, shortness of breath or chest pain   Objective: Vital Signs: Blood pressure (!) 132/93, pulse 84, temperature 97.7 F (36.5 C), temperature source Oral, resp. rate 16, height 5\' 10"  (1.778 m), weight 102.1 kg (225 lb), SpO2 99 %. No results found. No results for input(s): WBC, HGB, HCT, PLT in the last 72 hours.  Recent Labs  03/31/17 0532  NA 137  K 3.2*  CL 101  GLUCOSE 113*  BUN 29*  CREATININE 1.54*  CALCIUM 9.5   CBG (last 3)   Recent Labs  03/31/17 1640 03/31/17 2105 04/01/17 0628  GLUCAP 118* 107* 112*    Wt Readings from Last 3 Encounters:  03/23/17 102.1 kg (225 lb)  03/18/17 102.1 kg (225 lb)    Physical Exam:  BP (!) 132/93 (BP Location: Right Arm)   Pulse 84   Temp 97.7 F (36.5 C) (Oral)   Resp 16   Ht 5\' 10"  (1.778 m)   Wt 102.1 kg (225 lb)   SpO2 99%   BMI 32.28 kg/m  Constitutional: NAD. He appears well-developed and well-nourished.  HENT: Normocephalic. Atraumatic.  Eyes: EOMare normal. No discharge.  Cardiovascular: RRR without murmur. No JVD     . Respiratory: CTA Bilaterally without wheezes or rales. Normal effort    .  GI: Bowel sounds are normal. He exhibits no distension.  Neurological. Alert and oriented Mild facial droop Motor: RUE/RLE: 5/5 proximal to distal LUE: 4- to 4/5 proximal to distal. LLE: 4/5---stable Sensation 1+/2 Left arm and leg. Decreased FMC Skin. Warm and dry. Intact.  Psych: Normal mood and behavior.   Assessment/Plan: 1. Functional deficits secondary to right pontine small vessel infarct which require 3+ hours per day of interdisciplinary therapy in a comprehensive inpatient rehab setting. Physiatrist is providing close team supervision and 24 hour management of active medical problems listed below. Physiatrist and rehab team continue to assess  barriers to discharge/monitor patient progress toward functional and medical goals.  Function:  Bathing Bathing position   Position: Shower  Bathing parts Body parts bathed by patient: Front perineal area, Buttocks, Right upper leg, Left upper leg, Right arm, Left arm, Chest, Abdomen, Right lower leg, Left lower leg Body parts bathed by helper: Back  Bathing assist Assist Level: Supervision or verbal cues      Upper Body Dressing/Undressing Upper body dressing   What is the patient wearing?: Pull over shirt/dress     Pull over shirt/dress - Perfomed by patient: Thread/unthread right sleeve, Thread/unthread left sleeve, Put head through opening, Pull shirt over trunk          Upper body assist Assist Level: Supervision or verbal cues   Set up : To obtain clothing/put away  Lower Body Dressing/Undressing Lower body dressing   What is the patient wearing?: Underwear, Pants, Socks, Shoes Underwear - Performed by patient: Thread/unthread right underwear leg, Thread/unthread left underwear leg, Pull underwear up/down Underwear - Performed by helper: Pull underwear up/down Pants- Performed by patient: Thread/unthread right pants leg, Thread/unthread left pants leg, Pull pants up/down       Socks - Performed by patient: Don/doff right sock, Don/doff left sock   Shoes - Performed by patient: Don/doff right shoe, Don/doff left shoe, Fasten right, Fasten left Shoes - Performed by helper: Fasten right, Fasten left          Lower body  assist Assist for lower body dressing: Touching or steadying assistance (Pt > 75%)      Toileting Toileting Toileting activity did not occur: No continent bowel/bladder event Toileting steps completed by patient: Adjust clothing prior to toileting, Performs perineal hygiene, Adjust clothing after toileting Toileting steps completed by helper: Adjust clothing prior to toileting [place]: Grab bar or rail  Toileting assist Assist  level: Touching or steadying assistance (Pt.75%)   Transfers Chair/bed transfer   Chair/bed transfer method: Ambulatory Chair/bed transfer assist level: Touching or steadying assistance (Pt > 75%) Chair/bed transfer assistive device: Armrests, Medical sales representative     Max distance: 150 Assist level: Touching or steadying assistance (Pt > 75%)   Wheelchair   Type: Manual Max wheelchair distance: 188ft Assist Level: Supervision or verbal cues  Cognition Comprehension Comprehension assist level: Follows complex conversation/direction with no assist  Expression Expression assist level: Expresses complex ideas: With no assist  Social Interaction Social Interaction assist level: Interacts appropriately with others - No medications needed.  Problem Solving Problem solving assist level: Solves complex problems: Recognizes & self-corrects  Memory Memory assist level: Complete Independence: No helper    Medical Problem List and Plan: 1. Left hemiparesissecondary to right pontine small vessel infarct on 8/4  Cont CIR therapies  -making gradual progress 2. DVT Prophylaxis/Anticoagulation: SCDs. Monitor for any signs of DVT 3. Pain Management/headaches: Topamax 50 mg twice a day--controlled 4. Mood: Provide emotional support 5. Neuropsych: This patient iscapable of making decisions on hisown behalf. 6. Skin/Wound Care: Routine skin checks 7. Fluids/Electrolytes/Nutrition: Routine I&Os 8.Hypertension.   Currently maintained on HCTZ 25 mg daily, Norvasc 10 mg daily.   improvement overall---consider addition of third agent if needed 9. Diabetes mellitus. Hemoglobin A1c 8.1. Glucophage 500 mg twice a day. Check blood sugars before meals and at bedtime. Diabetic teaching  Relatively controlled  10. Hyperlipidemia. Lipitor 11.Obesity. BMI 32.28 kg/m2 12. Hyponatremia  Na+ 137 on 8/17  Cont to monitor 13. AKI  Cr 1.54on 8/17  -  14. Hypokalemia  K+ up to 3.2  8/17  -further supplementation added today 15. Sleep disturbance  Trazodone 50 mg prn  LOS (Days) 9 A FACE TO FACE EVALUATION WAS PERFORMED  SWARTZ,ZACHARY T 04/01/2017 9:21 AM

## 2017-04-01 NOTE — Progress Notes (Addendum)
Occupational Therapy Session Note  Patient Details  Name: Zachary [name] MRN: [id number] Date of Birth: [birth year]  Today's Date: 04/01/2017 OT Individual Time: 9798-9211 OT Individual Time Calculation (min): 60 min   Skilled Therapeutic Interventions/Progress Updates:   Focus of session was on bilateral UE use (including L UE, which patient stated had decreased coordination in hand and decreased strength for shoulder flexion and abduction), dynamic standing balance, L LE coordination for self care and functional mobility, and safe mobility for functional ambulation when turning to patient's hemiparesis left side.  Patient participated in room shower via grab bars, long handled hose, tub transfer bench (completed lateral leans while seated on tub bench due to modesty during shower), batheing and dressing with overall distant supervision.  Patient when making sharp left turns during functional mobility, patient exhibited good safety judgemental and balance as partially evidenced by no loss of balance during and by stating, "I know to not make too quick of turns to my left because this leg [left] is a little tricky, and I do not want to take a chance on trippping or falling."  Patient was left seated in recliner with call bell and phone nearby.    Therapy Documentation Precautions:  Precautions Precautions: Fall Precaution Comments:  (Left hemiparesis) Restrictions Weight Bearing Restrictions: No  Pain:denied     See Function Navigator for Current Functional Status.   Therapy/Group: Individual Therapy  Alfredia Ferguson [facility] 04/01/2017, 12:48 PM

## 2017-04-01 NOTE — Progress Notes (Signed)
Occupational Therapy Session Note  Patient Details  Name: Zachary [name] MRN: [id number] Date of Birth: 07-02-1971  Today's Date: 04/01/2017 OT Individual Time: 0630-1601 OT Individual Time Calculation (min): 45 min    Short Term Goals: Week 1:  OT Short Term Goal 1 (Week 1): Pt will complete shower transfer with Min A OT Short Term Goal 2 (Week 1): Pt will complete sit<stand for LB dressing with Min A OT Short Term Goal 3 (Week 1): Pt will complete 1/3 components of toileting tasks    Skilled Therapeutic Interventions/Progress Updates:    Tx focus on functional ambulation with and without device, endurance, and Lt NMR during functional tasks.   Pt greeted in recliner, ready for tx. He ambulated from room to dayroom with RW and supervision. In dayroom, he engaged in [place] task of puzzle completion while standing and seated as tolerated. Pt lifting pieces from table with Lt hand >75% of time (instead of sliding pieces). Exhibiting pincer grasp, and adequate in hand manipulation skills to orient pieces correctly with assist from Rt hand. Min A from OT for initial organization/sequencing. Pt appeared very engaged in activity throughout "it's a good mind challenge." At end of tx pt ambulated back to room with Min A (no AD) and was left with all needs within reach.   Therapy Documentation Precautions:  Precautions Precautions: Fall Precaution Comments:  (Left hemiparesis) Restrictions Weight Bearing Restrictions: No ]Pain: No c/o pain during tx    ADL: ADL ADL Comments: Please see functional navigator for ADL status     See Function Navigator for Current Functional Status.   Therapy/Group: Individual Therapy  Shauntel Prest A Lean Fayson 04/01/2017, 4:46 PM

## 2017-04-02 LAB — GLUCOSE, CAPILLARY
GLUCOSE-CAPILLARY: 109 mg/dL — AB (ref 65–99)
GLUCOSE-CAPILLARY: 110 mg/dL — AB (ref 65–99)
GLUCOSE-CAPILLARY: 108 mg/dL — AB (ref 65–99)
GLUCOSE-CAPILLARY: 76 mg/dL (ref 65–99)

## 2017-04-02 NOTE — Progress Notes (Signed)
[facility] PHYSICAL MEDICINE & REHABILITATION     PROGRESS NOTE  Subjective/Complaints:  No new complaints. Pleased with progress  ROS: pt denies nausea, vomiting, diarrhea, cough, shortness of breath or chest pain   Objective: Vital Signs: Blood pressure (!) 146/86, pulse 79, temperature 98.6 F (37 C), temperature source Oral, resp. rate (!) 22, height 5\' 10"  (1.778 m), weight 102.1 kg (225 lb), SpO2 98 %. No results found. No results for input(s): WBC, HGB, HCT, PLT in the last 72 hours.  Recent Labs  03/31/17 0532  NA 137  K 3.2*  CL 101  GLUCOSE 113*  BUN 29*  CREATININE 1.54*  CALCIUM 9.5   CBG (last 3)   Recent Labs  04/01/17 1643 04/01/17 2138 04/02/17 0551  GLUCAP 128* 106* 109*    Wt Readings from Last 3 Encounters:  03/23/17 102.1 kg (225 lb)  03/18/17 102.1 kg (225 lb)    Physical Exam:  BP (!) 146/86 (BP Location: Right Arm)   Pulse 79   Temp 98.6 F (37 C) (Oral)   Resp (!) 22   Ht 5\' 10"  (1.778 m)   Wt 102.1 kg (225 lb)   SpO2 98%   BMI 32.28 kg/m  Constitutional: NAD. He appears well-developed and well-nourished.  HENT: Normocephalic. Atraumatic.  Eyes: EOMare normal. No discharge.  Cardiovascular: RRR without murmur. No JVD      . Respiratory: CTA Bilaterally without wheezes or rales. Normal effort    .  GI: Bowel sounds are normal. He exhibits no distension.  Neurological. Alert and oriented Mild facial droop Motor: RUE/RLE: 5/5 proximal to distal LUE: 4- to 4/5 proximal to distal. LLE: 4/5---stable Sensation 1+/2 Left arm and leg. Decreased FMC Skin. Warm and dry. Intact.  Psych: Normal mood and behavior.   Assessment/Plan: 1. Functional deficits secondary to right pontine small vessel infarct which require 3+ hours per day of interdisciplinary therapy in a comprehensive inpatient rehab setting. Physiatrist is providing close team supervision and 24 hour management of active medical problems listed below. Physiatrist and rehab  team continue to assess barriers to discharge/monitor patient progress toward functional and medical goals.  Function:  Bathing Bathing position   Position: Shower  Bathing parts Body parts bathed by patient: Front perineal area, Buttocks, Right upper leg, Left upper leg, Right arm, Left arm, Chest, Abdomen, Right lower leg, Left lower leg Body parts bathed by helper: Back  Bathing assist Assist Level: Supervision or verbal cues      Upper Body Dressing/Undressing Upper body dressing   What is the patient wearing?: Pull over shirt/dress     Pull over shirt/dress - Perfomed by patient: Thread/unthread right sleeve, Thread/unthread left sleeve, Put head through opening, Pull shirt over trunk          Upper body assist Assist Level: Supervision or verbal cues   Set up : To obtain clothing/put away  Lower Body Dressing/Undressing Lower body dressing   What is the patient wearing?: Underwear, Pants, Socks, Shoes Underwear - Performed by patient: Thread/unthread right underwear leg, Thread/unthread left underwear leg, Pull underwear up/down Underwear - Performed by helper: Pull underwear up/down Pants- Performed by patient: Thread/unthread right pants leg, Thread/unthread left pants leg, Pull pants up/down       Socks - Performed by patient: Don/doff right sock, Don/doff left sock   Shoes - Performed by patient: Don/doff right shoe, Don/doff left shoe, Fasten right, Fasten left Shoes - Performed by helper: Fasten right, Fasten left  Lower body assist Assist for lower body dressing: Touching or steadying assistance (Pt > 75%)      Toileting Toileting Toileting activity did not occur: No continent bowel/bladder event Toileting steps completed by patient: Adjust clothing prior to toileting, Performs perineal hygiene, Adjust clothing after toileting Toileting steps completed by helper: Adjust clothing prior to toileting Toileting Assistive Devices: Grab bar or rail   Toileting assist Assist level: Touching or steadying assistance (Pt.75%)   Transfers Chair/bed transfer   Chair/bed transfer method: Ambulatory Chair/bed transfer assist level: Touching or steadying assistance (Pt > 75%) Chair/bed transfer assistive device: Armrests, Medical sales representative     Max distance: 150 Assist level: Touching or steadying assistance (Pt > 75%)   Wheelchair   Type: Manual Max wheelchair distance: 124ft Assist Level: Supervision or verbal cues  Cognition Comprehension Comprehension assist level: Follows complex conversation/direction with no assist  Expression Expression assist level: Expresses complex ideas: With no assist  Social Interaction Social Interaction assist level: Interacts appropriately with others - No medications needed.  Problem Solving Problem solving assist level: Solves complex problems: Recognizes & self-corrects  Memory Memory assist level: Complete Independence: No helper    Medical Problem List and Plan: 1. Left hemiparesissecondary to right pontine small vessel infarct on 8/4  Cont CIR therapies    2. DVT Prophylaxis/Anticoagulation: SCDs. Monitor for any signs of DVT 3. Pain Management/headaches: Topamax 50 mg twice a day--controlled 4. Mood: Provide emotional support 5. Neuropsych: This patient iscapable of making decisions on hisown behalf. 6. Skin/Wound Care: Routine skin checks 7. Fluids/Electrolytes/Nutrition: Routine I&Os 8.Hypertension.   Currently maintained on HCTZ 25 mg daily, Norvasc 10 mg daily.   improvement overall--  9. Diabetes mellitus. Hemoglobin A1c 8.1. Glucophage 500 mg twice a day. Check blood sugars before meals and at bedtime. Diabetic teaching  Relatively controlled  10. Hyperlipidemia. Lipitor 11.Obesity. BMI 32.28 kg/m2 12. Hyponatremia  Na+ 137 on 8/17  Cont to monitor 13. AKI  Cr 1.54on 8/17  -  14. Hypokalemia  K+ up to 3.2 8/17  -further supplementation added  today 15. Sleep disturbance  Trazodone 50 mg prn  LOS (Days) 10 A FACE TO FACE EVALUATION WAS PERFORMED  SWARTZ,ZACHARY T 04/02/2017 9:49 AM

## 2017-04-03 ENCOUNTER — Inpatient Hospital Stay (HOSPITAL_COMMUNITY): Payer: Self-pay | Admitting: Occupational Therapy

## 2017-04-03 ENCOUNTER — Inpatient Hospital Stay (HOSPITAL_COMMUNITY): Payer: Medicaid Other | Admitting: Physical Therapy

## 2017-04-03 LAB — GLUCOSE, CAPILLARY
GLUCOSE-CAPILLARY: 99 mg/dL (ref 65–99)
Glucose-Capillary: 88 mg/dL (ref 65–99)
Glucose-Capillary: 104 mg/dL — ABNORMAL HIGH (ref 65–99)
Glucose-Capillary: 103 mg/dL — ABNORMAL HIGH (ref 65–99)

## 2017-04-03 LAB — BASIC METABOLIC PANEL
ANION GAP: 10 (ref 5–15)
BUN: 26 mg/dL — AB (ref 6–20)
CHLORIDE: 103 mmol/L (ref 101–111)
CO2: 24 mmol/L (ref 22–32)
CREATININE: 1.45 mg/dL — AB (ref 0.61–1.24)
Calcium: 9.2 mg/dL (ref 8.9–10.3)
GFR calc non Af Amer: 56 mL/min — ABNORMAL LOW (ref >60)
Glucose, Bld: 98 mg/dL (ref 65–99)
POTASSIUM: 3.4 mmol/L — AB (ref 3.5–5.1)
SODIUM: 137 mmol/L (ref 135–145)

## 2017-04-03 NOTE — Progress Notes (Signed)
Occupational Therapy Weekly Progress Note  Patient Details  Name: Zachary [name] MRN: [id number] Date of Birth: [birth year]  Beginning of progress report period: 03/24/17 End of progress report period: 04/03/17  Today's Date: 04/03/2017 OT Individual Time: 3704-8889 and 1694-5038 OT Individual Time Calculation (min): 56 min and 28 min  Patient has met 3 of 3 short term goals.    Pt has made steady progress at time of report. He has gone from requiring overall Mod A for ADL tasks and functional squat pivot transfers, to now completing these tasks at [place] A-supervision level, ambulating with and without use of RW. He is gaining more functional return with L UE, actively integrating it into ADL tasks. Still working on [employer] and pt is very motivated during tx to work on his affected arm. Will continue to work on balance, Lt NMR, and d/c planning for OT goal achievement. D/c is this week.    Patient continues to demonstrate the following deficits: muscle weakness, decreased cardiorespiratoy endurance, impaired timing and sequencing, unbalanced muscle activation and decreased coordination and decreased standing balance, decreased postural control, hemiplegia and decreased balance strategies and therefore will continue to benefit from skilled OT intervention to enhance overall performance with BADL.  Patient progressing toward long term goals..  Continue plan of care.  OT Short Term Goals Week 1:  OT Short Term Goal 1 (Week 1): Pt will complete shower transfer with Min A OT Short Term Goal 1 - Progress (Week 1): Met OT Short Term Goal 2 (Week 1): Pt will complete sit<stand for LB dressing with Min A OT Short Term Goal 2 - Progress (Week 1): Met OT Short Term Goal 3 (Week 1): Pt will complete 1/3 components of toileting tasks OT Short Term Goal 3 - Progress (Week 1): Met Week 2:  OT Short Term Goal 1 (Week 2): STGs=LTGs due to ELOS  Skilled Therapeutic Interventions/Progress Updates:     Tx focus on Lt NMR, functional ambulation without device, endurance and ADL retraining.   Pt greeted in w/c, requesting shower. He ambulated with Min A to TTB. He bathed while seated and standing as needed with grab bars. Cued to use Lt hand for opening/closing containers and managing hand held shower hose (which he dropped x2. Required bilateral UE involvement to rinse head/back). Able to thoroughly wash Rt arm with Lt and manipulate wash cloth with extra time. Afterwards he ambulated back into room and retrieved clothing items from bedside chair (Min A). Pt dressed with overall steady assist, with extra time/cues for elevating pants over hips with bilateral UEs. Supervision for footwear, including tying laces. For remainder of session, had him engage in simulated medication mgt task. Utilizing Lt pincer grasp to retrieve small beads from container and dispense them into medication container. Pt also able to open/close medication container with cues to use Rt hand for obtaining visual feedback on FM technique (pincer grasp with narrow fingers vs. circular formation). Pt challenged by task, but able to deposit 5 beads with extra time. Afterwards pt completed stand step transfer to recliner with close supervision. He was left with all needs within reach at time of departure.   2nd Session 1:1 tx (28 min) Tx focus on dynamic balance, functional ambulation without device, and Lt NMR during functional tasks.   Pt greeted in recliner, agreeable to tx. Pt opting to walk without device when provided with option. He ambulated down hallway with Min A and cues for decreasing speed and for Lt foot clearance.  Once in family room, pt engaged in typing activity, sending emails to family members. Pt hovering Lt hand above keys due to heavy reliance of vision for functional use (secondary to decreased sensation). Able to press correct keys with 75% accuracy with Lt. Cues for decreasing speed to improve accuracy. Also  practiced typing ABCs on blank word document with forced use of Lt 4th and 5th digit. Pt often pressing letter and producing multiple letters due to deficits with gauging pressure force. He reported he will continue practicing this at home for [employer]. Afterwards he ambulated back to room in manner as written above. He was left with all needs within reach at time of departure.   Therapy Documentation Precautions:  Precautions Precautions: Fall Precaution Comments:  (Left hemiparesis) Restrictions Weight Bearing Restrictions: No Vital Signs: Therapy Vitals Temp: 98.9 F (37.2 C) Temp Source: Oral Pulse Rate: 83 Resp: 14 BP: (!) 150/90 Patient Position (if appropriate): Lying Oxygen Therapy SpO2: 97 % O2 Device: Not Delivered Pain: C/o Lt arm soreness after 2nd tx. Applied pain relief ointment to shoulder before departure    ADL: ADL ADL Comments: Please see functional navigator for ADL status    See Function Navigator for Current Functional Status.   Therapy/Group: Individual Therapy  Nohea Kras A Markiesha Delia 04/03/2017, 7:21 AM

## 2017-04-03 NOTE — Progress Notes (Signed)
[facility] PHYSICAL MEDICINE & REHABILITATION     PROGRESS NOTE  Subjective/Complaints:  Feeling well. Sitting in chair. Good app  ROS: pt denies nausea, vomiting, diarrhea, cough, shortness of breath or chest pain   Objective: Vital Signs: Blood pressure 136/90, pulse 83, temperature 98.9 F (37.2 C), temperature source Oral, resp. rate 14, height 5\' 10"  (1.778 m), weight 102.1 kg (225 lb), SpO2 97 %. No results found. No results for input(s): WBC, HGB, HCT, PLT in the last 72 hours.  Recent Labs  04/03/17 0503  NA 137  K 3.4*  CL 103  GLUCOSE 98  BUN 26*  CREATININE 1.45*  CALCIUM 9.2   CBG (last 3)   Recent Labs  04/02/17 1710 04/02/17 2159 04/03/17 0620  GLUCAP 108* 76 99    Wt Readings from Last 3 Encounters:  03/23/17 102.1 kg (225 lb)  03/18/17 102.1 kg (225 lb)    Physical Exam:  BP 136/90   Pulse 83   Temp 98.9 F (37.2 C) (Oral)   Resp 14   Ht 5\' 10"  (1.778 m)   Wt 102.1 kg (225 lb)   SpO2 97%   BMI 32.28 kg/m  Constitutional: NAD. He appears well-developed and well-nourished.  HENT: Normocephalic. Atraumatic.  Eyes: EOMare normal. No discharge.  Cardiovascular: RRR without murmur. No JVD       . Respiratory: CTA Bilaterally without wheezes or rales. Normal effort    .  GI: Bowel sounds are normal. He exhibits no distension.  Neurological. Alert and oriented Mild facial droop Motor: RUE/RLE: 5/5 proximal to distal LUE: 4+/5 proximal to distal. LLE: 4+ to 5/5---stable Sensation 1++/2 Left arm and leg. Decreased FMC Skin. Warm and dry. Intact.  Psych: Normal mood and behavior.   Assessment/Plan: 1. Functional deficits secondary to right pontine small vessel infarct which require 3+ hours per day of interdisciplinary therapy in a comprehensive inpatient rehab setting. Physiatrist is providing close team supervision and 24 hour management of active medical problems listed below. Physiatrist and rehab team continue to assess barriers to  discharge/monitor patient progress toward functional and medical goals.  Function:  Bathing Bathing position   Position: Shower  Bathing parts Body parts bathed by patient: Front perineal area, Buttocks, Right upper leg, Left upper leg, Right arm, Left arm, Chest, Abdomen, Right lower leg, Left lower leg Body parts bathed by helper: Back  Bathing assist Assist Level: Supervision or verbal cues      Upper Body Dressing/Undressing Upper body dressing   What is the patient wearing?: Pull over shirt/dress     Pull over shirt/dress - Perfomed by patient: Thread/unthread right sleeve, Thread/unthread left sleeve, Put head through opening, Pull shirt over trunk          Upper body assist Assist Level: Supervision or verbal cues   Set up : To obtain clothing/put away  Lower Body Dressing/Undressing Lower body dressing   What is the patient wearing?: Underwear, Pants, Socks, Shoes Underwear - Performed by patient: Thread/unthread right underwear leg, Thread/unthread left underwear leg, Pull underwear up/down Underwear - Performed by helper: Pull underwear up/down Pants- Performed by patient: Thread/unthread right pants leg, Thread/unthread left pants leg, Pull pants up/down       Socks - Performed by patient: Don/doff right sock, Don/doff left sock   Shoes - Performed by patient: Don/doff right shoe, Don/doff left shoe, Fasten right, Fasten left Shoes - Performed by helper: Fasten right, Fasten left          Lower body  assist Assist for lower body dressing: Touching or steadying assistance (Pt > 75%)      Toileting Toileting Toileting activity did not occur: No continent bowel/bladder event Toileting steps completed by patient: Adjust clothing prior to toileting, Performs perineal hygiene, Adjust clothing after toileting Toileting steps completed by helper: Adjust clothing prior to toileting Toileting Assistive Devices: Grab bar or rail  Toileting assist Assist level:  Touching or steadying assistance (Pt.75%)   Transfers Chair/bed transfer   Chair/bed transfer method: Squat pivot Chair/bed transfer assist level: Supervision or verbal cues Chair/bed transfer assistive device: Armrests     Locomotion Ambulation     Max distance: 150 Assist level: Touching or steadying assistance (Pt > 75%)   Wheelchair   Type: Manual Max wheelchair distance: >150 ft Assist Level: Supervision or verbal cues  Cognition Comprehension Comprehension assist level: Follows complex conversation/direction with no assist  Expression Expression assist level: Expresses complex ideas: With no assist  Social Interaction Social Interaction assist level: Interacts appropriately with others - No medications needed.  Problem Solving Problem solving assist level: Solves complex problems: Recognizes & self-corrects  Memory Memory assist level: Complete Independence: No helper    Medical Problem List and Plan: 1. Left hemiparesissecondary to right pontine small vessel infarct on 8/4  Cont CIR therapies    2. DVT Prophylaxis/Anticoagulation: SCDs. Monitor for any signs of DVT 3. Pain Management/headaches: Topamax 50 mg twice a day--controlled 4. Mood: Provide emotional support 5. Neuropsych: This patient iscapable of making decisions on hisown behalf. 6. Skin/Wound Care: Routine skin checks 7. Fluids/Electrolytes/Nutrition: Routine I&Os 8.Hypertension.   Currently maintained on HCTZ 25 mg daily, Norvasc 10 mg daily.   Improved control 9. Diabetes mellitus. Hemoglobin A1c 8.1. Glucophage 500 mg twice a day. Check blood sugars before meals and at bedtime. Diabetic teaching  Relatively controlled  10. Hyperlipidemia. Lipitor 11.Obesity. BMI 32.28 kg/m2 12. Hyponatremia  Na+ 137 on 8/20  Cont to monitor 13. AKI  Cr 1.45 on 8/20  -  14. Hypokalemia  K+ up to 3.4 8/20  -continue to supp 15. Sleep disturbance  Trazodone 50 mg prn   LOS (Days) 11 A FACE TO FACE  EVALUATION WAS PERFORMED  Zachary [name] T 04/03/2017 9:59 AM

## 2017-04-03 NOTE — Progress Notes (Signed)
Physical Therapy Session Note  Patient Details  Name: Zachary [name] MRN: [id number] Date of Birth: 1970-11-11  Today's Date: 04/03/2017 PT Individual Time: [phone] and [phone] PT Individual Time Calculation (min): 26 min and 78 min   Short Term Goals: Week 2:  PT Short Term Goal 1 (Week 2): = LTG due to ELOS  Skilled Therapeutic Interventions/Progress Updates:  Treatment 1: Pt received in bed & agreeable to tx. Pt notes slight L shoulder pain but is using heating pad PRN. Discussed d/c plans & pt reports his wife will take a couple days off each week to stay home with him, otherwise he will be alone at home. Educated pt on need for mod I level in w/c unless his wife is home then he can be supervision level with RW & pt agreeable. Session focused on w/c parts management, w/c mobility, and transfers. Pt completes squat pivot w/c<>low, compliant couch, standard chair without armrests, and bed with supervision. Therapist provided instructional cuing & education regarding w/c parts management with pt return demonstrating locking of w/c brakes with cuing 50% of the time and management of armrests. Pt requires assistance for management of L leg rest but reports he will probably use LE to propel w/c at home. Pt also requires cuing for LLE placement and w/c placement during transfers and will benefit from additional practice prior to d/c home. At end of session pt left sitting in w/c set up with breakfast tray & access to all needs.   Treatment 2: Pt received in room & agreeable to tx. Pt denied c/o pain. Pt propels w/c room<>ortho gym with BLE with supervision and intermittent cuing to scoot back in seat. Pt requires cuing for w/c set up before completing squat pivot w/c>mat table. While supine on mat table pt performed BLE bridging with adductor hold, with abductor hold (green theraband), and LLE single leg bridging with instructional cuing for technique. Gait x >150 ft with RW & supervision with pt  demonstrating more controlled gait speed and placement of LLE. Pt completed Berg Balance Test scoring [phone]; educated pt on interpretation of score & current fall risk. Patient demonstrates increased fall risk as noted by score of 36/56 on Berg Balance Scale.  (<36= high risk for falls, close to 100%; 37-45 significant >80%; 46-51 moderate >50%; 52-55 lower >25%). Pt completed floor transfer with supervision assistance; therapist educated pt on situations in which he should call EMS after a fall & pt able to verbalize 2/4 back to therapist. Discussed home modifications to reduce fall risks (remove throw rugs & other tripping hazards) & pt voiced understanding. At end of session pt left in w/c in room with all needs within reach.  BP at end of session = 172/96 mmHg (LUE sitting) -- RN made aware HR = 103 bpm    Therapy Documentation Precautions:  Precautions Precautions: Fall Precaution Comments:  (Left hemiparesis) Restrictions Weight Bearing Restrictions: No   See Function Navigator for Current Functional Status.   Therapy/Group: Individual Therapy  [name] 04/03/2017, 11:03 AM

## 2017-04-03 NOTE — Plan of Care (Signed)
Problem: RH SKIN INTEGRITY Goal: RH STG SKIN FREE OF INFECTION/BREAKDOWN No skin breakdown  Outcome: Not Applicable Date Met: 25/05/39 Pt has no skin issues. Goal: RH STG MAINTAIN SKIN INTEGRITY WITH ASSISTANCE STG Maintain Skin Integrity With mod I    Outcome: Completed/Met Date Met: 04/03/17 Pt exceeded goal, no issues with skin.  Problem: Education: Goal: Ability to describe self-care measures that may prevent or decrease complications (Diabetes Survival Skills Education) will improve Outcome: Progressing Pt is watching videos and learning to manage diabetes.  Problem: Skin Integrity: Goal: Risk for impaired skin integrity will decrease Outcome: Not Applicable Date Met: 76/73/41 Pt has no skin issues

## 2017-04-04 ENCOUNTER — Inpatient Hospital Stay (HOSPITAL_COMMUNITY): Payer: Medicaid Other | Admitting: Physical Therapy

## 2017-04-04 ENCOUNTER — Inpatient Hospital Stay (HOSPITAL_COMMUNITY): Payer: Medicaid Other | Admitting: Occupational Therapy

## 2017-04-04 LAB — GLUCOSE, CAPILLARY
GLUCOSE-CAPILLARY: 117 mg/dL — AB (ref 65–99)
Glucose-Capillary: 97 mg/dL (ref 65–99)
Glucose-Capillary: 107 mg/dL — ABNORMAL HIGH (ref 65–99)
Glucose-Capillary: 132 mg/dL — ABNORMAL HIGH (ref 65–99)

## 2017-04-04 MED ORDER — LIVING WELL WITH DIABETES BOOK
Freq: Once | Status: AC
  Administered 2017-04-04: 17:00:00
  Filled 2017-04-04: qty 1

## 2017-04-04 MED ORDER — ATENOLOL 12.5 MG HALF TABLET
12.5000 mg | ORAL_TABLET | Freq: Every day | ORAL | Status: DC
  Administered 2017-04-04 – 2017-04-07 (×4): 12.5 mg via ORAL
  Filled 2017-04-04 (×4): qty 1

## 2017-04-04 NOTE — Plan of Care (Signed)
Problem: RH Balance Goal: LTG Patient will maintain dynamic standing balance (PT) LTG:  Patient will maintain dynamic standing balance with assistance during mobility activities (PT)  With LRAD; upgrade 2/2 progress  Problem: RH Furniture Transfers Goal: LTG Patient will perform furniture transfers w/assist (OT/PT LTG: Patient will perform furniture transfers  with assistance (OT/PT).  With LRAD; upgrade 2/2 progress  Problem: RH Ambulation Goal: LTG Patient will ambulate in controlled environment (PT) LTG: Patient will ambulate in a controlled environment, # of feet with assistance (PT).  150 ft with LRAD; upgrade 2/2 progress Goal: LTG Patient will ambulate in home environment (PT) LTG: Patient will ambulate in home environment, # of feet with assistance (PT).  72 ft with LRAD; upgrade 2/2 progress

## 2017-04-04 NOTE — Plan of Care (Signed)
Problem: RH Balance Goal: LTG Patient will maintain dynamic standing with ADLs (OT) LTG:  Patient will maintain dynamic standing balance with assist during activities of daily living (OT)   Upgraded secondary to progress  Problem: RH Bathing Goal: LTG Patient will bathe with assist, cues/equipment (OT) LTG: Patient will bathe specified number of body parts with assist with/without cues using equipment (position)  (OT)  Upgraded secondary to progress  Problem: RH Dressing Goal: LTG Patient will perform lower body dressing w/assist (OT) LTG: Patient will perform lower body dressing with assist, with/without cues in positioning using equipment (OT)  Upgraded secondary to progress  Problem: RH Toileting Goal: LTG Patient will perform toileting w/assist, cues/equip (OT) LTG: Patient will perform toiletiing (clothes management/hygiene) with assist, with/without cues using equipment (OT)  Upgraded secondary to progress  Problem: RH Toilet Transfers Goal: LTG Patient will perform toilet transfers w/assist (OT) LTG: Patient will perform toilet transfers with assist, with/without cues using equipment (OT)  Upgraded secondary to progress

## 2017-04-04 NOTE — Progress Notes (Signed)
Occupational Therapy Session Note  Patient Details  Name: Zachary [name] MRN: [id number] Date of Birth: 05/03/1971  Today's Date: 04/04/2017 OT Individual Time: 4098-1191 and 4782-9562 OT Individual Time Calculation (min): 41 min and 28 min    Short Term Goals: Week 2:  OT Short Term Goal 1 (Week 2): STGs=LTGs due to ELOS  Skilled Therapeutic Interventions/Progress Updates:   Session 1: Upon entering the room, pt seated in wheelchair with no c/o pain. Pt declined bathing and dressing this session with plans to perform task tomorrow. Pt ambulated 100' into day room with RW and close supervision. Pt standing at table for palmar translation task. Pt having multiple drops from L hand with use of marbles. Pt given larger checkers piece to complete task and pt able to move 4 pieces from hand individually and drop into cup with increased time and min cues for technique. Pt engaged in stand from chair with use of B LE's only x 10 reps initially steady assist and progressing to supervision with controlled sit. Pt taking seated rest break and returning to room at end of session and sitting in recliner chair. Call bell and all needed items within reach upon exiting the room.   Session 2: Upon entering the room, pt seated in recliner chair awaiting therapist with no c/o pain. Pt ambulating with SPC and close supervision for safety to [facility] gym. Pt standing for 3 minutes of dynavision activity with first the right hand and then the left hand. Pt standing unsupported with close supervision for safety as he reached out of base of support to hit targets. Pt's average reaction time with R hand was 1.28 seconds and he hit 141 targets compared to average reaction time of 2.12 seconds and 85 targets with L hand. Pt had no LOB with activity and taking seated rest break before returning to room in same manner. Call bell and all needed items within reach.   Therapy Documentation Precautions:  Precautions Precautions:  Fall Precaution Comments:  (Left hemiparesis) Restrictions Weight Bearing Restrictions: No General:   Vital Signs: Therapy Vitals Pulse Rate: 92 BP: (!) 150/90 Pain:   ADL: ADL ADL Comments: Please see functional navigator for ADL status Vision   Perception    Praxis   Exercises:   Other Treatments:    See Function Navigator for Current Functional Status.   Therapy/Group: Individual Therapy  Gypsy Decant 04/04/2017, 12:52 PM

## 2017-04-04 NOTE — Progress Notes (Signed)
[facility] PHYSICAL MEDICINE & REHABILITATION     PROGRESS NOTE  Subjective/Complaints:  Up walking with PT. Feeling well. No new issues this morning.   ROS: pt denies nausea, vomiting, diarrhea, cough, shortness of breath or chest pain    Objective: Vital Signs: Blood pressure (!) 148/91, pulse (!) 108, temperature 99 F (37.2 C), temperature source Oral, resp. rate 15, height 5\' 10"  (1.778 m), weight 102.1 kg (225 lb), SpO2 99 %. No results found. No results for input(s): WBC, HGB, HCT, PLT in the last 72 hours.  Recent Labs  04/03/17 0503  NA 137  K 3.4*  CL 103  GLUCOSE 98  BUN 26*  CREATININE 1.45*  CALCIUM 9.2   CBG (last 3)   Recent Labs  04/03/17 1703 04/03/17 2127 04/04/17 0651  GLUCAP 104* 103* 97    Wt Readings from Last 3 Encounters:  03/23/17 102.1 kg (225 lb)  03/18/17 102.1 kg (225 lb)    Physical Exam:  BP (!) 148/91 (BP Location: Left Arm)   Pulse (!) 108   Temp 99 F (37.2 C) (Oral)   Resp 15   Ht 5\' 10"  (1.778 m)   Wt 102.1 kg (225 lb)   SpO2 99%   BMI 32.28 kg/m  Constitutional: NAD. He appears well-developed and well-nourished.  HENT: Normocephalic. Atraumatic.  Eyes: EOMare normal. No discharge.  Cardiovascular: sl tachy. No jvd     . Respiratory: CTA Bilaterally without wheezes or rales. Normal effort     .  GI: Bowel sounds are normal. He exhibits no distension.  Neurological. Alert and oriented Mild facial droop Motor: RUE/RLE: 5/5 proximal to distal LUE: 4+/5 proximal to distal. LLE: 4+ to 5/5-improving Sensation 1++/2 Left arm and leg. Decreased FMC Skin. Warm and dry. Intact.  Psych: Normal mood and behavior.   Assessment/Plan: 1. Functional deficits secondary to right pontine small vessel infarct which require 3+ hours per day of interdisciplinary therapy in a comprehensive inpatient rehab setting. Physiatrist is providing close team supervision and 24 hour management of active medical problems listed  below. Physiatrist and rehab team continue to assess barriers to discharge/monitor patient progress toward functional and medical goals.  Function:  Bathing Bathing position   Position: Shower  Bathing parts Body parts bathed by patient: Front perineal area, Buttocks, Right upper leg, Left upper leg, Right arm, Left arm, Chest, Abdomen, Right lower leg, Left lower leg Body parts bathed by helper: Back  Bathing assist Assist Level: Supervision or verbal cues      Upper Body Dressing/Undressing Upper body dressing   What is the patient wearing?: Pull over shirt/dress     Pull over shirt/dress - Perfomed by patient: Thread/unthread right sleeve, Thread/unthread left sleeve, Put head through opening, Pull shirt over trunk          Upper body assist Assist Level: Set up   Set up : To obtain clothing/put away  Lower Body Dressing/Undressing Lower body dressing   What is the patient wearing?: Shoes, Socks Underwear - Performed by patient: Thread/unthread right underwear leg, Thread/unthread left underwear leg, Pull underwear up/down Underwear - Performed by helper: Pull underwear up/down Pants- Performed by patient: Thread/unthread right pants leg, Thread/unthread left pants leg, Pull pants up/down   Non-skid slipper socks- Performed by patient: Don/doff right sock, Don/doff left sock   Socks - Performed by patient: Don/doff right sock, Don/doff left sock   Shoes - Performed by patient: Don/doff right shoe, Don/doff left shoe Shoes - Performed by helper: Fasten  right, Fasten left          Lower body assist Assist for lower body dressing: Touching or steadying assistance (Pt > 75%)      Toileting Toileting Toileting activity did not occur: No continent bowel/bladder event Toileting steps completed by patient: Adjust clothing prior to toileting, Performs perineal hygiene, Adjust clothing after toileting Toileting steps completed by helper: Adjust clothing prior to  toileting Toileting Assistive Devices: Grab bar or rail  Toileting assist Assist level: Touching or steadying assistance (Pt.75%)   Transfers Chair/bed transfer   Chair/bed transfer method: Squat pivot Chair/bed transfer assist level: Supervision or verbal cues Chair/bed transfer assistive device: Armrests     Locomotion Ambulation     Max distance: 150 ft Assist level: Supervision or verbal cues   Wheelchair   Type: Manual Max wheelchair distance: 150 ft Assist Level: Supervision or verbal cues  Cognition Comprehension Comprehension assist level: Follows complex conversation/direction with no assist  Expression Expression assist level: Expresses complex ideas: With no assist  Social Interaction Social Interaction assist level: Interacts appropriately with others - No medications needed.  Problem Solving Problem solving assist level: Solves complex problems: Recognizes & self-corrects  Memory Memory assist level: Complete Independence: No helper    Medical Problem List and Plan: 1. Left hemiparesissecondary to right pontine small vessel infarct on 8/4  Cont CIR therapies   -team conference today 2. DVT Prophylaxis/Anticoagulation: SCDs. Monitor for any signs of DVT 3. Pain Management/headaches: Topamax 50 mg twice a day-  4. Mood: Provide emotional support 5. Neuropsych: This patient iscapable of making decisions on hisown behalf. 6. Skin/Wound Care: Routine skin checks 7. Fluids/Electrolytes/Nutrition: Routine I&Os 8.Hypertension.   Currently maintained on HCTZ 25 mg daily, Norvasc 10 mg daily.   -given persistent HR elevation and borderline bp, will start low dose tenormin    9. Diabetes mellitus. Hemoglobin A1c 8.1. Glucophage 500 mg twice a day. Check blood sugars before meals and at bedtime. Diabetic teaching  Relatively controlled  10. Hyperlipidemia. Lipitor 11.Obesity. BMI 32.28 kg/m2 12. Hyponatremia  Na+ 137 on 8/20  Cont to monitor 13. AKI  Cr 1.45  on 8/20  -  14. Hypokalemia  K+ up to 3.4 8/20  -continue to supp 15. Sleep disturbance  Trazodone 50 mg prn   LOS (Days) 12 A FACE TO FACE EVALUATION WAS PERFORMED  Stepheni Cameron T 04/04/2017 10:38 AM

## 2017-04-04 NOTE — Progress Notes (Signed)
Physical Therapy Session Note  Patient Details  Name: Zachary [name] MRN: [id number] Date of Birth: November 22, 1970  Today's Date: 04/04/2017 PT Individual Time: 0626-9485 and 4627-0350 PT Individual Time Calculation (min): 56 min and  56 min  Short Term Goals: Week 2:  PT Short Term Goal 1 (Week 2): = LTG due to ELOS  Skilled Therapeutic Interventions/Progress Updates:  Treatment 1: Pt received in bathroom & agreeable to tx, denying c/o pain. Pt ambulates throughout bathroom and room with RW & supervision then dons socks and shoes sitting EOB. Pt ambulates room<>gym with RW & supervision. Pt negotiates 16 steps (6") with R rail and supervision to simulate home setup. Pt reports increased difficulty with descending stairs compared to ascending. Pt completed standing toe taps on 3" step then taller cones with min assist for balance with task focusing on weight shifting and coordination, NMR LLE. Pt with decreased balance when weight bearing through LLE only and requires cuing for technique when tapping cone with LLE. Pt engaged in bead retrieval from pink (medium) therapy putty with LUE with task focusing on L hand strengthening & FMC. At end of session pt left sitting in recliner with BLE elevated & all needs within reach.   After stair negotiation BP = 148/91 mmHg (LUE, sitting), HR = 108 bpm.     Treatment 2: Pt received in room & agreeable to tx, denying c/o pain. Session focused on gait training, pt education, endurance training, NMR, and balance. Pt ambulates room>dayroom with RW & supervision. Gait training with SPC 100 ft x 3 + 35 ft + 100 ft with min assist progressing to close supervision with instructional cuing & education for gait pattern with AD and good return demo by pt. Pt requires rest breaks often 2/2 LLE fatigue.  Discussed upgrading pt's goals to mod I with LRAD at home & pt agreeable. Pt utilized nu-step up to level 4 x 10 minutes with all 4 extremities and task focusing on  coordination of reciprocal movements, endurance training, and L NMR; pt reported 6/10 RPE. Pt utilized [employer] Limits of Stability with BUE>1UE>no UE support with task focusing on weight shifting in all directions. Pt with about equal percentage accuracy when holding with either UE or without UE. At end of session pt left sitting in recliner with all needs within reach.  BP after nu-step = 136/85 mmHg (LUE, sitting), HR = 89 bpm    Therapy Documentation Precautions:  Precautions Precautions: Fall Precaution Comments:  (Left hemiparesis) Restrictions Weight Bearing Restrictions: No   See Function Navigator for Current Functional Status.   Therapy/Group: Individual Therapy  [name] 04/04/2017, 3:01 PM

## 2017-04-05 ENCOUNTER — Inpatient Hospital Stay (HOSPITAL_COMMUNITY): Payer: Medicaid Other | Admitting: Physical Therapy

## 2017-04-05 ENCOUNTER — Inpatient Hospital Stay (HOSPITAL_COMMUNITY): Payer: Medicaid Other | Admitting: Occupational Therapy

## 2017-04-05 LAB — GLUCOSE, CAPILLARY
GLUCOSE-CAPILLARY: 88 mg/dL (ref 65–99)
GLUCOSE-CAPILLARY: 88 mg/dL (ref 65–99)
GLUCOSE-CAPILLARY: 96 mg/dL (ref 65–99)
Glucose-Capillary: 90 mg/dL (ref 65–99)

## 2017-04-05 NOTE — Progress Notes (Signed)
[facility] PHYSICAL MEDICINE & REHABILITATION     PROGRESS NOTE  Subjective/Complaints:  No new issues. Slept well.   ROS: pt denies nausea, vomiting, diarrhea, cough, shortness of breath or chest pain    Objective: Vital Signs: Blood pressure 128/87, pulse 76, temperature 98.7 F (37.1 C), temperature source Oral, resp. rate 15, height 5\' 10"  (1.778 m), weight 102.1 kg (225 lb), SpO2 100 %. No results found. No results for input(s): WBC, HGB, HCT, PLT in the last 72 hours.  Recent Labs  04/03/17 0503  NA 137  K 3.4*  CL 103  GLUCOSE 98  BUN 26*  CREATININE 1.45*  CALCIUM 9.2   CBG (last 3)   Recent Labs  04/04/17 1629 04/04/17 2058 04/05/17 0652  GLUCAP 132* 117* 88    Wt Readings from Last 3 Encounters:  03/23/17 102.1 kg (225 lb)  03/18/17 102.1 kg (225 lb)    Physical Exam:  BP 128/87 (BP Location: Left Arm)   Pulse 76   Temp 98.7 F (37.1 C) (Oral)   Resp 15   Ht 5\' 10"  (1.778 m)   Wt 102.1 kg (225 lb)   SpO2 100%   BMI 32.28 kg/m  Constitutional: NAD. He appears well-developed and well-nourished.  HENT: Normocephalic. Atraumatic.  Eyes: EOMare normal. No discharge.  Cardiovascular: pulse in 80's. Reg rythm     . Respiratory: CTA Bilaterally without wheezes or rales. Normal effort   .  GI: Bowel sounds are normal. He exhibits no distension.  Neurological. Alert and oriented Mild facial droop Motor: RUE/RLE: 5/5 proximal to distal LUE: 4+/5 proximal to distal. LLE: 4+ to 5/5-stable Sensation 1++/2 Left arm and leg. Decreased FMC Skin. Warm and dry. Intact.  Psych: Normal mood and behavior.   Assessment/Plan: 1. Functional deficits secondary to right pontine small vessel infarct which require 3+ hours per day of interdisciplinary therapy in a comprehensive inpatient rehab setting. Physiatrist is providing close team supervision and 24 hour management of active medical problems listed below. Physiatrist and rehab team continue to assess  barriers to discharge/monitor patient progress toward functional and medical goals.  Function:  Bathing Bathing position   Position: Shower  Bathing parts Body parts bathed by patient: Front perineal area, Buttocks, Right upper leg, Left upper leg, Right arm, Left arm, Chest, Abdomen, Right lower leg, Left lower leg Body parts bathed by helper: Back  Bathing assist Assist Level: Supervision or verbal cues      Upper Body Dressing/Undressing Upper body dressing   What is the patient wearing?: Pull over shirt/dress     Pull over shirt/dress - Perfomed by patient: Thread/unthread right sleeve, Thread/unthread left sleeve, Put head through opening, Pull shirt over trunk          Upper body assist Assist Level: Set up   Set up : To obtain clothing/put away  Lower Body Dressing/Undressing Lower body dressing   What is the patient wearing?: Shoes, Socks Underwear - Performed by patient: Thread/unthread right underwear leg, Thread/unthread left underwear leg, Pull underwear up/down Underwear - Performed by helper: Pull underwear up/down Pants- Performed by patient: Thread/unthread right pants leg, Thread/unthread left pants leg, Pull pants up/down   Non-skid slipper socks- Performed by patient: Don/doff right sock, Don/doff left sock   Socks - Performed by patient: Don/doff right sock, Don/doff left sock   Shoes - Performed by patient: Don/doff right shoe, Don/doff left shoe Shoes - Performed by helper: Fasten right, Fasten left  Lower body assist Assist for lower body dressing: Touching or steadying assistance (Pt > 75%)      Toileting Toileting Toileting activity did not occur: No continent bowel/bladder event Toileting steps completed by patient: Adjust clothing prior to toileting, Performs perineal hygiene, Adjust clothing after toileting Toileting steps completed by helper: Adjust clothing prior to toileting Toileting Assistive Devices: Grab bar or rail   Toileting assist Assist level: Touching or steadying assistance (Pt.75%)   Transfers Chair/bed transfer   Chair/bed transfer method: Ambulatory Chair/bed transfer assist level: Supervision or verbal cues Chair/bed transfer assistive device: Armrests, Medical sales representative     Max distance: >150 ft Assist level: Supervision or verbal cues   Wheelchair   Type: Manual Max wheelchair distance: 150 ft Assist Level: Supervision or verbal cues  Cognition Comprehension Comprehension assist level: Follows complex conversation/direction with no assist  Expression Expression assist level: Expresses complex ideas: With no assist  Social Interaction Social Interaction assist level: Interacts appropriately with others - No medications needed.  Problem Solving Problem solving assist level: Solves complex problems: Recognizes & self-corrects  Memory Memory assist level: Complete Independence: No helper    Medical Problem List and Plan: 1. Left hemiparesissecondary to right pontine small vessel infarct on 8/4  Cont CIR therapies   -using cane with PT 2. DVT Prophylaxis/Anticoagulation: SCDs. Monitor for any signs of DVT 3. Pain Management/headaches: Topamax 50 mg twice a day-  4. Mood: Provide emotional support 5. Neuropsych: This patient iscapable of making decisions on hisown behalf. 6. Skin/Wound Care: Routine skin checks 7. Fluids/Electrolytes/Nutrition: Routine I&Os 8.Hypertension.   Currently maintained on HCTZ 25 mg daily, Norvasc 10 mg daily.   -tolerating low dose tenormin--HR in 80's now    9. Diabetes mellitus. Hemoglobin A1c 8.1. Glucophage 500 mg twice a day. Check blood sugars before meals and at bedtime. Diabetic teaching   controlled  10. Hyperlipidemia. Lipitor 11.Obesity. BMI 32.28 kg/m2 12. Hyponatremia  Na+ 137 on 8/20  Cont to monitor 13. AKI  Cr 1.45 on 8/20  -  14. Hypokalemia  K+ up to 3.4 8/20  -continue to supp 15. Sleep  disturbance  Trazodone 50 mg prn   LOS (Days) 13 A FACE TO FACE EVALUATION WAS PERFORMED  Karma Hiney T 04/05/2017 9:01 AM

## 2017-04-05 NOTE — Progress Notes (Addendum)
Physical Therapy Session Note  Patient Details  Name: Zachary [name] MRN: [id number] Date of Birth: Jul 01, 1971  Today's Date: 04/05/2017 PT Individual Time: [phone] and 1419-1531 PT Individual Time Calculation (min): 56 min and 72 min  Short Term Goals: Week 2:  PT Short Term Goal 1 (Week 2): = LTG due to ELOS   Skilled Therapeutic Interventions/Progress Updates:  Treatment 1: Pt received in room with wife [name]) present for family ed, pt denying c/o pain. Session focused on family ed, LE NMR, balance, and strengthening. Pt ambulates throughout unit with RW & distant supervision, negotiating uneven surface (mulch), ramp, and curb with RW & supervision to simulate community mobility. Pt completes car transfer with supervision at low sedan simulated height. Pt negotiates 16 steps with 1 rail and supervision correctly demonstrating compensatory pattern. Provided pt with walker bag & educated him on safe ways to transport items. Educated pt & wife on HHPT and DME recommendations as well as home modifications for increased safety. Pt utilized cybex kinetron in standing without BUE support with task focusing on weight shifting, L NMR, and strengthening. Pt engaged in removing and replacing lids on medication bottles with LUE with task focusing on NMR control of hand. At end of session pt left sitting in recliner with wife present to supervise.    Treatment 2: Pt received in room & agreeable to tx, denying c/o pain. Session focused on pt education, endurance training & activity tolerance, and balance. Pt engaged in pet therapy, petting dog with LUE for NMR. Pt ambulated room<>outside north tower with 3 rest breaks; pt utilized RW with mod I over even surfaces and supervision over outdoor surfaces. Educated pt on stroke symptoms and diet & exercise upon d/c; also educated him on energy conservation methods. Back on unit pt performed forwards & backwards walking without AD & min assist, and braiding with  UE support on rail with min assist with task focusing on LE coordination, NMR, and balance. Pt utilized nu-step on level 4 x 7 minutes with BLE & LUE only with task focusing on L NMR & endurance training. At end of session pt left sitting in recliner with all needs within reach.  2:22pm: BP = 139/87 mmHg (LUE standing), HR = 87 bpm 3:17pm: BP = 144/97 mmHg (LUE sitting), HR = 86 bpm    Therapy Documentation Precautions:  Precautions Precautions: Fall Precaution Comments:  (Left hemiparesis) Restrictions Weight Bearing Restrictions: No   See Function Navigator for Current Functional Status.   Therapy/Group: Individual Therapy  [name] 04/05/2017, 3:42 PM

## 2017-04-05 NOTE — Progress Notes (Signed)
Occupational Therapy Session Note  Patient Details  Name: Zachary [name] MRN: [id number] Date of Birth: May 08, 1971  Today's Date: 04/05/2017 OT Individual Time: 2714-2320 OT Individual Time Calculation (min): 59 min    Short Term Goals: Week 1:  OT Short Term Goal 1 (Week 1): Pt will complete shower transfer with Min A OT Short Term Goal 1 - Progress (Week 1): Met OT Short Term Goal 2 (Week 1): Pt will complete sit<stand for LB dressing with Min A OT Short Term Goal 2 - Progress (Week 1): Met OT Short Term Goal 3 (Week 1): Pt will complete 1/3 components of toileting tasks OT Short Term Goal 3 - Progress (Week 1): Met  Skilled Therapeutic Interventions/Progress Updates:    Upon entering the room, pt seated in recliner chair with wife, his caregiver, present for hand on family education. OT provided education regarding OT purpose, POC, and progress towards goals to caregiver who verbalized understanding. Pt gathered clothing items while standing at dresser with RW at mod I level. Pt ambulating while 150' with RW to tub room with wife present at mod I level. Pt demonstrating transfer onto TTB with supervision for safety. Pt bathing while therapist discussed discharge recommendation for TTB, safety treads, and supervision for shower transfer in order to decrease fall risk with functional task. Pt dressing from edge of TTB with caregiver observing. Pt returning to room at end of session in same manner. OT answered any questions and pt with all needs within reach upon exiting.   Therapy Documentation Precautions:  Precautions Precautions: Fall Precaution Comments:  (Left hemiparesis) Restrictions Weight Bearing Restrictions: No General:   Vital Signs:   Pain: Pain Assessment Pain Assessment: No/denies pain Pain Score: 0-No pain ADL: ADL ADL Comments: Please see functional navigator for ADL status Vision   Perception    Praxis   Exercises:   Other Treatments:    See Function  Navigator for Current Functional Status.   Therapy/Group: Individual Therapy  Gypsy Decant 04/05/2017, 10:03 AM

## 2017-04-05 NOTE — Progress Notes (Signed)
Recreational Therapy Session Note  Patient Details  Name: Zachary [name] MRN: [id number] Date of Birth: [birth year] Today's Date: 04/05/2017  Pt participated in animal assisted activity/therapy standing with RW with supervision.  [place] 04/05/2017, 3:46 PM

## 2017-04-06 ENCOUNTER — Inpatient Hospital Stay (HOSPITAL_COMMUNITY): Payer: Self-pay | Admitting: Physical Therapy

## 2017-04-06 ENCOUNTER — Inpatient Hospital Stay (HOSPITAL_COMMUNITY): Payer: Self-pay | Admitting: Occupational Therapy

## 2017-04-06 ENCOUNTER — Inpatient Hospital Stay (HOSPITAL_COMMUNITY): Payer: Medicaid Other | Admitting: Physical Therapy

## 2017-04-06 ENCOUNTER — Encounter (HOSPITAL_COMMUNITY): Payer: Self-pay

## 2017-04-06 LAB — GLUCOSE, CAPILLARY
GLUCOSE-CAPILLARY: 103 mg/dL — AB (ref 65–99)
Glucose-Capillary: 131 mg/dL — ABNORMAL HIGH (ref 65–99)
Glucose-Capillary: 81 mg/dL (ref 65–99)

## 2017-04-06 MED ORDER — AMLODIPINE BESYLATE 10 MG PO TABS: 10.0000 mg | ORAL_TABLET | Freq: Every day | ORAL | 0 refills | Status: DC

## 2017-04-06 MED ORDER — METFORMIN HCL 500 MG PO TABS: 500.0000 mg | ORAL_TABLET | Freq: Two times a day (BID) | ORAL | 0 refills | Status: DC

## 2017-04-06 MED ORDER — TOPIRAMATE 50 MG PO TABS: 50.0000 mg | ORAL_TABLET | Freq: Two times a day (BID) | ORAL | 0 refills | Status: DC

## 2017-04-06 MED ORDER — CLOPIDOGREL BISULFATE 75 MG PO TABS: 75.0000 mg | ORAL_TABLET | Freq: Every day | ORAL | 0 refills | Status: DC

## 2017-04-06 MED ORDER — ATENOLOL 25 MG PO TABS: 12.5000 mg | ORAL_TABLET | Freq: Every day | ORAL | 0 refills | Status: DC

## 2017-04-06 MED ORDER — ASPIRIN 81 MG PO TBEC: 81.0000 mg | DELAYED_RELEASE_TABLET | Freq: Every day | ORAL | Status: DC

## 2017-04-06 MED ORDER — POTASSIUM CHLORIDE CRYS ER 20 MEQ PO TBCR: 20.0000 meq | EXTENDED_RELEASE_TABLET | Freq: Every day | ORAL | 0 refills | Status: DC

## 2017-04-06 MED ORDER — ATORVASTATIN CALCIUM 20 MG PO TABS: 20.0000 mg | ORAL_TABLET | Freq: Every day | ORAL | 0 refills | Status: DC

## 2017-04-06 MED ORDER — HYDROCHLOROTHIAZIDE 25 MG PO TABS
25.0000 mg | ORAL_TABLET | Freq: Every day | ORAL | 0 refills | Status: DC
Start: 2017-04-07 — End: 2017-04-14

## 2017-04-06 NOTE — Progress Notes (Signed)
Occupational Therapy Discharge Summary  Patient Details  Name: Zachary [name] MRN: [id number] Date of Birth: 08/11/1971  Today's Date: 04/06/2017 OT Individual Time: 1000-1100 OT Individual Time Calculation (min): 60 min    Patient has met 12 of 12 long term goals due to improved activity tolerance, improved balance, postural control, functional use of  LEFT upper extremity and improved attention.  Patient to discharge at overall Modified Independent level.  Patient's care partner is independent to provide the necessary physical assistance at discharge.    Reasons goals not met: n/a  Recommendation:  Patient will benefit from ongoing skilled OT services in home health setting to continue to advance functional skills in the area of BADL and iADL.  Equipment: walker, tub transfer bench  Reasons for discharge: treatment goals met  Patient/family agrees with progress made and goals achieved: Yes   Treatment session focused on ADL/self care training, pt education, transfer training, [facility] tasks, NMR, activity tolerance, and safety awareness. Pt completed AM ADLs including shower on tub bench in walk in shower, dressing, and grooming. Pt completed shower at tub level with mod I and dressing/goroming tasks at walker level. Pt noted to be cautious during ADLs and functional mobility. thearpist provided safety cues and remimnders during transfers for safe sitting techniques. Pt demonstrates overall F+ balance during dynamic standing ADLs/tasks. Pt completed [facility] tasks with focus on L UE for holding and turning cards for L wrist, digit precision. Pt/therapist discuss home discharge. No pain noted during session. Pt was returned to room via walker and left with needs met.  OT Discharge Precautions/Restrictions  Precautions Precautions: Fall Restrictions Weight Bearing Restrictions: No Vital Signs Therapy Vitals Temp: 98.2 F (36.8 C) Temp Source: Oral Pulse Rate: 80 Resp: 16 BP: (!)  146/84 Patient Position (if appropriate): Sitting Oxygen Therapy SpO2: 99 % O2 Device: Not Delivered Pain Pain Assessment Pain Assessment: No/denies pain ADL ADL Eating: Modified independent Where Assessed-Eating: Edge of bed Grooming: Modified independent Where Assessed-Grooming: Standing at sink Upper Body Bathing: Modified independent Where Assessed-Upper Body Bathing: Shower Lower Body Bathing: Modified independent Where Assessed-Lower Body Bathing: Shower Upper Body Dressing: Modified independent (Device) Where Assessed-Upper Body Dressing: Sitting at sink Lower Body Dressing: Modified independent Where Assessed-Lower Body Dressing: Sitting at sink Toileting: Modified independent Where Assessed-Toileting: Glass blower/designer: Diplomatic Services operational officer Method: Counselling psychologist: Energy manager: Modified independent Insurance underwriter: Facilities manager: Modified independent Social research officer, government Method: Heritage manager: Radio broadcast assistant ADL Comments: Please see functional navigator for ADL status Vision Baseline Vision/History: No visual deficits Patient Visual Report: No change from baseline Vision Assessment?: No apparent visual deficits Perception  Perception: Within Functional Limits Praxis Praxis: Intact Cognition Overall Cognitive Status: Within Functional Limits for tasks assessed Arousal/Alertness: Awake/alert Orientation Level: Oriented X4 Attention: Sustained Sustained Attention: Appears intact Memory: Appears intact Awareness: Appears intact Problem Solving: Appears intact Safety/Judgment: Appears intact Sensation Sensation Light Touch: Appears Intact Light Touch Impaired Details: Impaired LUE Hot/Cold: Appears Intact Proprioception: Impaired Detail Proprioception Impaired Details: Impaired LUE Coordination Gross Motor Movements are Fluid and  Coordinated: Yes Fine Motor Movements are Fluid and Coordinated: No (mild FMC impairment in L UE ) Motor  Motor Motor: Hemiplegia Motor - Skilled Clinical Observations: L hemiplegia, general weakness Mobility  Bed Mobility Bed Mobility: Rolling Left;Rolling Right;Supine to Sit;Sit to Supine Rolling Right: 6: Modified independent (Device/Increase time) Rolling Left: 6: Modified independent (Device/Increase time) Supine to Sit: 6: Modified independent (Device/Increase time)  Sit to Supine: 6: Modified independent (Device/Increase time) Transfers Transfers: Sit to Stand;Stand to Sit Sit to Stand: 6: Modified independent (Device/Increase time) Stand to Sit: 6: Modified independent (Device/Increase time)  Trunk/Postural Assessment  Cervical Assessment Cervical Assessment: Within Functional Limits Thoracic Assessment Thoracic Assessment: Within Functional Limits Lumbar Assessment Lumbar Assessment: Within Functional Limits  Balance Balance Balance Assessed: Yes Dynamic Sitting Balance Dynamic Sitting - Balance Support: Feet supported Dynamic Sitting - Level of Assistance: 6: Modified independent (Device/Increase time) Dynamic Sitting Balance - Compensations: LB bathing/dressing , seated Static Standing Balance Static Standing - Balance Support: During functional activity;Left upper extremity supported Dynamic Standing Balance Dynamic Standing - Level of Assistance: 6: Modified independent (Device/Increase time) Extremity/Trunk Assessment RUE Assessment RUE Assessment: Within Functional Limits LUE Assessment LUE Assessment: Exceptions to [facility] LUE Strength LUE Overall Strength Comments: mild weakness to L UE with 4+/5 in all muscle groups   See Function Navigator for Current Functional Status.  Delon [place] 04/06/2017, 3:59 PM

## 2017-04-06 NOTE — Plan of Care (Signed)
Problem: RH Bed to Chair Transfers Goal: LTG Patient will perform bed/chair transfers w/assist (PT) LTG: Patient will perform bed/chair transfers with assistance, with/without cues (PT).  Outcome: Completed/Met Date Met: 04/06/17 With RW  Problem: RH Car Transfers Goal: LTG Patient will perform car transfers with assist (PT) LTG: Patient will perform car transfers with assistance (PT).  Outcome: Completed/Met Date Met: 04/06/17 With RW  Problem: RH Ambulation Goal: LTG Patient will ambulate in controlled environment (PT) LTG: Patient will ambulate in a controlled environment, # of feet with assistance (PT).  Outcome: Completed/Met Date Met: 04/06/17 150 ft with RW Goal: LTG Patient will ambulate in home environment (PT) LTG: Patient will ambulate in home environment, # of feet with assistance (PT).  Outcome: Completed/Met Date Met: 04/06/17 50 ft with RW  Problem: RH Wheelchair Mobility Goal: LTG Patient will propel w/c in controlled environment (PT) LTG: Patient will propel wheelchair in controlled environment, # of feet with assist (PT)  Outcome: Adequate for Discharge Pt to d/c at ambulatory level with RW Goal: LTG Patient will propel w/c in home environment (PT) LTG: Patient will propel wheelchair in home environment, # of feet with assistance (PT).   Outcome: Adequate for Discharge Pt to d/c at ambulatory level  Problem: RH Stairs Goal: LTG Patient will ambulate up and down stairs w/assist (PT) LTG: Patient will ambulate up and down # of stairs with assistance (PT)  Outcome: Completed/Met Date Met: 04/06/17 16 steps with 1 rail

## 2017-04-06 NOTE — Patient Care Conference (Signed)
Inpatient RehabilitationTeam Conference and Plan of Care Update Date: 04/04/2017   Time: 2:30 PM    Patient Name: Zachary [name]      Medical Record Number: [id number]  Date of Birth: 06-Aug-1971 Sex: Male         Room/Bed: 4W08C/4W08C-01 Payor Info: Payor: MEDICAID POTENTIAL / Plan: MEDICAID POTENTIAL / Product Type: *No Product type* /    Admitting Diagnosis: R CVA  Admit Date/Time:  03/23/2017  4:53 PM Admission Comments: No comment available   Primary Diagnosis:  Right pontine cerebrovascular accident [facility]) Principal Problem: Right pontine cerebrovascular accident [facility])  Patient Active Problem List   Diagnosis Date Noted  . Adjustment disorder with mixed anxiety and depressed mood   . Sleep disturbance   . Hyperlipidemia LDL goal <70   . Hypokalemia   . Hyponatremia   . AKI (acute kidney injury) ([place])   . Diabetes mellitus type 2 in nonobese (HCC)   . Benign essential HTN   . Hemiparesis affecting left side as late effect of cerebrovascular accident (CVA) ([place]) 03/23/2017  . Gait disturbance, post-stroke 03/23/2017  . Right pontine cerebrovascular accident ([place]) 03/23/2017  . Left pontine stroke (HCC) - Right paramedian pontine infarct likely secondary to small vessel disease versus symptomatic terminal right vertebral artery stenosis. 03/18/2017    Expected Discharge Date: Expected Discharge Date: 04/07/17  Team Members Present: Physician leading conference: Dr. Alger Simons Social Worker Present: Lennart Pall, LCSW Nurse Present: Heather Roberts, RN PT Present: Lavone Nian, PT OT Present: Benay Pillow, OT SLP Present: Weston Anna, SLP PPS Coordinator present : Daiva Nakayama, RN, CRRN     Current Status/Progress Goal Weekly Team Focus  Medical   improving left sided strength, still hypokalemic, bp elevated  stabilize medically for dc  bp and hr control, electrolyte repletion   Bowel/Bladder   continent of b/b, LBM-(small) 8/18, pt required sorbitol, no stool  softeners scheduled  continent of b/b, maintain b/b with min assist, pt meeting goals  assess b/b function q shift and prn, monitor for constipation and administration of sorbitol as needed.   Swallow/Nutrition/ Hydration             ADL's   Supervision bathing shower level, Min A shower transfers, setup UB dressing, steady assist LB dressing, steady assist toilet transfers + toileting   Supervision overall   Lt NMR, dynamic balance, d/c planning, pt education    Mobility   supervision overall with RW & w/c  supervision with RW, mod I with w/c & bed<>chair transfers  d/c planning, pt education, gait, transfers, w/c mobility, safety, NMR, balance   Communication             Safety/Cognition/ Behavioral Observations            Pain   on Topamax 50mg  bid, rare c/o of h/a, tylenol prn for h/a, last given 8/17  pain <2 with min assist, meeting goal  assess for pain q shift and prn, administer medication as ordered.   Skin   skin is not an issue  skin is not an issue  assess skin q shift and prn    Rehab Goals Patient on target to meet rehab goals: Yes *See Care Plan and progress notes for long and short-term goals.     Barriers to Discharge  Current Status/Progress Possible Resolutions Date Resolved   Physician    Medical stability        add bp medication, electrolye replenishment       Nursing  PT  Lack of/limited family support  pt's wife only able to provide 24 hr supervision intermittently throughout week, pt will need to be mod I w/c level for safe d/c home alone              OT Decreased caregiver support                SLP                SW                Discharge Planning/Teaching Needs:  Pt plans to go to his home with his wife, wife's cousin to assist, and possible pt's sister from [place]. to come.  Pt will 24/7 supervision for a few weeks.  Teaching with wife planned for tomorrow morning.   Team Discussion:  Making excellent progress and overall  goals upgraded to mod independent.  Ready for d/c end of week with [facility] follow up.  No concerns.  Revisions to Treatment Plan:  None    Continued Need for Acute Rehabilitation Level of Care: The patient requires daily medical management by a physician with specialized training in physical medicine and rehabilitation for the following conditions: Daily direction of a multidisciplinary physical rehabilitation program to ensure safe treatment while eliciting the highest outcome that is of practical value to the patient.: Yes Daily medical management of patient stability for increased activity during participation in an intensive rehabilitation regime.: Yes Daily analysis of laboratory values and/or radiology reports with any subsequent need for medication adjustment of medical intervention for : Neurological problems;Blood pressure problems  Zaylee Cornia 04/06/2017, 2:13 PM

## 2017-04-06 NOTE — Progress Notes (Signed)
Physical Therapy Session Note  Patient Details  Name: Zachary [name] MRN: [id number] Date of Birth: 06-11-1971  Today's Date: 04/06/2017 PT Individual Time: 7096-4383 PT Individual Time Calculation (min): 25 min   Short Term Goals: Week 1:  PT Short Term Goal 1 (Week 1): Pt will perform bed to chair transfer with min assist. PT Short Term Goal 1 - Progress (Week 1): Met PT Short Term Goal 2 (Week 1): Pt will ambulate 5 ft wtih mod assist of 1 with LRAD. PT Short Term Goal 2 - Progress (Week 1): Met PT Short Term Goal 3 (Week 1): Pt will stand x 2 min without LOB with min assist while performing functional UE activity. PT Short Term Goal 3 - Progress (Week 1): Met  Skilled Therapeutic Interventions/Progress Updates:   Pt in recliner upon arrival and agreeable to therapy, no c/o pain.   Worked on endurance and ambulation over uneven surfaces in outside environment. Ambulated from room to outside, >300' Mod I w/ RW and negotiated 8 steps outside. 1-2 seated rest breaks secondary to fatigue. Overall, spent ~20 min ambulating Mod I w/ RW while negotiating steps and uneven surfaces.   Ended session in recliner, call bell within reach and all needs met.   Therapy Documentation Precautions:  Precautions Precautions: Fall Precaution Comments:  (Left hemiparesis) Restrictions Weight Bearing Restrictions: No Vital Signs: Therapy Vitals Temp: 98.2 F (36.8 C) Temp Source: Oral Pulse Rate: 80 Resp: 16 BP: (!) 146/84 Patient Position (if appropriate): Sitting Oxygen Therapy SpO2: 99 % O2 Device: Not Delivered Pain: Pain Assessment Pain Assessment: No/denies pain Mobility: Bed Mobility Bed Mobility: Rolling Left;Rolling Right;Supine to Sit;Sit to Supine Rolling Right: 6: Modified independent (Device/Increase time) Rolling Left: 6: Modified independent (Device/Increase time) Supine to Sit: 6: Modified independent (Device/Increase time) Sit to Supine: 6: Modified independent  (Device/Increase time) Transfers Sit to Stand: 6: Modified independent (Device/Increase time) Stand to Sit: 6: Modified independent (Device/Increase time) Trunk/Postural Assessment : Cervical Assessment Cervical Assessment: Within Functional Limits Thoracic Assessment Thoracic Assessment: Within Functional Limits Lumbar Assessment Lumbar Assessment: Within Functional Limits  Balance: Balance Balance Assessed: Yes Dynamic Sitting Balance Dynamic Sitting - Balance Support: Feet supported Dynamic Sitting - Level of Assistance: 6: Modified independent (Device/Increase time) Dynamic Sitting Balance - Compensations: LB bathing/dressing , seated Static Standing Balance Static Standing - Balance Support: During functional activity;Left upper extremity supported Dynamic Standing Balance Dynamic Standing - Level of Assistance: 6: Modified independent (Device/Increase time)  See Function Navigator for Current Functional Status.   Therapy/Group: Individual Therapy  Marques Ericson K Arnette 04/06/2017, 4:48 PM

## 2017-04-06 NOTE — Discharge Summary (Signed)
Discharge summary job [id number]

## 2017-04-06 NOTE — Progress Notes (Signed)
Physical Therapy Session Note  Patient Details  Name: Zachary [name] MRN: [id number] Date of Birth: 06-23-1971  Today's Date: 04/06/2017 PT Individual Time: [phone] and [id number] PT Individual Time Calculation (min): 72 min and 30 min  Short Term Goals: Week 2:  PT Short Term Goal 1 (Week 2): = LTG due to ELOS  Skilled Therapeutic Interventions/Progress Updates:  Treatment 1: Pt received in room & agreeable to tx, denying c/o pain. Session focused on grad day activities with pt completing ambulation over even surface, bed mobility, and transfers with mod I. Pt requires supervision for stair negotiation with 1 rail, floor transfer, curb, ramp, and uneven surface negotiation with RW. Pt completed Berg Balance Test & scored [phone]; educated pt on interpretation of score & current fall risk. Patient demonstrates increased fall risk as noted by score of 48/56 on Berg Balance Scale.  (<36= high risk for falls, close to 100%; 37-45 significant >80%; 46-51 moderate >50%; 52-55 lower >25%). Pt also completed TUG without AD in 14 seconds.  Pt performed standing cone taps with min assist for balance with task focusing on L NMR and balance, pt with improving coordination of LLE as compared to previous dates. Pt completed lateral step ups on 6" step with LUE support and supervision then without UE support and min assist with task focusing on strengthening, NMR, and coordination. At end of session pt left sitting in recliner in room with all needs within reach.  BP at end of session = 129/88 mmHg (LUE, sitting), HR = 79 bpm    Treatment 2: Pt received in room & agreeable to tx, denying c/o pain. Pt ambulates room>gym>dayroom>room with RW & Mod I. Pt transferred onto mat in tall kneeling position while engaging in clothes pin activity with LUE with task focusing on balance, L NMR, and strengthening. Pt required rest break during activity 2/2 LLE fatigue. Pt utilized nu-step up to level 6 x 5 minutes with BLE  only with task focusing on strengthening. At end of session pt left sitting in recliner in room with all needs within reach.    Therapy Documentation Precautions:  Precautions Precautions: Fall Precaution Comments:  (Left hemiparesis) Restrictions Weight Bearing Restrictions: No    See Function Navigator for Current Functional Status.   Therapy/Group: Individual Therapy  [name] 04/06/2017, 11:39 AM

## 2017-04-06 NOTE — Discharge Summary (Signed)
Zachary [name], Zachary [name] NO.:  [id number]  MEDICAL RECORD NO.:  [id number]  LOCATION:                                 FACILITY:  PHYSICIAN:  Zachary [name], M.D.     DATE OF BIRTH:  DATE OF ADMISSION:  03/23/2017 DATE OF DISCHARGE:  04/07/2017                              DISCHARGE SUMMARY   DISCHARGE DIAGNOSES: 1. Right pontine small vessel disease infarction. 2. SCDs for deep venous thrombosis prophylaxis. 3. Pain management. 4. Hypertension. 5. Diabetes mellitus. 6. Hyperlipidemia. 7. Obesity. 8. Acute kidney injury.  HISTORY OF PRESENT ILLNESS:  This is a 46 year old right-handed male with history of obesity, hypertension.  He has not been taking his blood pressure meds for approximately 9 months because of financial reasons. Blood pressure 238/136 on admission.  He lives with his spouse, 2 children.  Presented March 18, 2017, with severe dizziness, left-sided weakness.  Cranial CT scan negative.  He did not receive tPA.  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  Urine drug screen negative. 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  Echocardiogram with ejection fraction of 55%, no wall motion abnormalities.  EEG negative. Placed on aspirin and Plavix therapy per Neurology Services.  Findings of hemoglobin A1c 8.8, diabetic coordinator consulted.  Intermittent bouts of headache, placed on Topamax.  The patient was admitted for a comprehensive rehab program.  PAST MEDICAL HISTORY:  See discharge diagnoses.  SOCIAL HISTORY:  Lives with spouse, 2 young children.  Functional status upon admission to rehab services was max assist, stand pivot transfers; max assist, ambulate 4 feet; mod max assist activities of daily living.  PHYSICAL EXAMINATION:  VITAL SIGNS:  Blood pressure 162/105, pulse  81, temperature 98.4, respirations 22. GENERAL:  Alert male, in no acute distress.  Oriented x3. EYES:  EOMs intact. NECK:  Supple.  Nontender.  No JVD. CARDIAC:  Rate controlled. ABDOMEN:  Soft, nontender.  Good bowel sounds. LUNGS:  Clear to auscultation without wheeze.  REHABILITATION HOSPITAL COURSE:  The patient was admitted to Inpatient Rehab Services.  Therapies initiated on a 3-hour daily basis consisting of physical therapy, occupational therapy, and rehabilitation nursing. The following issues were addressed during the patient's rehabilitation stay.  Pertaining to [name], right pontine infarction remained stable, maintained on aspirin and Plavix therapy.  He would follow up with Neurology Services.  SCDs for DVT prophylaxis.  Intermittent bouts of headache.  Doing well with the addition of Topamax.  Blood pressures controlled on hydrochlorothiazide, Tenormin, Norvasc.  Case management arranging follow up with a PCP.  Findings of hemoglobin A1c elevated at 8.1, maintained on Glucophage.  Full diabetic teaching.  Obesity with BMI of 32.28.  Dietary followup.  The patient received weekly collaborative interdisciplinary team conferences to discuss estimated length of stay, family teaching, any barriers to discharge.  Working with balance and strengthening, ambulate throughout the unit, rolling walker distance supervision, negotiating uneven surfaces, ramps, curbs, rolling walker supervision.  Completes car transfers with supervision. Negotiates 16 steps supervision.  It was discussed no driving.  He could  gather his belongings for activities of daily living and homemaking. Full family teaching was completed and planned discharge to home.  DISCHARGE MEDICATIONS: 1. Norvasc 10 mg p.o. daily. 2. Aspirin 81 mg p.o. daily. 3. Tenormin 12.5 mg p.o. daily. 4. Lipitor 20 mg p.o. daily. 5. Plavix 75 mg p.o. daily. 6. Hydrochlorothiazide 25 mg p.o. daily. 7. Glucophage 500 mg p.o.  b.i.d. 8. Topamax 50 mg p.o. b.i.d. 9. Tylenol as needed.  DIET:  Diabetic diet.  FOLLOWUP:  He would follow up with [name] at the outpatient rehab service office as advised; [name], Neurology Services, call for appointment.  Case management arranging follow up with PCP.  SPECIAL INSTRUCTIONS:  No driving.     Zachary [name], P.A.   ______________________________ Zachary [name], M.D.    DA/MEDQ  D:  04/06/2017  T:  04/06/2017  Job:  [id number]  cc:   Zachary P. Leonie Man, MD

## 2017-04-06 NOTE — Progress Notes (Addendum)
Physical Therapy Discharge Summary  Patient Details  Name: Zachary [name] MRN: [id number] Date of Birth: [birth year]  Today's Date: 04/06/2017   Patient has met 10 of 10 long term goals due to improved activity tolerance, improved balance, improved postural control, increased strength, ability to compensate for deficits, functional use of  left upper extremity and left lower extremity, improved attention, improved awareness and improved coordination.  Patient to discharge at an ambulatory level Modified Independent with RW.   Patient's care partner (wife, [name]) is independent to provide the necessary physical and cognitive assistance at discharge.  Reasons goals not met: n/a  Recommendation:  Patient will benefit from ongoing skilled PT services in home health setting to continue to advance safe functional mobility, address ongoing impairments in decreased dynamic balance, progress gait with LRAD, impaired coordination LUE/LLE, impaired neuromuscular control LUE/LLE, decreased endurance and strength, and minimize fall risk.  Equipment: RW  Reasons for discharge: treatment goals met  Patient/family agrees with progress made and goals achieved: Yes  PT Discharge Precautions/Restrictions Precautions Precautions: Fall Restrictions Weight Bearing Restrictions: No  Vision/Perception  Pt reports he does not wear glasses or contact at baseline. Pt denies changes in baseline vision.  Cognition Overall Cognitive Status: Within Functional Limits for tasks assessed Arousal/Alertness: Awake/alert Orientation Level: Oriented X4 Memory: Appears intact Awareness: Appears intact Safety/Judgment: Appears intact  Sensation Sensation Light Touch: Appears Intact (BLE, denies numbness & tingling in BLE) Coordination Gross Motor Movements are Fluid and Coordinated: Yes Fine Motor Movements are Fluid and Coordinated: No (impaired LUE, LLE)  Motor  Motor Motor: Hemiplegia Motor - Skilled  Clinical Observations: L hemiplegia, general weakness   Mobility Bed Mobility Bed Mobility: Rolling Left;Rolling Right;Supine to Sit;Sit to Supine (mat table) Rolling Right: 6: Modified independent (Device/Increase time) Rolling Left: 6: Modified independent (Device/Increase time) Supine to Sit: 6: Modified independent (Device/Increase time) Sit to Supine: 6: Modified independent (Device/Increase time) Transfers Transfers: Yes Sit to Stand: 6: Modified independent (Device/Increase time) Stand to Sit: 6: Modified independent (Device/Increase time)   Locomotion  Ambulation Ambulation: Yes Ambulation/Gait Assistance: 6: Modified independent (Device/Increase time) Ambulation Distance (Feet): 200 Feet Assistive device: Rolling walker Gait Gait velocity:  (decreased gait speed, impaired coordination LLE) High Level Ambulation High Level Ambulation: Backwards walking Backwards Walking: min assist without AD (on 8/22) Stairs / Additional Locomotion Stairs: Yes Stairs Assistance: 5: Supervision Stair Management Technique:  (1 rail) Number of Stairs: 16 Height of Stairs:  (6 inches) Ramp: 5: Supervision (with RW) Curb: 5: Supervision (with RW) Wheelchair Mobility Wheelchair Mobility: No   Balance Balance Balance Assessed: Yes Standardized Balance Assessment Standardized Balance Assessment: Berg Balance Test;Timed Up and Go Test  Berg Balance Test Sit to Stand: Able to stand without using hands and stabilize independently Standing Unsupported: Able to stand safely 2 minutes Sitting with Back Unsupported but Feet Supported on Floor or Stool: Able to sit safely and securely 2 minutes Stand to Sit: Sits safely with minimal use of hands Transfers: Able to transfer with verbal cueing and /or supervision Standing Unsupported with Eyes Closed: Able to stand 10 seconds with supervision Standing Ubsupported with Feet Together: Able to place feet together independently and stand 1 minute  safely From Standing, Reach Forward with Outstretched Arm: Can reach confidently >25 cm (10") From Standing Position, Pick up Object from Floor: Able to pick up shoe, needs supervision From Standing Position, Turn to Look Behind Over each Shoulder: Looks behind from both sides and weight shifts well Turn 360 Degrees: Able to  turn 360 degrees safely but slowly Standing Unsupported, Alternately Place Feet on Step/Stool: Able to complete 4 steps without aid or supervision (completes 8 steps in 11 seconds with supervision) Standing Unsupported, One Foot in Front: Able to place foot tandem independently and hold 30 seconds (with supervision) Standing on One Leg: Able to lift leg independently and hold > 10 seconds (standing on RLE) Total Score: 48  Timed Up and Go Test TUG: Normal TUG Normal TUG (seconds): 14   Extremity Assessment  RLE Assessment RLE Assessment: Within Functional Limits LLE Assessment LLE Assessment: Within Functional Limits LLE Strength LLE Overall Strength Comments: 4-/5 knee flexion   See Function Navigator for Current Functional Status.  [name], PT, DPT 04/06/2017, 11:42 AM  Loreen Freud, PT, DPT 04/06/2017, 4:50 PM

## 2017-04-06 NOTE — Progress Notes (Signed)
[place] PHYSICAL MEDICINE & REHABILITATION     PROGRESS NOTE  Subjective/Complaints:  No new complaints. Had a quiet night   ROS: pt denies nausea, vomiting, diarrhea, cough, shortness of breath or chest pain    Objective: Vital Signs: Blood pressure 129/88, pulse 79, temperature 98.5 F (36.9 C), temperature source Oral, resp. rate 15, height 5\' 10"  (1.778 m), weight 102.1 kg (225 lb), SpO2 100 %. No results found. No results for input(s): WBC, HGB, HCT, PLT in the last 72 hours. No results for input(s): NA, K, CL, GLUCOSE, BUN, CREATININE, CALCIUM in the last 72 hours.  Invalid input(s): CO CBG (last 3)   Recent Labs  04/05/17 1650 04/05/17 2115 04/06/17 0630  GLUCAP 88 96 103*    Wt Readings from Last 3 Encounters:  03/23/17 102.1 kg (225 lb)  03/18/17 102.1 kg (225 lb)    Physical Exam:  BP 129/88 (BP Location: Left Arm)   Pulse 79   Temp 98.5 F (36.9 C) (Oral)   Resp 15   Ht 5\' 10"  (1.778 m)   Wt 102.1 kg (225 lb)   SpO2 100%   BMI 32.28 kg/m  Constitutional: NAD. He appears well-developed and well-nourished.  HENT: Normocephalic. Atraumatic.  Eyes: EOMare normal. No discharge.  Cardiovascular: pulse in 80's. Reg rythm     . Respiratory: CTA Bilaterally without wheezes or rales. Normal effort   .  GI: Bowel sounds are normal. He exhibits no distension.  Neurological. Alert and oriented Mild facial droop Motor: RUE/RLE: 5/5 proximal to distal LUE: 4+/5 proximal to distal. LLE: 4+ to 5/5-improving Sensation 1++/2 Left arm and leg. Decreased FMC Skin. Warm and dry. Intact.  Psych: Normal mood and behavior.   Assessment/Plan: 1. Functional deficits secondary to right pontine small vessel infarct which require 3+ hours per day of interdisciplinary therapy in a comprehensive inpatient rehab setting. Physiatrist is providing close team supervision and 24 hour management of active medical problems listed below. Physiatrist and rehab team continue to  assess barriers to discharge/monitor patient progress toward functional and medical goals.  Function:  Bathing Bathing position   Position: Shower  Bathing parts Body parts bathed by patient: Front perineal area, Buttocks, Right upper leg, Left upper leg, Right arm, Left arm, Chest, Abdomen, Right lower leg, Left lower leg Body parts bathed by helper: Back  Bathing assist Assist Level: More than reasonable time      Upper Body Dressing/Undressing Upper body dressing   What is the patient wearing?: Pull over shirt/dress     Pull over shirt/dress - Perfomed by patient: Thread/unthread right sleeve, Thread/unthread left sleeve, Put head through opening, Pull shirt over trunk          Upper body assist Assist Level: More than reasonable time   Set up : To obtain clothing/put away  Lower Body Dressing/Undressing Lower body dressing   What is the patient wearing?: Pants, Socks, Shoes, Underwear Underwear - Performed by patient: Thread/unthread right underwear leg, Thread/unthread left underwear leg, Pull underwear up/down Underwear - Performed by helper: Pull underwear up/down Pants- Performed by patient: Thread/unthread right pants leg, Thread/unthread left pants leg, Pull pants up/down   Non-skid slipper socks- Performed by patient: Don/doff right sock, Don/doff left sock   Socks - Performed by patient: Don/doff right sock, Don/doff left sock   Shoes - Performed by patient: Don/doff right shoe, Don/doff left shoe, Fasten right, Fasten left Shoes - Performed by helper: Fasten right, Fasten left  Lower body assist Assist for lower body dressing: Supervision or verbal cues      Toileting Toileting Toileting activity did not occur: No continent bowel/bladder event Toileting steps completed by patient: Adjust clothing prior to toileting, Performs perineal hygiene, Adjust clothing after toileting Toileting steps completed by helper: Adjust clothing prior to  toileting Toileting Assistive Devices: Grab bar or rail  Toileting assist Assist level: More than reasonable time   Transfers Chair/bed transfer   Chair/bed transfer method: Ambulatory Chair/bed transfer assist level: No Help, no cues, assistive device, takes more than a reasonable amount of time Chair/bed transfer assistive device: Medical sales representative     Max distance: 200 ft  Assist level: No help, No cues, assistive device, takes more than a reasonable amount of time   Wheelchair Wheelchair activity did not occur: N/A Type: Manual Max wheelchair distance: 150 ft Assist Level: Supervision or verbal cues  Cognition Comprehension Comprehension assist level: Follows complex conversation/direction with no assist  Expression Expression assist level: Expresses complex ideas: With no assist  Social Interaction Social Interaction assist level: Interacts appropriately with others - No medications needed.  Problem Solving Problem solving assist level: Solves complex problems: Recognizes & self-corrects  Memory Memory assist level: Complete Independence: No helper    Medical Problem List and Plan: 1. Left hemiparesissecondary to right pontine small vessel infarct on 8/4  Cont CIR therapies   -using cane with PT  -dc tomorrow 2. DVT Prophylaxis/Anticoagulation: SCDs. Monitor for any signs of DVT 3. Pain Management/headaches: Topamax 50 mg twice a day-  4. Mood: Provide emotional support 5. Neuropsych: This patient iscapable of making decisions on hisown behalf. 6. Skin/Wound Care: Routine skin checks 7. Fluids/Electrolytes/Nutrition: Routine I&Os 8.Hypertension.   Currently maintained on HCTZ 25 mg daily, Norvasc 10 mg daily.   -tolerating low dose tenormin--HR in 80's now    9. Diabetes mellitus. Hemoglobin A1c 8.1. Glucophage 500 mg twice a day. Check blood sugars before meals and at bedtime. Diabetic teaching   controlled  10. Hyperlipidemia.  Lipitor 11.Obesity. BMI 32.28 kg/m2 12. Hyponatremia  Na+ 137 on 8/20  Cont to monitor 13. AKI  Cr 1.45 on 8/20  -  14. Hypokalemia  K+ up to 3.4 8/20  -continue to supp 15. Sleep disturbance  Trazodone 50 mg prn   LOS (Days) 14 A FACE TO FACE EVALUATION WAS PERFORMED  Shayn Madole T 04/06/2017 9:32 AM

## 2017-04-06 NOTE — Plan of Care (Signed)
Problem: RH BOWEL ELIMINATION Goal: RH STG MANAGE BOWEL WITH ASSISTANCE STG Manage Bowel with min Assistance.  Outcome: Progressing Last BM 04/05/2017

## 2017-04-07 ENCOUNTER — Encounter (HOSPITAL_COMMUNITY): Payer: Self-pay

## 2017-04-07 LAB — GLUCOSE, CAPILLARY
GLUCOSE-CAPILLARY: 101 mg/dL — AB (ref 65–99)
Glucose-Capillary: 96 mg/dL (ref 65–99)

## 2017-04-07 NOTE — Progress Notes (Signed)
Patient and family dicussed all the discharged instructions with Helyn Numbers ,PA before they left the hospital.

## 2017-04-07 NOTE — Progress Notes (Signed)
Social Work  Discharge Note  The overall goal for the admission was met for:   Discharge location: Yes - home with wife who will provide any needed assistance.  Length of Stay: Yes - 15 days  Discharge activity level: Yes - modified independent  Home/community participation: Yes  Services provided included: MD, RD, PT, OT, SLP, RN, TR, Pharmacy, Neuropsych and SW  Financial Services: None  Follow-up services arranged: Home Health: PT, OT via [place], DME: rolling walker and tub bench via [place], Other: referred pt to [facility] and [facility] and Patient/Family has no preference for HH/DME agencies  Comments (or additional information):  Patient/Family verbalized understanding of follow-up arrangements: Yes  Individual responsible for coordination of the follow-up plan: pt  Confirmed correct DME delivered: Hanish Laraia 04/07/2017    Jalyn Dutta

## 2017-04-07 NOTE — Discharge Instructions (Signed)
Inpatient Rehab Discharge Instructions  [place] Discharge date and time: No discharge date for patient encounter.   Activities/Precautions/ Functional Status: Activity: activity as tolerated Diet: diabetic diet Wound Care: none needed Functional status:  ___ No restrictions     ___ Walk up steps independently ___ 24/7 supervision/assistance   ___ Walk up steps with assistance ___ Intermittent supervision/assistance  ___ Bathe/dress independently ___ Walk with walker     _x__ Bathe/dress with assistance ___ Walk Independently    ___ Shower independently ___ Walk with assistance    ___ Shower with assistance ___ No alcohol     ___ Return to work/school ________    COMMUNITY REFERRALS UPON DISCHARGE:    Home Health:   PT    OT                      Agency:  [facility] Phone: [phone]   Medical Equipment/Items Ordered: rolling walker and tub bench                                                     Agency/Supplier: [facility] [phone]   GENERAL COMMUNITY RESOURCES FOR PATIENT/FAMILY:  Support Groups:  [facility] @ [place]                             2nd Thursday of the month @ 3:00 pm                             Meeting on the inpatient rehab unit                             Contact:  Shann Medal @ [phone]        Special Instructions: No driving [place] Cigarette smoking nearly doubles your risk of having a stroke & is the single most alterable risk factor  If you smoke or have smoked in the last 12 months, you are advised to quit smoking for your health.  Most of the excess cardiovascular risk related to smoking disappears within a year of stopping.  Ask you doctor about anti-smoking medications  [place] Quit Line: 1-800-QUIT NOW  Free Smoking Cessation Classes [phone]  CHOLESTEROL Know your levels; limit fat & cholesterol in your diet  Lipid Panel     Component Value Date/Time   CHOL  133 03/19/2017 0211   TRIG 67 03/19/2017 0211   HDL 40 (L) 03/19/2017 0211   CHOLHDL 3.3 03/19/2017 0211   VLDL 13 03/19/2017 0211   LDLCALC 80 03/19/2017 0211      Many patients benefit from treatment even if their cholesterol is at goal.  Goal: Total Cholesterol (CHOL) less than 160  Goal:  Triglycerides (TRIG) less than 150  Goal:  HDL greater than 40  Goal:  LDL (LDLCALC) less than 100   BLOOD PRESSURE American Stroke Association blood pressure target is less that 120/80 mm/Hg  Your discharge blood pressure is:  BP: (!) 175/106  Monitor your blood pressure  Limit your salt and alcohol intake  Many individuals will require more than one medication for high blood pressure  DIABETES (A1c is a blood  sugar average for last 3 months) Goal HGBA1c is under 7% (HBGA1c is blood sugar average for last 3 months)  Diabetes:    Lab Results  Component Value Date   HGBA1C 8.1 (H) 03/19/2017     Your HGBA1c can be lowered with medications, healthy diet, and exercise.  Check your blood sugar as directed by your physician  Call your physician if you experience unexplained or low blood sugars.  PHYSICAL ACTIVITY/REHABILITATION Goal is 30 minutes at least 4 days per week  Activity: Increase activity slowly, Therapies: Physical Therapy: Home Health Return to work:   Activity decreases your risk of heart attack and stroke and makes your heart stronger.  It helps control your weight and blood pressure; helps you relax and can improve your mood.  Participate in a regular exercise program.  Talk with your doctor about the best form of exercise for you (dancing, walking, swimming, cycling).  DIET/WEIGHT Goal is to maintain a healthy weight  Your discharge diet is: Diet heart healthy/carb modified Room service appropriate? Yes; Fluid consistency: Thin  liquids Your height is:    Your current weight is:   Your Body Mass Index (BMI) is:     Following the type of diet specifically designed  for you will help prevent another stroke.  Your goal weight range is:    Your goal Body Mass Index (BMI) is 19-24.  Healthy food habits can help reduce 3 risk factors for stroke:  High cholesterol, hypertension, and excess weight.  RESOURCES Stroke/Support Group:  Call [phone]   STROKE EDUCATION PROVIDED/REVIEWED AND GIVEN TO PATIENT Stroke warning signs and symptoms How to activate emergency medical system (call 911). Medications prescribed at discharge. Need for follow-up after discharge. Personal risk factors for stroke. Pneumonia vaccine given:  Flu vaccine given:  My questions have been answered, the writing is legible, and I understand these instructions.  I will adhere to these goals & educational materials that have been provided to me after my discharge from the hospital.      My questions have been answered and I understand these instructions. I will adhere to these goals and the provided educational materials after my discharge from the hospital.  Patient/Caregiver Signature _______________________________ Date __________  Clinician Signature _______________________________________ Date __________  Please bring this form and your medication list with you to all your follow-up doctor's appointments.

## 2017-04-07 NOTE — Progress Notes (Signed)
[facility] PHYSICAL MEDICINE & REHABILITATION     PROGRESS NOTE  Subjective/Complaints:  Up in chair. Excited to go home  ROS: pt denies nausea, vomiting, diarrhea, cough, shortness of breath or chest pain    Objective: Vital Signs: Blood pressure 129/82, pulse 73, temperature 98.5 F (36.9 C), temperature source Oral, resp. rate 16, height 5\' 10"  (1.778 m), weight 102.1 kg (225 lb), SpO2 98 %. No results found. No results for input(s): WBC, HGB, HCT, PLT in the last 72 hours. No results for input(s): NA, K, CL, GLUCOSE, BUN, CREATININE, CALCIUM in the last 72 hours.  Invalid input(s): CO CBG (last 3)   Recent Labs  04/06/17 1640 04/06/17 2047 04/07/17 0616  GLUCAP 131* 81 101*    Wt Readings from Last 3 Encounters:  03/23/17 102.1 kg (225 lb)  03/18/17 102.1 kg (225 lb)    Physical Exam:  BP 129/82 (BP Location: Right Arm)   Pulse 73   Temp 98.5 F (36.9 C) (Oral)   Resp 16   Ht 5\' 10"  (1.778 m)   Wt 102.1 kg (225 lb)   SpO2 98%   BMI 32.28 kg/m  Constitutional: NAD. He appears well-developed and well-nourished.  HENT: Normocephalic. Atraumatic.  Eyes: EOMare normal. No discharge.  Cardiovascular: RRR     . Respiratory: CTA Bilaterally without wheezes or rales. Normal effort  GI: Bowel sounds are normal. He exhibits no distension.  Neurological. Alert and oriented Mild facial droop Motor: RUE/RLE: 5/5 proximal to distal LUE: 4+/5 proximal to distal. LLE: 4+ to 5/5-improving Sensation 1++/2 Left arm and leg. Decreased FMC Skin. Warm and dry. Intact.  Psych: Normal mood and behavior.   Assessment/Plan: 1. Functional deficits secondary to right pontine small vessel infarct which require 3+ hours per day of interdisciplinary therapy in a comprehensive inpatient rehab setting. Physiatrist is providing close team supervision and 24 hour management of active medical problems listed below. Physiatrist and rehab team continue to assess barriers to  discharge/monitor patient progress toward functional and medical goals.  Function:  Bathing Bathing position   Position: Shower  Bathing parts Body parts bathed by patient: Front perineal area, Buttocks, Right upper leg, Left upper leg, Right arm, Left arm, Chest, Abdomen, Right lower leg, Left lower leg Body parts bathed by helper: Back  Bathing assist Assist Level: More than reasonable time      Upper Body Dressing/Undressing Upper body dressing   What is the patient wearing?: Pull over shirt/dress     Pull over shirt/dress - Perfomed by patient: Thread/unthread right sleeve, Thread/unthread left sleeve, Put head through opening, Pull shirt over trunk          Upper body assist Assist Level: More than reasonable time   Set up : To obtain clothing/put away  Lower Body Dressing/Undressing Lower body dressing   What is the patient wearing?: Pants, Socks, Shoes, Underwear Underwear - Performed by patient: Thread/unthread right underwear leg, Thread/unthread left underwear leg, Pull underwear up/down Underwear - Performed by helper: Pull underwear up/down Pants- Performed by patient: Thread/unthread right pants leg, Thread/unthread left pants leg, Pull pants up/down   Non-skid slipper socks- Performed by patient: Don/doff right sock, Don/doff left sock   Socks - Performed by patient: Don/doff right sock, Don/doff left sock   Shoes - Performed by patient: Don/doff right shoe, Don/doff left shoe, Fasten right, Fasten left Shoes - Performed by helper: Fasten right, Fasten left          Lower body assist Assist for lower  body dressing: More than reasonable time      Toileting Toileting Toileting activity did not occur: No continent bowel/bladder event Toileting steps completed by patient: Adjust clothing prior to toileting, Performs perineal hygiene, Adjust clothing after toileting Toileting steps completed by helper: Adjust clothing prior to toileting [place]: Grab bar or rail  Toileting assist Assist level: More than reasonable time   Transfers Chair/bed transfer   Chair/bed transfer method: Ambulatory Chair/bed transfer assist level: No Help, no cues, assistive device, takes more than a reasonable amount of time Chair/bed transfer assistive device: Medical sales representative     Max distance: 300+ ft Assist level: No help, No cues, assistive device, takes more than a reasonable amount of time   Wheelchair Wheelchair activity did not occur: N/A Type: Manual Max wheelchair distance: 150 ft Assist Level: Supervision or verbal cues  Cognition Comprehension Comprehension assist level: Follows complex conversation/direction with no assist  Expression Expression assist level: Expresses complex ideas: With no assist  Social Interaction Social Interaction assist level: Interacts appropriately with others - No medications needed.  Problem Solving Problem solving assist level: Solves complex problems: Recognizes & self-corrects  Memory Memory assist level: Complete Independence: No helper    Medical Problem List and Plan: 1. 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  2. DVT Prophylaxis/Anticoagulation: SCDs. Monitor for any signs of DVT 3. Pain Management/headaches: Topamax 50 mg twice a day-  4. Mood: Provide emotional support 5. Neuropsych: This patient iscapable of making decisions on hisown behalf. 6. Skin/Wound Care: Routine skin checks 7. Fluids/Electrolytes/Nutrition: Routine I&Os 8.Hypertension.   Currently maintained on HCTZ 25 mg daily, Norvasc 10 mg daily.   -tolerating low dose tenormin--HR in 80's now    9. Diabetes mellitus. Hemoglobin A1c 8.1. Glucophage 500 mg twice a day. Check blood sugars before meals and at bedtime. Diabetic teaching    controlled  10. Hyperlipidemia. Lipitor 11.Obesity. BMI 32.28 kg/m2 12. Hyponatremia  Na+ 137 on 8/20  Cont to monitor 13. AKI  Cr 1.45 on 8/20  -  14. Hypokalemia  K+ up to 3.4 8/20  -continue to supp 15. Sleep disturbance  Trazodone 50 mg prn   LOS (Days) 15 A FACE TO FACE EVALUATION WAS PERFORMED  Dois Juarbe T 04/07/2017 9:26 AM

## 2017-04-10 ENCOUNTER — Telehealth: Payer: Self-pay | Admitting: *Deleted

## 2017-04-10 NOTE — Telephone Encounter (Signed)
Transitional Care call-1st attempt 10:22 04/10/17 left message on home VM with appt info and request to call office.   Appointment 04/12/17 Wednesday arrive 10:00 for 10:20 with Dr Naaman Plummer [address]

## 2017-04-12 ENCOUNTER — Encounter: Payer: Medicaid Other | Attending: Physical Medicine & Rehabilitation | Admitting: Physical Medicine & Rehabilitation

## 2017-04-12 ENCOUNTER — Encounter: Payer: Self-pay | Admitting: Physical Medicine & Rehabilitation

## 2017-04-12 VITALS — BP 139/84 | HR 79 | Resp 14

## 2017-04-12 DIAGNOSIS — I6350 Cerebral infarction due to unspecified occlusion or stenosis of unspecified cerebral artery: Secondary | ICD-10-CM | POA: Diagnosis present

## 2017-04-12 DIAGNOSIS — E785 Hyperlipidemia, unspecified: Secondary | ICD-10-CM | POA: Insufficient documentation

## 2017-04-12 DIAGNOSIS — E119 Type 2 diabetes mellitus without complications: Secondary | ICD-10-CM | POA: Insufficient documentation

## 2017-04-12 DIAGNOSIS — I69354 Hemiplegia and hemiparesis following cerebral infarction affecting left non-dominant side: Secondary | ICD-10-CM

## 2017-04-12 DIAGNOSIS — G8194 Hemiplegia, unspecified affecting left nondominant side: Secondary | ICD-10-CM | POA: Insufficient documentation

## 2017-04-12 DIAGNOSIS — I10 Essential (primary) hypertension: Secondary | ICD-10-CM | POA: Diagnosis not present

## 2017-04-12 MED ORDER — TOPIRAMATE 50 MG PO TABS: 50.0000 mg | ORAL_TABLET | Freq: Every day | ORAL | 0 refills | Status: DC

## 2017-04-12 MED ORDER — BLOOD GLUCOSE MONITOR KIT: PACK | 0 refills | Status: AC

## 2017-04-12 NOTE — Progress Notes (Signed)
Subjective:    Patient ID: Zachary [name], male    DOB: Jun 17, 1971, 46 y.o.   MRN: [id number]  HPI  Mr. Buelow is here for a transitional care  follow up visit of his right pontine infarct. He's been home for several days and has been doing well so far. He is utilizing his RW without any issues. He denies falls or LOB. His headaches have been better. He remains on topamax 50mg  bid currently. Bowels and bladder have been moving regularly.   He is not che king his sugars or BP. He was not sent home with a glucometer or test strips.   Sleep is good. He denies depression and remains up beat. famliy is supportive.  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 General activity 5 Relation with others 4 Enjoyment of life 2 What TIME of day is your pain at its worst? varies Sleep (in general) Fair  Pain is worse with: unsure Pain improves with: rest Relief from Meds: 0  Mobility walk with assistance use a walker how many minutes can you walk? 5 ability to climb steps?  yes do you drive?  no Do you have any goals in this area?  yes  Function employed # of hrs/week not working currently I need assistance with the following:  meal prep and household duties  Neuro/Psych numbness trouble walking  Prior Studies hospital f/u  Physicians involved in your care hospital f/u   History reviewed. No pertinent family history. Social History   Social History  . Marital status: Married    Spouse name: N/A  . Number of children: N/A  . Years of education: N/A   Social History Main Topics  . Smoking status: Never Smoker  . Smokeless tobacco: Never Used  . Alcohol use No  . Drug use: No  . Sexual activity: No   Other Topics Concern  . None   Social History Narrative  . None   History  reviewed. No pertinent surgical history. Past Medical History:  Diagnosis Date  . Hypertension    BP 139/84 (BP Location: Left Arm, Patient Position: Sitting, Cuff Size: Normal)   Pulse 79   Resp 14   SpO2 95%   Opioid Risk Score:   Fall Risk Score:  `1  Depression screen PHQ 2/9  No flowsheet data found.  Review of Systems  Constitutional: Negative.   HENT: Negative.   Eyes: Negative.   Respiratory: Negative.   Cardiovascular: Negative.   Gastrointestinal: Negative.   Endocrine: Negative.   Genitourinary: Negative.   Musculoskeletal: Positive for gait problem.  Skin: Negative.   Allergic/Immunologic: Negative.   Neurological: Positive for numbness.  Hematological: Negative.   Psychiatric/Behavioral: Negative.   All other systems reviewed and are negative.      Objective:   Physical Exam  Constitutional: NAD. He appears well-developed and well-nourished.  HENT: Normocephalic. Atraumatic.  Eyes: EOMare normal. No discharge.  Cardiovascular: RRR     . Respiratory: CTA Bilaterally without wheezes or rales. Normal effort  GI: Bowel sounds are normal. He exhibits no distension.  Neurological. Alert and oriented Mild facial droop Motor: RUE/RLE: 5/5 proximal to distal LUE: 4+/5 proximal to distal. LLE: 4+ to 5/5-improving Sensation 1++/2 Left arm and leg. Decreased FMC Skin. Warm and dry. Intact.  Psych: Normal mood and behavior.       Assessment & Plan:  1. 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 Should be able to advance to cane soon 2.  Pain Management/headaches: Topamax 50 mg twice a day- reduce to 50mg  qhs for one week. If no headaches he may stop completely.  4. Mood: positive at this point. Family supportive 5.  Hypertension.              Currently maintained on HCTZ 25 mg daily, Norvasc 10 mg daily.              --needs to have potassium level and  BUN/Cr labs checked at [facility]               9. Diabetes mellitus. Hemoglobin A1c 8.1. Glucophage 500 mg twice a day.   -home glucometer/strips provided   -stressed the importance of glucose control as it pertains to his stroke risk 10. Hyperlipidemia. Lipitor   Thirty minutes of face to face patient care time were spent during this visit. All questions were encouraged and answered. Follow up with me in about 2 months.

## 2017-04-12 NOTE — Telephone Encounter (Signed)
Patient seen today in clinic.

## 2017-04-12 NOTE — Patient Instructions (Signed)
DECREASE TO TOPAMAX TO 50MG  AT BEDTIME. CONTINUE FOR 1 WEEK. IF NO PROBLEMS, YOU CAN STOP ENTIRELY AFTER ONE WEEK   CHECK SUGARS 4 X DAILY FOR FIRST WEEK THEN IF THEY ARE STABLE, YOU CAN CHANGE TO ONCE/TWICE DAILY IN A STAGGERED FASHION.    PLEASE FEEL FREE TO CALL OUR OFFICE WITH ANY PROBLEMS OR QUESTIONS ([phone])

## 2017-04-14 ENCOUNTER — Ambulatory Visit: Payer: Medicaid Other | Attending: Internal Medicine | Admitting: Physician Assistant

## 2017-04-14 VITALS — BP 128/83 | HR 82 | Temp 98.4°F | Resp 18 | Ht 70.0 in | Wt 202.0 lb

## 2017-04-14 DIAGNOSIS — E785 Hyperlipidemia, unspecified: Secondary | ICD-10-CM

## 2017-04-14 DIAGNOSIS — E669 Obesity, unspecified: Secondary | ICD-10-CM | POA: Diagnosis not present

## 2017-04-14 DIAGNOSIS — Z7982 Long term (current) use of aspirin: Secondary | ICD-10-CM | POA: Insufficient documentation

## 2017-04-14 DIAGNOSIS — Z7984 Long term (current) use of oral hypoglycemic drugs: Secondary | ICD-10-CM | POA: Diagnosis not present

## 2017-04-14 DIAGNOSIS — I639 Cerebral infarction, unspecified: Secondary | ICD-10-CM | POA: Diagnosis not present

## 2017-04-14 DIAGNOSIS — I10 Essential (primary) hypertension: Secondary | ICD-10-CM

## 2017-04-14 DIAGNOSIS — E1151 Type 2 diabetes mellitus with diabetic peripheral angiopathy without gangrene: Secondary | ICD-10-CM | POA: Insufficient documentation

## 2017-04-14 DIAGNOSIS — I69354 Hemiplegia and hemiparesis following cerebral infarction affecting left non-dominant side: Secondary | ICD-10-CM

## 2017-04-14 DIAGNOSIS — E119 Type 2 diabetes mellitus without complications: Secondary | ICD-10-CM

## 2017-04-14 DIAGNOSIS — Z79899 Other long term (current) drug therapy: Secondary | ICD-10-CM | POA: Diagnosis not present

## 2017-04-14 DIAGNOSIS — R42 Dizziness and giddiness: Secondary | ICD-10-CM | POA: Diagnosis not present

## 2017-04-14 LAB — GLUCOSE, POCT (MANUAL RESULT ENTRY): POC Glucose: 102 mg/dl — AB (ref 70–99)

## 2017-04-14 MED ORDER — TOPIRAMATE 50 MG PO TABS: 50.0000 mg | ORAL_TABLET | Freq: Every day | ORAL | 2 refills | Status: DC

## 2017-04-14 MED ORDER — HYDROCHLOROTHIAZIDE 25 MG PO TABS: 25.0000 mg | ORAL_TABLET | Freq: Every day | ORAL | 2 refills | Status: DC

## 2017-04-14 MED ORDER — GLUCOSE BLOOD VI STRP: ORAL_STRIP | 12 refills | Status: DC

## 2017-04-14 MED ORDER — AMLODIPINE BESYLATE 10 MG PO TABS: 10.0000 mg | ORAL_TABLET | Freq: Every day | ORAL | 3 refills | Status: DC

## 2017-04-14 MED ORDER — BD ULTRA-FINE LANCETS MISC: 12 refills | Status: DC

## 2017-04-14 MED ORDER — TRUE METRIX METER W/DEVICE KIT
1.0000 | PACK | Freq: Three times a day (TID) | 0 refills | Status: DC
Start: 2017-04-14 — End: 2024-02-07

## 2017-04-14 MED ORDER — METFORMIN HCL 500 MG PO TABS: 500.0000 mg | ORAL_TABLET | Freq: Two times a day (BID) | ORAL | 2 refills | Status: DC

## 2017-04-14 MED ORDER — ATENOLOL 25 MG PO TABS: 12.5000 mg | ORAL_TABLET | Freq: Every day | ORAL | 2 refills | Status: DC

## 2017-04-14 MED ORDER — ASPIRIN 81 MG PO TBEC: 81.0000 mg | DELAYED_RELEASE_TABLET | Freq: Every day | ORAL | 2 refills | Status: DC

## 2017-04-14 MED ORDER — POTASSIUM CHLORIDE CRYS ER 20 MEQ PO TBCR: 20.0000 meq | EXTENDED_RELEASE_TABLET | Freq: Every day | ORAL | 2 refills | Status: DC

## 2017-04-14 MED ORDER — CLOPIDOGREL BISULFATE 75 MG PO TABS: 75.0000 mg | ORAL_TABLET | Freq: Every day | ORAL | 2 refills | Status: DC

## 2017-04-14 MED ORDER — ATORVASTATIN CALCIUM 20 MG PO TABS: 20.0000 mg | ORAL_TABLET | Freq: Every day | ORAL | 2 refills | Status: DC

## 2017-04-14 MED FILL — TRUE METRIX TEST STRIP: 30 days supply | Qty: 100 | Fill #0

## 2017-04-14 MED FILL — !TRUE METRIX BLOOD GLUCOSE: 365 days supply | Qty: 1 | Fill #0

## 2017-04-14 MED FILL — TRUEplus LANCETS 28G MISC: 30 days supply | Qty: 100 | Fill #0

## 2017-04-14 NOTE — Progress Notes (Signed)
Alani Lacivita  QPY:[id number]  TIW:[id number]  DOB - 02-21-1971  Chief Complaint  Patient presents with  . Hospitalization Follow-up    CVA       Subjective:   Koji Niehoff is a 46 y.o. male here today for Establishment of care. He has a history of hypertension and obesity. He presented to the emergency department on 03/18/2017 after an episode of dizziness, difficulty walking and left-sided numbness. He had been out of his blood pressure medications for at least 9 months and has not seen a primary care provider in years. His systolic blood pressure on presentation was 238. After this improved he did receive TPA. He was not a candidate for thrombectomy. Imaging didn't suggest a stroke. Neurology assisted with his admission. His blood pressure was aggressively controlled. His LDL was 80. His A1c was 8.1. His echocardiogram showed normal LV function without PFO. He was then transitioned to inpatient rehabilitation on 03/22/2017 through 04/06/2017.   He has since had a visit from home health physical therapy. Occupational therapy is due to see him next week. He is walking with a cane and also uses a rolling walker at times. He is not driving. No chest pain. No headaches. Compliant with his medications. Nonsmoker. Does not have a BP cuff or sugar meter.   ROS: GEN: denies fever or chills, denies change in weight Skin: denies lesions or rashes HEENT: denies headache, earache, epistaxis, sore throat, or neck pain LUNGS: denies SHOB, dyspnea, PND, orthopnea CV: denies CP or palpitations ABD: denies abd pain, N or V EXT: denies muscle spasms or swelling; no pain in lower ext, no weakness NEURO: denies numbness or tingling, + sz, stroke or TIA   ALLERGIES: No Known Allergies  PAST MEDICAL HISTORY: Past Medical History:  Diagnosis Date  . Hypertension     PAST SURGICAL HISTORY: History reviewed. No pertinent surgical history.  MEDICATIONS AT HOME: Prior to Admission medications     Medication Sig Start Date End Date Taking? Authorizing Provider  amLODipine (NORVASC) 10 MG tablet Take 1 tablet (10 mg total) by mouth daily. 05/05/17  Yes Brayton Caves, PA-C  aspirin 81 MG EC tablet Take 1 tablet (81 mg total) by mouth at bedtime. 05/05/17  Yes Ena Dawley, Ibrohim Simmers S, PA-C  atenolol (TENORMIN) 25 MG tablet Take 0.5 tablets (12.5 mg total) by mouth daily. 04/14/17  Yes Ena Dawley, Jairon Ripberger S, PA-C  atorvastatin (LIPITOR) 20 MG tablet Take 1 tablet (20 mg total) by mouth daily at 6 PM. 05/05/17  Yes Ena Dawley, Vianka Ertel S, PA-C  blood glucose meter kit and supplies KIT Dispense based on patient and insurance preference. Use up to four times daily as directed. (FOR ICD-9 250.00, 250.01). 04/12/17  Yes Meredith Staggers, MD  clopidogrel (PLAVIX) 75 MG tablet Take 1 tablet (75 mg total) by mouth daily. 05/05/17  Yes Ena Dawley, Krissie Merrick S, PA-C  hydrochlorothiazide (HYDRODIURIL) 25 MG tablet Take 1 tablet (25 mg total) by mouth daily. 05/05/17  Yes Ena Dawley, Jacqueline Spofford S, PA-C  metFORMIN (GLUCOPHAGE) 500 MG tablet Take 1 tablet (500 mg total) by mouth 2 (two) times daily with a meal. 05/05/17  Yes Ena Dawley, Shanna Un S, PA-C  potassium chloride SA (K-DUR,KLOR-CON) 20 MEQ tablet Take 1 tablet (20 mEq total) by mouth daily. 05/05/17  Yes Ena Dawley, Elisha Cooksey S, PA-C  topiramate (TOPAMAX) 50 MG tablet Take 1 tablet (50 mg total) by mouth at bedtime. 05/05/17  Yes Brayton Caves, PA-C   Family +HTN, no CVA or CAD  Social-married, works in  a call center, nonsmoker  Objective:   Vitals:   04/14/17 1410  BP: 128/83  Pulse: 82  Resp: 18  Temp: 98.4 F (36.9 C)  TempSrc: Oral  SpO2: 100%  Weight: 202 lb (91.6 kg)  Height: 5' 10"  (1.778 m)    Exam General appearance : Awake, alert, not in any distress. Speech Clear. Not toxic looking HEENT: Atraumatic and Normocephalic, pupils equally reactive to light and accomodation Neck: supple, no JVD. No cervical lymphadenopathy.  Chest:Good air entry bilaterally, no added sounds  CVS: S1  S2 regular, no murmurs.  Abdomen: Bowel sounds present, Non tender and not distended with no guarding, rigidity or rebound. Extremities: B/L Lower Ext shows no edema, both legs are warm to touch Neurology: Awake alert, and oriented X 3, CN II-XII intact, Non focal; barely weaker 4/5 on LUE and LLE Skin:No Rash Wounds:N/A  Data Review Lab Results  Component Value Date   HGBA1C 8.1 (H) 03/19/2017     Assessment & Plan  1. CVA  -RF modification  -DAPT  -Neuro follow up as scheduled  -Cont HHPT/OT  -rehab follow up as scheduled 2. HTN  -Refilled antihypertensives  -BMP today  -DASH diet 3. DM2  -refilled Metformin  -diabetes education referral  -meter/kit provided; bring in to next appt for review  4. PVD  -RF modification  Return in about 3 weeks (around 05/05/2017).  The patient was given clear instructions to go to ER or return to medical center if symptoms don't improve, worsen or new problems develop. The patient verbalized understanding. The patient was told to call to get lab results if they haven't heard anything in the next week.   Total time spent with patient was 22 min. Greater than 50 % of this visit was spent face to face counseling and coordinating care regarding risk factor modification, compliance importance and encouragement, education related to CVA, diabetes and BP.  This note has been created with Surveyor, quantity. Any transcriptional errors are unintentional.    Zettie Pho, PA-C [facility] [place], [place]   04/14/2017, 2:28 PM

## 2017-04-14 NOTE — Patient Instructions (Signed)
Aim for 30 minutes of exercise most days. Rethink what you drink. Water is great! Aim for 2-3 Carb Choices per meal (30-45 grams) +/- 1 either way  Aim for 0-15 Carbs per snack if hungry  Include protein in moderation with your meals and snacks  Consider reading food labels for Total Carbohydrate and Fat Grams of foods  Consider checking BG at alternate times per day  Continue taking medication as directed Be mindful about how much sugar you are adding to beverages and other foods. Fruit Punch - find one with no sugar  Measure and decrease portions of carbohydrate foods  Make your plate and don't go back for seconds  

## 2017-04-15 LAB — BASIC METABOLIC PANEL
BUN/Creatinine Ratio: 14 (ref 9–20)
BUN: 20 mg/dL (ref 6–24)
CO2: 23 mmol/L (ref 20–29)
CREATININE: 1.38 mg/dL — AB (ref 0.76–1.27)
Calcium: 9.8 mg/dL (ref 8.7–10.2)
Chloride: 97 mmol/L (ref 96–106)
GFR calc Af Amer: 69 mL/min/{1.73_m2} (ref >59)
GFR, EST NON AFRICAN AMERICAN: 60 mL/min/{1.73_m2} (ref >59)
Glucose: 98 mg/dL (ref 65–99)
Potassium: 3.8 mmol/L (ref 3.5–5.2)
SODIUM: 138 mmol/L (ref 134–144)

## 2017-04-19 ENCOUNTER — Telehealth: Payer: Self-pay | Admitting: *Deleted

## 2017-04-19 NOTE — Telephone Encounter (Signed)
Wayne Both, OT, [facility] left a message asking for verbal orders fot home health OT 2week3.  I called back and gave verbal orders per office protocol

## 2017-04-26 DIAGNOSIS — I10 Essential (primary) hypertension: Secondary | ICD-10-CM

## 2017-04-26 DIAGNOSIS — Z7902 Long term (current) use of antithrombotics/antiplatelets: Secondary | ICD-10-CM

## 2017-04-26 DIAGNOSIS — I69354 Hemiplegia and hemiparesis following cerebral infarction affecting left non-dominant side: Secondary | ICD-10-CM

## 2017-04-26 DIAGNOSIS — E1165 Type 2 diabetes mellitus with hyperglycemia: Secondary | ICD-10-CM

## 2017-04-26 DIAGNOSIS — E785 Hyperlipidemia, unspecified: Secondary | ICD-10-CM

## 2017-05-03 ENCOUNTER — Telehealth: Payer: Self-pay | Admitting: *Deleted

## 2017-05-03 NOTE — Telephone Encounter (Signed)
Wayne Both, OT, [facility] left a message reporting  Elevated blood pressure 142/92. She then added that patient reports home B/P readings are trending back down....Marland Kitchenfyi

## 2017-05-11 ENCOUNTER — Ambulatory Visit: Payer: Self-pay | Attending: Family Medicine | Admitting: Family Medicine

## 2017-05-11 ENCOUNTER — Encounter: Payer: Self-pay | Admitting: Family Medicine

## 2017-05-11 VITALS — BP 122/83 | HR 86 | Temp 99.0°F | Resp 18 | Ht 70.0 in | Wt 198.8 lb

## 2017-05-11 DIAGNOSIS — I6930 Unspecified sequelae of cerebral infarction: Secondary | ICD-10-CM | POA: Insufficient documentation

## 2017-05-11 DIAGNOSIS — E119 Type 2 diabetes mellitus without complications: Secondary | ICD-10-CM | POA: Insufficient documentation

## 2017-05-11 DIAGNOSIS — I10 Essential (primary) hypertension: Secondary | ICD-10-CM | POA: Insufficient documentation

## 2017-05-11 DIAGNOSIS — Z7984 Long term (current) use of oral hypoglycemic drugs: Secondary | ICD-10-CM | POA: Insufficient documentation

## 2017-05-11 LAB — POCT GLYCOSYLATED HEMOGLOBIN (HGB A1C): Hemoglobin A1C: 7

## 2017-05-11 LAB — GLUCOSE, POCT (MANUAL RESULT ENTRY): POC GLUCOSE: 162 mg/dL — AB (ref 70–99)

## 2017-05-11 NOTE — Patient Instructions (Addendum)
Apply for orange card.  Type 2 Diabetes Mellitus, Self Care, Adult When you have type 2 diabetes (type 2 diabetes mellitus), you must keep your blood sugar (glucose) under control. You can do this with:  Nutrition.  Exercise.  Lifestyle changes.  Medicines or insulin, if needed.  Support from your doctors and others.  How do I manage my blood sugar?  Check your blood sugar level every day, as often as told.  Call your doctor if your blood sugar is above your goal numbers for 2 tests in a row.  Have your A1c (hemoglobin A1c) level checked at least two times a year. Have it checked more often if your doctor tells you to. Your doctor will set treatment goals for you. Generally, you should have these blood sugar levels:  Before meals (preprandial): 80-130 mg/dL (4.4-7.2 mmol/L).  After meals (postprandial): lower than 180 mg/dL (10 mmol/L).  A1c level: less than 7%.  What do I need to know about high blood sugar? High blood sugar is called hyperglycemia. Know the signs of high blood sugar. Signs may include:  Feeling: ? Thirsty. ? Hungry. ? Very tired.  Needing to pee (urinate) more than usual.  Blurry vision.  What do I need to know about low blood sugar? Low blood sugar is called hypoglycemia. This is when blood sugar is at or below 70 mg/dL (3.9 mmol/L). Symptoms may include:  Feeling: ? Hungry. ? Worried or nervous (anxious). ? Sweaty and clammy. ? Confused. ? Dizzy. ? Sleepy. ? Sick to your stomach (nauseous).  Having: ? A fast heartbeat (palpitations). ? A headache. ? A change in your vision. ? Jerky movements that you cannot control (seizure). ? Nightmares. ? Tingling or no feeling (numbness) around the mouth, lips, or tongue.  Having trouble with: ? Talking. ? Paying attention (concentrating). ? Moving (coordination). ? Sleeping.  Shaking.  Passing out (fainting).  Getting upset easily (irritability).  Treating low blood sugar  To treat  low blood sugar, eat or drink something sugary right away. If you can think clearly and swallow safely, follow the 15:15 rule:  Take 15 grams of a fast-acting carb (carbohydrate). Some fast-acting carbs are: ? 1 tube of glucose gel. ? 3 sugar tablets (glucose pills). ? 6-8 pieces of hard candy. ? 4 oz (120 mL) of fruit juice. ? 4 oz (120 mL) regular (not diet) soda.  Check your blood sugar 15 minutes after you take the carb.  If your blood sugar is still at or below 70 mg/dL (3.9 mmol/L), take 15 grams of a carb again.  If your blood sugar does not go above 70 mg/dL (3.9 mmol/L) after 3 tries, get help right away.  After your blood sugar goes back to normal, eat a meal or a snack within 1 hour.  Treating very low blood sugar If your blood sugar is at or below 54 mg/dL (3 mmol/L), you have very low blood sugar (severe hypoglycemia). This is an emergency. Do not wait to see if the symptoms will go away. Get medical help right away. Call your local emergency services (911 in the [place] Do not drive yourself to the hospital. If you have very low blood sugar and you cannot eat or drink, you may need a glucagon shot (injection). A family member or friend should learn how to check your blood sugar and how to give you a glucagon shot. Ask your doctor if you need to have a glucagon shot kit at home. What else is important  to manage my diabetes? Medicine Follow these instructions about insulin and diabetes medicines:  Take them as told by your doctor.  Adjust them as told by your doctor.  Do not run out of them.  Having diabetes can raise your risk for other long-term conditions. These include heart or kidney disease. Your doctor may prescribe medicines to help prevent problems from diabetes. Food   Make healthy food choices. These include: ? Chicken, fish, egg whites, and beans. ? Oats, whole wheat, bulgur, brown rice, quinoa, and millet. ? Fresh fruits and vegetables. ? Low-fat dairy  products. ? Nuts, avocado, olive oil, and canola oil.  Make a food plan with a specialist (dietitian).  Follow instructions from your doctor about what you cannot eat or drink.  Drink enough fluid to keep your pee (urine) clear or pale yellow.  Eat healthy snacks between healthy meals.  Keep track of carbs that you eat. Read food labels. Learn food serving sizes.  Follow your sick day plan when you cannot eat or drink normally. Make this plan with your doctor so it is ready to use. Activity  Exercise at least 3 times a week.  Do not go more than 2 days without exercising.  Talk with your doctor before you start a new exercise. Your doctor may need to adjust your insulin, medicines, or food. Lifestyle   Do not use any tobacco products. These include cigarettes, chewing tobacco, and e-cigarettes.If you need help quitting, ask your doctor.  Ask your doctor how much alcohol is safe for you.  Learn to deal with stress. If you need help with this, ask your doctor. Body care  Stay up to date with your shots (immunizations).  Have your eyes and feet checked by a doctor as often as told.  Check your skin and feet every day. Check for cuts, bruises, redness, blisters, or sores.  Brush your teeth and gums two times a day.  Floss at least one time a day.  Go to the dentist least one time every 6 months.  Stay at a healthy weight. General instructions   Take over-the-counter and prescription medicines only as told by your doctor.  Share your diabetes care plan with: ? Your work or school. ? People you live with.  Check your pee (urine) for ketones: ? When you are sick. ? As told by your doctor.  Carry a card or wear jewelry that says that you have diabetes.  Ask your doctor: ? Do I need to meet with a diabetes educator? ? Where can I find a support group for people with diabetes?  Keep all follow-up visits as told by your doctor. This is important. Where to find  more information: To learn more about diabetes, visit:  American Diabetes Association: www.diabetes.org  American Association of Diabetes Educators: www.diabeteseducator.org/patient-resources  This information is not intended to replace advice given to you by your health care provider. Make sure you discuss any questions you have with your health care provider. Document Released: 11/23/2015 Document Revised: 01/07/2016 Document Reviewed: 09/04/2015 Elsevier Interactive Patient Education  [employer].

## 2017-05-11 NOTE — Progress Notes (Signed)
Patient is here for f/up  

## 2017-05-11 NOTE — Progress Notes (Signed)
Subjective:  Patient ID: Zachary [name], male    DOB: 1971/08/12  Age: 46 y.o. MRN: [id number]  CC: Hypertension and Diabetes   HPI Zachary [name] presents for HTN and DM. History of left  pontine CVA in 03/2017 with hospital admit from 03/18/17-03/22/17. Received  PT/OT/ST services. Reports residual left sided weakness since CVA ambulates with assistance of cane.Currently taking DAPT with ASA and plavix. Reports upcoming neurologist appointment in November. History of HTN. He does not check BP at home. Cardiac symptoms none. Patient denies chest pain, chest pressure/discomfort, claudication, dyspnea, lower extremity edema, near-syncope, palpitations and syncope.  Cardiovascular risk factors: diabetes mellitus, dyslipidemia, hypertension, male gender and sedentary lifestyle. Use of agents associated with hypertension: none. History of target organ damage: stroke.History of DM. Symptoms: none. Patient denies foot ulcerations, nausea, paresthesia of the feet, polydipsia, polyuria, visual disturbances and vomitting.  Evaluation to date has been included: fasting blood sugar, fasting lipid panel and hemoglobin A1C.  Home sugars: BGs range between 80 and 120. Treatment to date: metformin.      Outpatient Medications Prior to Visit  Medication Sig Dispense Refill  . amLODipine (NORVASC) 10 MG tablet Take 1 tablet (10 mg total) by mouth daily. 30 tablet 3  . aspirin 81 MG EC tablet Take 1 tablet (81 mg total) by mouth at bedtime. 30 tablet 2  . atenolol (TENORMIN) 25 MG tablet Take 0.5 tablets (12.5 mg total) by mouth daily. 15 tablet 2  . atorvastatin (LIPITOR) 20 MG tablet Take 1 tablet (20 mg total) by mouth daily at 6 PM. 30 tablet 2  . BD ULTRA-FINE LANCETS lancets Use as instructed 100 each 12  . blood glucose meter kit and supplies KIT Dispense based on patient and insurance preference. Use up to four times daily as directed. (FOR ICD-9 250.00, 250.01). 1 each 0  . Blood Glucose Monitoring Suppl  (TRUE METRIX METER) w/Device KIT 1 each by Does not apply route 3 (three) times daily. 1 kit 0  . clopidogrel (PLAVIX) 75 MG tablet Take 1 tablet (75 mg total) by mouth daily. 30 tablet 2  . glucose blood (TRUE METRIX BLOOD GLUCOSE TEST) test strip Use as instructed 100 each 12  . hydrochlorothiazide (HYDRODIURIL) 25 MG tablet Take 1 tablet (25 mg total) by mouth daily. 30 tablet 2  . metFORMIN (GLUCOPHAGE) 500 MG tablet Take 1 tablet (500 mg total) by mouth 2 (two) times daily with a meal. 60 tablet 2  . potassium chloride SA (K-DUR,KLOR-CON) 20 MEQ tablet Take 1 tablet (20 mEq total) by mouth daily. 30 tablet 2  . topiramate (TOPAMAX) 50 MG tablet Take 1 tablet (50 mg total) by mouth at bedtime. 30 tablet 2   No facility-administered medications prior to visit.     ROS Review of Systems  Eyes: Negative.   Respiratory: Negative.   Cardiovascular: Negative.   Gastrointestinal: Negative.   Skin: Negative.   Neurological: Positive for weakness.   Objective:  BP 122/83   Pulse 86   Temp 99 F (37.2 C) (Oral)   Resp 18   Ht _0  (1.778 m)   Wt 198 lb 12.8 oz (90.2 kg)   SpO2 97%   BMI 28.52 kg/m   BP/Weight 05/11/2017 04/14/2017 3/88/8280  Systolic BP 034 917 915  Diastolic BP 83 83 84  Wt. (Lbs) 198.8 202 -  BMI 28.52 28.98 -   Physical Exam  Constitutional: He appears well-developed and well-nourished.  HENT:  Head: Normocephalic and atraumatic.  Right Ear: External ear normal.  Left Ear: External ear normal.  Nose: Nose normal.  Mouth/Throat: Oropharynx is clear and moist.  Eyes: Pupils are equal, round, and reactive to light. Conjunctivae are normal.  Neck: Normal range of motion. Neck supple. No JVD present.  Cardiovascular: Normal rate, regular rhythm, normal heart sounds and intact distal pulses.   Pulmonary/Chest: Effort normal and breath sounds normal.  Abdominal: Soft. Bowel sounds are normal.  Musculoskeletal:       Left hand: Decreased strength (4/5 motor  strength ) noted.  LLE/LUE 4/5 motor strength  Skin: Skin is warm and dry.  Nursing note and vitals reviewed.  Diabetic Foot Exam - Simple   Simple Foot Form Diabetic Foot exam was performed with the following findings:  Yes 05/11/2017 11:53 AM  Visual Inspection No deformities, no ulcerations, no other skin breakdown bilaterally:  Yes Sensation Testing Intact to touch and monofilament testing bilaterally:  Yes Pulse Check Posterior Tibialis and Dorsalis pulse intact bilaterally:  Yes Comments      Assessment & Plan:   1. Diabetes mellitus type 2 in nonobese (HCC)  - HgB A1c - Glucose (CBG) - Ambulatory referral to Ophthalmology  2. History of CVA with residual deficit  - Ambulatory referral to Ophthalmology   No orders of the defined types were placed in this encounter.   Follow-up: Return in about 2 months (around 07/11/2017) for HTN/DM.   Alfonse Spruce FNP

## 2017-05-15 DIAGNOSIS — I639 Cerebral infarction, unspecified: Secondary | ICD-10-CM

## 2017-05-15 HISTORY — DX: Cerebral infarction, unspecified: I63.9

## 2017-05-24 ENCOUNTER — Ambulatory Visit: Payer: Self-pay | Admitting: Neurology

## 2017-06-02 ENCOUNTER — Telehealth: Payer: Self-pay | Admitting: Family Medicine

## 2017-06-02 ENCOUNTER — Other Ambulatory Visit: Payer: Self-pay | Admitting: Physical Medicine & Rehabilitation

## 2017-06-02 MED ORDER — ATENOLOL 25 MG PO TABS: 12.5000 mg | ORAL_TABLET | Freq: Every day | ORAL | 2 refills | Status: DC

## 2017-06-02 NOTE — Telephone Encounter (Signed)
Patient called requesting medication refill on atenolol (TENORMIN) 25 MG tablet Pt states he is completely out of medication, pt [facility]

## 2017-06-02 NOTE — Telephone Encounter (Signed)
Atenolol refilled. 

## 2017-06-12 ENCOUNTER — Encounter: Payer: Self-pay | Admitting: Physical Medicine & Rehabilitation

## 2017-06-12 ENCOUNTER — Encounter: Payer: Self-pay | Attending: Physical Medicine & Rehabilitation | Admitting: Physical Medicine & Rehabilitation

## 2017-06-12 VITALS — BP 145/96 | HR 78

## 2017-06-12 DIAGNOSIS — I639 Cerebral infarction, unspecified: Secondary | ICD-10-CM

## 2017-06-12 DIAGNOSIS — I10 Essential (primary) hypertension: Secondary | ICD-10-CM | POA: Insufficient documentation

## 2017-06-12 DIAGNOSIS — E785 Hyperlipidemia, unspecified: Secondary | ICD-10-CM | POA: Insufficient documentation

## 2017-06-12 DIAGNOSIS — I6350 Cerebral infarction due to unspecified occlusion or stenosis of unspecified cerebral artery: Secondary | ICD-10-CM | POA: Insufficient documentation

## 2017-06-12 DIAGNOSIS — E119 Type 2 diabetes mellitus without complications: Secondary | ICD-10-CM | POA: Insufficient documentation

## 2017-06-12 DIAGNOSIS — G8194 Hemiplegia, unspecified affecting left nondominant side: Secondary | ICD-10-CM | POA: Insufficient documentation

## 2017-06-12 NOTE — Progress Notes (Signed)
Subjective:    Patient ID: Zachary [name], male    DOB: [birth year], 46 y.o.   MRN: [id number]  HPI   Zachary [name] is here in follow up of his right CVA. He is still experiencing some left side weakness which is mild. He is no longer using a device for gait. He walks his dog. His arm is a little weaker than the leg. Sometimes there is some tingling in the arm. His headaches resolved, and he stopped the topamax.  Bowels and bladder have been working normally. Family notes he's more "direct" than he was before but for the most part, he's been tolerable to be around.    He has been checking his BP at home. He remains on norvasc, hctz, and atenolol.  For the most part it has been under control. Sugars have been running 90-low 100's.   He works at a call center in a sedentary role. He hasn't resumed work yet. He is driving.   Pain Inventory Average Pain 0 Pain Right Now 0 My pain is na  In the last 24 hours, has pain interfered with the following? General activity 0 Relation with others 0 Enjoyment of life 0 What TIME of day is your pain at its worst? na Sleep (in general) Good  Pain is worse with: na Pain improves with: na Relief from Meds: na  Mobility walk without assistance  Function what is your job? call center  Neuro/Psych No problems in this area  Prior Studies Any changes since last visit?  no  Physicians involved in your care Any changes since last visit?  no   No family history on file. Social History   Social History  . Marital status: Married    Spouse name: N/A  . Number of children: N/A  . Years of education: N/A   Social History Main Topics  . Smoking status: Never Smoker  . Smokeless tobacco: Never Used  . Alcohol use No  . Drug use: No  . Sexual activity: No   Other Topics Concern  . None   Social History Narrative  . None   No past surgical history on file. Past Medical History:  Diagnosis Date  . Hypertension    BP (!) 145/96   Pulse  78   SpO2 97%   Opioid Risk Score:   Fall Risk Score:  `1  Depression screen PHQ 2/9  Depression screen [facility] 2/9 05/11/2017 04/14/2017 04/12/2017  Decreased Interest 0 1 3  Down, Depressed, Hopeless 0 0 0  PHQ - 2 Score 0 1 3  Altered sleeping 0 1 2  Tired, decreased energy 0 1 1  Change in appetite 0 0 0  Feeling bad or failure about yourself  0 0 1  Trouble concentrating 0 0 0  Moving slowly or fidgety/restless 0 0 0  Suicidal thoughts 0 0 0  PHQ-9 Score 0 3 7  Difficult doing work/chores - - Not difficult at all     Review of Systems  Constitutional: Negative.   HENT: Negative.   Eyes: Negative.   Respiratory: Negative.   Cardiovascular: Negative.   Gastrointestinal: Negative.   Endocrine: Negative.   Genitourinary: Negative.   Musculoskeletal: Negative.   Skin: Negative.   Allergic/Immunologic: Negative.   Neurological: Negative.   Hematological: Negative.   Psychiatric/Behavioral: Negative.   All other systems reviewed and are negative.      Objective:   Physical Exam  General: Alert and oriented x 3, No apparent distress HEENT: Head  is normocephalic, atraumatic, PERRLA, EOMI, sclera anicteric, oral mucosa pink and moist, dentition intact, ext ear canals clear,  Neck: Supple without JVD or lymphadenopathy Heart: Reg rate and rhythm. No murmurs rubs or gallops Chest: CTA bilaterally without wheezes, rales, or rhonchi; no distress Abdomen: Soft, non-tender, non-distended, bowel sounds positive. Extremities: No clubbing, cyanosis, or edema. Pulses are 2+ Skin: Clean and intact without signs of breakdown Neuro: Pt is cognitively appropriate with normal insight, memory, and awareness. Cranial nerves 2-12 are intact. Sensory exam is normal. Reflexes are 2+ in all 4's. Fine motor coordination is intact. No tremors. Mild LUE weakness 4+/5 otherwise 5/5. Normal gait/tandem gait.  Musculoskeletal: Full ROM, No pain with AROM or PROM in the neck, trunk, or extremities.  Posture appropriate Psych: Pt's affect is appropriate. Pt is cooperative         Assessment & Plan:  1. 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             continue meds as above  -he will continue to check at home.                3. Diabetes mellitus. Hemoglobin A1c 8.1. Glucophage 500 mg twice a day.    -reiterated importance of tight glucose control moving forward  Fifteen minutes of face to face patient care time were spent during this visit. All questions were encouraged and answered.  Follow up PRN

## 2017-06-12 NOTE — Patient Instructions (Signed)
PLEASE FEEL FREE TO CALL OUR OFFICE WITH ANY PROBLEMS OR QUESTIONS ([phone])      

## 2017-07-04 ENCOUNTER — Ambulatory Visit: Payer: Self-pay | Admitting: Neurology

## 2017-07-04 ENCOUNTER — Other Ambulatory Visit: Payer: Self-pay

## 2017-07-05 ENCOUNTER — Other Ambulatory Visit: Payer: Self-pay

## 2017-07-05 NOTE — Patient Outreach (Signed)
Telephone outreach to patient to obtain mRS was successfully completed. mRS = 0 

## 2017-07-11 ENCOUNTER — Ambulatory Visit: Payer: Self-pay | Admitting: Family Medicine

## 2017-08-01 ENCOUNTER — Ambulatory Visit: Payer: Self-pay | Admitting: Neurology

## 2017-08-22 ENCOUNTER — Telehealth: Payer: Self-pay | Admitting: Family Medicine

## 2017-08-22 ENCOUNTER — Other Ambulatory Visit: Payer: Self-pay | Admitting: Family Medicine

## 2017-08-22 DIAGNOSIS — E785 Hyperlipidemia, unspecified: Secondary | ICD-10-CM

## 2017-08-22 NOTE — Telephone Encounter (Signed)
Patient was supposed to follow up in November and did not. Has not rescheduled. Will forward to PCP

## 2017-08-22 NOTE — Telephone Encounter (Signed)
Pt called to request a medication refill on all medicatios. If possible please send to  [id number] [place],  - [address] Please follow up

## 2017-08-23 ENCOUNTER — Other Ambulatory Visit: Payer: Self-pay | Admitting: Family Medicine

## 2017-08-23 DIAGNOSIS — E785 Hyperlipidemia, unspecified: Secondary | ICD-10-CM

## 2017-08-23 MED ORDER — CLOPIDOGREL BISULFATE 75 MG PO TABS: 75.0000 mg | ORAL_TABLET | Freq: Every day | ORAL | 0 refills | Status: DC

## 2017-08-23 MED ORDER — ATORVASTATIN CALCIUM 20 MG PO TABS: 20.0000 mg | ORAL_TABLET | Freq: Every day | ORAL | 0 refills | Status: DC

## 2017-08-23 MED ORDER — METFORMIN HCL 500 MG PO TABS: 500.0000 mg | ORAL_TABLET | Freq: Two times a day (BID) | ORAL | 0 refills | Status: DC

## 2017-08-23 MED ORDER — ASPIRIN 81 MG PO TBEC: 81.0000 mg | DELAYED_RELEASE_TABLET | Freq: Every day | ORAL | 0 refills | Status: DC

## 2017-08-23 MED ORDER — ATENOLOL 25 MG PO TABS: 12.5000 mg | ORAL_TABLET | Freq: Every day | ORAL | 0 refills | Status: DC

## 2017-08-23 MED ORDER — HYDROCHLOROTHIAZIDE 25 MG PO TABS: 25.0000 mg | ORAL_TABLET | Freq: Every day | ORAL | 0 refills | Status: DC

## 2017-08-23 MED ORDER — AMLODIPINE BESYLATE 10 MG PO TABS: 10.0000 mg | ORAL_TABLET | Freq: Every day | ORAL | 0 refills | Status: DC

## 2017-08-23 NOTE — Telephone Encounter (Signed)
Refilled for 30 day supply only. Recommend scheduling follow up appointment to be seen in office.

## 2017-09-20 ENCOUNTER — Other Ambulatory Visit: Payer: Self-pay | Admitting: Family Medicine

## 2017-09-27 ENCOUNTER — Other Ambulatory Visit: Payer: Self-pay | Admitting: Family Medicine

## 2017-09-27 DIAGNOSIS — E785 Hyperlipidemia, unspecified: Secondary | ICD-10-CM

## 2017-09-28 ENCOUNTER — Other Ambulatory Visit: Payer: Self-pay | Admitting: Family Medicine

## 2017-09-28 ENCOUNTER — Telehealth: Payer: Self-pay | Admitting: Family Medicine

## 2017-09-28 DIAGNOSIS — E785 Hyperlipidemia, unspecified: Secondary | ICD-10-CM

## 2017-09-28 DIAGNOSIS — Z760 Encounter for issue of repeat prescription: Secondary | ICD-10-CM

## 2017-09-28 MED ORDER — CLOPIDOGREL BISULFATE 75 MG PO TABS: 75.0000 mg | ORAL_TABLET | Freq: Every day | ORAL | 2 refills | Status: DC

## 2017-09-28 MED ORDER — AMLODIPINE BESYLATE 10 MG PO TABS
10.0000 mg | ORAL_TABLET | Freq: Every day | ORAL | 0 refills | Status: DC
Start: 2017-09-28 — End: 2017-10-31

## 2017-09-28 MED ORDER — ATORVASTATIN CALCIUM 20 MG PO TABS: 20.0000 mg | ORAL_TABLET | Freq: Every day | ORAL | 2 refills | Status: DC

## 2017-09-28 MED ORDER — ASPIRIN 81 MG PO TBEC: 81.0000 mg | DELAYED_RELEASE_TABLET | Freq: Every day | ORAL | 2 refills | Status: DC

## 2017-09-28 MED ORDER — METFORMIN HCL 500 MG PO TABS: 500.0000 mg | ORAL_TABLET | Freq: Two times a day (BID) | ORAL | 2 refills | Status: DC

## 2017-09-28 MED ORDER — ATENOLOL 25 MG PO TABS: 12.5000 mg | ORAL_TABLET | Freq: Every day | ORAL | 0 refills | Status: DC

## 2017-09-28 NOTE — Telephone Encounter (Signed)
Pt called to request a refill on all medication and have them sent to  [id number] [address] [place], [place] - [address] Please follow up

## 2017-09-28 NOTE — Telephone Encounter (Signed)
Last fill. Needs to be seen in office for additional refills. Recommend he schedule follow up appointment.

## 2017-09-28 NOTE — Telephone Encounter (Signed)
Patient was given 30 day supply in January and instructed to make OV but did not. Will forward to PCP

## 2017-09-30 ENCOUNTER — Other Ambulatory Visit: Payer: Self-pay | Admitting: Family Medicine

## 2017-10-02 NOTE — Telephone Encounter (Signed)
CMA called patient. No answer and left a voicemail for patient.  If patient call back, please let patient know: Needs to schedule follow-up appt for additional refills.

## 2017-10-05 ENCOUNTER — Other Ambulatory Visit: Payer: Self-pay | Admitting: Pharmacist

## 2017-10-05 MED ORDER — HYDROCHLOROTHIAZIDE 25 MG PO TABS: 25.0000 mg | ORAL_TABLET | Freq: Every day | ORAL | 0 refills | Status: DC

## 2017-10-31 ENCOUNTER — Ambulatory Visit: Payer: 59 | Attending: Nurse Practitioner | Admitting: Nurse Practitioner

## 2017-10-31 ENCOUNTER — Encounter: Payer: Self-pay | Admitting: Nurse Practitioner

## 2017-10-31 VITALS — BP 157/99 | HR 83 | Temp 98.0°F | Ht 70.0 in | Wt 207.4 lb

## 2017-10-31 DIAGNOSIS — Z79899 Other long term (current) drug therapy: Secondary | ICD-10-CM | POA: Insufficient documentation

## 2017-10-31 DIAGNOSIS — I6930 Unspecified sequelae of cerebral infarction: Secondary | ICD-10-CM

## 2017-10-31 DIAGNOSIS — Z760 Encounter for issue of repeat prescription: Secondary | ICD-10-CM | POA: Insufficient documentation

## 2017-10-31 DIAGNOSIS — I69354 Hemiplegia and hemiparesis following cerebral infarction affecting left non-dominant side: Secondary | ICD-10-CM | POA: Diagnosis not present

## 2017-10-31 DIAGNOSIS — E119 Type 2 diabetes mellitus without complications: Secondary | ICD-10-CM | POA: Diagnosis not present

## 2017-10-31 DIAGNOSIS — I10 Essential (primary) hypertension: Secondary | ICD-10-CM | POA: Insufficient documentation

## 2017-10-31 DIAGNOSIS — Z7984 Long term (current) use of oral hypoglycemic drugs: Secondary | ICD-10-CM | POA: Diagnosis not present

## 2017-10-31 LAB — GLUCOSE, POCT (MANUAL RESULT ENTRY): POC GLUCOSE: 89 mg/dL (ref 70–99)

## 2017-10-31 LAB — POCT GLYCOSYLATED HEMOGLOBIN (HGB A1C): Hemoglobin A1C: 7

## 2017-10-31 MED ORDER — ATORVASTATIN CALCIUM 20 MG PO TABS: 20.0000 mg | ORAL_TABLET | Freq: Every day | ORAL | 1 refills | Status: DC

## 2017-10-31 MED ORDER — ASPIRIN 81 MG PO TBEC: 81.0000 mg | DELAYED_RELEASE_TABLET | Freq: Every day | ORAL | 1 refills | Status: DC

## 2017-10-31 MED ORDER — LISINOPRIL-HYDROCHLOROTHIAZIDE 20-25 MG PO TABS: 1.0000 | ORAL_TABLET | Freq: Every day | ORAL | 3 refills | Status: DC

## 2017-10-31 MED ORDER — AMLODIPINE BESYLATE 10 MG PO TABS
10.0000 mg | ORAL_TABLET | Freq: Every day | ORAL | 1 refills | Status: DC
Start: 2017-10-31 — End: 2018-06-01

## 2017-10-31 MED ORDER — METFORMIN HCL 500 MG PO TABS: 500.0000 mg | ORAL_TABLET | Freq: Two times a day (BID) | ORAL | 1 refills | Status: DC

## 2017-10-31 MED ORDER — CLOPIDOGREL BISULFATE 75 MG PO TABS: 75.0000 mg | ORAL_TABLET | Freq: Every day | ORAL | 1 refills | Status: DC

## 2017-10-31 MED ORDER — ATENOLOL 25 MG PO TABS: 12.5000 mg | ORAL_TABLET | Freq: Every day | ORAL | 3 refills | Status: DC

## 2017-10-31 NOTE — Patient Instructions (Addendum)
Diabetes Mellitus and Nutrition When you have diabetes (diabetes mellitus), it is very important to have healthy eating habits because your blood sugar (glucose) levels are greatly affected by what you eat and drink. Eating healthy foods in the appropriate amounts, at about the same times every day, can help you:  Control your blood glucose.  Lower your risk of heart disease.  Improve your blood pressure.  Reach or maintain a healthy weight.  Every person with diabetes is different, and each person has different needs for a meal plan. Your health care provider may recommend that you work with a diet and nutrition specialist (dietitian) to make a meal plan that is best for you. Your meal plan may vary depending on factors such as:  The calories you need.  The medicines you take.  Your weight.  Your blood glucose, blood pressure, and cholesterol levels.  Your activity level.  Other health conditions you have, such as heart or kidney disease.  How do carbohydrates affect me? Carbohydrates affect your blood glucose level more than any other type of food. Eating carbohydrates naturally increases the amount of glucose in your blood. Carbohydrate counting is a method for keeping track of how many carbohydrates you eat. Counting carbohydrates is important to keep your blood glucose at a healthy level, especially if you use insulin or take certain oral diabetes medicines. It is important to know how many carbohydrates you can safely have in each meal. This is different for every person. Your dietitian can help you calculate how many carbohydrates you should have at each meal and for snack. Foods that contain carbohydrates include:  Bread, cereal, rice, pasta, and crackers.  Potatoes and corn.  Peas, beans, and lentils.  Milk and yogurt.  Fruit and juice.  Desserts, such as cakes, cookies, ice cream, and candy.  How does alcohol affect me? Alcohol can cause a sudden decrease in blood  glucose (hypoglycemia), especially if you use insulin or take certain oral diabetes medicines. Hypoglycemia can be a life-threatening condition. Symptoms of hypoglycemia (sleepiness, dizziness, and confusion) are similar to symptoms of having too much alcohol. If your health care provider says that alcohol is safe for you, follow these guidelines:  Limit alcohol intake to no more than 1 drink per day for nonpregnant women and 2 drinks per day for men. One drink equals 12 oz of beer, 5 oz of wine, or 1 oz of hard liquor.  Do not drink on an empty stomach.  Keep yourself hydrated with water, diet soda, or unsweetened iced tea.  Keep in mind that regular soda, juice, and other mixers may contain a lot of sugar and must be counted as carbohydrates.  What are tips for following this plan? Reading food labels  Start by checking the serving size on the label. The amount of calories, carbohydrates, fats, and other nutrients listed on the label are based on one serving of the food. Many foods contain more than one serving per package.  Check the total grams (g) of carbohydrates in one serving. You can calculate the number of servings of carbohydrates in one serving by dividing the total carbohydrates by 15. For example, if a food has 30 g of total carbohydrates, it would be equal to 2 servings of carbohydrates.  Check the number of grams (g) of saturated and trans fats in one serving. Choose foods that have low or no amount of these fats.  Check the number of milligrams (mg) of sodium in one serving. Most people   should limit total sodium intake to less than 2,300 mg per day.  Always check the nutrition information of foods labeled as "low-fat" or "nonfat". These foods may be higher in added sugar or refined carbohydrates and should be avoided.  Talk to your dietitian to identify your daily goals for nutrients listed on the label. Shopping  Avoid buying canned, premade, or processed foods. These  foods tend to be high in fat, sodium, and added sugar.  Shop around the outside edge of the grocery store. This includes fresh fruits and vegetables, bulk grains, fresh meats, and fresh dairy. Cooking  Use low-heat cooking methods, such as baking, instead of high-heat cooking methods like deep frying.  Cook using healthy oils, such as olive, canola, or sunflower oil.  Avoid cooking with butter, cream, or high-fat meats. Meal planning  Eat meals and snacks regularly, preferably at the same times every day. Avoid going long periods of time without eating.  Eat foods high in fiber, such as fresh fruits, vegetables, beans, and whole grains. Talk to your dietitian about how many servings of carbohydrates you can eat at each meal.  Eat 4-6 ounces of lean protein each day, such as lean meat, chicken, fish, eggs, or tofu. 1 ounce is equal to 1 ounce of meat, chicken, or fish, 1 egg, or 1/4 cup of tofu.  Eat some foods each day that contain healthy fats, such as avocado, nuts, seeds, and fish. Lifestyle   Check your blood glucose regularly.  Exercise at least 30 minutes 5 or more days each week, or as told by your health care provider.  Take medicines as told by your health care provider.  Do not use any products that contain nicotine or tobacco, such as cigarettes and e-cigarettes. If you need help quitting, ask your health care provider.  Work with a counselor or diabetes educator to identify strategies to manage stress and any emotional and social challenges. What are some questions to ask my health care provider?  Do I need to meet with a diabetes educator?  Do I need to meet with a dietitian?  What number can I call if I have questions?  When are the best times to check my blood glucose? Where to find more information:  American Diabetes Association: diabetes.org/food-and-fitness/food  Academy of Nutrition and Dietetics:  www.eatright.org/resources/health/diseases-and-conditions/diabetes  National Institute of Diabetes and Digestive and Kidney Diseases (NIH): www.niddk.nih.gov/health-information/diabetes/overview/diet-eating-physical-activity Summary  A healthy meal plan will help you control your blood glucose and maintain a healthy lifestyle.  Working with a diet and nutrition specialist (dietitian) can help you make a meal plan that is best for you.  Keep in mind that carbohydrates and alcohol have immediate effects on your blood glucose levels. It is important to count carbohydrates and to use alcohol carefully. This information is not intended to replace advice given to you by your health care provider. Make sure you discuss any questions you have with your health care provider. Document Released: 04/28/2005 Document Revised: 09/05/2016 Document Reviewed: 09/05/2016 Elsevier Interactive Patient Education  2018 Elsevier Inc.  

## 2017-10-31 NOTE — Progress Notes (Signed)
Assessment & Plan:  Zachary [name] was seen today for establish care and medication refill.  Diagnoses and all orders for this visit:  Essential hypertension -     lisinopril-hydrochlorothiazide (PRINZIDE,ZESTORETIC) 20-25 MG tablet; Take 1 tablet by mouth daily. -     amLODipine (NORVASC) 10 MG tablet; Take 1 tablet (10 mg total) by mouth daily. -     atenolol (TENORMIN) 25 MG tablet; Take 0.5 tablets (12.5 mg total) by mouth daily.  Continue all antihypertensives as prescribed.  Remember to bring in your blood pressure log with you for your follow up appointment.  DASH/Mediterranean Diets are healthier choices for HTN.   Diabetes mellitus type 2 in nonobese (HCC) -     Glucose (CBG) -     HgB A1c -     metFORMIN (GLUCOPHAGE) 500 MG tablet; Take 1 tablet (500 mg total) by mouth 2 (two) times daily with a meal. -     Ambulatory referral to Ophthalmology   Continue blood sugar control as discussed in office today, low carbohydrate diet, and regular physical exercise as tolerated, 150 minutes per week (30 min each day, 5 days per week, or 50 min 3 days per week). Keep blood sugar logs with fasting goal of 80-130 mg/dl, post prandial less than 180.  For Hypoglycemia: BS <60 and Hyperglycemia BS >400; contact the clinic ASAP. Annual eye exams and foot exams are recommended.    History of CVA with residual deficit -     atorvastatin (LIPITOR) 20 MG tablet; Take 1 tablet (20 mg total) by mouth daily at 6 PM. -     aspirin 81 MG EC tablet; Take 1 tablet (81 mg total) by mouth at bedtime. -     clopidogrel (PLAVIX) 75 MG tablet; Take 1 tablet (75 mg total) by mouth daily. -     Ambulatory referral to Physical Therapy  Hemiparesis affecting left side as late effect of cerebrovascular accident (CVA) [facility]) -     Ambulatory referral to Physical Therapy    Patient has been counseled on age-appropriate routine health concerns for screening and prevention. These are reviewed and up-to-date.  Referrals have been placed accordingly. Immunizations are up-to-date or declined.    Subjective:   Chief Complaint  Patient presents with  . Establish Care    Pt's is here to establish care for hypertension and diabetes.   . Medication Refill   HPI Zachary [name] 47 y.o. male presents to office today to establish care.  Essential Hypertension Chronic. Not well controlled. He endorses medication compliance. Checking his blood pressure at home with readings: 135-148/80-mid 90s. Denies chest pain, shortness of breath, palpitations, lightheadedness, dizziness, headaches or BLE edema. Will switch HCTZ to prinzide. He has a history of hypokalemia. Will need to monitor electrolytes closely.  BP Readings from Last 3 Encounters:  10/31/17 (!) 157/99  06/12/17 (!) 145/96  05/11/17 122/83     Diabetes Mellitus Type 2 Spot checking blood sugars 80-110s. He does walk on his treadmill as a form of exercise. He denies any symptoms of hypo or hyperglycemia. He is due for an eye exam. Will place referral today. He denies any visual disturbances.  Lab Results  Component Value Date   HGBA1C 7.0 10/31/2017     History of CVA Right [place] CVA 03-2017 with left sided weakness. He was also admitted to IP rehab after his stroke. Today he reports that he continues to experience left upper extremity weakness. He is unable to raise his left  arm past shoulder height or fully adduct his left arm across his midline.  He does not require assistive devices for mobility. He also endorses an intermittent sensation in his left leg that he is unable to describe.   Review of Systems  Constitutional: Negative for fever, malaise/fatigue and weight loss.  HENT: Negative.  Negative for nosebleeds.   Eyes: Negative.  Negative for blurred vision, double vision and photophobia.  Respiratory: Negative.  Negative for cough and shortness of breath.   Cardiovascular: Negative.  Negative for chest pain, palpitations and leg  swelling.  Gastrointestinal: Negative.  Negative for heartburn, nausea and vomiting.  Musculoskeletal: Positive for myalgias (left shoulder). Negative for back pain and falls.  Neurological: Positive for sensory change and focal weakness. Negative for dizziness, seizures and headaches.  Psychiatric/Behavioral: Negative.  Negative for suicidal ideas.    Past Medical History:  Diagnosis Date  . Diabetes mellitus without complication ([place])   . Hypertension   . Stroke [facility]) 05/2017   Right [place] Stroke    History reviewed. No pertinent surgical history.  Family History  Problem Relation Age of Onset  . Hypertension Mother     Social History Reviewed with no changes to be made today.   Outpatient Medications Prior to Visit  Medication Sig Dispense Refill  . BD ULTRA-FINE LANCETS lancets Use as instructed 100 each 12  . blood glucose meter kit and supplies KIT Dispense based on patient and insurance preference. Use up to four times daily as directed. (FOR ICD-9 250.00, 250.01). 1 each 0  . Blood Glucose Monitoring Suppl (TRUE METRIX METER) w/Device KIT 1 each by Does not apply route 3 (three) times daily. 1 kit 0  . glucose blood (TRUE METRIX BLOOD GLUCOSE TEST) test strip Use as instructed 100 each 12  . amLODipine (NORVASC) 10 MG tablet Take 1 tablet (10 mg total) by mouth daily. 14 tablet 0  . aspirin 81 MG EC tablet Take 1 tablet (81 mg total) by mouth at bedtime. 30 tablet 2  . atenolol (TENORMIN) 25 MG tablet Take 0.5 tablets (12.5 mg total) by mouth daily. 7 tablet 0  . atorvastatin (LIPITOR) 20 MG tablet Take 1 tablet (20 mg total) by mouth daily at 6 PM. 30 tablet 2  . clopidogrel (PLAVIX) 75 MG tablet Take 1 tablet (75 mg total) by mouth daily. 30 tablet 2  . hydrochlorothiazide (HYDRODIURIL) 25 MG tablet Take 1 tablet (25 mg total) by mouth daily. 30 tablet 0  . metFORMIN (GLUCOPHAGE) 500 MG tablet Take 1 tablet (500 mg total) by mouth 2 (two) times daily with a meal. 30  tablet 2  . potassium chloride SA (K-DUR,KLOR-CON) 20 MEQ tablet Take 1 tablet (20 mEq total) by mouth daily. (Patient not taking: Reported on 10/31/2017) 30 tablet 2   No facility-administered medications prior to visit.     No Known Allergies     Objective:    BP (!) 157/99 (BP Location: Right Arm, Patient Position: Sitting, Cuff Size: Normal)   Pulse 83   Temp 98 F (36.7 C) (Oral)   Ht _0  (1.778 m)   Wt 207 lb 6.4 oz (94.1 kg)   SpO2 99%   BMI 29.76 kg/m  Wt Readings from Last 3 Encounters:  10/31/17 207 lb 6.4 oz (94.1 kg)  05/11/17 198 lb 12.8 oz (90.2 kg)  04/14/17 202 lb (91.6 kg)    Physical Exam  Constitutional: He is oriented to person, place, and time. He appears well-developed and well-nourished.  He is cooperative.  HENT:  Head: Normocephalic and atraumatic.  Eyes: EOM are normal.  Neck: Normal range of motion.  Cardiovascular: Normal rate, regular rhythm and normal heart sounds. Exam reveals no gallop and no friction rub.  No murmur heard. Pulmonary/Chest: Effort normal and breath sounds normal. No tachypnea. No respiratory distress. He has no decreased breath sounds. He has no wheezes. He has no rhonchi. He has no rales. He exhibits no tenderness.  Abdominal: Soft. Bowel sounds are normal.  Musculoskeletal: He exhibits tenderness. He exhibits no edema or deformity.       Left shoulder: He exhibits decreased range of motion, tenderness, pain and decreased strength. He exhibits no bony tenderness, no swelling and no effusion.  TTP of left shoulder  Pain elicited with passive adduction and abduction of left arm   Neurological: He is alert and oriented to person, place, and time. Coordination normal.  Skin: Skin is warm and dry.  Psychiatric: He has a normal mood and affect. His behavior is normal. Judgment and thought content normal.  Nursing note and vitals reviewed.     Patient has been counseled extensively about nutrition and exercise as well as the  importance of adherence with medications and regular follow-up. The patient was given clear instructions to go to ER or return to medical center if symptoms don't improve, worsen or new problems develop. The patient verbalized understanding.   Follow-up: Return in about 3 weeks (around 11/21/2017) for BP recheck and labs.   Gildardo Pounds, FNP-BC [facility] and [place], [place]   10/31/2017, 3:07 PM

## 2017-11-22 ENCOUNTER — Ambulatory Visit: Payer: 59 | Attending: Nurse Practitioner | Admitting: Nurse Practitioner

## 2017-11-22 ENCOUNTER — Encounter: Payer: Self-pay | Admitting: Nurse Practitioner

## 2017-11-22 VITALS — BP 138/82 | HR 71 | Temp 98.8°F | Ht 70.0 in | Wt 208.6 lb

## 2017-11-22 DIAGNOSIS — Z7982 Long term (current) use of aspirin: Secondary | ICD-10-CM | POA: Diagnosis not present

## 2017-11-22 DIAGNOSIS — Z8249 Family history of ischemic heart disease and other diseases of the circulatory system: Secondary | ICD-10-CM | POA: Insufficient documentation

## 2017-11-22 DIAGNOSIS — Z79899 Other long term (current) drug therapy: Secondary | ICD-10-CM | POA: Insufficient documentation

## 2017-11-22 DIAGNOSIS — I10 Essential (primary) hypertension: Secondary | ICD-10-CM | POA: Insufficient documentation

## 2017-11-22 DIAGNOSIS — E119 Type 2 diabetes mellitus without complications: Secondary | ICD-10-CM | POA: Diagnosis not present

## 2017-11-22 DIAGNOSIS — Z7984 Long term (current) use of oral hypoglycemic drugs: Secondary | ICD-10-CM | POA: Diagnosis not present

## 2017-11-22 DIAGNOSIS — Z8673 Personal history of transient ischemic attack (TIA), and cerebral infarction without residual deficits: Secondary | ICD-10-CM | POA: Diagnosis not present

## 2017-11-22 LAB — GLUCOSE, POCT (MANUAL RESULT ENTRY): POC Glucose: 108 mg/dl — AB (ref 70–99)

## 2017-11-22 MED ORDER — ATENOLOL 25 MG PO TABS: 25.0000 mg | ORAL_TABLET | Freq: Every day | ORAL | 1 refills | Status: DC

## 2017-11-22 NOTE — Progress Notes (Signed)
c 

## 2017-11-22 NOTE — Patient Instructions (Signed)
Blood pressure parameters:  Less than 140/90   Heart rate parameters:  >55

## 2017-11-22 NOTE — Progress Notes (Signed)
Assessment & Plan:  Zachary [name] was seen today for blood pressure check.  Diagnoses and all orders for this visit:  Essential hypertension -     atenolol (TENORMIN) 25 MG tablet; Take 1 tablet (25 mg total) by mouth daily. -     Basic metabolic panel Continue all antihypertensives as prescribed.  Remember to bring in your blood pressure log with you for your follow up appointment.  DASH/Mediterranean Diets are healthier choices for HTN.  Monitoring Parameters discussed: call if BP consistently >140/90 or HR <55  Diabetes mellitus type 2 in nonobese (HCC) -     Glucose (CBG) -     Basic metabolic panel Continue blood sugar control as discussed in office today, low carbohydrate diet, and regular physical exercise as tolerated, 150 minutes per week (30 min each day, 5 days per week, or 50 min 3 days per week). Keep blood sugar logs with fasting goal of 80-130 mg/dl, post prandial less than 180.  For Hypoglycemia: BS <60 and Hyperglycemia BS >400; contact the clinic ASAP. Annual eye exams and foot exams are recommended.   Patient has been counseled on age-appropriate routine health concerns for screening and prevention. These are reviewed and up-to-date. Referrals have been placed accordingly. Immunizations are up-to-date or declined.    Subjective:   Chief Complaint  Patient presents with  . Blood Pressure Check    Pt. is here for a blood pressure re-check.    HPI Zachary [name] 47 y.o. male presents to office today for follow up to HTN.    Essential Hypertension He is checking his blood pressures at home "spot checks a few times a week". Range 130-140/80-90s. He endorses medication compliance. Denies chest pain, shortness of breath, palpitations, lightheadedness, dizziness, headaches or BLE edema. Will increase atenolol due to readings at home and today's initial reading 145/104.  BP Readings from Last 3 Encounters:  11/22/17 138/82  10/31/17 (!) 157/99  06/12/17 (!) 145/96     Review of Systems  Constitutional: Negative for fever, malaise/fatigue and weight loss.  HENT: Negative.  Negative for nosebleeds.   Eyes: Negative.  Negative for blurred vision, double vision and photophobia.  Respiratory: Negative.  Negative for cough and shortness of breath.   Cardiovascular: Negative.  Negative for chest pain, palpitations and leg swelling.  Gastrointestinal: Negative.  Negative for heartburn, nausea and vomiting.  Musculoskeletal: Negative.  Negative for myalgias.  Neurological: Negative.  Negative for dizziness, focal weakness, seizures and headaches.  Psychiatric/Behavioral: Negative.  Negative for suicidal ideas.    Past Medical History:  Diagnosis Date  . Diabetes mellitus without complication ([place])   . Hypertension   . Stroke [facility]) 05/2017   Right [place] Stroke    History reviewed. No pertinent surgical history.  Family History  Problem Relation Age of Onset  . Hypertension Mother     Social History Reviewed with no changes to be made today.   Outpatient Medications Prior to Visit  Medication Sig Dispense Refill  . amLODipine (NORVASC) 10 MG tablet Take 1 tablet (10 mg total) by mouth daily. 90 tablet 1  . aspirin 81 MG EC tablet Take 1 tablet (81 mg total) by mouth at bedtime. 90 tablet 1  . atorvastatin (LIPITOR) 20 MG tablet Take 1 tablet (20 mg total) by mouth daily at 6 PM. 90 tablet 1  . BD ULTRA-FINE LANCETS lancets Use as instructed 100 each 12  . blood glucose meter kit and supplies KIT Dispense based on patient and insurance preference.  Use up to four times daily as directed. (FOR ICD-9 250.00, 250.01). 1 each 0  . Blood Glucose Monitoring Suppl (TRUE METRIX METER) w/Device KIT 1 each by Does not apply route 3 (three) times daily. 1 kit 0  . clopidogrel (PLAVIX) 75 MG tablet Take 1 tablet (75 mg total) by mouth daily. 90 tablet 1  . glucose blood (TRUE METRIX BLOOD GLUCOSE TEST) test strip Use as instructed 100 each 12  .  lisinopril-hydrochlorothiazide (PRINZIDE,ZESTORETIC) 20-25 MG tablet Take 1 tablet by mouth daily. 90 tablet 3  . metFORMIN (GLUCOPHAGE) 500 MG tablet Take 1 tablet (500 mg total) by mouth 2 (two) times daily with a meal. 90 tablet 1  . atenolol (TENORMIN) 25 MG tablet Take 0.5 tablets (12.5 mg total) by mouth daily. 45 tablet 3  . potassium chloride SA (K-DUR,KLOR-CON) 20 MEQ tablet Take 1 tablet (20 mEq total) by mouth daily. (Patient not taking: Reported on 10/31/2017) 30 tablet 2   No facility-administered medications prior to visit.     No Known Allergies     Objective:    BP 138/82 (BP Location: Right Arm, Patient Position: Sitting, Cuff Size: Large)   Pulse 71   Temp 98.8 F (37.1 C) (Oral)   Ht 5' 10"  (1.778 m)   Wt 208 lb 9.6 oz (94.6 kg)   SpO2 100%   BMI 29.93 kg/m  Wt Readings from Last 3 Encounters:  11/22/17 208 lb 9.6 oz (94.6 kg)  10/31/17 207 lb 6.4 oz (94.1 kg)  05/11/17 198 lb 12.8 oz (90.2 kg)    Physical Exam  Constitutional: He is oriented to person, place, and time. He appears well-developed and well-nourished. He is cooperative.  HENT:  Head: Normocephalic and atraumatic.  Eyes: EOM are normal.  Neck: Normal range of motion.  Cardiovascular: Normal rate, regular rhythm and normal heart sounds. Exam reveals no gallop and no friction rub.  No murmur heard. Pulmonary/Chest: Effort normal and breath sounds normal. No tachypnea. No respiratory distress. He has no decreased breath sounds. He has no wheezes. He has no rhonchi. He has no rales. He exhibits no tenderness.  Abdominal: Soft. Bowel sounds are normal.  Musculoskeletal: Normal range of motion. He exhibits no edema.  Neurological: He is alert and oriented to person, place, and time. Coordination normal.  Skin: Skin is warm and dry.  Psychiatric: He has a normal mood and affect. His behavior is normal. Judgment and thought content normal.  Nursing note and vitals reviewed.        Patient has  been counseled extensively about nutrition and exercise as well as the importance of adherence with medications and regular follow-up. The patient was given clear instructions to go to ER or return to medical center if symptoms don't improve, worsen or new problems develop. The patient verbalized understanding.   Follow-up: Return in about 3 months (around 02/21/2018).   Gildardo Pounds, FNP-BC [facility] and [place], [place]   11/22/2017, 1:39 PM

## 2017-11-23 LAB — BASIC METABOLIC PANEL
BUN/Creatinine Ratio: 16 (ref 9–20)
BUN: 19 mg/dL (ref 6–24)
CALCIUM: 10.1 mg/dL (ref 8.7–10.2)
CHLORIDE: 97 mmol/L (ref 96–106)
CO2: 25 mmol/L (ref 20–29)
Creatinine, Ser: 1.18 mg/dL (ref 0.76–1.27)
GFR calc Af Amer: 84 mL/min/{1.73_m2} (ref >59)
GFR calc non Af Amer: 73 mL/min/{1.73_m2} (ref >59)
Glucose: 118 mg/dL — ABNORMAL HIGH (ref 65–99)
POTASSIUM: 4.1 mmol/L (ref 3.5–5.2)
Sodium: 138 mmol/L (ref 134–144)

## 2017-11-27 ENCOUNTER — Telehealth: Payer: Self-pay

## 2017-11-27 NOTE — Telephone Encounter (Signed)
CMA attempt to call patient. No answer and left a VM for patient.  If patient call back, please inform:  Labs are essentially normal. Make sure you are drinking at least 48 oz of water per day. Work on eating a low fat, heart healthy diet and participate in regular aerobic exercise program to control as well. Exercise at least 150 minutes per week.

## 2017-11-27 NOTE — Telephone Encounter (Signed)
-----   Message from Gildardo Pounds, NP sent at 11/26/2017  8:38 PM EDT ----- Labs are essentially normal. Make sure you are drinking at least 48 oz of water per day. Work on eating a low fat, heart healthy diet and participate in regular aerobic exercise program to control as well. Exercise at least 150 minutes per week.

## 2017-12-04 ENCOUNTER — Telehealth: Payer: Self-pay

## 2017-12-04 ENCOUNTER — Other Ambulatory Visit: Payer: Self-pay

## 2017-12-04 ENCOUNTER — Ambulatory Visit: Payer: 59 | Attending: Nurse Practitioner

## 2017-12-04 ENCOUNTER — Other Ambulatory Visit: Payer: Self-pay | Admitting: Nurse Practitioner

## 2017-12-04 DIAGNOSIS — R29898 Other symptoms and signs involving the musculoskeletal system: Secondary | ICD-10-CM

## 2017-12-04 DIAGNOSIS — I69354 Hemiplegia and hemiparesis following cerebral infarction affecting left non-dominant side: Secondary | ICD-10-CM | POA: Diagnosis present

## 2017-12-04 DIAGNOSIS — R2689 Other abnormalities of gait and mobility: Secondary | ICD-10-CM | POA: Insufficient documentation

## 2017-12-04 DIAGNOSIS — R278 Other lack of coordination: Secondary | ICD-10-CM

## 2017-12-04 NOTE — Therapy (Signed)
[facility] [address] [place], [place], [address] Phone: [phone]   Fax:  [phone]  Physical Therapy Evaluation  Patient Details  Name: Zachary [name] MRN: [id number] Date of Birth: 16-Jul-1971 Referring Provider: Dr. Raul Del   Encounter Date: 12/04/2017  PT End of Session - 12/04/17 1030    Visit Number  1    Number of Visits  7 2x/week for 2 weeks, then 1x/week for 2 weeks    Date for PT Re-Evaluation  01/03/18    Authorization Type  UHC    PT Start Time  0802    PT Stop Time  0835    PT Time Calculation (min)  33 min    Equipment Utilized During Treatment  -- min guard to S prn    Activity Tolerance  Patient tolerated treatment well    Behavior During Therapy  [facility] for tasks assessed/performed       Past Medical History:  Diagnosis Date  . Diabetes mellitus without complication ([place])   . Hypertension   . Stroke [facility]) 05/2017   Right Pontine Stroke    History reviewed. No pertinent surgical history.  There were no vitals filed for this visit.   Subjective Assessment - 12/04/17 0807    Subjective  Pt reported he has residual L sided weakness from R sided CVA in 2018. He had PT in the hospital and HHPT. Pt feels like his LLE is getting better but his L shoulder is still weak and painful. 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 Pt walks daily (30 minutes) lifts weights several times a week. Pt denied falls in the last 6 months. Pt is getting more sensation in his fingers.     Pertinent History  HTN, DM, Hx of CVA in 2018, HLD    Patient Stated Goals  Get more range in shoulder and be able to perform work duties/ADLs, improve balance    Currently in Pain?  No/denies         [facility] PT Assessment - 12/04/17 0813      Assessment   Medical Diagnosis  Hx of CVA with residual deficits, Hemiparesis affecting  left side as late effect of cerebrovascular accident (CVA)     Referring Provider  Dr. Raul Del    Onset Date/Surgical Date  03/18/17    Hand Dominance  Right    Prior Therapy  Inpt rehab and HHPT      Precautions   Precautions  Fall    Precaution Comments  based on FGA      Restrictions   Weight Bearing Restrictions  No      Balance Screen   Has the patient fallen in the past 6 months  No    Has the patient had a decrease in activity level because of a fear of falling?   Yes    Is the patient reluctant to leave their home because of a fear of falling?   No      Home Environment   Living Environment  Private residence    Living Arrangements  Spouse/significant other;Children kids: 69 and 18    Available Help at Discharge  Family    Type of [place] to enter    Entrance Stairs-Number of Steps  1    Entrance Stairs-Rails  None  Home Layout  Two level    Alternate Level Stairs-Number of Steps  12    Alternate Level Stairs-Rails  Right    Home Equipment  Cane - single point;Shower seat      Prior Function   Level of Independence  Independent    Vocation  Full time employment    Vocation Requirements  Seated, on the phone (at call center), typing     Leisure  hang out with family, hang out with friends, travel       Cognition   Overall Cognitive Status  Within Functional Limits for tasks assessed      Sensation   Light Touch  Impaired by gross assessment    Additional Comments  Pt reported decr. light touch in LUE/LE. Pt denied N/T, states this has improved.       Coordination   Gross Motor Movements are Fluid and Coordinated  Yes    Fine Motor Movements are Fluid and Coordinated  No    Heel Shin Test  Decr. coordination and speed with LLE. Marland Kitchen RAMs WNL.       Tone   Assessment Location  Other (comment)      Tone Assessment - Other   Other Tone Location  Pt denied spasticity but stated he gets a "charlie horse" in his L calf at night,  sometimes.       ROM / Strength   AROM / PROM / Strength  AROM;Strength      AROM   Overall AROM   Deficits    Overall AROM Comments  Decr. L shoulder AROM in all directions, unable to reach overhead. BLE ROM WNL.      Strength   Overall Strength  Deficits    Overall Strength Comments  Grossly decr. LUE strength. BLE: 4+/5, except L knee flex and hip abd/add: 3+/5.      Ambulation/Gait   Ambulation/Gait  Yes    Ambulation/Gait Assistance  5: Supervision    Ambulation/Gait Assistance Details  S to ensure safety.    Ambulation Distance (Feet)  200 Feet    Assistive device  None    Gait Pattern  Step-through pattern;Decreased arm swing - left;Decreased stride length;Decreased trunk rotation    Ambulation Surface  Level;Indoor    Gait velocity  3.71ft/sec.      Functional Gait  Assessment   Gait assessed   Yes    Gait Level Surface  Walks 20 ft in less than 7 sec but greater than 5.5 sec, uses assistive device, slower speed, mild gait deviations, or deviates 6-10 in outside of the 12 in walkway width. 6.07 sec.    Change in Gait Speed  Able to smoothly change walking speed without loss of balance or gait deviation. Deviate no more than 6 in outside of the 12 in walkway width.    Gait with Horizontal Head Turns  Performs head turns smoothly with slight change in gait velocity (eg, minor disruption to smooth gait path), deviates 6-10 in outside 12 in walkway width, or uses an assistive device.    Gait with Vertical Head Turns  Performs task with slight change in gait velocity (eg, minor disruption to smooth gait path), deviates 6 - 10 in outside 12 in walkway width or uses assistive device    Gait and Pivot Turn  Pivot turns safely within 3 sec and stops quickly with no loss of balance.    Step Over Obstacle  Is able to step over 2 stacked shoe boxes taped  together (9 in total height) without changing gait speed. No evidence of imbalance.    Gait with Narrow Base of Support  Is able to  ambulate for 10 steps heel to toe with no staggering.    Gait with Eyes Closed  Walks 20 ft, uses assistive device, slower speed, mild gait deviations, deviates 6-10 in outside 12 in walkway width. Ambulates 20 ft in less than 9 sec but greater than 7 sec. 7.96 sec.    Ambulating Backwards  Walks 20 ft, uses assistive device, slower speed, mild gait deviations, deviates 6-10 in outside 12 in walkway width.    Steps  Alternating feet, must use rail. handrail to descend    Total Score  24    FGA comment:  24/30: indicates pt is at moderate falls risk.                 Objective measurements completed on examination: See above findings.              PT Education - 12/04/17 1029    Education provided  Yes    Education Details  PT discussed referral to OT for L shoulder. PT educated pt on POC, duration, and frequency. PT explained outcome measures.     Person(s) Educated  Patient    Methods  Explanation    Comprehension  Verbalized understanding       PT Short Term Goals - 12/04/17 1035      PT SHORT TERM GOAL #1   Title  same as LTGs        PT Long Term Goals - 12/04/17 1036      PT LONG TERM GOAL #1   Title  Pt will be IND in HEP to improve balance and strength. TARGET DATE FOR ALL LTGS: 01/01/18    Status  New      PT LONG TERM GOAL #2   Title  Pt will improve FGA score to 30/30 to decr. falls risk.     Status  New      PT LONG TERM GOAL #3   Title  Pt will amb. 1000' over uneven terrain, performing head turns/nods, IND and without LOB to improve safety during functional mobility.     Status  New             Plan - 12/04/17 1031    Clinical Impression Statement  Pt is a pleasant 46y/o male presenting to OPPT neuor s/p 03/2017 CVA, with L sided weakness. Pt's PMH signicant for the following: HTN, DM, Hx of CVA in 2018, HLD. The following deficits were noted during exam: gait deviations, impaired balance, decr. L-sided strength, impaired coordination,  and impaired sensation. Pt's gait speed was WNL. However, pt's FGA score indicates pt is at moderate risk for falls. PT will send MD a note regarding OT referrla for LUE impairments. Pt would benefit from skilled PT to improve safety during functional mobility.     History and Personal Factors relevant to plan of care:  Pt works full-time and has a family to care for    Clinical Presentation  Stable    Clinical Presentation due to:  HTN, DM, Hx of CVA in 2018, HLD    Clinical Decision Making  Low    Rehab Potential  Good    Clinical Impairments Affecting Rehab Potential  see above.     PT Frequency  Other (comment) 2x/week for 2 weeks, then 1x/week for 2 weeks    PT Duration  Other (comment)    PT Treatment/Interventions  ADLs/Self Care Home Management;Biofeedback;Canalith Repostioning;Electrical Stimulation;Therapeutic activities;Functional mobility training;Therapeutic exercise;Manual techniques;Vestibular;Orthotic Fit/Training;Stair training;Gait training;Patient/family education;DME Instruction;Neuromuscular re-education;Balance training    PT Next Visit Plan  Initiate balance and strengthening HEP. Check on OT referral.    Recommended Other Services  OT eval for L shoulder    Consulted and Agree with Plan of Care  Patient       Patient will benefit from skilled therapeutic intervention in order to improve the following deficits and impairments:  Abnormal gait, Decreased endurance, Decreased knowledge of use of DME, Decreased strength, Decreased balance, Decreased mobility, Decreased coordination, Impaired sensation, Impaired flexibility  Visit Diagnosis: Other abnormalities of gait and mobility - Plan: PT plan of care cert/re-cert  Hemiplegia and hemiparesis following cerebral infarction affecting left non-dominant side ([place]) - Plan: PT plan of care cert/re-cert  Other lack of coordination - Plan: PT plan of care cert/re-cert     Problem List Patient Active Problem List   Diagnosis  Date Noted  . Adjustment disorder with mixed anxiety and depressed mood   . Sleep disturbance   . Hyperlipidemia LDL goal <70   . Hypokalemia   . Hyponatremia   . AKI (acute kidney injury) ([place])   . Diabetes mellitus type 2 in nonobese (HCC)   . Benign essential HTN   . Hemiparesis affecting left side as late effect of cerebrovascular accident (CVA) ([place]) 03/23/2017  . Gait disturbance, post-stroke 03/23/2017  . Right pontine cerebrovascular accident ([place]) 03/23/2017  . Left pontine stroke (HCC) - Right paramedian pontine infarct likely secondary to small vessel disease versus symptomatic terminal right vertebral artery stenosis. 03/18/2017    Nayah Lukens L 12/04/2017, 10:38 AM  [facility] [address] [place], [place], [address] Phone: [phone]   Fax:  [phone]  Name: Zachary [name] MRN: [id number] Date of Birth: 29-Dec-1970  Geoffry Paradise, PT,DPT 12/04/17 10:38 AM Phone: [phone] Fax: [phone]

## 2017-12-04 NOTE — Telephone Encounter (Signed)
Dr. Raul Del  I saw Zachary [name] today for a PT eval. We will address his LLE and balance impairments. However, he is also concerned with LUE ROM, strength, and fine motor skill impairments. I feel he would benefit from an OT eval to address these concerns. If you agree, please send [place] an OT referral.  Thank you, Geoffry Paradise, PT,DPT 12/04/17 10:40 AM Phone: [phone] Fax: [phone]

## 2017-12-04 NOTE — Telephone Encounter (Signed)
Referral has been placed. Thank you.

## 2017-12-05 ENCOUNTER — Ambulatory Visit: Payer: 59

## 2017-12-05 DIAGNOSIS — I69354 Hemiplegia and hemiparesis following cerebral infarction affecting left non-dominant side: Secondary | ICD-10-CM

## 2017-12-05 DIAGNOSIS — R278 Other lack of coordination: Secondary | ICD-10-CM

## 2017-12-05 DIAGNOSIS — R2689 Other abnormalities of gait and mobility: Secondary | ICD-10-CM

## 2017-12-05 NOTE — Therapy (Signed)
[facility] [address] [place], [place], [address] Phone: [phone]   Fax:  [phone]  Physical Therapy Treatment  Patient Details  Name: Zachary [name] MRN: [id number] Date of Birth: 1970/11/05 Referring Provider: Dr. Raul Del   Encounter Date: 12/05/2017  PT End of Session - 12/05/17 0930    Visit Number  2    Number of Visits  7    Date for PT Re-Evaluation  01/03/18    Authorization Type  UHC    PT Start Time  0848    PT Stop Time  0926    PT Time Calculation (min)  38 min       Past Medical History:  Diagnosis Date  . Diabetes mellitus without complication ([place])   . Hypertension   . Stroke [facility]) 05/2017   Right Pontine Stroke    History reviewed. No pertinent surgical history.  There were no vitals filed for this visit.  Subjective Assessment - 12/05/17 0849    Subjective  Pt denied changes since last visit.     Pertinent History  HTN, DM, Hx of CVA in 2018, HLD    Patient Stated Goals  Get more range in shoulder and be able to perform work duties/ADLs, improve balance    Currently in Pain?  No/denies            Therex: Performed in standing and sidelying with UE support prn. Cues and demo for technique. No pain reported during session. Please see pt instructions for HEP details. Performed with S to ensure safety.   Neuro re-ed: Performed in standing with UE support prn. Cues for technique. Please see pt instructions for HEP details. Performed with S to ensure safety.                  PT Education - 12/05/17 0930    Education provided  Yes    Education Details  PT provided pt with strengthening and balance HEP.     Person(s) Educated  Patient    Methods  Explanation;Demonstration;Tactile cues;Verbal cues;Handout    Comprehension  Returned demonstration;Verbalized understanding       PT Short Term Goals - 12/04/17 1035      PT SHORT TERM GOAL #1   Title  same as  LTGs        PT Long Term Goals - 12/04/17 1036      PT LONG TERM GOAL #1   Title  Pt will be IND in HEP to improve balance and strength. TARGET DATE FOR ALL LTGS: 01/01/18    Status  New      PT LONG TERM GOAL #2   Title  Pt will improve FGA score to 30/30 to decr. falls risk.     Status  New      PT LONG TERM GOAL #3   Title  Pt will amb. 1000' over uneven terrain, performing head turns/nods, IND and without LOB to improve safety during functional mobility.     Status  New            Plan - 12/05/17 0930    Clinical Impression Statement  Today's skilled session focused on establishing pt's strengthening and balance HEP. Pt required seated rest breaks after strengthening activities 2/2 muscular fatigue. Cues and demo required to improve lateral weight shifting unto LLE and for proper technique. Continue with POC.     Rehab Potential  Good    Clinical Impairments Affecting Rehab Potential  see above.  PT Frequency  Other (comment) 2x/week for 2 weeks, then 1x/week for 2 weeks    PT Duration  Other (comment)    PT Treatment/Interventions  ADLs/Self Care Home Management;Biofeedback;Canalith Repostioning;Electrical Stimulation;Therapeutic activities;Functional mobility training;Therapeutic exercise;Manual techniques;Vestibular;Orthotic Fit/Training;Stair training;Gait training;Patient/family education;DME Instruction;Neuromuscular re-education;Balance training    PT Next Visit Plan  Make sure pt schedule OT eval (order under other orders-Dr. Raul Del) High level balance and agility training.     Consulted and Agree with Plan of Care  Patient       Patient will benefit from skilled therapeutic intervention in order to improve the following deficits and impairments:  Abnormal gait, Decreased endurance, Decreased knowledge of use of DME, Decreased strength, Decreased balance, Decreased mobility, Decreased coordination, Impaired sensation, Impaired flexibility  Visit  Diagnosis: Other abnormalities of gait and mobility  Hemiplegia and hemiparesis following cerebral infarction affecting left non-dominant side (HCC)  Other lack of coordination     Problem List Patient Active Problem List   Diagnosis Date Noted  . Adjustment disorder with mixed anxiety and depressed mood   . Sleep disturbance   . Hyperlipidemia LDL goal <70   . Hypokalemia   . Hyponatremia   . AKI (acute kidney injury) ([place])   . Diabetes mellitus type 2 in nonobese (HCC)   . Benign essential HTN   . Hemiparesis affecting left side as late effect of cerebrovascular accident (CVA) ([place]) 03/23/2017  . Gait disturbance, post-stroke 03/23/2017  . Right pontine cerebrovascular accident ([place]) 03/23/2017  . Left pontine stroke (HCC) - Right paramedian pontine infarct likely secondary to small vessel disease versus symptomatic terminal right vertebral artery stenosis. 03/18/2017    Jasaiah Karwowski L 12/05/2017, 9:35 AM  [facility] [address] [place], [place], [address] Phone: [phone]   Fax:  [phone]  Name: Zachary [name] MRN: [id number] Date of Birth: Aug 02, 1971  Geoffry Paradise, PT,DPT 12/05/17 9:37 AM Phone: [phone] Fax: [phone]

## 2017-12-05 NOTE — Patient Instructions (Addendum)
Hold onto counter as needed for safety:  Backward    Walk backwards with eyes open. Take even 10 steps, making sure each foot lifts off floor. Repeat for __4__ laps per session. Do __1__ sessions per day.   Copyright  VHI. All rights reserved.  Walking   Walk with eyes closed at counter.  Repeat __4__ times per session. Do __1__ sessions per day.  Copyright  VHI. All rights reserved.  Feet Heel-Toe "Tandem"    Arms outstretched, walk a straight line bringing one foot directly in front of the other. Repeat for __4__ laps per session. Do __1__ sessions per day.  Copyright  VHI. All rights reserved.   Wall Squat    Feet shoulder width apart, __~12__ inches in front of wall, lean against wall. Heels on floor, knees parallel, bend hips and knees to about 45. Hold __5__ seconds. Lower slowly, return to standing by squeezing glutes. Repeat __5__ times. Do __2__ sessions. Perform 3 days a week.   Copyright  VHI. All rights reserved.   Abduction: Clam (Eccentric) - Side-Lying    Lie on side with knees bent. Lift left knee, keeping feet together. Keep trunk steady. Slowly lower back down. _10__ reps per set, __2_ sets per day, __3_ days per week. Can repeat on right side, perform 3 sets of 10 reps on right side.   http://ecce.exer.us/64   Copyright  VHI. All rights reserved.   Functional Quadriceps: Sit to Stand    Place 2 inch block under right leg. Sit on edge of chair, feet flat on floor. Stand upright, extending knees fully. Repeat __10__ times per set. Do __1__ sets per session. Do __1-2__ sessions per day.  http://orth.exer.us/734   Copyright  VHI. All rights reserved.   Heel Raise: Bilateral (Standing)    Rise on balls of feet, holding onto counter as needed. Repeat __20__ times per set. Do ___2_ sets per session (one set with LEFT foot only x10 reps). Do __3__ sessions per week.   http://orth.exer.us/38   Copyright  VHI. All rights reserved.

## 2017-12-13 ENCOUNTER — Ambulatory Visit: Payer: 59 | Attending: Nurse Practitioner

## 2017-12-13 ENCOUNTER — Other Ambulatory Visit: Payer: Self-pay | Admitting: Nurse Practitioner

## 2017-12-13 DIAGNOSIS — M6281 Muscle weakness (generalized): Secondary | ICD-10-CM | POA: Diagnosis present

## 2017-12-13 DIAGNOSIS — I69354 Hemiplegia and hemiparesis following cerebral infarction affecting left non-dominant side: Secondary | ICD-10-CM | POA: Diagnosis present

## 2017-12-13 DIAGNOSIS — M25512 Pain in left shoulder: Secondary | ICD-10-CM | POA: Insufficient documentation

## 2017-12-13 DIAGNOSIS — R278 Other lack of coordination: Secondary | ICD-10-CM | POA: Diagnosis present

## 2017-12-13 DIAGNOSIS — R2689 Other abnormalities of gait and mobility: Secondary | ICD-10-CM | POA: Insufficient documentation

## 2017-12-13 DIAGNOSIS — R208 Other disturbances of skin sensation: Secondary | ICD-10-CM | POA: Diagnosis present

## 2017-12-13 DIAGNOSIS — M25612 Stiffness of left shoulder, not elsewhere classified: Secondary | ICD-10-CM | POA: Insufficient documentation

## 2017-12-13 NOTE — Therapy (Signed)
[facility] [address] [place], [place], [address] Phone: [phone]   Fax:  [phone]  Physical Therapy Treatment  Patient Details  Name: Zachary [name] MRN: [id number] Date of Birth: [birth year] Referring Provider: Dr. Raul Del   Encounter Date: 12/13/2017  PT End of Session - 12/13/17 0844    Visit Number  3    Number of Visits  7    Date for PT Re-Evaluation  01/03/18    Authorization Type  UHC    PT Start Time  0803    PT Stop Time  0843    PT Time Calculation (min)  40 min    Equipment Utilized During Treatment  Gait belt    Activity Tolerance  Patient tolerated treatment well    Behavior During Therapy  [facility] for tasks assessed/performed       Past Medical History:  Diagnosis Date  . Diabetes mellitus without complication ([place])   . Hypertension   . Stroke [facility]) 05/2017   Right Pontine Stroke    History reviewed. No pertinent surgical history.  There were no vitals filed for this visit.  Subjective Assessment - 12/13/17 0806    Subjective  Pt    Pertinent History  HTN, DM, Hx of CVA in 2018, HLD    Patient Stated Goals  Get more range in shoulder and be able to perform work duties/ADLs, improve balance    Currently in Pain?  No/denies                       [facility] Adult PT Treatment/Exercise - 12/13/17 0832      High Level Balance   High Level Balance Activities  Braiding;Backward walking;Head turns;Marching forwards;Other (comment) forward amb.     High Level Balance Comments  Performed over red and blue mats: 4x20'activity with cues and demo for technique. Min guard to S for safety. cues to especially to improve eccentric control during marching.           Balance Exercises - 12/13/17 0806      Balance Exercises: Standing   Rockerboard  Anterior/posterior;Lateral;Head turns;EO;EC;10 seconds;30 seconds;10 reps;Intermittent UE support    Other Standing Exercises  Pt performed  in //bars with min guard to S for safety: intermittent UE support and cues/demo for technique.           PT Short Term Goals - 12/04/17 1035      PT SHORT TERM GOAL #1   Title  same as LTGs        PT Long Term Goals - 12/04/17 1036      PT LONG TERM GOAL #1   Title  Pt will be IND in HEP to improve balance and strength. TARGET DATE FOR ALL LTGS: 01/01/18    Status  New      PT LONG TERM GOAL #2   Title  Pt will improve FGA score to 30/30 to decr. falls risk.     Status  New      PT LONG TERM GOAL #3   Title  Pt will amb. 1000' over uneven terrain, performing head turns/nods, IND and without LOB to improve safety during functional mobility.     Status  New            Plan - 12/13/17 0845    Clinical Impression Statement  Pt demonstrated progress, as he tolerated high level balance activities over compliant surfaces. Pt required one seated rest break after balance  activities 2/2 fatigue. Incr. postural sway noted during SLS activities and activies which required incr. vestibular input. Continue with POC.     Rehab Potential  Good    Clinical Impairments Affecting Rehab Potential  see above.     PT Frequency  Other (comment) 2x/week for 2 weeks, then 1x/week for 2 weeks    PT Duration  Other (comment)    PT Treatment/Interventions  ADLs/Self Care Home Management;Biofeedback;Canalith Repostioning;Electrical Stimulation;Therapeutic activities;Functional mobility training;Therapeutic exercise;Manual techniques;Vestibular;Orthotic Fit/Training;Stair training;Gait training;Patient/family education;DME Instruction;Neuromuscular re-education;Balance training    PT Next Visit Plan  Make sure pt schedule OT eval (order under other orders-Dr. Raul Del) High level balance and agility training.     Consulted and Agree with Plan of Care  Patient       Patient will benefit from skilled therapeutic intervention in order to improve the following deficits and impairments:  Abnormal gait,  Decreased endurance, Decreased knowledge of use of DME, Decreased strength, Decreased balance, Decreased mobility, Decreased coordination, Impaired sensation, Impaired flexibility  Visit Diagnosis: Other abnormalities of gait and mobility  Other lack of coordination  Hemiplegia and hemiparesis following cerebral infarction affecting left non-dominant side [facility])     Problem List Patient Active Problem List   Diagnosis Date Noted  . Adjustment disorder with mixed anxiety and depressed mood   . Sleep disturbance   . Hyperlipidemia LDL goal <70   . Hypokalemia   . Hyponatremia   . AKI (acute kidney injury) ([place])   . Diabetes mellitus type 2 in nonobese (HCC)   . Benign essential HTN   . Hemiparesis affecting left side as late effect of cerebrovascular accident (CVA) ([place]) 03/23/2017  . Gait disturbance, post-stroke 03/23/2017  . Right pontine cerebrovascular accident ([place]) 03/23/2017  . Left pontine stroke (HCC) - Right paramedian pontine infarct likely secondary to small vessel disease versus symptomatic terminal right vertebral artery stenosis. 03/18/2017    Deeya Richeson L 12/13/2017, 8:46 AM  [facility] [address] [place], [place], [address] Phone: [phone]   Fax:  [phone]  Name: ALP GOLDWATER MRN: [id number] Date of Birth: 03-29-1971  Geoffry Paradise, PT,DPT 12/13/17 8:47 AM Phone: [phone] Fax: [phone]

## 2017-12-19 ENCOUNTER — Ambulatory Visit: Payer: 59

## 2017-12-20 ENCOUNTER — Ambulatory Visit: Payer: 59

## 2017-12-20 VITALS — BP 130/93 | HR 72

## 2017-12-20 DIAGNOSIS — R2689 Other abnormalities of gait and mobility: Secondary | ICD-10-CM

## 2017-12-20 DIAGNOSIS — R278 Other lack of coordination: Secondary | ICD-10-CM

## 2017-12-20 DIAGNOSIS — I69354 Hemiplegia and hemiparesis following cerebral infarction affecting left non-dominant side: Secondary | ICD-10-CM

## 2017-12-20 NOTE — Therapy (Signed)
[facility] [address] [place], [place], [address] Phone: [phone]   Fax:  [phone]  Physical Therapy Treatment  Patient Details  Name: Zachary [name] MRN: [id number] Date of Birth: 07/21/1971 Referring Provider: Dr. Raul Del   Encounter Date: 12/20/2017  PT End of Session - 12/20/17 0850    Visit Number  4    Number of Visits  7    Date for PT Re-Evaluation  01/03/18    Authorization Type  UHC    PT Start Time  0802    PT Stop Time  0844    PT Time Calculation (min)  42 min    Equipment Utilized During Treatment  Gait belt    Activity Tolerance  Patient tolerated treatment well    Behavior During Therapy  [facility] for tasks assessed/performed       Past Medical History:  Diagnosis Date  . Diabetes mellitus without complication ([place])   . Hypertension   . Stroke [facility]) 05/2017   Right Pontine Stroke    History reviewed. No pertinent surgical history.  Vitals:   12/20/17 0815 12/20/17 0821  BP: (!) 145/94 (!) 130/93  Pulse: 76 72    Subjective Assessment - 12/20/17 0804    Subjective  Pt denied falls or changes since last visit.     Pertinent History  HTN, DM, Hx of CVA in 2018, HLD    Patient Stated Goals  Get more range in shoulder and be able to perform work duties/ADLs, improve balance    Currently in Pain?  No/denies           Therex: Agility training ladder:  -two feet inside ladder then hopping to outside of ladder, then back inside ladder x6 reps (length of ladder). -lateral direction (length of ladder) x6 reps -one foot inside ladder and hopping to both feet outside ladder, then back to one foot inside ladder x4 reps (length of ladder). Pt slowly incr. Speed with each rep. Cues and demo for technique and to incr. Speed. Min guard to S for safety.  Seated rest break for 5 minutes after agility training to allow BP to decr. No c/o HA, pain, weakness,  N/T.                 Balance Exercises - 12/20/17 0821      Balance Exercises: Standing   Rockerboard  Anterior/posterior;Lateral;Head turns;EO;EC;10 seconds;30 seconds;10 reps no UE support    Other Standing Exercises  Pt performed in //bars with min guard to S for safety: intermittent UE support and cues/demo for technique.         PT Education - 12/20/17 0848    Education provided  Yes    Education Details  PT educated pt to notify us when HEP becomes too easy and we can progress it. PT educated pt on BP parameters at rest and after activity, and requested pt monitored at home as BP was slightly elevated today. Pt reported he took morning BP medications right before PT appt.     Person(s) Educated  Patient    Methods  Explanation    Comprehension  Verbalized understanding       PT Short Term Goals - 12/04/17 1035      PT SHORT TERM GOAL #1   Title  same as LTGs        PT Long Term Goals - 12/04/17 1036      PT LONG TERM GOAL #1   Title  Pt  will be IND in HEP to improve balance and strength. TARGET DATE FOR ALL LTGS: 01/01/18    Status  New      PT LONG TERM GOAL #2   Title  Pt will improve FGA score to 30/30 to decr. falls risk.     Status  New      PT LONG TERM GOAL #3   Title  Pt will amb. 1000' over uneven terrain, performing head turns/nods, IND and without LOB to improve safety during functional mobility.     Status  New            Plan - 12/20/17 6761    Clinical Impression Statement  Pt continues to demonstrate progress, as he tolerated rockerboard with less postural sway and LOB. Pt also performed agility ladder training to improve speed, strength, and endurance. However, pt's BP was slightly elevated after agility training and decr. with 5 minute seated rest break. Pt would continue to benefit from skilled PT to improve safety during functional mobility. PT encouraged pt to schedule OT eval for L UE deficits.     Rehab Potential  Good     Clinical Impairments Affecting Rehab Potential  see above.     PT Frequency  Other (comment) 2x/week for 2 weeks, then 1x/week for 2 weeks    PT Duration  Other (comment)    PT Treatment/Interventions  ADLs/Self Care Home Management;Biofeedback;Canalith Repostioning;Electrical Stimulation;Therapeutic activities;Functional mobility training;Therapeutic exercise;Manual techniques;Vestibular;Orthotic Fit/Training;Stair training;Gait training;Patient/family education;DME Instruction;Neuromuscular re-education;Balance training    PT Next Visit Plan  Continue High level balance and agility training.     Consulted and Agree with Plan of Care  Patient       Patient will benefit from skilled therapeutic intervention in order to improve the following deficits and impairments:  Abnormal gait, Decreased endurance, Decreased knowledge of use of DME, Decreased strength, Decreased balance, Decreased mobility, Decreased coordination, Impaired sensation, Impaired flexibility  Visit Diagnosis: Other abnormalities of gait and mobility  Other lack of coordination  Hemiplegia and hemiparesis following cerebral infarction affecting left non-dominant side [facility])     Problem List Patient Active Problem List   Diagnosis Date Noted  . Adjustment disorder with mixed anxiety and depressed mood   . Sleep disturbance   . Hyperlipidemia LDL goal <70   . Hypokalemia   . Hyponatremia   . AKI (acute kidney injury) ([place])   . Diabetes mellitus type 2 in nonobese (HCC)   . Benign essential HTN   . Hemiparesis affecting left side as late effect of cerebrovascular accident (CVA) ([place]) 03/23/2017  . Gait disturbance, post-stroke 03/23/2017  . Right pontine cerebrovascular accident ([place]) 03/23/2017  . Left pontine stroke (HCC) - Right paramedian pontine infarct likely secondary to small vessel disease versus symptomatic terminal right vertebral artery stenosis. 03/18/2017    Miller,Jennifer L 12/20/2017, 8:59 AM  [facility] [address] [place], [place], [address] Phone: [phone]   Fax:  [phone]  Name: Zachary [name] MRN: [id number] Date of Birth: [birth year]  Geoffry Paradise, PT,DPT 12/20/17 8:59 AM Phone: [phone] Fax: [phone]

## 2017-12-25 ENCOUNTER — Ambulatory Visit: Payer: 59 | Admitting: Occupational Therapy

## 2017-12-25 DIAGNOSIS — R208 Other disturbances of skin sensation: Secondary | ICD-10-CM

## 2017-12-25 DIAGNOSIS — M25612 Stiffness of left shoulder, not elsewhere classified: Secondary | ICD-10-CM

## 2017-12-25 DIAGNOSIS — M25512 Pain in left shoulder: Secondary | ICD-10-CM

## 2017-12-25 DIAGNOSIS — M6281 Muscle weakness (generalized): Secondary | ICD-10-CM

## 2017-12-25 DIAGNOSIS — I69354 Hemiplegia and hemiparesis following cerebral infarction affecting left non-dominant side: Secondary | ICD-10-CM

## 2017-12-25 DIAGNOSIS — R2689 Other abnormalities of gait and mobility: Secondary | ICD-10-CM | POA: Diagnosis not present

## 2017-12-25 DIAGNOSIS — R278 Other lack of coordination: Secondary | ICD-10-CM

## 2017-12-25 NOTE — Therapy (Signed)
[facility] [address] [place], [place], [address] Phone: [phone]   Fax:  [phone]  Occupational Therapy Evaluation  Patient Details  Name: Zachary [name] MRN: [id number] Date of Birth: 09-24-1970 Referring Provider: Dr. [name]   Encounter Date: 12/25/2017  OT End of Session - 12/25/17 0850    Visit Number  1    Number of Visits  17    Date for OT Re-Evaluation  02/24/18    Authorization Type  UHC    Authorization - Visit Number  1    Authorization - Number of Visits  23    OT Start Time  0800    OT Stop Time  0845    OT Time Calculation (min)  45 min    Activity Tolerance  Patient tolerated treatment well    Behavior During Therapy  [facility] for tasks assessed/performed       Past Medical History:  Diagnosis Date  . Diabetes mellitus without complication ([place])   . Hypertension   . Stroke [facility]) 05/2017   Right Pontine Stroke    No past surgical history on file.  There were no vitals filed for this visit.  Subjective Assessment - 12/25/17 0801    Pertinent History  CVA 03/2017, HTN, DM    Limitations  fall risk     Patient Stated Goals  Improve ROM and strength Lt shoulder, and get my Lt hand better at typing    Currently in Pain?  No/denies Pt however did have some pain with shoulder IR        [facility] OT Assessment - 12/25/17 0001      Assessment   Medical Diagnosis  Hx of CVA with residual deficits, Hemiparesis affecting left side as late effect of cerebrovascular accident (CVA)  diagnosis from MD "Abnormal motor coordination"    Referring Provider  Dr. [name]    Onset Date/Surgical Date  03/18/17    Hand Dominance  Right    Prior Therapy  Inpt rehab and HHPT      Precautions   Precautions  Fall      Restrictions   Weight Bearing Restrictions  No      Balance Screen   Has the patient fallen in the past 6 months  -- Refer to P.T. evaluation      Home  Environment   Bathroom  Shower/Tub  Tub/Shower [employer] - single point;Shower seat    Additional Comments  Pt lives in 2 story home with 1 step to enter home, 12 steps to 2nd floor.     Lives With  Family wife and 2 kids      Prior Function   Level of Independence  Independent    Vocation  Full time employment    Vocation Requirements  Seated, on the phone (at call center), typing     Leisure  hang out with family, hang out with friends, travel       ADL   Eating/Feeding  Modified independent difficulty cutting food    Grooming  Independent    Upper Body Bathing  Independent    Lower Body Bathing  Independent    Upper Body Dressing  Increased time    Lower Body Dressing  Modified independent    Banker - Astronomer -  Forensic scientist  IADL   Shopping  -- wife mostly does, but pt can do    Light Housekeeping  Does personal laundry completely;Performs light daily tasks such as dishwashing, bed making    Meal Prep  Able to complete simple warm meal prep;Able to complete simple cold meal and snack prep wife does most cooking    Investment banker, corporate own vehicle    Medication Management  Is responsible for taking medication in correct dosages at correct time    Psychiatrist financial matters independently (budgets, writes checks, pays rent, bills goes to [employer]), collects and keeps track of income      Mobility   Mobility Status  Independent      Written Expression   Dominant Hand  Right    Handwriting  -- no changes since RT handed      Vision - History   Baseline Vision  No visual deficits    Additional Comments  pt reports initial bluriness from stroke but resolved      Cognition   Cognition Comments  Pt reports initial short term memory deficits from stroke but has resolved      Observation/Other Assessments   Observations  weak  proximal shoulder with pain during IR      Sensation   Light Touch  Appears Intact Lt hand w/ testing    Stereognosis  Impaired Detail    Stereognosis Impaired Details  Absent LUE    Hot/Cold  Impaired by gross assessment diminished LUE    Additional Comments  Pt reports decr. ability to grade pressure Lt hand.      Coordination   9 Hole Peg Test  Right;Left    Right 9 Hole Peg Test  25.41 sec    Left 9 Hole Peg Test  95.53 sec    Coordination  isolated finger extension Lt hand for typing but slow and pt reports he often has trouble typing and has to force Lt hand to help with typing (for work related tasks)       Edema   Edema  none during evaluation      AROM   Overall AROM   Deficits    Overall AROM Comments  RUE AROM WNLs. LUE sh. flex/abd = 90*, ER approx 75%, IR approx 25% w/ pain, elbow distally WFL's      Strength   Overall Strength  Deficits    Overall Strength Comments  LUE grossly 3-/5 at shoulder      Hand Function   Right Hand Grip (lbs)  103 lbs    Left Hand Grip (lbs)  51 lbs                      OT Education - 12/25/17 0847    Education provided  Yes    Education Details  Lt handed typing words, isolated finger tapping ex's for typing    Person(s) Educated  Patient    Methods  Explanation;Demonstration;Handout    Comprehension  Verbalized understanding       OT Short Term Goals - 12/25/17 0857      OT SHORT TERM GOAL #1   Title  Independent with HEP for Lt shoulder, coordination, and grip strength    Time  4    Period  Weeks    Status  New    Target Date  01/25/18      OT SHORT TERM GOAL #2   Title  Pt to verbalize understanding with safety  considerations d/t lack of sensation Lt hand    Time  4    Period  Weeks    Status  New      OT SHORT TERM GOAL #3   Title  Pt to report greater ease with typing with task modifications/adaptations prn    Time  4    Period  Weeks    Status  New      OT SHORT TERM GOAL #4   Title  Pt to  improve coordination Lt hand as evidenced by decreasing 9 hole peg test by 10 sec. or more     Baseline  eval: 95.53 sec    Time  4    Period  Weeks    Status  New      OT SHORT TERM GOAL #5   Title  Improve grip strength by 10 lbs Lt hand     Baseline  eval: 51 lbs (Rt = 103 lbs)     Time  4    Period  Weeks    Status  New        OT Long Term Goals - 12/25/17 0630      OT LONG TERM GOAL #1   Title  Independent with strengthening HEP for Lt shoulder     Time  8    Period  Weeks    Status  New    Target Date  02/24/18      OT LONG TERM GOAL #2   Title  Pt to perform overhead reaching LUE requiring 115* shoulder flexion to retrieve lightweight object from cabinet    Time  8    Period  Weeks    Status  New      OT LONG TERM GOAL #3   Title  Pt to improve coordination Lt hand as evidenced by decreasing speed on 9 hole peg test by 20 sec. or more    Baseline  eval: 95.53 sec.     Time  8    Period  Weeks    Status  New      OT LONG TERM GOAL #4   Title  Pt to improve grip strength Lt hand by 15 lbs     Baseline  eval: 51 lbs    Time  8    Period  Weeks    Status  New            Plan - 12/25/17 0853    Clinical Impression Statement  Pt is a 47 y.o. male who presents to outpatient O.T. for evaluation with diagnosis: abnormal motor coordination, with residual Lt sided weakness s/p CVA 03/2017. Pt presents today with decreased ROM and strength Lt shoulder, decreased coordination Lt hand, decreased grip strength, and decreased sensation. Pt also has occasional pain Lt shoulder especially with IR.     Occupational Profile and client history currently impacting functional performance  CVA 03/2017, HTN, DM. Current deficits affecting job especially typing tasks    Occupational performance deficits (Please refer to evaluation for details):  ADL's;IADL's;Work;Leisure;Social Participation    Rehab Potential  Good    Current Impairments/barriers affecting progress:  time since  onset    OT Frequency  2x / week    OT Duration  8 weeks plus evaluation    OT Treatment/Interventions  Self-care/ADL training;Moist Heat;Fluidtherapy;DME and/or AE instruction;Therapeutic activities;Therapeutic exercise;Coping strategies training;Neuromuscular education;Functional Mobility Training;Passive range of motion;Patient/family education;Manual Therapy    Plan  HEP for Lt shoulder ROM, Lt hand coordination, and grip  strength    Clinical Decision Making  Several treatment options, min-mod task modification necessary       Patient will benefit from skilled therapeutic intervention in order to improve the following deficits and impairments:  Decreased coordination, Decreased range of motion, Impaired flexibility, Decreased endurance, Impaired sensation, Decreased activity tolerance, Decreased knowledge of precautions, Impaired UE functional use, Pain, Decreased mobility, Decreased strength  Visit Diagnosis: Hemiplegia and hemiparesis following cerebral infarction affecting left non-dominant side (HCC) - Plan: Ot plan of care cert/re-cert  Other lack of coordination - Plan: Ot plan of care cert/re-cert  Muscle weakness (generalized) - Plan: Ot plan of care cert/re-cert  Stiffness of left shoulder, not elsewhere classified - Plan: Ot plan of care cert/re-cert  Left shoulder pain, unspecified chronicity - Plan: Ot plan of care cert/re-cert  Other disturbances of skin sensation - Plan: Ot plan of care cert/re-cert    Problem List Patient Active Problem List   Diagnosis Date Noted  . Adjustment disorder with mixed anxiety and depressed mood   . Sleep disturbance   . Hyperlipidemia LDL goal <70   . Hypokalemia   . Hyponatremia   . AKI (acute kidney injury) ([place])   . Diabetes mellitus type 2 in nonobese (HCC)   . Benign essential HTN   . Hemiparesis affecting left side as late effect of cerebrovascular accident (CVA) ([place]) 03/23/2017  . Gait disturbance, post-stroke 03/23/2017   . Right pontine cerebrovascular accident ([place]) 03/23/2017  . Left pontine stroke (HCC) - Right paramedian pontine infarct likely secondary to small vessel disease versus symptomatic terminal right vertebral artery stenosis. 03/18/2017    Carey Bullocks, OTR/L 12/25/2017, 9:09 AM  [facility] [address] [place], [place], [address] Phone: [phone]   Fax:  [phone]  Name: LEWELLYN FULTZ MRN: [id number] Date of Birth: 25-Feb-1971

## 2017-12-29 ENCOUNTER — Ambulatory Visit: Payer: 59 | Admitting: Rehabilitation

## 2017-12-29 ENCOUNTER — Encounter: Payer: Self-pay | Admitting: Rehabilitation

## 2017-12-29 DIAGNOSIS — R2689 Other abnormalities of gait and mobility: Secondary | ICD-10-CM | POA: Diagnosis not present

## 2017-12-29 DIAGNOSIS — R278 Other lack of coordination: Secondary | ICD-10-CM

## 2017-12-29 DIAGNOSIS — I69354 Hemiplegia and hemiparesis following cerebral infarction affecting left non-dominant side: Secondary | ICD-10-CM

## 2017-12-29 DIAGNOSIS — M6281 Muscle weakness (generalized): Secondary | ICD-10-CM

## 2017-12-29 NOTE — Therapy (Signed)
[facility] [address] [place], [place], [address] Phone: [phone]   Fax:  [phone]  Physical Therapy Treatment and D/C Summary   Patient Details  Name: Zachary [name] MRN: [id number] Date of Birth: [birth year] Referring Provider: Dr. [name]   Encounter Date: 12/29/2017  PT End of Session - 12/29/17 0848    Visit Number  5    Number of Visits  7    Date for PT Re-Evaluation  01/03/18    Authorization Type  UHC    PT Start Time  0848    PT Stop Time  0930    PT Time Calculation (min)  42 min    Equipment Utilized During Treatment  Gait belt    Activity Tolerance  Patient tolerated treatment well    Behavior During Therapy  [facility] for tasks assessed/performed       Past Medical History:  Diagnosis Date  . Diabetes mellitus without complication ([place])   . Hypertension   . Stroke [facility]) 05/2017   Right Pontine Stroke    History reviewed. No pertinent surgical history.  There were no vitals filed for this visit.  Subjective Assessment - 12/29/17 0847    Subjective  No changes, reports work is going well.  Still having trouble with LUE.      Pertinent History  HTN, DM, Hx of CVA in 2018, HLD    Patient Stated Goals  Get more range in shoulder and be able to perform work duties/ADLs, improve balance    Currently in Pain?  No/denies         [facility] PT Assessment - 12/29/17 0854      Ambulation/Gait   Ambulation/Gait  Yes    Ambulation/Gait Assistance  7: Independent    Ambulation/Gait Assistance Details  Pt able to ambulate over varying outdoor surfaces indoors and outdoors (grass/paved/unlevel/uphill/downhill) at independent level while performing head turns over grass without LOB.  Mild postural sway but no overt LOB.      Ambulation Distance (Feet)  1200 Feet    Assistive device  None    Gait Pattern  Within Functional Limits    Ambulation Surface  Level;Unlevel;Indoor;Outdoor;Paved;Gravel;Grass     Stairs  Yes    Stairs Assistance  7: Independent    Stair Management Technique  No rails;Alternating pattern;Forwards    Number of Stairs  4    Height of Stairs  6      Functional Gait  Assessment   Gait assessed   Yes    Gait Level Surface  Walks 20 ft in less than 5.5 sec, no assistive devices, good speed, no evidence for imbalance, normal gait pattern, deviates no more than 6 in outside of the 12 in walkway width.    Change in Gait Speed  Able to smoothly change walking speed without loss of balance or gait deviation. Deviate no more than 6 in outside of the 12 in walkway width.    Gait with Horizontal Head Turns  Performs head turns smoothly with no change in gait. Deviates no more than 6 in outside 12 in walkway width    Gait with Vertical Head Turns  Performs head turns with no change in gait. Deviates no more than 6 in outside 12 in walkway width.    Gait and Pivot Turn  Pivot turns safely within 3 sec and stops quickly with no loss of balance.    Step Over Obstacle  Is able to step over 2 stacked shoe  boxes taped together (9 in total height) without changing gait speed. No evidence of imbalance.    Gait with Narrow Base of Support  Is able to ambulate for 10 steps heel to toe with no staggering.    Gait with Eyes Closed  Walks 20 ft, no assistive devices, good speed, no evidence of imbalance, normal gait pattern, deviates no more than 6 in outside 12 in walkway width. Ambulates 20 ft in less than 7 sec.    Ambulating Backwards  Walks 20 ft, no assistive devices, good speed, no evidence for imbalance, normal gait    Steps  Alternating feet, no rail.    Total Score  30                   [facility] Adult PT Treatment/Exercise - 12/29/17 0854      Ambulation/Gait   Curb  7: Independent             PT Education - 12/29/17 1018    Education provided  Yes    Education Details  progress with goals, D/C today, updated HEP    Person(s) Educated  Patient    Methods   Explanation;Demonstration;Handout    Comprehension  Verbalized understanding;Returned demonstration       PT Short Term Goals - 12/04/17 1035      PT SHORT TERM GOAL #1   Title  same as LTGs        PT Long Term Goals - 12/29/17 0848      PT LONG TERM GOAL #1   Title  Pt will be IND in HEP to improve balance and strength. TARGET DATE FOR ALL LTGS: 01/01/18    Status  Achieved      PT LONG TERM GOAL #2   Title  Pt will improve FGA score to 30/30 to decr. falls risk.     Baseline  met on 12/29/17    Status  Achieved      PT LONG TERM GOAL #3   Title  Pt will amb. 1000' over uneven terrain, performing head turns/nods, IND and without LOB to improve safety during functional mobility.     Baseline  met 12/29/17    Status  Achieved            Plan - 12/29/17 0848    Clinical Impression Statement  Skilled session focused on assessment of LTGs due to pts reported progress with mobility at home/in community.  Pt has met 3/3 LTGS today (did update HEP to increase challenge).  Pt ready for D/C therefore cancelled next week's appt.     Rehab Potential  Good    Clinical Impairments Affecting Rehab Potential  see above.     PT Frequency  Other (comment) 2x/week for 2 weeks, then 1x/week for 2 weeks    PT Duration  Other (comment)    PT Treatment/Interventions  ADLs/Self Care Home Management;Biofeedback;Canalith Repostioning;Electrical Stimulation;Therapeutic activities;Functional mobility training;Therapeutic exercise;Manual techniques;Vestibular;Orthotic Fit/Training;Stair training;Gait training;Patient/family education;DME Instruction;Neuromuscular re-education;Balance training    PT Next Visit Plan  --    Consulted and Agree with Plan of Care  Patient       Patient will benefit from skilled therapeutic intervention in order to improve the following deficits and impairments:  Abnormal gait, Decreased endurance, Decreased knowledge of use of DME, Decreased strength, Decreased balance,  Decreased mobility, Decreased coordination, Impaired sensation, Impaired flexibility  Visit Diagnosis: Hemiplegia and hemiparesis following cerebral infarction affecting left non-dominant side (HCC)  Other lack of coordination  Muscle  weakness (generalized)  Other abnormalities of gait and mobility  PHYSICAL THERAPY DISCHARGE SUMMARY  Visits from Start of Care: 5  Current functional level related to goals / functional outcomes: See LTGs   Remaining deficits: Pt with more deficits related to LUE, will be starting follow up OT next week.     Education / Equipment: HEP  Plan: Patient agrees to discharge.  Patient goals were met. Patient is being discharged due to meeting the stated rehab goals.  ?????        Problem List Patient Active Problem List   Diagnosis Date Noted  . Adjustment disorder with mixed anxiety and depressed mood   . Sleep disturbance   . Hyperlipidemia LDL goal <70   . Hypokalemia   . Hyponatremia   . AKI (acute kidney injury) ([place])   . Diabetes mellitus type 2 in nonobese (HCC)   . Benign essential HTN   . Hemiparesis affecting left side as late effect of cerebrovascular accident (CVA) ([place]) 03/23/2017  . Gait disturbance, post-stroke 03/23/2017  . Right pontine cerebrovascular accident ([place]) 03/23/2017  . Left pontine stroke (HCC) - Right paramedian pontine infarct likely secondary to small vessel disease versus symptomatic terminal right vertebral artery stenosis. 03/18/2017    Cameron Sprang, PT, MPT [facility] [address] [place], [place], [address] Phone: [phone]   Fax:  [phone] 12/29/17, 10:22 AM  Name: Zachary [name] MRN: [id number] Date of Birth: 09-17-1970

## 2017-12-29 NOTE — Patient Instructions (Addendum)
Heel Raise: Unilateral (Standing)    Balance on left foot, then rise on ball of foot. Repeat __10-15__ times per set. Do __2__ sets per session. Do _2___ sessions per day.  http://orth.exer.us/40   Copyright  VHI. All rights reserved.    Wall Squat    Feet shoulder width apart, __~12__ inches in front of wall (make sure feet are not too close to wall-should be able to see toes), lean against wall. Heels on floor, knees parallel, bend hips and knees to about 75-80. Hold __10__ seconds. Lower slowly, return to standing by squeezing glutes. Repeat __5__ times. Do __2__ sessions. Perform 3 days a week.     Functional Quadriceps: Sit to Stand    Place 2 inch block under right leg. Sit on edge of chair, feet flat on floor. Stand upright, extending knees fully.  To increase challenge, do from lower surface or softer surface like couch.  Do slowly without using hands.   Repeat __10__ times per set. Do __1__ sets per session. Do __1-2__ sessions per day.   For these: Do in corner with chair in front of you for support.  Get pillow that is just thick enough to take solid ground out of picture.   Feet Together (Compliant Surface) Arm Motion - Eyes Closed    Stand on compliant surface: ___pillow_____ with feet together. Close eyes and keep arms by your side.  Repeat __3__ times per session for 20 secs each. Do _2___ sessions per day.  Copyright  VHI. All rights reserved.   Feet Together (Compliant Surface) Head Motion - Eyes Closed    Stand on compliant surface: ___pillow_____ with feet together. Close eyes and move head slowly, up and down x 10 reps, side to side x 10 reps.   Repeat _1___ times per session. Do __2__ sessions per day.  Copyright  VHI. All rights reserved.     SINGLE LIMB STANCE    Stance in corner on pillow: single leg on floor. Raise leg. Hold _15-20__ seconds. Repeat with other leg. _3__ reps per set, _1-2__ sets per day, _5-7__ days per  week  Copyright  VHI. All rights reserved.   HIP: Abduction - Side-Lying    Lie on side, legs straight and in line with trunk. Squeeze glutes. Raise top leg up and slightly back. Point toes forward. _10__ reps per set, 1_ sets per day, _5-7__ days per week Bend bottom leg to stabilize pelvis.  Copyright  VHI. All rights reserved.

## 2018-01-02 ENCOUNTER — Ambulatory Visit: Payer: 59

## 2018-01-03 ENCOUNTER — Ambulatory Visit: Payer: 59 | Admitting: Occupational Therapy

## 2018-01-04 ENCOUNTER — Encounter: Payer: 59 | Admitting: *Deleted

## 2018-01-10 ENCOUNTER — Ambulatory Visit: Payer: 59 | Admitting: Occupational Therapy

## 2018-01-10 DIAGNOSIS — M6281 Muscle weakness (generalized): Secondary | ICD-10-CM

## 2018-01-10 DIAGNOSIS — I69354 Hemiplegia and hemiparesis following cerebral infarction affecting left non-dominant side: Secondary | ICD-10-CM

## 2018-01-10 DIAGNOSIS — M25612 Stiffness of left shoulder, not elsewhere classified: Secondary | ICD-10-CM

## 2018-01-10 DIAGNOSIS — R2689 Other abnormalities of gait and mobility: Secondary | ICD-10-CM | POA: Diagnosis not present

## 2018-01-10 DIAGNOSIS — R278 Other lack of coordination: Secondary | ICD-10-CM

## 2018-01-10 NOTE — Patient Instructions (Addendum)
  Coordination Activities  Perform the following activities for 15 minutes 1-2 times per day with left hand(s).   Rotate ball in fingertips (clockwise and counter-clockwise).  Toss ball in air and catch with the same hand.  Flip cards 1 at a time as fast as you can.  Deal cards with your thumb (Hold deck in hand and push card off top with thumb).  Rotate 1 card in hand (clockwise and counter-clockwise).  Pick up coins one at a time until you get 5 in your hand, then move coins from palm to fingertips to stack one at a time.  Practice typing.  1. Grip Strengthening (Resistive Putty)   Squeeze putty using thumb and all fingers. Repeat _15-20___ times. Do __2-3_ sessions per day.   2. Roll putty into tube on table and pinch between each finger and thumb x 10 reps each. (Do ring and small finger together)    Cranial Flexion: Overhead Arm Extension - Supine (Medicine Diona Foley)    Lie with knees bent, arms beyond head, holding __shoe box or paper towel roll. Pull ball up to above face as able. KEEP ELBOWS STRAIGHT Repeat __10__ times per set. Do __1__ sets per session. Do __2__ sessions per day.   Scapular Retraction (Prone)    Lie with arms at sides. Pinch shoulder blades together and raise arms a few inches from floor. Do NOT lift shoulders towards ears, only towards ceiling Repeat _10___ times per set. Do __1 sets per session. Do __2__ sessions per day.  Extension - Prone (Dumbbell)    Lie with left arm hanging off side of bed. Lift hand back and up. Repeat _10___ times per set. Do _1___ sets per session. Do __2__ sessions per day. NO weights  SELF ASSISTED WITH OBJECT: Shoulder Flexion - Sitting    Hold cane with both hands. Raise arms up to eye level. __10_ reps per set, _2-3__ sets per day  ROM: Abduction - Wand    Holding wand with left hand palm up, push wand directly out to side, leading with other hand palm down, until stretch is felt. Hold _3___  seconds. Repeat __10__ times per set. Do _1___ sets per session. Do __2-3__ sessions per day.

## 2018-01-10 NOTE — Therapy (Signed)
[facility] [address] [place], [place], [address] Phone: [phone]   Fax:  [phone]  Occupational Therapy Treatment  Patient Details  Name: Zachary [name] MRN: [id number] Date of Birth: [birth year] Referring Provider: Dr. [name]   Encounter Date: 01/10/2018  OT End of Session - 01/10/18 1322    Visit Number  2    Number of Visits  17    Date for OT Re-Evaluation  02/24/18    Authorization Type  UHC    Authorization - Visit Number  2    Authorization - Number of Visits  23    OT Start Time  1232    OT Stop Time  1320    OT Time Calculation (min)  48 min    Activity Tolerance  Patient tolerated treatment well    Behavior During Therapy  [facility] for tasks assessed/performed       Past Medical History:  Diagnosis Date  . Diabetes mellitus without complication ([place])   . Hypertension   . Stroke [facility]) 05/2017   Right Pontine Stroke    No past surgical history on file.  There were no vitals filed for this visit.  Subjective Assessment - 01/10/18 1235    Subjective   I've been doing some of the typing words    Pertinent History  CVA 03/2017, HTN, DM    Limitations  fall risk     Patient Stated Goals  Improve ROM and strength Lt shoulder, and get my Lt hand better at typing    Currently in Pain?  No/denies                   OT Treatments/Exercises (OP) - 01/10/18 0001      ADLs   ADL Comments  See pt instructions for details on HEP's issued today. Pt performed each as instructed x 10 reps             OT Education - 01/10/18 1311    Education provided  Yes    Education Details  coordination and putty HEP, Lt shoulder HEP    Person(s) Educated  Patient    Methods  Explanation;Demonstration;Handout    Comprehension  Verbalized understanding;Returned demonstration       OT Short Term Goals - 01/10/18 1323      OT SHORT TERM GOAL #1   Title  Independent with HEP for Lt  shoulder, coordination, and grip strength    Time  4    Period  Weeks    Status  On-going      OT SHORT TERM GOAL #2   Title  Pt to verbalize understanding with safety considerations d/t lack of sensation Lt hand    Time  4    Period  Weeks    Status  New      OT SHORT TERM GOAL #3   Title  Pt to report greater ease with typing with task modifications/adaptations prn    Time  4    Period  Weeks    Status  On-going      OT SHORT TERM GOAL #4   Title  Pt to improve coordination Lt hand as evidenced by decreasing 9 hole peg test by 10 sec. or more     Baseline  eval: 95.53 sec    Time  4    Period  Weeks    Status  On-going      OT SHORT TERM GOAL #5   Title  Improve grip strength by 10 lbs Lt hand     Baseline  eval: 51 lbs (Rt = 103 lbs)     Time  4    Period  Weeks    Status  On-going        OT Long Term Goals - 12/25/17 2426      OT LONG TERM GOAL #1   Title  Independent with strengthening HEP for Lt shoulder     Time  8    Period  Weeks    Status  New    Target Date  02/24/18      OT LONG TERM GOAL #2   Title  Pt to perform overhead reaching LUE requiring 115* shoulder flexion to retrieve lightweight object from cabinet    Time  8    Period  Weeks    Status  New      OT LONG TERM GOAL #3   Title  Pt to improve coordination Lt hand as evidenced by decreasing speed on 9 hole peg test by 20 sec. or more    Baseline  eval: 95.53 sec.     Time  8    Period  Weeks    Status  New      OT LONG TERM GOAL #4   Title  Pt to improve grip strength Lt hand by 15 lbs     Baseline  eval: 51 lbs    Time  8    Period  Weeks    Status  New            Plan - 01/10/18 1323    Clinical Impression Statement  Pt progressing and tolerating HEP's well. Noted elevated scapula on Lt    Occupational Profile and client history currently impacting functional performance  CVA 03/2017, HTN, DM. Current deficits affecting job especially typing tasks    Occupational performance  deficits (Please refer to evaluation for details):  ADL's;IADL's;Work;Leisure;Social Participation    Rehab Potential  Good    OT Frequency  2x / week    OT Duration  8 weeks    OT Treatment/Interventions  Self-care/ADL training;Moist Heat;Fluidtherapy;DME and/or AE instruction;Therapeutic activities;Therapeutic exercise;Coping strategies training;Neuromuscular education;Functional Mobility Training;Passive range of motion;Patient/family education;Manual Therapy    Plan  continue progress towards STG's    Consulted and Agree with Plan of Care  Patient       Patient will benefit from skilled therapeutic intervention in order to improve the following deficits and impairments:  Decreased coordination, Decreased range of motion, Impaired flexibility, Decreased endurance, Impaired sensation, Decreased activity tolerance, Decreased knowledge of precautions, Impaired UE functional use, Pain, Decreased mobility, Decreased strength  Visit Diagnosis: Hemiplegia and hemiparesis following cerebral infarction affecting left non-dominant side (HCC)  Other lack of coordination  Muscle weakness (generalized)  Stiffness of left shoulder, not elsewhere classified    Problem List Patient Active Problem List   Diagnosis Date Noted  . Adjustment disorder with mixed anxiety and depressed mood   . Sleep disturbance   . Hyperlipidemia LDL goal <70   . Hypokalemia   . Hyponatremia   . AKI (acute kidney injury) ([place])   . Diabetes mellitus type 2 in nonobese (HCC)   . Benign essential HTN   . Hemiparesis affecting left side as late effect of cerebrovascular accident (CVA) ([place]) 03/23/2017  . Gait disturbance, post-stroke 03/23/2017  . 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 Left pontine stroke ([place]) - Right paramedian pontine infarct likely secondary to small vessel  disease versus symptomatic terminal right vertebral artery stenosis. 03/18/2017    Carey Bullocks,  OTR/L 01/10/2018, 1:24 PM  [facility] [address] [place], [place], [address] Phone: [phone]   Fax:  [phone]  Name: Zachary [name] MRN: [id number] Date of Birth: 06/06/1971

## 2018-01-11 ENCOUNTER — Ambulatory Visit: Payer: 59 | Admitting: Occupational Therapy

## 2018-01-11 DIAGNOSIS — R278 Other lack of coordination: Secondary | ICD-10-CM

## 2018-01-11 DIAGNOSIS — R2689 Other abnormalities of gait and mobility: Secondary | ICD-10-CM | POA: Diagnosis not present

## 2018-01-11 DIAGNOSIS — I69354 Hemiplegia and hemiparesis following cerebral infarction affecting left non-dominant side: Secondary | ICD-10-CM

## 2018-01-11 DIAGNOSIS — M25612 Stiffness of left shoulder, not elsewhere classified: Secondary | ICD-10-CM

## 2018-01-11 DIAGNOSIS — M6281 Muscle weakness (generalized): Secondary | ICD-10-CM

## 2018-01-11 NOTE — Therapy (Signed)
[facility] [address] [place], [place], [address] Phone: [phone]   Fax:  [phone]  Occupational Therapy Treatment  Patient Details  Name: Zachary [name] MRN: [id number] Date of Birth: Aug 27, 1970 Referring Provider: Dr. [name]   Encounter Date: 01/11/2018  OT End of Session - 01/11/18 0845    Visit Number  3    Number of Visits  17    Date for OT Re-Evaluation  02/24/18    Authorization Type  UHC    Authorization - Visit Number  3    Authorization - Number of Visits  23    OT Start Time  0800    OT Stop Time  0845    OT Time Calculation (min)  45 min    Activity Tolerance  Patient tolerated treatment well    Behavior During Therapy  [facility] for tasks assessed/performed       Past Medical History:  Diagnosis Date  . Diabetes mellitus without complication ([place])   . Hypertension   . Stroke [facility]) 05/2017   Right Pontine Stroke    No past surgical history on file.  There were no vitals filed for this visit.  Subjective Assessment - 01/11/18 0800    Subjective   The shoulder ex's felt fine.     Pertinent History  CVA 03/2017, HTN, DM    Limitations  fall risk     Patient Stated Goals  Improve ROM and strength Lt shoulder, and get my Lt hand better at typing    Currently in Pain?  No/denies                   OT Treatments/Exercises (OP) - 01/11/18 0001      Exercises   Exercises  Hand;Shoulder      Shoulder Exercises: ROM/Strengthening   UBE (Upper Arm Bike)  UBE x 5 min. for reciprocal movement pattern      Neurological Re-education Exercises   Other Exercises 1  AA/ROM LUE in sh. flexion using UE Ranger in standing with therapist providing facilitation to Lt scapula - pt w/ elevated scapula and goes into sh. hiking at higher ranges.     Other Exercises 2  Chair push ups x 10 for scapula depression    Other Weight-Bearing Exercises 1  Prone on elbows for chest lift and scapula  depression x 10 reps, holding 5 sec.     Other Weight-Bearing Exercises 2  Quadraped: cat/cow stretch and A/P wt shifts w/ mod difficulty LUE and therapist supporting sh. girdle      Fine Motor Coordination (Hand/Wrist)   Fine Motor Coordination  Small Pegboard;In hand manipuation training    In Hand Manipulation Training  Translating stress balls in Lt hand with mod difficulty, 2 drops    Small Pegboard  Pt placing small pegs in pegboard Lt hand with mod to max difficulty and drops, while copying peg design with extra time required d/t [facility] deficits Lt hand.              OT Education - 01/10/18 1311    Education provided  Yes    Education Details  coordination and putty HEP, Lt shoulder HEP    Person(s) Educated  Patient    Methods  Explanation;Demonstration;Handout    Comprehension  Verbalized understanding;Returned demonstration       OT Short Term Goals - 01/10/18 1323      OT SHORT TERM GOAL #1   Title  Independent with HEP  for Lt shoulder, coordination, and grip strength    Time  4    Period  Weeks    Status  On-going      OT SHORT TERM GOAL #2   Title  Pt to verbalize understanding with safety considerations d/t lack of sensation Lt hand    Time  4    Period  Weeks    Status  New      OT SHORT TERM GOAL #3   Title  Pt to report greater ease with typing with task modifications/adaptations prn    Time  4    Period  Weeks    Status  On-going      OT SHORT TERM GOAL #4   Title  Pt to improve coordination Lt hand as evidenced by decreasing 9 hole peg test by 10 sec. or more     Baseline  eval: 95.53 sec    Time  4    Period  Weeks    Status  On-going      OT SHORT TERM GOAL #5   Title  Improve grip strength by 10 lbs Lt hand     Baseline  eval: 51 lbs (Rt = 103 lbs)     Time  4    Period  Weeks    Status  On-going        OT Long Term Goals - 12/25/17 0947      OT LONG TERM GOAL #1   Title  Independent with strengthening HEP for Lt shoulder     Time   8    Period  Weeks    Status  New    Target Date  02/24/18      OT LONG TERM GOAL #2   Title  Pt to perform overhead reaching LUE requiring 115* shoulder flexion to retrieve lightweight object from cabinet    Time  8    Period  Weeks    Status  New      OT LONG TERM GOAL #3   Title  Pt to improve coordination Lt hand as evidenced by decreasing speed on 9 hole peg test by 20 sec. or more    Baseline  eval: 95.53 sec.     Time  8    Period  Weeks    Status  New      OT LONG TERM GOAL #4   Title  Pt to improve grip strength Lt hand by 15 lbs     Baseline  eval: 51 lbs    Time  8    Period  Weeks    Status  New            Plan - 01/11/18 0845    Clinical Impression Statement  Pt with decreased scapulohumeral rhythm and ? frozen shoulder LUE.     Occupational Profile and client history currently impacting functional performance  CVA 03/2017, HTN, DM. Current deficits affecting job especially typing tasks    Occupational performance deficits (Please refer to evaluation for details):  ADL's;IADL's;Work;Leisure;Social Participation    Rehab Potential  Good    Current Impairments/barriers affecting progress:  time since onset    OT Frequency  2x / week    OT Duration  8 weeks    OT Treatment/Interventions  Self-care/ADL training;Moist Heat;Fluidtherapy;DME and/or AE instruction;Therapeutic activities;Therapeutic exercise;Coping strategies training;Neuromuscular education;Functional Mobility Training;Passive range of motion;Patient/family education;Manual Therapy    Plan  joint mobs Lt GH joint, and scapula mobilization in sidelying, typing activities  Consulted and Agree with Plan of Care  Patient       Patient will benefit from skilled therapeutic intervention in order to improve the following deficits and impairments:  Decreased coordination, Decreased range of motion, Impaired flexibility, Decreased endurance, Impaired sensation, Decreased activity tolerance, Decreased  knowledge of precautions, Impaired UE functional use, Pain, Decreased mobility, Decreased strength  Visit Diagnosis: Hemiplegia and hemiparesis following cerebral infarction affecting left non-dominant side (HCC)  Other lack of coordination  Muscle weakness (generalized)  Stiffness of left shoulder, not elsewhere classified    Problem List Patient Active Problem List   Diagnosis Date Noted  . Adjustment disorder with mixed anxiety and depressed mood   . Sleep disturbance   . Hyperlipidemia LDL goal <70   . Hypokalemia   . Hyponatremia   . AKI (acute kidney injury) ([place])   . Diabetes mellitus type 2 in nonobese (HCC)   . Benign essential HTN   . Hemiparesis affecting left side as late effect of cerebrovascular accident (CVA) ([place]) 03/23/2017  . Gait disturbance, post-stroke 03/23/2017  . 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 Left pontine stroke (HCC) - Right paramedian pontine infarct likely secondary to small vessel disease versus symptomatic terminal right vertebral artery stenosis. 03/18/2017    Carey Bullocks, OTR/L 01/11/2018, 8:48 AM  [facility] [address] [place], [place], [address] Phone: [phone]   Fax:  [phone]  Name: Zachary [name] MRN: [id number] Date of Birth: [birth year]

## 2018-01-15 ENCOUNTER — Ambulatory Visit: Payer: 59 | Attending: Nurse Practitioner | Admitting: Occupational Therapy

## 2018-01-15 DIAGNOSIS — M25512 Pain in left shoulder: Secondary | ICD-10-CM | POA: Diagnosis present

## 2018-01-15 DIAGNOSIS — R278 Other lack of coordination: Secondary | ICD-10-CM | POA: Diagnosis present

## 2018-01-15 DIAGNOSIS — M25612 Stiffness of left shoulder, not elsewhere classified: Secondary | ICD-10-CM | POA: Insufficient documentation

## 2018-01-15 DIAGNOSIS — I69354 Hemiplegia and hemiparesis following cerebral infarction affecting left non-dominant side: Secondary | ICD-10-CM | POA: Diagnosis present

## 2018-01-15 DIAGNOSIS — M6281 Muscle weakness (generalized): Secondary | ICD-10-CM | POA: Insufficient documentation

## 2018-01-15 NOTE — Therapy (Signed)
[facility] [address] [place], [place], [address] Phone: [phone]   Fax:  [phone]  Occupational Therapy Treatment  Patient Details  Name: Zachary [name] MRN: [id number] Date of Birth: 04-Mar-1971 Referring Provider: Dr. [name]   Encounter Date: 01/15/2018  OT End of Session - 01/15/18 0844    Visit Number  4    Number of Visits  17    Date for OT Re-Evaluation  02/24/18    Authorization Type  UHC    Authorization - Visit Number  4    Authorization - Number of Visits  23    OT Start Time  0800    OT Stop Time  0845    OT Time Calculation (min)  45 min    Activity Tolerance  Patient tolerated treatment well    Behavior During Therapy  [facility] for tasks assessed/performed       Past Medical History:  Diagnosis Date  . Diabetes mellitus without complication ([place])   . Hypertension   . Stroke [facility]) 05/2017   Right Pontine Stroke    No past surgical history on file.  There were no vitals filed for this visit.  Subjective Assessment - 01/15/18 0802    Subjective   It feels a little looser (re: Lt shoulder)    Pertinent History  CVA 03/2017, HTN, DM    Limitations  fall risk     Patient Stated Goals  Improve ROM and strength Lt shoulder, and get my Lt hand better at typing    Currently in Pain?  No/denies                   OT Treatments/Exercises (OP) - 01/15/18 0001      ADLs   ADL Comments  Typing test: 20 wpm, 90% accuracy using Lt ring finger only (when using Lt hand). Typing game: "bubbles" x 2 w/ focus on using correct fingers on Lt hand for each letter.       Manual Therapy   Manual Therapy  Joint mobilization;Passive ROM    Joint Mobilization  Joint mobs to [facility] joint (posterior, inferior, and anterior capsule) as able in supine. Followed by scapula mobilization w/ AA/ROM in sh flex/ext in sidelying    Passive ROM  After joint mobs to [facility] joint and scapula, performed passive ROM in  sh. flexion and abduction in supine. Pt shown self stretch in sh. flexion and instructed to also do at home      Fine Motor Coordination (Hand/Wrist)   In Hand Manipulation Training  Translating stress balls in Lt hand with mod difficulty, 1 drop. Followed by rotating ball in fingertips with max difficulty and dropping into palm.                OT Short Term Goals - 01/10/18 1323      OT SHORT TERM GOAL #1   Title  Independent with HEP for Lt shoulder, coordination, and grip strength    Time  4    Period  Weeks    Status  On-going      OT SHORT TERM GOAL #2   Title  Pt to verbalize understanding with safety considerations d/t lack of sensation Lt hand    Time  4    Period  Weeks    Status  New      OT SHORT TERM GOAL #3   Title  Pt to report greater ease with typing with task modifications/adaptations prn  Time  4    Period  Weeks    Status  On-going      OT SHORT TERM GOAL #4   Title  Pt to improve coordination Lt hand as evidenced by decreasing 9 hole peg test by 10 sec. or more     Baseline  eval: 95.53 sec    Time  4    Period  Weeks    Status  On-going      OT SHORT TERM GOAL #5   Title  Improve grip strength by 10 lbs Lt hand     Baseline  eval: 51 lbs (Rt = 103 lbs)     Time  4    Period  Weeks    Status  On-going        OT Long Term Goals - 12/25/17 3086      OT LONG TERM GOAL #1   Title  Independent with strengthening HEP for Lt shoulder     Time  8    Period  Weeks    Status  New    Target Date  02/24/18      OT LONG TERM GOAL #2   Title  Pt to perform overhead reaching LUE requiring 115* shoulder flexion to retrieve lightweight object from cabinet    Time  8    Period  Weeks    Status  New      OT LONG TERM GOAL #3   Title  Pt to improve coordination Lt hand as evidenced by decreasing speed on 9 hole peg test by 20 sec. or more    Baseline  eval: 95.53 sec.     Time  8    Period  Weeks    Status  New      OT LONG TERM GOAL #4    Title  Pt to improve grip strength Lt hand by 15 lbs     Baseline  eval: 51 lbs    Time  8    Period  Weeks    Status  New            Plan - 01/15/18 0845    Clinical Impression Statement  Pt with increased shoulder ROM after joint mobs to Lt [place] joint and scapula.     Occupational Profile and client history currently impacting functional performance  CVA 03/2017, HTN, DM. Current deficits affecting job especially typing tasks    Occupational performance deficits (Please refer to evaluation for details):  ADL's;IADL's;Work;Leisure;Social Participation    Rehab Potential  Good    Current Impairments/barriers affecting progress:  time since onset    OT Frequency  2x / week    OT Duration  8 weeks    OT Treatment/Interventions  Self-care/ADL training;Moist Heat;Fluidtherapy;DME and/or AE instruction;Therapeutic activities;Therapeutic exercise;Coping strategies training;Neuromuscular education;Functional Mobility Training;Passive range of motion;Patient/family education;Manual Therapy    Plan  continue joint mobs Lt GH joint, and scapula mobilization in sidelying, ROM Lt shoulder, continue [facility]    Consulted and Agree with Plan of Care  Patient       Patient will benefit from skilled therapeutic intervention in order to improve the following deficits and impairments:  Decreased coordination, Decreased range of motion, Impaired flexibility, Decreased endurance, Impaired sensation, Decreased activity tolerance, Decreased knowledge of precautions, Impaired UE functional use, Pain, Decreased mobility, Decreased strength  Visit Diagnosis: Hemiplegia and hemiparesis following cerebral infarction affecting left non-dominant side (HCC)  Stiffness of left shoulder, not elsewhere classified  Left shoulder pain, unspecified chronicity  Other lack of coordination    Problem List Patient Active Problem List   Diagnosis Date Noted  . Adjustment disorder with mixed anxiety and depressed mood   .  Sleep disturbance   . Hyperlipidemia LDL goal <70   . Hypokalemia   . Hyponatremia   . AKI (acute kidney injury) ([place])   . Diabetes mellitus type 2 in nonobese (HCC)   . Benign essential HTN   . Hemiparesis affecting left side as late effect of cerebrovascular accident (CVA) ([place]) 03/23/2017  . Gait disturbance, post-stroke 03/23/2017  . 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 Left pontine stroke (HCC) - Right paramedian pontine infarct likely secondary to small vessel disease versus symptomatic terminal right vertebral artery stenosis. 03/18/2017    Carey Bullocks, OTR/L 01/15/2018, 8:46 AM  [facility] [address] [place], [place], [address] Phone: [phone]   Fax:  [phone]  Name: Zachary [name] MRN: [id number] Date of Birth: 09/06/1970

## 2018-01-18 ENCOUNTER — Ambulatory Visit: Payer: 59 | Admitting: Occupational Therapy

## 2018-01-22 ENCOUNTER — Ambulatory Visit: Payer: 59 | Admitting: *Deleted

## 2018-01-22 ENCOUNTER — Encounter: Payer: Self-pay | Admitting: *Deleted

## 2018-01-22 DIAGNOSIS — M25612 Stiffness of left shoulder, not elsewhere classified: Secondary | ICD-10-CM

## 2018-01-22 DIAGNOSIS — R278 Other lack of coordination: Secondary | ICD-10-CM

## 2018-01-22 DIAGNOSIS — I69354 Hemiplegia and hemiparesis following cerebral infarction affecting left non-dominant side: Secondary | ICD-10-CM | POA: Diagnosis not present

## 2018-01-22 DIAGNOSIS — M25512 Pain in left shoulder: Secondary | ICD-10-CM

## 2018-01-22 DIAGNOSIS — M6281 Muscle weakness (generalized): Secondary | ICD-10-CM

## 2018-01-22 NOTE — Therapy (Signed)
[facility] [address] [place], [place], [address] Phone: [phone]   Fax:  [phone]  Occupational Therapy Treatment  Patient Details  Name: Zachary [name] MRN: [id number] Date of Birth: 1971-05-13 Referring Provider: Dr. [name]   Encounter Date: 01/22/2018  OT End of Session - 01/22/18 0847    Visit Number  5    Number of Visits  17    Date for OT Re-Evaluation  02/24/18    Authorization Type  [employer]    Authorization - Visit Number  5    Authorization - Number of Visits  23    OT Start Time  6962    OT Stop Time  0842    OT Time Calculation (min)  47 min    Activity Tolerance  Patient tolerated treatment well    Behavior During Therapy  [facility] for tasks assessed/performed       Past Medical History:  Diagnosis Date  . Diabetes mellitus without complication ([place])   . Hypertension   . Stroke [facility]) 05/2017   Right Pontine Stroke    History reviewed. No pertinent surgical history.  There were no vitals filed for this visit.  Subjective Assessment - 01/22/18 0756    Subjective   Pt reports left shoulder stiffness, denies pain.    Pertinent History  CVA 03/2017, HTN, DM    Limitations  fall risk     Patient Stated Goals  Improve ROM and strength Lt shoulder, and get my Lt hand better at typing    Currently in Pain?  No/denies                   OT Treatments/Exercises (OP) - 01/22/18 0001      Exercises   Exercises  Hand;Shoulder      Shoulder Exercises: Therapy Ball   Other Therapy Ball Exercises  Standing at wall w/ ball for shoulder flexion x5 reps followed by shoulder flexion in standing at wall x10 reps w/ Min vc's for positioning and follow through.      Shoulder Exercises: ROM/Strengthening   UBE (Upper Arm Bike)  UBE x 6 min. for reciprocal movement pattern      Manual Therapy   Manual Therapy  Joint mobilization;Passive ROM    Joint Mobilization  Joint mobs to [facility] joint  (posterior, inferior, and anterior capsule) as able in supine. Followed by scapula mobilization w/ AA/ROM in sh flex/ext in sidelying      Fine Motor Coordination (Hand/Wrist)   Fine Motor Coordination  Flipping cards;Picking up coins;Stacking coins;In hand manipuation training    In Hand Manipulation Training  Rotating ball left hand and changing direction; picking up coins 4 at a time and placing one at a time into container. w/ mod difficulty noted - frequently dropping coins but able to pick back up w/o dropping others in his hand noted.    Picking up coins  Left hand     Stacking coins  Left hand and stacking in piles of 4              OT Education - 01/22/18 0846    Education provided  Yes    Education Details  Positioning during FM/Coord ex's; cont w/ HEP as previously issued LUE    Person(s) Educated  Patient    Methods  Explanation;Demonstration    Comprehension  Verbalized understanding;Returned demonstration       OT Short Term Goals - 01/10/18 1323  OT SHORT TERM GOAL #1   Title  Independent with HEP for Lt shoulder, coordination, and grip strength    Time  4    Period  Weeks    Status  On-going      OT SHORT TERM GOAL #2   Title  Pt to verbalize understanding with safety considerations d/t lack of sensation Lt hand    Time  4    Period  Weeks    Status  New      OT SHORT TERM GOAL #3   Title  Pt to report greater ease with typing with task modifications/adaptations prn    Time  4    Period  Weeks    Status  On-going      OT SHORT TERM GOAL #4   Title  Pt to improve coordination Lt hand as evidenced by decreasing 9 hole peg test by 10 sec. or more     Baseline  eval: 95.53 sec    Time  4    Period  Weeks    Status  On-going      OT SHORT TERM GOAL #5   Title  Improve grip strength by 10 lbs Lt hand     Baseline  eval: 51 lbs (Rt = 103 lbs)     Time  4    Period  Weeks    Status  On-going        OT Long Term Goals - 12/25/17 6962      OT  LONG TERM GOAL #1   Title  Independent with strengthening HEP for Lt shoulder     Time  8    Period  Weeks    Status  New    Target Date  02/24/18      OT LONG TERM GOAL #2   Title  Pt to perform overhead reaching LUE requiring 115* shoulder flexion to retrieve lightweight object from cabinet    Time  8    Period  Weeks    Status  New      OT LONG TERM GOAL #3   Title  Pt to improve coordination Lt hand as evidenced by decreasing speed on 9 hole peg test by 20 sec. or more    Baseline  eval: 95.53 sec.     Time  8    Period  Weeks    Status  New      OT LONG TERM GOAL #4   Title  Pt to improve grip strength Lt hand by 15 lbs     Baseline  eval: 51 lbs    Time  8    Period  Weeks    Status  New            Plan - 01/22/18 0847    Clinical Impression Statement  Pt reported increased shoulder ROM s/p treatment session today w/ focus on L GH joint and scapula ROM/Mobs/Ex's    Occupational Profile and client history currently impacting functional performance  CVA 03/2017, HTN, DM. Current deficits affecting job especially typing tasks    Occupational performance deficits (Please refer to evaluation for details):  ADL's;IADL's;Work;Leisure;Social Participation    Rehab Potential  Good    Current Impairments/barriers affecting progress:  time since onset    OT Frequency  2x / week    OT Duration  8 weeks    OT Treatment/Interventions  Self-care/ADL training;Moist Heat;Fluidtherapy;DME and/or AE instruction;Therapeutic activities;Therapeutic exercise;Coping strategies training;Neuromuscular education;Functional Mobility Training;Passive range of motion;Patient/family education;Manual Therapy  Plan  Check STG's for grip/strength using JAMAR and 9 Hole Peg Test; Cont joint mob's L GH joint/scapula mob and [address].    Clinical Decision Making  Several treatment options, min-mod task modification necessary    Consulted and Agree with Plan of Care  Patient       Patient will benefit  from skilled therapeutic intervention in order to improve the following deficits and impairments:  Decreased coordination, Decreased range of motion, Impaired flexibility, Decreased endurance, Impaired sensation, Decreased activity tolerance, Decreased knowledge of precautions, Impaired UE functional use, Pain, Decreased mobility, Decreased strength  Visit Diagnosis: Hemiplegia and hemiparesis following cerebral infarction affecting left non-dominant side (HCC)  Left shoulder pain, unspecified chronicity  Stiffness of left shoulder, not elsewhere classified  Other lack of coordination  Muscle weakness (generalized)    Problem List Patient Active Problem List   Diagnosis Date Noted  . Adjustment disorder with mixed anxiety and depressed mood   . Sleep disturbance   . Hyperlipidemia LDL goal <70   . Hypokalemia   . Hyponatremia   . AKI (acute kidney injury) ([place])   . Diabetes mellitus type 2 in nonobese (HCC)   . Benign essential HTN   . Hemiparesis affecting left side as late effect of cerebrovascular accident (CVA) ([place]) 03/23/2017  . Gait disturbance, post-stroke 03/23/2017  . 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 Left pontine stroke (HCC) - Right paramedian pontine infarct likely secondary to small vessel disease versus symptomatic terminal right vertebral artery stenosis. 03/18/2017    Almyra Deforest, OTR/L 01/22/2018, 8:52 AM  [facility] [address] [place], [place], [address] Phone: [phone]   Fax:  [phone]  Name: Zachary [name] MRN: [id number] Date of Birth: May 22, 1971

## 2018-01-24 ENCOUNTER — Ambulatory Visit: Payer: 59 | Admitting: Occupational Therapy

## 2018-02-19 ENCOUNTER — Ambulatory Visit: Payer: 59 | Admitting: Nurse Practitioner

## 2018-05-16 ENCOUNTER — Other Ambulatory Visit: Payer: Self-pay

## 2018-05-16 DIAGNOSIS — E119 Type 2 diabetes mellitus without complications: Secondary | ICD-10-CM

## 2018-05-16 MED ORDER — METFORMIN HCL 500 MG PO TABS: 500.0000 mg | ORAL_TABLET | Freq: Two times a day (BID) | ORAL | 0 refills | Status: DC

## 2018-06-01 ENCOUNTER — Other Ambulatory Visit: Payer: Self-pay

## 2018-06-01 DIAGNOSIS — I10 Essential (primary) hypertension: Secondary | ICD-10-CM

## 2018-06-01 MED ORDER — AMLODIPINE BESYLATE 10 MG PO TABS: 10.0000 mg | ORAL_TABLET | Freq: Every day | ORAL | 0 refills | Status: DC

## 2018-06-21 ENCOUNTER — Other Ambulatory Visit: Payer: Self-pay | Admitting: Nurse Practitioner

## 2018-06-21 DIAGNOSIS — I10 Essential (primary) hypertension: Secondary | ICD-10-CM

## 2018-06-22 MED ORDER — ATENOLOL 25 MG PO TABS: 25.0000 mg | ORAL_TABLET | Freq: Every day | ORAL | 0 refills | Status: DC

## 2018-07-16 ENCOUNTER — Other Ambulatory Visit: Payer: Self-pay | Admitting: Family Medicine

## 2018-07-16 DIAGNOSIS — I10 Essential (primary) hypertension: Secondary | ICD-10-CM

## 2018-07-23 ENCOUNTER — Telehealth: Payer: Self-pay | Admitting: Nurse Practitioner

## 2018-07-23 DIAGNOSIS — I10 Essential (primary) hypertension: Secondary | ICD-10-CM

## 2018-07-23 DIAGNOSIS — I6930 Unspecified sequelae of cerebral infarction: Secondary | ICD-10-CM

## 2018-07-23 DIAGNOSIS — E119 Type 2 diabetes mellitus without complications: Secondary | ICD-10-CM

## 2018-07-23 NOTE — Telephone Encounter (Signed)
1) Medication(s) Requested (by name): Atenolol Amlodipine Metformin Atorvastatin clopidogrel 2) Pharmacy [facility]:  [facility] [address]

## 2018-07-24 NOTE — Telephone Encounter (Signed)
Pt should ask for refills at his appt tomorrow 07/25/18.

## 2018-07-25 ENCOUNTER — Ambulatory Visit: Payer: 59 | Attending: Nurse Practitioner | Admitting: Physician Assistant

## 2018-07-25 VITALS — BP 144/87 | HR 88 | Temp 98.9°F | Resp 16 | Wt 226.4 lb

## 2018-07-25 DIAGNOSIS — E119 Type 2 diabetes mellitus without complications: Secondary | ICD-10-CM | POA: Diagnosis not present

## 2018-07-25 DIAGNOSIS — I6930 Unspecified sequelae of cerebral infarction: Secondary | ICD-10-CM

## 2018-07-25 DIAGNOSIS — Z8673 Personal history of transient ischemic attack (TIA), and cerebral infarction without residual deficits: Secondary | ICD-10-CM | POA: Insufficient documentation

## 2018-07-25 DIAGNOSIS — Z7902 Long term (current) use of antithrombotics/antiplatelets: Secondary | ICD-10-CM | POA: Insufficient documentation

## 2018-07-25 DIAGNOSIS — I10 Essential (primary) hypertension: Secondary | ICD-10-CM | POA: Diagnosis not present

## 2018-07-25 DIAGNOSIS — Z7982 Long term (current) use of aspirin: Secondary | ICD-10-CM | POA: Insufficient documentation

## 2018-07-25 DIAGNOSIS — Z79899 Other long term (current) drug therapy: Secondary | ICD-10-CM | POA: Insufficient documentation

## 2018-07-25 DIAGNOSIS — Z7984 Long term (current) use of oral hypoglycemic drugs: Secondary | ICD-10-CM | POA: Insufficient documentation

## 2018-07-25 LAB — POCT GLYCOSYLATED HEMOGLOBIN (HGB A1C): HbA1c, POC (controlled diabetic range): 7.3 % — AB (ref 0.0–7.0)

## 2018-07-25 LAB — GLUCOSE, POCT (MANUAL RESULT ENTRY): POC Glucose: 175 mg/dl — AB (ref 70–99)

## 2018-07-25 MED ORDER — CLOPIDOGREL BISULFATE 75 MG PO TABS: 75.0000 mg | ORAL_TABLET | Freq: Every day | ORAL | 1 refills | Status: DC

## 2018-07-25 MED ORDER — LISINOPRIL-HYDROCHLOROTHIAZIDE 20-25 MG PO TABS: 1.0000 | ORAL_TABLET | Freq: Every day | ORAL | 3 refills | Status: DC

## 2018-07-25 MED ORDER — AMLODIPINE BESYLATE 10 MG PO TABS: 10.0000 mg | ORAL_TABLET | Freq: Every day | ORAL | 1 refills | Status: DC

## 2018-07-25 MED ORDER — METFORMIN HCL 500 MG PO TABS: ORAL_TABLET | ORAL | 1 refills | Status: DC

## 2018-07-25 MED ORDER — ATORVASTATIN CALCIUM 20 MG PO TABS: 20.0000 mg | ORAL_TABLET | Freq: Every day | ORAL | 1 refills | Status: DC

## 2018-07-25 MED ORDER — ATENOLOL 25 MG PO TABS: 25.0000 mg | ORAL_TABLET | Freq: Every day | ORAL | 1 refills | Status: DC

## 2018-07-25 NOTE — Patient Instructions (Signed)
Drink more water.  Limit/reduce sweets, drinks, sugars in all forms

## 2018-07-25 NOTE — Progress Notes (Signed)
Patient ID: Zachary [name], male   DOB: 04-Mar-1971, 47 y.o.   MRN: [id number]   Zachary [name], is a 47 y.o. male  PYP:[id number]  IWP:[id number]  DOB - Dec 21, 1970  Subjective:  Chief Complaint and HPI: Zachary [name] is a 47 y.o. male here today for med Refills.  For the last month he's only been taking metformin once a day bc he was running out.  He has been out of all meds now for about 4 days.  No CP/SOB/Dizziness.  No s/sx hyper/hypoglycemia.  Not checking sugars regularly.    ROS:   Constitutional:  No f/c, No night sweats, No unexplained weight loss. EENT:  No vision changes, No blurry vision, No hearing changes. No mouth, throat, or ear problems.  Respiratory: No cough, No SOB Cardiac: No CP, no palpitations GI:  No abd pain, No N/V/D. GU: No Urinary s/sx Musculoskeletal: No joint pain Neuro: No headache, no dizziness, no motor weakness.  Skin: No rash Endocrine:  No polydipsia. No polyuria.  Psych: Denies SI/HI  No problems updated.  ALLERGIES: No Known Allergies  PAST MEDICAL HISTORY: Past Medical History:  Diagnosis Date  . Diabetes mellitus without complication ([place])   . Hypertension   . Stroke [facility]) 05/2017   Right Pontine Stroke    MEDICATIONS AT HOME: Prior to Admission medications   Medication Sig Start Date End Date Taking? Authorizing Provider  amLODipine (NORVASC) 10 MG tablet Take 1 tablet (10 mg total) by mouth daily. 07/25/18   Argentina Donovan, PA-C  aspirin 81 MG EC tablet Take 1 tablet (81 mg total) by mouth at bedtime. 10/31/17   Gildardo Pounds, NP  atenolol (TENORMIN) 25 MG tablet Take 1 tablet (25 mg total) by mouth daily. 07/25/18 10/23/18  Argentina Donovan, PA-C  atorvastatin (LIPITOR) 20 MG tablet Take 1 tablet (20 mg total) by mouth daily at 6 PM. 07/25/18   Thereasa Solo, Dionne Bucy, PA-C  BD ULTRA-FINE LANCETS lancets Use as instructed 04/14/17   Brayton Caves, PA-C  blood glucose meter kit and supplies KIT Dispense based on patient and insurance  preference. Use up to four times daily as directed. (FOR ICD-9 250.00, 250.01). 04/12/17   Meredith Staggers, MD  Blood Glucose Monitoring Suppl (TRUE METRIX METER) w/Device KIT 1 each by Does not apply route 3 (three) times daily. 04/14/17   Brayton Caves, PA-C  clopidogrel (PLAVIX) 75 MG tablet Take 1 tablet (75 mg total) by mouth daily. 07/25/18   Argentina Donovan, PA-C  glucose blood (TRUE METRIX BLOOD GLUCOSE TEST) test strip Use as instructed 04/14/17   Brayton Caves, PA-C  lisinopril-hydrochlorothiazide (PRINZIDE,ZESTORETIC) 20-25 MG tablet Take 1 tablet by mouth daily. 07/25/18   Argentina Donovan, PA-C  metFORMIN (GLUCOPHAGE) 500 MG tablet Take 2 with breakfast and 1 with dinner 07/25/18   Freeman Caldron M, PA-C  potassium chloride SA (K-DUR,KLOR-CON) 20 MEQ tablet Take 1 tablet (20 mEq total) by mouth daily. Patient not taking: Reported on 10/31/2017 05/05/17   Brayton Caves, PA-C     Objective:  EXAM:   Vitals:   07/25/18 1502  BP: (!) 144/87  Pulse: 88  Resp: 16  Temp: 98.9 F (37.2 C)  TempSrc: Oral  SpO2: 96%  Weight: 226 lb 6.4 oz (102.7 kg)    General appearance : A&OX3. NAD. Non-toxic-appearing HEENT: Atraumatic and Normocephalic.  PERRLA. EOM intact.  TM clear B. Neck: supple, no JVD. No cervical lymphadenopathy. No thyromegaly Chest/Lungs:  Breathing-non-labored, Good air  entry bilaterally, breath sounds normal without rales, rhonchi, or wheezing  CVS: S1 S2 regular, no murmurs, gallops, rubs  Extremities: Bilateral Lower Ext shows no edema, both legs are warm to touch with = pulse throughout Neurology:  CN II-XII grossly intact, Non focal.   Psych:  TP linear. J/I WNL. Normal speech. Appropriate eye contact and affect.  Skin:  No Rash  Data Review Lab Results  Component Value Date   HGBA1C 7.0 10/31/2017   HGBA1C 7.0 05/11/2017   HGBA1C 8.1 (H) 03/19/2017     Assessment & Plan   1. Diabetes mellitus type 2 in nonobese (HCC) Uncontrolled.   (A1C=7.3 today) - Glucose (CBG) - HgB A1c - metFORMIN (GLUCOPHAGE) 500 MG tablet; Take 2 with breakfast and 1 with dinner  Dispense: 270 tablet; Refill: 1  2. Essential hypertension Uncontrolled but out of meds X 4 days.  Resume meds - amLODipine (NORVASC) 10 MG tablet; Take 1 tablet (10 mg total) by mouth daily.  Dispense: 90 tablet; Refill: 1 - atenolol (TENORMIN) 25 MG tablet; Take 1 tablet (25 mg total) by mouth daily.  Dispense: 90 tablet; Refill: 1 - lisinopril-hydrochlorothiazide (PRINZIDE,ZESTORETIC) 20-25 MG tablet; Take 1 tablet by mouth daily.  Dispense: 90 tablet; Refill: 3  3. History of CVA with residual deficit - atorvastatin (LIPITOR) 20 MG tablet; Take 1 tablet (20 mg total) by mouth daily at 6 PM.  Dispense: 90 tablet; Refill: 1 - clopidogrel (PLAVIX) 75 MG tablet; Take 1 tablet (75 mg total) by mouth daily.  Dispense: 90 tablet; Refill: 1  Patient have been counseled extensively about nutrition and exercise  Return in about 2 months (around 09/25/2018) for Zelda recheck BP and DM.  The patient was given clear instructions to go to ER or return to medical center if symptoms don't improve, worsen or new problems develop. The patient verbalized understanding. The patient was told to call to get lab results if they haven't heard anything in the next week.     Freeman Caldron, PA-C [facility] and [place], [place]   07/25/2018, 3:13 PM

## 2018-09-24 ENCOUNTER — Encounter: Payer: Self-pay | Admitting: Occupational Therapy

## 2018-09-24 NOTE — Therapy (Unsigned)
[facility] [address] [place], [place], [address] Phone: [phone]   Fax:  [phone]  Patient Details  Name: Zachary [name] MRN: [id number] Date of Birth: 31-Aug-1970 Referring Provider:  No ref. provider found  Pt attended 5 out of 17 visits and did not return for follow up appointments therefore is d/c from out-pt neuro rehab/OT.  Percell Miller Beth Dixon, OTR/L 09/24/2018, 11:23 AM  [facility] [address] [place], [place], [address] Phone: [phone]   Fax:  [phone]

## 2018-11-12 IMAGING — CT CT ANGIO HEAD
3 of 7 series · 10 of 36 positions shown · IV contrast (APPLIED)
Comparison: Head CT without contrast 3660 hours today.

CLINICAL DATA: 46-year-old male with dizziness, headache and left
side numbness. Last seen normal 9557 hours.

EXAM:
CT ANGIOGRAPHY HEAD AND NECK
TECHNIQUE: Multidetector CT imaging of the head and neck was performed using
the standard protocol during bolus administration of intravenous
contrast. Multiplanar CT image reconstructions and MIPs were
obtained to evaluate the vascular anatomy. Carotid stenosis
measurements (when applicable) are obtained utilizing NASCET
criteria, using the distal internal carotid diameter as the
denominator.
CONTRAST:  50 mL Isovue 370.

[Series 5: cta neck/head · axial · 0.60mm/px · z∈[-187,-67]mm · 2 of 181 slices shown]
[im 61/181  soft-tissue]
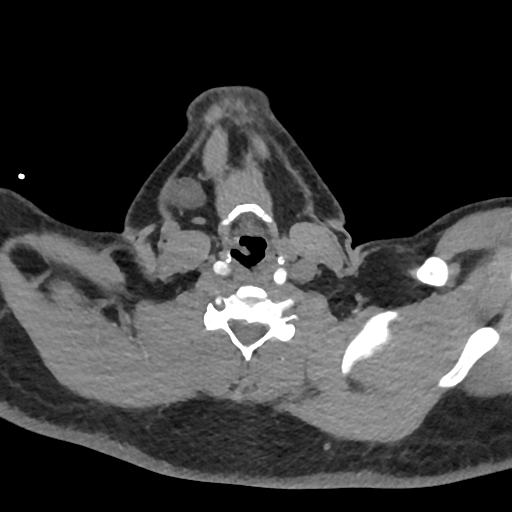
[im 121/181  soft-tissue]
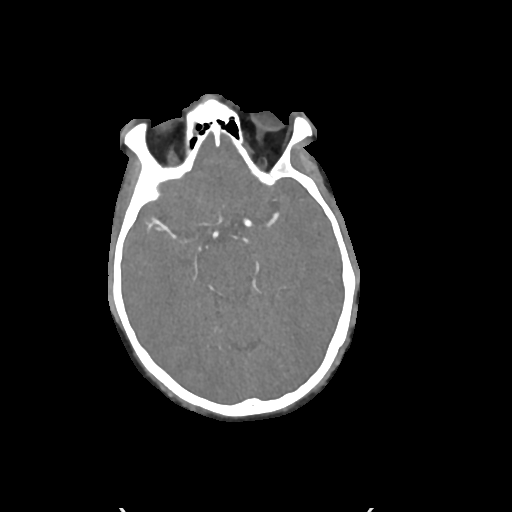

[Series 7: ax thins · axial · 0.49mm/px · z∈[-256,+2]mm · 6 of 361 slices shown]
[im 52/361  soft-tissue]
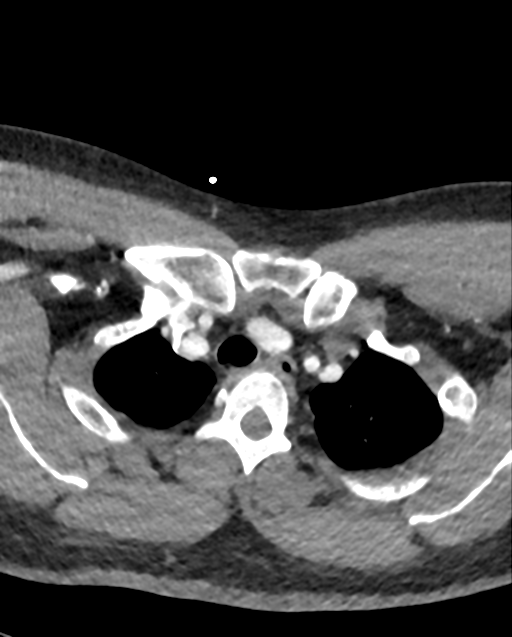
[im 103/361  bone]
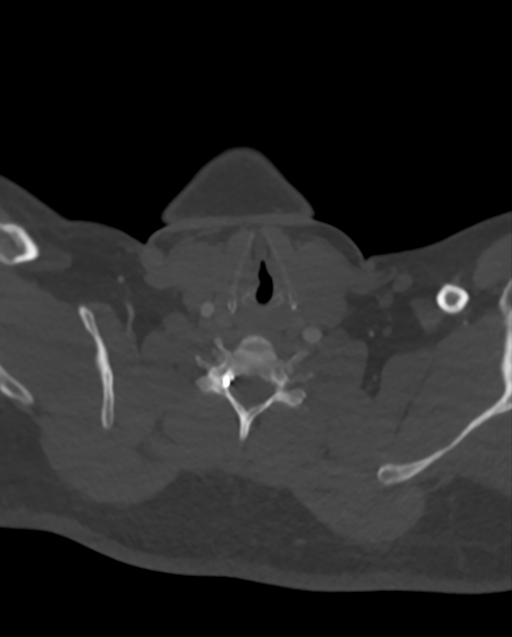
[im 155/361  soft-tissue]
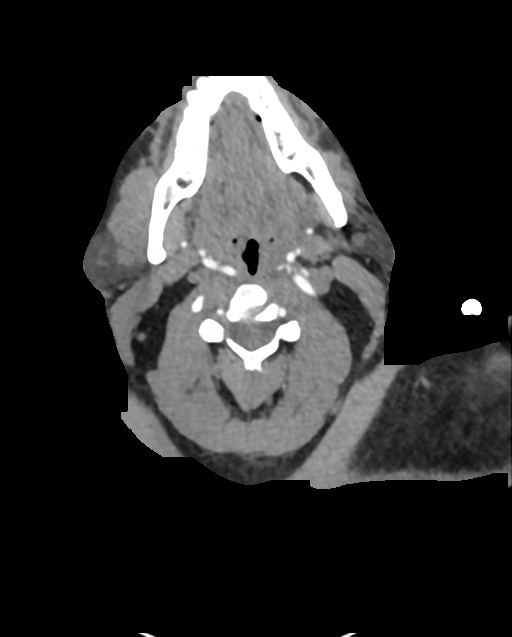
[im 206/361  bone]
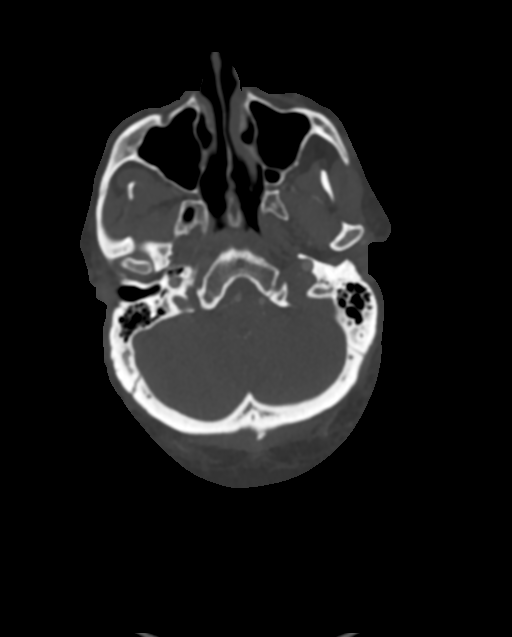
[im 258/361  soft-tissue]
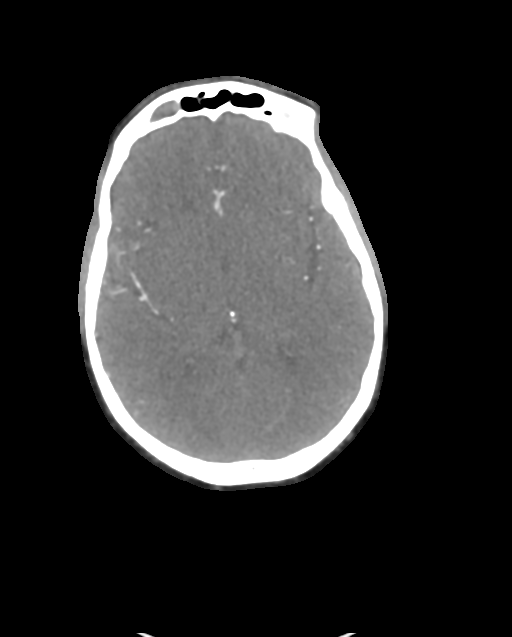
[im 309/361  bone]
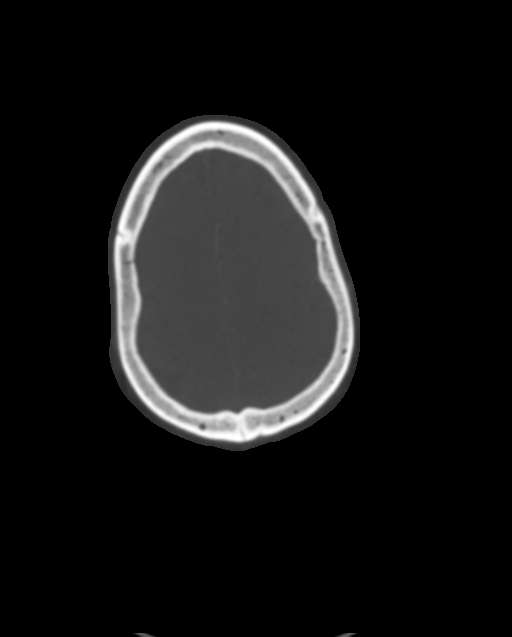

[Series 9: sag thins · sagittal · 0.63mm/px · 2 of 201 slices shown]
[im 36/201  soft-tissue]
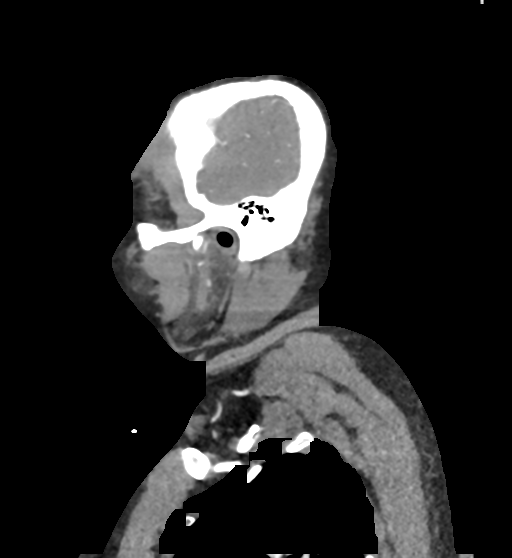
[im 166/201  soft-tissue]
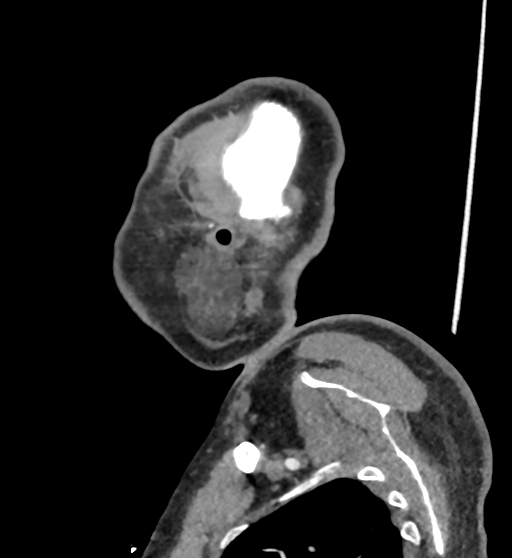

[10 of 36 positions shown; findings below may reference images not displayed]

FINDINGS: CTA NECK

Skeleton: Straightening of cervical lordosis. No acute osseous
abnormality identified.

Upper chest: Negative lung apices. Negative visualized superior
mediastinum.

Other neck: Asymmetry of the larynx suggesting the possibility of
right vocal cord paralysis. Pharyngeal soft tissue contours are
within normal limits. Partially retropharyngeal course of the right
carotid. Negative parapharyngeal and retropharyngeal spaces
otherwise. Negative sublingual space, submandibular and parotid
glands. No cervical lymphadenopathy. Leftward gaze deviation
persists.

Aortic arch: Bovine type arch configuration. Tortuous proximal great
vessels. No arch or great vessel origin atherosclerosis.

Right carotid system: Negative aside from a partially
retropharyngeal course of the right ICA.

Left carotid system: Negative aside from mild tortuosity.

Vertebral arteries:Mildly tortuous proximal subclavian arteries
without stenosis.

Normal vertebral artery origins. Both vertebral arteries appear
normal to the skullbase, the left is mildly dominant.

CTA HEAD

Posterior circulation: Mild left V4 segment calcified plaque distal
to the patent PICA origin without significant stenosis. More
moderate right V4 segment calcified plaque proximal to the right
PICA origin. Up to moderate stenosis. Mild to moderate additional
irregularity of the distal right V4 segment.

Patent vertebrobasilar junction. Mild basilar artery irregularity
without stenosis. Patent SCA and PCA origins, but severe stenosis at
the left PCA origin (series 11, image 31). Posterior communicating
arteries are diminutive or absent. Right PCA branches are within
normal limits. The more distal left PCA branches are mildly
irregular.

Anterior circulation: Both ICA siphons are patent. Focal
supraclinoid calcified plaque greater on the left, no significant
stenosis. Patent carotid termini. Normal MCA and ACA origins. Left
A1 segment is dominant and the right is diminutive. Anterior
communicating artery and bilateral ACA branches are within normal
limits. Right MCA M1 segment, trifurcation, and right MCA branches
are within normal limits. Left MCA M1 segment, bifurcation, and left
MCA branches are within normal limits.

Venous sinuses: No venous phase contrast on these images.

Anatomic variants: Bovine type arch configuration. Mildly dominant
left vertebral artery. Dominant left and diminutive right ACA A1
segments.

Review of the MIP images confirms the above findings
IMPRESSION: 1. Negative for emergent large vessel occlusion.
2. Positive for moderate to severe stenosis of the distal Right
vertebral artery, and Left PCA origin. Basilar artery irregularity
also suggesting atherosclerosis, without significant stenosis.
3. Comparatively minimal atherosclerosis of the carotid arteries and
anterior circulation.

## 2019-01-20 ENCOUNTER — Other Ambulatory Visit: Payer: Self-pay | Admitting: Physician Assistant

## 2019-01-20 DIAGNOSIS — I10 Essential (primary) hypertension: Secondary | ICD-10-CM

## 2019-01-20 DIAGNOSIS — E119 Type 2 diabetes mellitus without complications: Secondary | ICD-10-CM

## 2019-03-12 ENCOUNTER — Telehealth: Payer: Self-pay | Admitting: Nurse Practitioner

## 2019-03-12 ENCOUNTER — Other Ambulatory Visit: Payer: Self-pay | Admitting: Physician Assistant

## 2019-03-12 DIAGNOSIS — I10 Essential (primary) hypertension: Secondary | ICD-10-CM

## 2019-03-12 NOTE — Telephone Encounter (Signed)
1) Medication(s) Requested (by name): amLODipine (NORVASC) 10 MG tablet [[id number]]  atenolol (TENORMIN) 25 MG tablet [[id number]] ENDED clopidogrel (PLAVIX) 75 MG tablet [[id number]]     2) Pharmacy [facility]: [id number] [place], [place] - [address]    3) Special Requests:. Has appt 07/30   Approved medications will be sent to the pharmacy, we will reach out if there is an issue.  Requests made after 3pm may not be addressed until the following business day!  If a patient is unsure of the name of the medication(s) please note and ask patient to call back when they are able to provide all info, do not send to responsible party until all information is available!

## 2019-03-13 NOTE — Telephone Encounter (Signed)
This will need to be addressed during patient visit scheduled for 03/14/19

## 2019-03-14 ENCOUNTER — Other Ambulatory Visit: Payer: Self-pay

## 2019-03-14 ENCOUNTER — Ambulatory Visit: Payer: 59 | Attending: Nurse Practitioner | Admitting: Physician Assistant

## 2019-03-14 DIAGNOSIS — E119 Type 2 diabetes mellitus without complications: Secondary | ICD-10-CM | POA: Diagnosis not present

## 2019-03-14 DIAGNOSIS — I6930 Unspecified sequelae of cerebral infarction: Secondary | ICD-10-CM

## 2019-03-14 DIAGNOSIS — Z760 Encounter for issue of repeat prescription: Secondary | ICD-10-CM

## 2019-03-14 DIAGNOSIS — I10 Essential (primary) hypertension: Secondary | ICD-10-CM

## 2019-03-14 DIAGNOSIS — E785 Hyperlipidemia, unspecified: Secondary | ICD-10-CM

## 2019-03-14 MED ORDER — CLOPIDOGREL BISULFATE 75 MG PO TABS: 75.0000 mg | ORAL_TABLET | Freq: Every day | ORAL | 1 refills | Status: DC

## 2019-03-14 MED ORDER — ATORVASTATIN CALCIUM 20 MG PO TABS: 20.0000 mg | ORAL_TABLET | Freq: Every day | ORAL | 1 refills | Status: DC

## 2019-03-14 MED ORDER — AMLODIPINE BESYLATE 10 MG PO TABS: 10.0000 mg | ORAL_TABLET | Freq: Every day | ORAL | 1 refills | Status: DC

## 2019-03-14 MED ORDER — METFORMIN HCL 500 MG PO TABS: ORAL_TABLET | ORAL | 1 refills | Status: DC

## 2019-03-14 MED ORDER — LISINOPRIL-HYDROCHLOROTHIAZIDE 20-25 MG PO TABS: 1.0000 | ORAL_TABLET | Freq: Every day | ORAL | 3 refills | Status: DC

## 2019-03-14 MED ORDER — ASPIRIN 81 MG PO TBEC: 81.0000 mg | DELAYED_RELEASE_TABLET | Freq: Every day | ORAL | 1 refills | Status: DC

## 2019-03-14 MED ORDER — ATENOLOL 25 MG PO TABS: 25.0000 mg | ORAL_TABLET | Freq: Every day | ORAL | 1 refills | Status: DC

## 2019-03-14 NOTE — Progress Notes (Signed)
Virtual Visit via Telephone Note  I connected with Zachary [name] on 03/14/19 at  3:50 PM EDT by telephone and verified that I am speaking with the correct person using two identifiers.   I discussed the limitations, risks, security and privacy concerns of performing an evaluation and management service by telephone and the availability of in person appointments. I also discussed with the patient that there may be a patient responsible charge related to this service. The patient expressed understanding and agreed to proceed.  Patient location:  home My Location:  [place] office Persons on the call: me and the patient  History of Present Illness: patient calls needs med RF.  He denies any concerns or complaints.  No CP/SOB/edema.  His BP has been running ~120-130/80-85.  He doesn't check his blood sugars frequently-last checked about 2 weeks ago and was ~100.  He says numbers for blood sugars are always in the low 100s.      Observations/Objective: A&Ox3   Assessment and Plan: 1. Diabetes mellitus type 2 in nonobese ([place]) Sounds close to control from home numbers.   - metFORMIN (GLUCOPHAGE) 500 MG tablet; Take 2 with breakfast and 1 with dinner  Dispense: 270 tablet; Refill: 1  2. Essential hypertension Controlled-continue - amLODipine (NORVASC) 10 MG tablet; Take 1 tablet (10 mg total) by mouth daily.  Dispense: 90 tablet; Refill: 1 - atenolol (TENORMIN) 25 MG tablet; Take 1 tablet (25 mg total) by mouth daily.  Dispense: 90 tablet; Refill: 1 - lisinopril-hydrochlorothiazide (ZESTORETIC) 20-25 MG tablet; Take 1 tablet by mouth daily.  Dispense: 90 tablet; Refill: 3  3. Hyperlipidemia LDL goal <70 - atorvastatin (LIPITOR) 20 MG tablet; Take 1 tablet (20 mg total) by mouth daily at 6 PM.  Dispense: 90 tablet; Refill: 1  4. Medication refill  5. History of CVA with residual deficit - aspirin 81 MG EC tablet; Take 1 tablet (81 mg total) by mouth at bedtime.  Dispense: 90 tablet; Refill:  1 - atorvastatin (LIPITOR) 20 MG tablet; Take 1 tablet (20 mg total) by mouth daily at 6 PM.  Dispense: 90 tablet; Refill: 1 - clopidogrel (PLAVIX) 75 MG tablet; Take 1 tablet (75 mg total) by mouth daily.  Dispense: 90 tablet; Refill: 1   Follow Up Instructions: Keep September appt with [name]   I discussed the assessment and treatment plan with the patient. The patient was provided an opportunity to ask questions and all were answered. The patient agreed with the plan and demonstrated an understanding of the instructions.   The patient was advised to call back or seek an in-person evaluation if the symptoms worsen or if the condition fails to improve as anticipated.  I provided 12 minutes of non-face-to-face time during this encounter.   Zachary Caldron, PA-C  Patient ID: Zachary [name], male   DOB: February 18, 1971, 48 y.o.   MRN: [id number]

## 2019-04-30 ENCOUNTER — Other Ambulatory Visit: Payer: Self-pay

## 2019-04-30 ENCOUNTER — Ambulatory Visit: Payer: 59 | Attending: Nurse Practitioner | Admitting: Nurse Practitioner

## 2019-04-30 ENCOUNTER — Encounter: Payer: Self-pay | Admitting: Nurse Practitioner

## 2019-04-30 ENCOUNTER — Ambulatory Visit (HOSPITAL_BASED_OUTPATIENT_CLINIC_OR_DEPARTMENT_OTHER): Payer: 59 | Admitting: Pharmacist

## 2019-04-30 VITALS — BP 143/94 | HR 78 | Temp 98.8°F | Ht 70.0 in | Wt 220.0 lb

## 2019-04-30 DIAGNOSIS — I10 Essential (primary) hypertension: Secondary | ICD-10-CM | POA: Diagnosis not present

## 2019-04-30 DIAGNOSIS — N529 Male erectile dysfunction, unspecified: Secondary | ICD-10-CM

## 2019-04-30 DIAGNOSIS — Z0001 Encounter for general adult medical examination with abnormal findings: Secondary | ICD-10-CM | POA: Diagnosis not present

## 2019-04-30 DIAGNOSIS — Z0000 Encounter for general adult medical examination without abnormal findings: Secondary | ICD-10-CM | POA: Diagnosis not present

## 2019-04-30 DIAGNOSIS — Z1211 Encounter for screening for malignant neoplasm of colon: Secondary | ICD-10-CM

## 2019-04-30 DIAGNOSIS — E119 Type 2 diabetes mellitus without complications: Secondary | ICD-10-CM

## 2019-04-30 DIAGNOSIS — Z23 Encounter for immunization: Secondary | ICD-10-CM | POA: Diagnosis not present

## 2019-04-30 DIAGNOSIS — Z125 Encounter for screening for malignant neoplasm of prostate: Secondary | ICD-10-CM

## 2019-04-30 LAB — POCT GLYCOSYLATED HEMOGLOBIN (HGB A1C): Hemoglobin A1C: 7.3 % — AB (ref 4.0–5.6)

## 2019-04-30 LAB — GLUCOSE, POCT (MANUAL RESULT ENTRY): POC Glucose: 145 mg/dl — AB (ref 70–99)

## 2019-04-30 MED ORDER — SILDENAFIL CITRATE 100 MG PO TABS: 50.0000 mg | ORAL_TABLET | Freq: Every day | ORAL | 11 refills | Status: DC | PRN

## 2019-04-30 NOTE — Progress Notes (Signed)
Patient presents for vaccination against influenza per orders of Zelda. Consent given. Counseling provided. No contraindications exists. Vaccine administered without incident.   

## 2019-04-30 NOTE — Progress Notes (Signed)
Assessment & Plan:  Zachary [name] was seen today for annual exam.  Diagnoses and all orders for this visit:  Encounter for annual physical exam -     CBC -     Basic metabolic panel -     Lipid panel  Diabetes mellitus type 2 in nonobese (HCC) -     Glucose (CBG) -     HgB A1c -     Ambulatory referral to Ophthalmology Continue blood sugar control as discussed in office today, low carbohydrate diet, and regular physical exercise as tolerated, 150 minutes per week (30 min each day, 5 days per week, or 50 min 3 days per week). Keep blood sugar logs with fasting goal of 90-130 mg/dl, post prandial (after you eat) less than 180.  For Hypoglycemia: BS <60 and Hyperglycemia BS >400; contact the clinic ASAP. Annual eye exams and foot exams are recommended.   Essential hypertension Continue all antihypertensives as prescribed.  Remember to bring in your blood pressure log with you for your follow up appointment.  DASH/Mediterranean Diets are healthier choices for HTN.    Vasculogenic erectile dysfunction, unspecified vasculogenic erectile dysfunction type -     sildenafil (VIAGRA) 100 MG tablet; Take 0.5-1 tablets (50-100 mg total) by mouth daily as needed for erectile dysfunction.  Colon cancer screening -     Fecal occult blood, imunochemical([facility])    Patient has been counseled on age-appropriate routine health concerns for screening and prevention. These are reviewed and up-to-date. Referrals have been placed accordingly. Immunizations are up-to-date or declined.    Subjective:   Chief Complaint  Patient presents with  . Annual Exam    Pt. is here for a physical and BP check.    HPI Zachary [name] 48 y.o. male presents to office today for annual physical. Blood pressure is elevated today however patient states his home readings are much lower.  Will have him return to the office in a few weeks with his monitor for comparison.  He endorses taking atenolol 25 mg daily,  amlodipine 10 mg daily,  lisinopril-hydrochlorothiazide 20-25 mg daily. Denies chest pain, shortness of breath, palpitations, lightheadedness, dizziness, headaches or BLE edema.   Erectile Dysfunction: Patient complains of erectile dysfunction.  Onset of dysfunction was a few years ago after his stroke and was gradual in onset.  Patient states the nature of difficulty is both attaining and maintaining erection. Libido is not affected. Risk factors for ED include cardiovascular disease, beta blocker use and hypogonadism.   Previous treatment of ED includes none.   DM TYPE 2  Controlled.  Instructed patient he needs to work on dietary and exercise modifications including low-carb diet and exercising 3-5 times a week.  He is medication compliant taking 1000 mg of metformin in the a.m. and 500 mg of metformin in the p.m.  Taking statin and ACE.  Not monitoring blood glucose levels routinely at home.  He denies any symptoms of hyper or hypoglycemia.  He is overdue for an eye exam and I have referred him to ophthalmology today. Lab Results  Component Value Date   HGBA1C 7.3 (A) 04/30/2019   Review of Systems  Constitutional: Negative for fever, malaise/fatigue and weight loss.  HENT: Negative.  Negative for nosebleeds.   Eyes: Negative.  Negative for blurred vision, double vision and photophobia.  Respiratory: Negative.  Negative for cough and shortness of breath.   Cardiovascular: Negative.  Negative for chest pain, palpitations and leg swelling.  Gastrointestinal: Negative.  Negative for  heartburn, nausea and vomiting.  Genitourinary: Negative.        Erectile dysfunction  Musculoskeletal: Negative.  Negative for myalgias.  Skin: Negative.   Neurological: Negative.  Negative for dizziness, focal weakness, seizures and headaches.  Endo/Heme/Allergies: Negative.   Psychiatric/Behavioral: Negative.  Negative for suicidal ideas.      Past Medical History:  Diagnosis Date  . Diabetes mellitus  without complication ([place])   . Hypertension   . Stroke [facility]) 05/2017   Right [place] Stroke    History reviewed. No pertinent surgical history.  Family History  Problem Relation Age of Onset  . Hypertension Mother     Social History Reviewed with no changes to be made today.   Outpatient Medications Prior to Visit  Medication Sig Dispense Refill  . amLODipine (NORVASC) 10 MG tablet Take 1 tablet (10 mg total) by mouth daily. 90 tablet 1  . aspirin 81 MG EC tablet Take 1 tablet (81 mg total) by mouth at bedtime. 90 tablet 1  . atenolol (TENORMIN) 25 MG tablet Take 1 tablet (25 mg total) by mouth daily. 90 tablet 1  . atorvastatin (LIPITOR) 20 MG tablet Take 1 tablet (20 mg total) by mouth daily at 6 PM. 90 tablet 1  . BD ULTRA-FINE LANCETS lancets Use as instructed 100 each 12  . blood glucose meter kit and supplies KIT Dispense based on patient and insurance preference. Use up to four times daily as directed. (FOR ICD-9 250.00, 250.01). 1 each 0  . Blood Glucose Monitoring Suppl (TRUE METRIX METER) w/Device KIT 1 each by Does not apply route 3 (three) times daily. 1 kit 0  . clopidogrel (PLAVIX) 75 MG tablet Take 1 tablet (75 mg total) by mouth daily. 90 tablet 1  . glucose blood (TRUE METRIX BLOOD GLUCOSE TEST) test strip Use as instructed 100 each 12  . lisinopril-hydrochlorothiazide (ZESTORETIC) 20-25 MG tablet Take 1 tablet by mouth daily. 90 tablet 3  . metFORMIN (GLUCOPHAGE) 500 MG tablet Take 2 with breakfast and 1 with dinner 270 tablet 1   No facility-administered medications prior to visit.     No Known Allergies     Objective:    BP (!) 143/94 (BP Location: Left Arm, Patient Position: Sitting, Cuff Size: Normal)   Pulse 78   Temp 98.8 F (37.1 C) (Oral)   Ht 5' 10"  (1.778 m)   Wt 220 lb (99.8 kg)   SpO2 99%   BMI 31.57 kg/m  Wt Readings from Last 3 Encounters:  04/30/19 220 lb (99.8 kg)  07/25/18 226 lb 6.4 oz (102.7 kg)  11/22/17 208 lb 9.6 oz (94.6 kg)    BP Readings from Last 3 Encounters:  04/30/19 (!) 143/94  07/25/18 (!) 144/87  12/20/17 (!) 130/93   Lab Results  Component Value Date   HGBA1C 7.3 (A) 04/30/2019   Physical Exam Constitutional:      Appearance: He is well-developed.  HENT:     Head: Normocephalic and atraumatic.     Right Ear: Hearing, tympanic membrane, ear canal and external ear normal.     Left Ear: Hearing, tympanic membrane, ear canal and external ear normal.     Nose: Nose normal. No mucosal edema or rhinorrhea.     Mouth/Throat:     Mouth: Mucous membranes are moist.     Pharynx: Uvula midline. No posterior oropharyngeal erythema.     Tonsils: No tonsillar exudate. 1+ on the right. 1+ on the left.  Eyes:     General:  Lids are normal. No scleral icterus.    Conjunctiva/sclera: Conjunctivae normal.     Pupils: Pupils are equal, round, and reactive to light.  Neck:     Musculoskeletal: Normal range of motion and neck supple.     Thyroid: No thyromegaly.     Trachea: No tracheal deviation.  Cardiovascular:     Rate and Rhythm: Normal rate and regular rhythm.     Pulses:          Dorsalis pedis pulses are 2+ on the right side and 2+ on the left side.       Posterior tibial pulses are 2+ on the right side and 2+ on the left side.     Heart sounds: Normal heart sounds. No murmur. No friction rub. No gallop.   Pulmonary:     Effort: Pulmonary effort is normal. No respiratory distress.     Breath sounds: Normal breath sounds. No wheezing or rales.  Chest:     Chest wall: No mass or tenderness.     Breasts:        Right: No inverted nipple, mass, nipple discharge, skin change or tenderness.        Left: No inverted nipple, mass, nipple discharge, skin change or tenderness.  Abdominal:     General: Bowel sounds are normal. There is no distension.     Palpations: Abdomen is soft. There is no mass.     Tenderness: There is no abdominal tenderness. There is no guarding or rebound.     Hernia: There is no  hernia in the left inguinal area.  Genitourinary:    Penis: Normal.      Scrotum/Testes: Normal.        Right: Mass, tenderness or swelling not present. Right testis is descended. Cremasteric reflex is present.         Left: Mass, tenderness or swelling not present. Left testis is descended. Cremasteric reflex is present.   Musculoskeletal: Normal range of motion.        General: No tenderness or deformity.  Feet:     Right foot:     Protective Sensation: 10 sites tested. 10 sites sensed.     Skin integrity: Callus and dry skin present.     Left foot:     Protective Sensation: 10 sites tested. 10 sites sensed.     Skin integrity: Callus and dry skin present.  Lymphadenopathy:     Cervical: No cervical adenopathy.     Lower Body: No right inguinal adenopathy. No left inguinal adenopathy.  Skin:    General: Skin is warm and dry.     Capillary Refill: Capillary refill takes less than 2 seconds.     Findings: No erythema.  Neurological:     Mental Status: He is alert and oriented to person, place, and time.     Cranial Nerves: No cranial nerve deficit.     Sensory: Sensation is intact.     Motor: Motor function is intact. No abnormal muscle tone.     Coordination: Coordination normal. Heel to Shin Test normal.     Gait: Gait is intact.     Deep Tendon Reflexes: Reflexes normal.     Reflex Scores:      Patellar reflexes are 1+ on the right side and 2+ on the left side. Psychiatric:        Attention and Perception: Attention and perception normal.        Mood and Affect: Mood and affect normal.  Speech: Speech normal.        Behavior: Behavior normal.        Thought Content: Thought content normal.        Cognition and Memory: Cognition and memory normal.        Judgment: Judgment normal.          Patient has been counseled extensively about nutrition and exercise as well as the importance of adherence with medications and regular follow-up. The patient was given clear  instructions to go to ER or return to medical center if symptoms don't improve, worsen or new problems develop. The patient verbalized understanding.   Follow-up: Return in about 3 weeks (around 05/21/2019) for BP recheck bring home monitor for comparision due to high office readings then see me in 3 month.   Gildardo Pounds, FNP-BC [facility] and [place], [place]   04/30/2019, 1:10 PM

## 2019-05-01 LAB — CBC
Hematocrit: 42.2 % (ref 37.5–51.0)
Hemoglobin: 14.2 g/dL (ref 13.0–17.7)
MCH: 28 pg (ref 26.6–33.0)
MCHC: 33.6 g/dL (ref 31.5–35.7)
MCV: 83 fL (ref 79–97)
Platelets: 460 10*3/uL — ABNORMAL HIGH (ref 150–450)
RBC: 5.08 x10E6/uL (ref 4.14–5.80)
RDW: 13 % (ref 11.6–15.4)
WBC: 7 10*3/uL (ref 3.4–10.8)

## 2019-05-01 LAB — BASIC METABOLIC PANEL
BUN/Creatinine Ratio: 12 (ref 9–20)
BUN: 16 mg/dL (ref 6–24)
CO2: 24 mmol/L (ref 20–29)
Calcium: 9.5 mg/dL (ref 8.7–10.2)
Chloride: 97 mmol/L (ref 96–106)
Creatinine, Ser: 1.32 mg/dL — ABNORMAL HIGH (ref 0.76–1.27)
GFR calc Af Amer: 72 mL/min/{1.73_m2} (ref >59)
GFR calc non Af Amer: 62 mL/min/{1.73_m2} (ref >59)
Glucose: 142 mg/dL — ABNORMAL HIGH (ref 65–99)
Potassium: 4.1 mmol/L (ref 3.5–5.2)
Sodium: 138 mmol/L (ref 134–144)

## 2019-05-01 LAB — LIPID PANEL
Chol/HDL Ratio: 4.1 ratio (ref 0.0–5.0)
Cholesterol, Total: 161 mg/dL (ref 100–199)
HDL: 39 mg/dL — ABNORMAL LOW (ref >39)
LDL Chol Calc (NIH): 103 mg/dL — ABNORMAL HIGH (ref 0–99)
Triglycerides: 100 mg/dL (ref 0–149)
VLDL Cholesterol Cal: 19 mg/dL (ref 5–40)

## 2019-05-03 LAB — SPECIMEN STATUS REPORT

## 2019-05-03 LAB — PSA: Prostate Specific Ag, Serum: 0.7 ng/mL (ref 0.0–4.0)

## 2019-05-21 ENCOUNTER — Ambulatory Visit: Payer: 59 | Admitting: Pharmacist

## 2019-06-05 LAB — FECAL OCCULT BLOOD, IMMUNOCHEMICAL

## 2019-07-03 ENCOUNTER — Other Ambulatory Visit: Payer: Self-pay | Admitting: Physician Assistant

## 2019-07-03 DIAGNOSIS — I10 Essential (primary) hypertension: Secondary | ICD-10-CM

## 2019-07-03 MED ORDER — ATENOLOL 25 MG PO TABS: 25.0000 mg | ORAL_TABLET | Freq: Every day | ORAL | 1 refills | Status: DC

## 2019-07-30 ENCOUNTER — Ambulatory Visit: Payer: 59 | Admitting: Nurse Practitioner

## 2019-08-04 ENCOUNTER — Other Ambulatory Visit: Payer: Self-pay | Admitting: Physician Assistant

## 2019-08-04 DIAGNOSIS — I10 Essential (primary) hypertension: Secondary | ICD-10-CM

## 2019-08-26 ENCOUNTER — Ambulatory Visit: Payer: 59 | Attending: Nurse Practitioner | Admitting: Nurse Practitioner

## 2019-08-26 ENCOUNTER — Encounter: Payer: Self-pay | Admitting: Nurse Practitioner

## 2019-08-26 ENCOUNTER — Other Ambulatory Visit: Payer: Self-pay

## 2019-08-26 DIAGNOSIS — E785 Hyperlipidemia, unspecified: Secondary | ICD-10-CM | POA: Diagnosis not present

## 2019-08-26 DIAGNOSIS — N521 Erectile dysfunction due to diseases classified elsewhere: Secondary | ICD-10-CM

## 2019-08-26 DIAGNOSIS — E1159 Type 2 diabetes mellitus with other circulatory complications: Secondary | ICD-10-CM

## 2019-08-26 DIAGNOSIS — IMO0002 Reserved for concepts with insufficient information to code with codable children: Secondary | ICD-10-CM

## 2019-08-26 DIAGNOSIS — I6930 Unspecified sequelae of cerebral infarction: Secondary | ICD-10-CM

## 2019-08-26 DIAGNOSIS — I10 Essential (primary) hypertension: Secondary | ICD-10-CM

## 2019-08-26 DIAGNOSIS — E1165 Type 2 diabetes mellitus with hyperglycemia: Secondary | ICD-10-CM

## 2019-08-26 DIAGNOSIS — D75839 Thrombocytosis, unspecified: Secondary | ICD-10-CM

## 2019-08-26 DIAGNOSIS — D473 Essential (hemorrhagic) thrombocythemia: Secondary | ICD-10-CM

## 2019-08-26 MED ORDER — ATENOLOL 25 MG PO TABS: 25.0000 mg | ORAL_TABLET | Freq: Every day | ORAL | 1 refills | Status: DC

## 2019-08-26 MED ORDER — LISINOPRIL-HYDROCHLOROTHIAZIDE 20-25 MG PO TABS: 1.0000 | ORAL_TABLET | Freq: Every day | ORAL | 3 refills | Status: DC

## 2019-08-26 MED ORDER — METFORMIN HCL 500 MG PO TABS: ORAL_TABLET | ORAL | 1 refills | Status: DC

## 2019-08-26 MED ORDER — CLOPIDOGREL BISULFATE 75 MG PO TABS: 75.0000 mg | ORAL_TABLET | Freq: Every day | ORAL | 1 refills | Status: DC

## 2019-08-26 MED ORDER — AMLODIPINE BESYLATE 10 MG PO TABS: 10.0000 mg | ORAL_TABLET | Freq: Every day | ORAL | 0 refills | Status: DC

## 2019-08-26 MED ORDER — ATORVASTATIN CALCIUM 20 MG PO TABS: 20.0000 mg | ORAL_TABLET | Freq: Every day | ORAL | 1 refills | Status: DC

## 2019-08-26 MED ORDER — SILDENAFIL CITRATE 100 MG PO TABS: 50.0000 mg | ORAL_TABLET | Freq: Every day | ORAL | 11 refills | Status: DC | PRN

## 2019-08-26 NOTE — Progress Notes (Signed)
Virtual Visit via Telephone Note Due to national recommendations of social distancing due to [place] 19, telehealth visit is felt to be most appropriate for this patient at this time.  I discussed the limitations, risks, security and privacy concerns of performing an evaluation and management service by telephone and the availability of in person appointments. I also discussed with the patient that there may be a patient responsible charge related to this service. The patient expressed understanding and agreed to proceed.    I connected with Zachary [name] on 08/26/19  at   8:50 AM EST  EDT by telephone and verified that I am speaking with the correct person using two identifiers.   Consent I discussed the limitations, risks, security and privacy concerns of performing an evaluation and management service by telephone and the availability of in person appointments. I also discussed with the patient that there may be a patient responsible charge related to this service. The patient expressed understanding and agreed to proceed.   Location of Patient: [place] of Provider: [place] and [employer] Office    Persons participating in Telemedicine visit: Zachary Rankins FNP-BC [place]    History of Present Illness: Telemedicine visit for: F/U  ESSENTIAL HYPERTENSION He checked his blood pressure at home today with initial reading 116/83  and second reading later this morning 135/85. Denies chest pain, shortness of breath, palpitations, lightheadedness, dizziness, headaches or BLE edema.  Endorses medication compliance taking amlodipine 10 mg daily, atenolol 25 mg daily, lisinopril-hydrochlorothiazide 20-25 mg daily. BP Readings from Last 3 Encounters:  04/30/19 (!) 143/94  07/25/18 (!) 144/87  12/20/17 (!) 130/93    DM TYPE 2 He has not been monitoring his blood glucose level however he denies any symptoms of hypo or hyperglycemic symptoms.   Currently taking Metformin 1000 mg with breakfast and 500 mg with dinner.  He is overdue for eye exam.  He is on ACE and statin. Lab Results  Component Value Date   HGBA1C 7.3 (A) 04/30/2019   Dyslipidemia LDL not at goal of less than 70 however he does endorse medication compliance taking atorvastatin 20 mg daily which will be increased to 40 mg today.  He denies any statin intolerance or myalgias. Lab Results  Component Value Date   LDLCALC 103 (H) 04/30/2019     Past Medical History:  Diagnosis Date  . Diabetes mellitus without complication ([place])   . Hypertension   . Stroke [facility]) 05/2017   Right [place] Stroke    History reviewed. No pertinent surgical history.  Family History  Problem Relation Age of Onset  . Hypertension Mother     Social History   Socioeconomic History  . Marital status: Married    Spouse name: Not on file  . Number of children: Not on file  . Years of education: Not on file  . Highest education level: Not on file  Occupational History  . Not on file  Tobacco Use  . Smoking status: Never Smoker  . Smokeless tobacco: Never Used  Substance and Sexual Activity  . Alcohol use: Not Currently    Comment: occ  . Drug use: No  . Sexual activity: Yes  Other Topics Concern  . Not on file  Social History Narrative  . Not on file   Social Determinants of Health   Financial Resource Strain:   . Difficulty of Paying Living Expenses: Not on file  Food Insecurity:   . Worried About  Running Out of Food in the Last Year: Not on file  . Ran Out of Food in the Last Year: Not on file  Transportation Needs:   . Lack of Transportation (Medical): Not on file  . Lack of Transportation (Non-Medical): Not on file  Physical Activity:   . Days of Exercise per Week: Not on file  . Minutes of Exercise per Session: Not on file  Stress:   . Feeling of Stress : Not on file  Social Connections:   . Frequency of Communication with Friends and Family: Not on file  .  Frequency of Social Gatherings with Friends and Family: Not on file  . Attends Religious Services: Not on file  . Active Member of Clubs or Organizations: Not on file  . Attends Archivist Meetings: Not on file  . Marital Status: Not on file     Observations/Objective: Awake, alert and oriented x 3   Review of Systems  Constitutional: Negative for fever, malaise/fatigue and weight loss.  HENT: Negative.  Negative for nosebleeds.   Eyes: Negative.  Negative for blurred vision, double vision and photophobia.  Respiratory: Negative.  Negative for cough and shortness of breath.   Cardiovascular: Negative.  Negative for chest pain, palpitations and leg swelling.  Gastrointestinal: Negative.  Negative for heartburn, nausea and vomiting.  Musculoskeletal: Negative.  Negative for myalgias.  Neurological: Negative.  Negative for dizziness, focal weakness, seizures and headaches.  Psychiatric/Behavioral: Negative.  Negative for suicidal ideas.    Assessment and Plan: Zachary [name] was seen today for follow-up.  Diagnoses and all orders for this visit:  DM (diabetes mellitus), type 2, uncontrolled with complications ([place]) -     metFORMIN (GLUCOPHAGE) 500 MG tablet; Take 2 with breakfast and 1 with dinner -     A1c; Future -     urine micro; Future Continue blood sugar control as discussed in office today, low carbohydrate diet, and regular physical exercise as tolerated, 150 minutes per week (30 min each day, 5 days per week, or 50 min 3 days per week). Keep blood sugar logs with fasting goal of 90-130 mg/dl, post prandial (after you eat) less than 180.  For Hypoglycemia: BS <60 and Hyperglycemia BS >400; contact the clinic ASAP. Annual eye exams and foot exams are recommended.   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  Essential hypertension -     lisinopril-hydrochlorothiazide (ZESTORETIC) 20-25 MG tablet; Take 1 tablet by mouth daily. -     atenolol (TENORMIN) 25 MG tablet; Take 1 tablet (25 mg total) by mouth daily. -     amLODipine (NORVASC) 10 MG tablet; Take 1 tablet (10 mg total) by mouth daily. -     CMP14+EGFR; Future  Continue all antihypertensives as prescribed.  Remember to bring in your blood pressure log with you for your follow up appointment.  DASH/Mediterranean Diets are healthier choices for HTN.    History of CVA with residual deficit -     atorvastatin (LIPITOR) 20 MG tablet; Take 1 tablet (20 mg total) by mouth daily at 6 PM. -     clopidogrel (PLAVIX) 75 MG tablet; Take 1 tablet (75 mg total) by mouth daily. -     Lipid Panel; Future  Hyperlipidemia LDL goal <70 -     atorvastatin (LIPITOR) 20 MG tablet; Take 1 tablet (  20 mg total) by mouth daily at 6 PM. -     Lipid Panel; Future INSTRUCTIONS: Work on a low fat, heart healthy diet and participate in regular aerobic exercise program by working out at least 150 minutes per week; 5 days a week-30 minutes per day. Avoid red meat/beef/steak,  fried foods. junk foods, sodas, sugary drinks, unhealthy snacking, alcohol and smoking.  Drink at least 80 oz of water per day and monitor your carbohydrate intake daily.    Thrombocytosis (HCC) -     CBC; Future     Follow Up Instructions Return in about 2 months (around 10/24/2019).     I discussed the assessment and treatment plan with the patient. The patient was provided an opportunity to ask questions and all were answered. The patient agreed with the plan and demonstrated an understanding of the instructions.   The patient was advised to call back or seek an in-person evaluation if the symptoms worsen or if the condition fails to improve as anticipated.  I provided 14 minutes of non-face-to-face time during this encounter including median intraservice time, reviewing  previous notes, labs, imaging, medications and explaining diagnosis and management.  Gildardo Pounds, FNP-BC

## 2019-09-03 ENCOUNTER — Other Ambulatory Visit: Payer: Self-pay

## 2019-09-03 ENCOUNTER — Ambulatory Visit: Payer: 59 | Attending: Nurse Practitioner

## 2019-09-03 DIAGNOSIS — IMO0002 Reserved for concepts with insufficient information to code with codable children: Secondary | ICD-10-CM

## 2019-09-03 DIAGNOSIS — I10 Essential (primary) hypertension: Secondary | ICD-10-CM

## 2019-09-03 DIAGNOSIS — E785 Hyperlipidemia, unspecified: Secondary | ICD-10-CM

## 2019-09-03 DIAGNOSIS — D473 Essential (hemorrhagic) thrombocythemia: Secondary | ICD-10-CM

## 2019-09-03 DIAGNOSIS — D75839 Thrombocytosis, unspecified: Secondary | ICD-10-CM

## 2019-09-03 DIAGNOSIS — E1165 Type 2 diabetes mellitus with hyperglycemia: Secondary | ICD-10-CM

## 2019-09-03 DIAGNOSIS — I6930 Unspecified sequelae of cerebral infarction: Secondary | ICD-10-CM

## 2019-09-04 LAB — CMP14+EGFR
ALT: 16 IU/L (ref 0–44)
AST: 16 IU/L (ref 0–40)
Albumin/Globulin Ratio: 1.5 (ref 1.2–2.2)
Albumin: 4.3 g/dL (ref 4.0–5.0)
Alkaline Phosphatase: 76 IU/L (ref 39–117)
BUN/Creatinine Ratio: 12 (ref 9–20)
BUN: 15 mg/dL (ref 6–24)
Bilirubin Total: 0.4 mg/dL (ref 0.0–1.2)
CO2: 25 mmol/L (ref 20–29)
Calcium: 9.8 mg/dL (ref 8.7–10.2)
Chloride: 97 mmol/L (ref 96–106)
Creatinine, Ser: 1.3 mg/dL — ABNORMAL HIGH (ref 0.76–1.27)
GFR calc Af Amer: 74 mL/min/{1.73_m2} (ref >59)
GFR calc non Af Amer: 64 mL/min/{1.73_m2} (ref >59)
Globulin, Total: 2.9 g/dL (ref 1.5–4.5)
Glucose: 177 mg/dL — ABNORMAL HIGH (ref 65–99)
Potassium: 4.1 mmol/L (ref 3.5–5.2)
Sodium: 139 mmol/L (ref 134–144)
Total Protein: 7.2 g/dL (ref 6.0–8.5)

## 2019-09-04 LAB — MICROALBUMIN / CREATININE URINE RATIO
Creatinine, Urine: 215.6 mg/dL
Microalb/Creat Ratio: 3 mg/g creat (ref 0–29)
Microalbumin, Urine: 7 ug/mL

## 2019-09-04 LAB — CBC
Hematocrit: 41.5 % (ref 37.5–51.0)
Hemoglobin: 13.8 g/dL (ref 13.0–17.7)
MCH: 27.9 pg (ref 26.6–33.0)
MCHC: 33.3 g/dL (ref 31.5–35.7)
MCV: 84 fL (ref 79–97)
Platelets: 426 10*3/uL (ref 150–450)
RBC: 4.95 x10E6/uL (ref 4.14–5.80)
RDW: 12.9 % (ref 11.6–15.4)
WBC: 8 10*3/uL (ref 3.4–10.8)

## 2019-09-04 LAB — LIPID PANEL
Chol/HDL Ratio: 3.5 ratio (ref 0.0–5.0)
Cholesterol, Total: 134 mg/dL (ref 100–199)
HDL: 38 mg/dL — ABNORMAL LOW (ref >39)
LDL Chol Calc (NIH): 72 mg/dL (ref 0–99)
Triglycerides: 134 mg/dL (ref 0–149)
VLDL Cholesterol Cal: 24 mg/dL (ref 5–40)

## 2019-09-04 LAB — HEMOGLOBIN A1C
Est. average glucose Bld gHb Est-mCnc: 169 mg/dL
Hgb A1c MFr Bld: 7.5 % — ABNORMAL HIGH (ref 4.8–5.6)

## 2019-10-31 ENCOUNTER — Ambulatory Visit: Payer: 59 | Attending: Internal Medicine

## 2019-10-31 DIAGNOSIS — Z23 Encounter for immunization: Secondary | ICD-10-CM

## 2019-10-31 NOTE — Progress Notes (Signed)
   Covid-19 Vaccination Clinic  Name:  Zachary [name]    MRN: [id number] DOB: 1971-03-11  10/31/2019  [name] was observed post Covid-19 immunization for 15 minutes without incident. He was provided with Vaccine Information Sheet and instruction to access the V-Safe system.   [name] was instructed to call 911 with any severe reactions post vaccine: Marland Kitchen Difficulty breathing  . Swelling of face and throat  . A fast heartbeat  . A bad rash all over body  . Dizziness and weakness   Immunizations Administered    Name Date Dose VIS Date Route   Pfizer COVID-19 Vaccine 10/31/2019  3:16 PM 0.3 mL 07/26/2019 Intramuscular   Manufacturer: [place]   Lot: [id number]   [place]: [id number]

## 2019-11-26 ENCOUNTER — Ambulatory Visit: Payer: 59 | Attending: Internal Medicine

## 2019-11-26 DIAGNOSIS — Z23 Encounter for immunization: Secondary | ICD-10-CM

## 2019-11-26 NOTE — Progress Notes (Signed)
   Covid-19 Vaccination Clinic  Name:  Zachary [name]    MRN: [id number] DOB: 02-Jan-1971  11/26/2019  [name] was observed post Covid-19 immunization for 15 minutes without incident. He was provided with Vaccine Information Sheet and instruction to access the V-Safe system.   [name] was instructed to call 911 with any severe reactions post vaccine: Marland Kitchen Difficulty breathing  . Swelling of face and throat  . A fast heartbeat  . A bad rash all over body  . Dizziness and weakness   Immunizations Administered    Name Date Dose VIS Date Route   Pfizer COVID-19 Vaccine 11/26/2019  8:33 AM 0.3 mL 07/26/2019 Intramuscular   Manufacturer: [place]   Lot: [id number]   [place]: [id number]

## 2020-02-23 ENCOUNTER — Other Ambulatory Visit: Payer: Self-pay | Admitting: Nurse Practitioner

## 2020-02-23 DIAGNOSIS — I10 Essential (primary) hypertension: Secondary | ICD-10-CM

## 2020-02-23 NOTE — Telephone Encounter (Signed)
30 day courtesy RF Requested Prescriptions  Pending Prescriptions Disp Refills   amLODipine (NORVASC) 10 MG tablet [Pharmacy Med Name: AMLODIPINE BESYLATE 10 MG TAB] 30 tablet 0    Sig: TAKE 1 TABLET BY MOUTH EVERY DAY     Cardiovascular:  Calcium Channel Blockers Failed - 02/23/2020 11:55 AM      Failed - Last BP in normal range    BP Readings from Last 1 Encounters:  04/30/19 (!) 143/94         Failed - Valid encounter within last 6 months    Recent Outpatient Visits          6 months ago DM (diabetes mellitus), type 2, uncontrolled with complications [facility])   [place], Vernia Buff, NP   9 months ago Need for influenza vaccination   [place], RPH-CPP   9 months ago Encounter for annual physical exam   [name], [place] W, NP   11 months ago Diabetes mellitus type 2 in nonobese [facility])   [place], [place], [place]   1 year ago Diabetes mellitus type 2 in nonobese [facility])   [place], [place], [place]

## 2020-03-22 ENCOUNTER — Other Ambulatory Visit: Payer: Self-pay | Admitting: Nurse Practitioner

## 2020-03-22 DIAGNOSIS — I10 Essential (primary) hypertension: Secondary | ICD-10-CM

## 2020-03-22 NOTE — Telephone Encounter (Signed)
Requested medication (s) are due for refill today: yes  Requested medication (s) are on the active medication list: yes  Last refill:  02/23/20  Future visit scheduled: no  Notes to clinic:  called pt and LM on Vm to call office to make appt for med refill   Requested Prescriptions  Pending Prescriptions Disp Refills   amLODipine (NORVASC) 10 MG tablet [Pharmacy Med Name: AMLODIPINE BESYLATE 10 MG TAB] 30 tablet 0    Sig: TAKE 1 TABLET BY MOUTH EVERY DAY      Cardiovascular:  Calcium Channel Blockers Failed - 03/22/2020  1:31 PM      Failed - Last BP in normal range    BP Readings from Last 1 Encounters:  04/30/19 (!) 143/94          Failed - Valid encounter within last 6 months    Recent Outpatient Visits           6 months ago DM (diabetes mellitus), type 2, uncontrolled with complications [facility])   [place], Vernia Buff, NP   10 months ago Need for influenza vaccination   [place], RPH-CPP   10 months ago Encounter for annual physical exam   [place], [place] W, NP   1 year ago Diabetes mellitus type 2 in nonobese [facility])   [place], [place], [place]   1 year ago Diabetes mellitus type 2 in nonobese [place] [facility])   [place], [place], [place]

## 2020-04-07 ENCOUNTER — Other Ambulatory Visit: Payer: Self-pay | Admitting: Nurse Practitioner

## 2020-04-07 DIAGNOSIS — I6930 Unspecified sequelae of cerebral infarction: Secondary | ICD-10-CM

## 2020-04-07 NOTE — Telephone Encounter (Signed)
Requested medications are due for refill today?  Yes  Requested medications are on active medication list?  Yes  Last Refill:   08/26/2019  # 90 with one refill  Future visit scheduled?  No  Notes to Clinic:  Medication failed RX refill protocol due to no valid encounter in the past 6 months and no labs within the past 180 day.  Last labs were performed on 08/24/2019.

## 2020-06-28 ENCOUNTER — Other Ambulatory Visit: Payer: Self-pay | Admitting: Nurse Practitioner

## 2020-06-28 DIAGNOSIS — I10 Essential (primary) hypertension: Secondary | ICD-10-CM

## 2020-06-28 NOTE — Telephone Encounter (Signed)
Attempted to contact patient no answer to schedule follow up

## 2020-10-21 ENCOUNTER — Other Ambulatory Visit: Payer: Self-pay | Admitting: Nurse Practitioner

## 2020-10-21 DIAGNOSIS — I10 Essential (primary) hypertension: Secondary | ICD-10-CM

## 2020-10-21 NOTE — Telephone Encounter (Signed)
Requested medication (s) are due for refill today: expired medication  Requested medication (s) are on the active medication list: yes  Last refill:  08/26/19 #90 3 refills   Future visit scheduled: no  Notes to clinic:  expired medications. Called patient to schedule appt. No valid encounter within 6 months. No answer, left voicemail to call back and schedule appt.     Requested Prescriptions  Pending Prescriptions Disp Refills   lisinopril-hydrochlorothiazide (ZESTORETIC) 20-25 MG tablet [Pharmacy Med Name: LISINOPRIL-HCTZ 20-25 MG TAB] 90 tablet 3    Sig: TAKE 1 TABLET BY MOUTH EVERY DAY      Cardiovascular:  ACEI + Diuretic Combos Failed - 10/21/2020 10:42 AM      Failed - Na in normal range and within 180 days    Sodium  Date Value Ref Range Status  09/03/2019 139 134 - 144 mmol/L Final          Failed - K in normal range and within 180 days    Potassium  Date Value Ref Range Status  09/03/2019 4.1 3.5 - 5.2 mmol/L Final          Failed - Cr in normal range and within 180 days    Creatinine, Ser  Date Value Ref Range Status  09/03/2019 1.30 (H) 0.76 - 1.27 mg/dL Final          Failed - Ca in normal range and within 180 days    Calcium  Date Value Ref Range Status  09/03/2019 9.8 8.7 - 10.2 mg/dL Final   Calcium, Ion  Date Value Ref Range Status  03/18/2017 1.10 (L) 1.15 - 1.40 mmol/L Final          Failed - Last BP in normal range    BP Readings from Last 1 Encounters:  04/30/19 (!) 143/94          Failed - Valid encounter within last 6 months    Recent Outpatient Visits           1 year ago DM (diabetes mellitus), type 2, uncontrolled with complications [facility])   [place], Vernia Buff, NP   1 year ago Need for influenza vaccination   [place], RPH-CPP   1 year ago Encounter for annual physical exam   [place], Vernia Buff,  NP   1 year ago Diabetes mellitus type 2 in nonobese [facility])   [place], [place], [place]   2 years ago Diabetes mellitus type 2 in nonobese [facility])   [place], [place] - Patient is not pregnant

## 2020-10-22 NOTE — Telephone Encounter (Signed)
Request will be denied. Pt was last seen 08/26/19. Pt will need to be seen to get refill.

## 2020-10-27 ENCOUNTER — Other Ambulatory Visit: Payer: Self-pay | Admitting: Nurse Practitioner

## 2020-10-27 DIAGNOSIS — I10 Essential (primary) hypertension: Secondary | ICD-10-CM

## 2020-10-27 DIAGNOSIS — E1159 Type 2 diabetes mellitus with other circulatory complications: Secondary | ICD-10-CM

## 2020-10-27 DIAGNOSIS — N521 Erectile dysfunction due to diseases classified elsewhere: Secondary | ICD-10-CM

## 2020-10-27 DIAGNOSIS — I6930 Unspecified sequelae of cerebral infarction: Secondary | ICD-10-CM

## 2020-10-27 NOTE — Telephone Encounter (Signed)
Medication Refill - Medication: amLODipine (NORVASC) 10 MG tablet atenolol (TENORMIN) 25 MG tablet  clopidogrel (PLAVIX) 75 MG tablet sildenafil (VIAGRA) 100 MG tablet [ lisinopril-hydrochlorothiazide (ZESTORETIC) 20-25 MG tablet   Pt is requesting to have this refilled to last him until his next scheduled appt.    Has the patient contacted their pharmacy? No. (Agent: If no, request that the patient contact the pharmacy for the refill.) (Agent: If yes, when and what did the pharmacy advise?)  Preferred Pharmacy (with phone number or street name):  [id number] - [place], [place] - [address]  [address] [place] [address]  Phone: [phone] Fax: [phone]     Agent: Please be advised that RX refills may take up to 3 business days. We ask that you follow-up with your pharmacy.

## 2020-10-27 NOTE — Telephone Encounter (Signed)
Requested medication (s) are due for refill today: Yes  Requested medication (s) are on the active medication list: Yes  Last refill:  1 year ago  Future visit scheduled: Yes  Notes to clinic:  Unable to refill per protocol, Rx expired      Requested Prescriptions  Pending Prescriptions Disp Refills   sildenafil (VIAGRA) 100 MG tablet 5 tablet 11    Sig: Take 0.5-1 tablets (50-100 mg total) by mouth daily as needed for erectile dysfunction.      Urology: Erectile Dysfunction Agents Failed - 10/27/2020  4:45 PM      Failed - Last BP in normal range    BP Readings from Last 1 Encounters:  04/30/19 (!) 143/94          Failed - Valid encounter within last 12 months    Recent Outpatient Visits           1 year ago DM (diabetes mellitus), type 2, uncontrolled with complications [facility])   [place], Vernia Buff, NP   1 year ago Need for influenza vaccination   [place], RPH-CPP   1 year ago Encounter for annual physical exam   [place], [place] W, NP   1 year ago Diabetes mellitus type 2 in nonobese [facility])   [place], [place], [place]   2 years ago Diabetes mellitus type 2 in nonobese [facility])   [place], Dionne [place], [place]       Future Appointments             In 1 month Gildardo Pounds, NP [place]               lisinopril-hydrochlorothiazide (ZESTORETIC) 20-25 MG tablet 90 tablet 3    Sig: Take 1 tablet by mouth daily.      Cardiovascular:  ACEI + Diuretic Combos Failed - 10/27/2020  4:45 PM      Failed - Na in normal range and within 180 days    Sodium  Date Value Ref Range Status  09/03/2019 139 134 - 144 mmol/L Final          Failed - K in normal range and within 180 days    Potassium  Date Value Ref Range Status   09/03/2019 4.1 3.5 - 5.2 mmol/L Final          Failed - Cr in normal range and within 180 days    Creatinine, Ser  Date Value Ref Range Status  09/03/2019 1.30 (H) 0.76 - 1.27 mg/dL Final          Failed - Ca in normal range and within 180 days    Calcium  Date Value Ref Range Status  09/03/2019 9.8 8.7 - 10.2 mg/dL Final   Calcium, Ion  Date Value Ref Range Status  03/18/2017 1.10 (L) 1.15 - 1.40 mmol/L Final          Failed - Last BP in normal range    BP Readings from Last 1 Encounters:  04/30/19 (!) 143/94          Failed - Valid encounter within last 6 months    Recent Outpatient Visits           1 year ago DM (diabetes mellitus), type 2, uncontrolled with complications ([place])   [facility] Gildardo Pounds, NP   1 year ago Need for influenza vaccination   [place], RPH-CPP   1 year ago Encounter for annual physical exam   [place], [place] W, NP   1 year ago Diabetes mellitus type 2 in nonobese [facility])   [place], [place], [place]   2 years ago Diabetes mellitus type 2 in nonobese [facility])   [place], [place], [place]       Future Appointments             In 1 month Gildardo Pounds, NP [place] - Patient is not pregnant        clopidogrel (PLAVIX) 75 MG tablet 90 tablet 1    Sig: Take 1 tablet (75 mg total) by mouth daily.      Hematology: Antiplatelets - clopidogrel Failed - 10/27/2020  4:45 PM      Failed - Evaluate AST, ALT within 2 months of therapy initiation.      Failed - ALT in normal range and within 360 days    ALT  Date Value Ref Range Status  09/03/2019 16 0 - 44 IU/L Final          Failed - AST in normal range and within 360 days    AST  Date Value Ref Range Status  09/03/2019  16 0 - 40 IU/L Final          Failed - HCT in normal range and within 180 days    Hematocrit  Date Value Ref Range Status  09/03/2019 41.5 37.5 - 51.0 % Final          Failed - HGB in normal range and within 180 days    Hemoglobin  Date Value Ref Range Status  09/03/2019 13.8 13.0 - 17.7 g/dL Final          Failed - PLT in normal range and within 180 days    Platelets  Date Value Ref Range Status  09/03/2019 426 150 - 450 x10E3/uL Final          Failed - Valid encounter within last 6 months    Recent Outpatient Visits           1 year ago DM (diabetes mellitus), type 2, uncontrolled with complications [facility])   [place], Vernia Buff, NP   1 year ago Need for influenza vaccination   [place], RPH-CPP   1 year ago Encounter for annual physical exam   [place], [place] W, NP   1 year ago Diabetes mellitus type 2 in nonobese [facility])   [place], [place], [place]   2 years ago Diabetes mellitus type 2 in nonobese [facility])   [place], [place], [place]       Future Appointments             In 1 month Gildardo Pounds, NP [place]               atenolol (TENORMIN) 25 MG tablet 90 tablet 1    Sig: Take  1 tablet (25 mg total) by mouth daily.      Cardiovascular:  Beta Blockers Failed - 10/27/2020  4:45 PM      Failed - Last BP in normal range    BP Readings from Last 1 Encounters:  04/30/19 (!) 143/94          Failed - Valid encounter within last 6 months    Recent Outpatient Visits           1 year ago DM (diabetes mellitus), type 2, uncontrolled with complications [facility])   [place], Vernia Buff, NP   1 year ago Need for influenza vaccination   [place], RPH-CPP   1 year ago Encounter for annual physical exam   [place], [place] W, NP   1 year ago Diabetes mellitus type 2 in nonobese [facility]   [place], [place], [place]   2 years ago Diabetes mellitus type 2 in nonobese [facility])   [place], [place], [place]       Future Appointments             In 1 month Gildardo Pounds, NP [place] in normal range    Pulse Readings from Last 1 Encounters:  04/30/19 78            amLODipine (NORVASC) 10 MG tablet 30 tablet 0    Sig: Take 1 tablet (10 mg total) by mouth daily.      Cardiovascular:  Calcium Channel Blockers Failed - 10/27/2020  4:45 PM      Failed - Last BP in normal range    BP Readings from Last 1 Encounters:  04/30/19 (!) 143/94          Failed - Valid encounter within last 6 months    Recent Outpatient Visits           1 year ago DM (diabetes mellitus), type 2, uncontrolled with complications [facility])   [place], Vernia Buff, NP   1 year ago Need for influenza vaccination   [place], RPH-CPP   1 year ago Encounter for annual physical exam   [place], Vernia Buff, NP   1 year ago Diabetes mellitus type 2 in nonobese [facility])   [place], [place], [place]   2 years ago Diabetes mellitus type 2 in nonobese [facility])   [place], Dion[place], [place]       Future Appointments             In 1 month Gildardo Pounds, NP [place]

## 2020-12-24 ENCOUNTER — Ambulatory Visit: Payer: 59 | Attending: Nurse Practitioner | Admitting: Nurse Practitioner

## 2020-12-24 ENCOUNTER — Other Ambulatory Visit: Payer: Self-pay

## 2020-12-24 ENCOUNTER — Encounter: Payer: Self-pay | Admitting: Nurse Practitioner

## 2020-12-24 VITALS — BP 158/108 | HR 79 | Resp 20 | Wt 209.0 lb

## 2020-12-24 DIAGNOSIS — E785 Hyperlipidemia, unspecified: Secondary | ICD-10-CM | POA: Diagnosis not present

## 2020-12-24 DIAGNOSIS — N521 Erectile dysfunction due to diseases classified elsewhere: Secondary | ICD-10-CM

## 2020-12-24 DIAGNOSIS — I6930 Unspecified sequelae of cerebral infarction: Secondary | ICD-10-CM | POA: Diagnosis not present

## 2020-12-24 DIAGNOSIS — IMO0002 Reserved for concepts with insufficient information to code with codable children: Secondary | ICD-10-CM

## 2020-12-24 DIAGNOSIS — E1159 Type 2 diabetes mellitus with other circulatory complications: Secondary | ICD-10-CM

## 2020-12-24 DIAGNOSIS — I10 Essential (primary) hypertension: Secondary | ICD-10-CM

## 2020-12-24 DIAGNOSIS — E1165 Type 2 diabetes mellitus with hyperglycemia: Secondary | ICD-10-CM

## 2020-12-24 LAB — GLUCOSE, POCT (MANUAL RESULT ENTRY): POC Glucose: 167 mg/dl — AB (ref 70–99)

## 2020-12-24 MED ORDER — TRUEPLUS LANCETS 28G MISC: 3 refills | Status: DC

## 2020-12-24 MED ORDER — SILDENAFIL CITRATE 100 MG PO TABS: 50.0000 mg | ORAL_TABLET | Freq: Every day | ORAL | 11 refills | Status: DC | PRN

## 2020-12-24 MED ORDER — LISINOPRIL-HYDROCHLOROTHIAZIDE 20-25 MG PO TABS: 1.0000 | ORAL_TABLET | Freq: Every day | ORAL | 3 refills | Status: DC

## 2020-12-24 MED ORDER — AMLODIPINE BESYLATE 10 MG PO TABS: 1.0000 | ORAL_TABLET | Freq: Every day | ORAL | 1 refills | Status: DC

## 2020-12-24 MED ORDER — CLOPIDOGREL BISULFATE 75 MG PO TABS: 75.0000 mg | ORAL_TABLET | Freq: Every day | ORAL | 1 refills | Status: DC

## 2020-12-24 MED ORDER — ATENOLOL 25 MG PO TABS
25.0000 mg | ORAL_TABLET | Freq: Every day | ORAL | 1 refills | Status: DC
Start: 2020-12-24 — End: 2021-06-25

## 2020-12-24 MED ORDER — DAPAGLIFLOZIN PROPANEDIOL 10 MG PO TABS
10.0000 mg | ORAL_TABLET | Freq: Every day | ORAL | 0 refills | Status: AC
Start: 2020-12-24 — End: 2021-03-24

## 2020-12-24 MED ORDER — ATORVASTATIN CALCIUM 20 MG PO TABS
20.0000 mg | ORAL_TABLET | Freq: Every day | ORAL | 1 refills | Status: DC
Start: 2020-12-24 — End: 2021-01-18

## 2020-12-24 MED ORDER — ASPIRIN 81 MG PO TBEC: 81.0000 mg | DELAYED_RELEASE_TABLET | Freq: Every day | ORAL | 1 refills | Status: DC

## 2020-12-24 MED ORDER — TRUE METRIX BLOOD GLUCOSE TEST VI STRP: ORAL_STRIP | 12 refills | Status: DC

## 2020-12-24 MED ORDER — METFORMIN HCL 500 MG PO TABS: ORAL_TABLET | ORAL | 1 refills | Status: DC

## 2020-12-24 NOTE — Progress Notes (Signed)
Medication RF F/u DM

## 2020-12-24 NOTE — Progress Notes (Signed)
Assessment & Plan:  Zachary [name] was seen today for follow-up and diabetes.  Diagnoses and all orders for this visit:  DM (diabetes mellitus), type 2, uncontrolled with complications ([name]) -     HgB A1c -     Glucose (CBG) -     dapagliflozin propanediol (FARXIGA) 10 MG TABS tablet; Take 1 tablet (10 mg total) by mouth daily before breakfast. -     glucose blood (TRUE METRIX BLOOD GLUCOSE TEST) test strip; Use as instructed -     metFORMIN (GLUCOPHAGE) 500 MG tablet; Take 2 with breakfast and 1 with dinner -     TRUEplus Lancets 28G MISC; Use as instructed. Check blood glucose level by fingerstick twice per day.  Essential hypertension -     amLODipine (NORVASC) 10 MG tablet; Take 1 tablet (10 mg total) by mouth daily. -     atenolol (TENORMIN) 25 MG tablet; Take 1 tablet (25 mg total) by mouth daily. -     lisinopril-hydrochlorothiazide (ZESTORETIC) 20-25 MG tablet; Take 1 tablet by mouth daily. Continue all antihypertensives as prescribed.  Remember to bring in your blood pressure log with you for your follow up appointment.  DASH/Mediterranean Diets are healthier choices for HTN.   History of CVA with residual deficit -     aspirin 81 MG EC tablet; Take 1 tablet (81 mg total) by mouth at bedtime. -     atorvastatin (LIPITOR) 20 MG tablet; Take 1 tablet (20 mg total) by mouth daily at 6 PM. -     clopidogrel (PLAVIX) 75 MG tablet; Take 1 tablet (75 mg total) by mouth daily.  Hyperlipidemia LDL goal <70 -     atorvastatin (LIPITOR) 20 MG tablet; Take 1 tablet (20 mg total) by mouth daily at 6 PM. INSTRUCTIONS: Work on a low fat, heart healthy diet and participate in regular aerobic exercise program by working out at least 150 minutes per week; 5 days a week-30 minutes per day. Avoid red meat/beef/steak,  fried foods. junk foods, sodas, sugary drinks, unhealthy snacking, alcohol and smoking.  Drink at least 80 oz of water per day and monitor your carbohydrate intake daily.   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    Patient has been counseled on age-appropriate routine health concerns for screening and prevention. These are reviewed and up-to-date. Referrals have been placed accordingly. Immunizations are up-to-date or declined.    Subjective:   Chief Complaint  Patient presents with  . Follow-up  . Diabetes   HPI Zachary [name] 50 y.o. male presents to office today for follow up.  He has a past medical history of Diabetes mellitus without complication ([place]), Hypertension, and Stroke ([place]) (05/2017).    DM2 Poorly controlled. Will add farxiga 10 mg daily today. He will continue on metformin 748m daily. LDL at goal with atorvastatin 20 mg daily. Unfortunately the A1c was obtained but not entered into the system today. It was greater than 8   Essential Hypertension Poorly controlled. He has been out of his medications for quite some time. Will restart amlodipine 10 mg daily, lisinopril-hctz 20-25 mg daily and atenolol 25 mg daily. Will recheck blood pressure in a few weeks. If still elevated will need to likely add clonidine or switch atenolol to carvedilol. Denies chest pain, shortness of breath, palpitations, lightheadedness, dizziness, headaches or  BLE edema.  BP Readings from Last 3 Encounters:  12/24/20 (!) 158/108  04/30/19 (!) 143/94  07/25/18 (!) 144/87    Erectile dysfunction Difficulty maintaining and achieving an erection.  Review of Systems  Constitutional: Negative for fever, malaise/fatigue and weight loss.  HENT: Negative.  Negative for nosebleeds.   Eyes: Negative.  Negative for blurred vision, double vision and photophobia.  Respiratory: Negative.  Negative for cough and shortness of breath.   Cardiovascular: Negative.  Negative for chest pain, palpitations and leg swelling.   Gastrointestinal: Negative.  Negative for heartburn, nausea and vomiting.  Genitourinary:       ED  Musculoskeletal: Negative.  Negative for myalgias.  Neurological: Positive for weakness. Negative for dizziness, focal weakness, seizures and headaches.  Psychiatric/Behavioral: Negative.  Negative for suicidal ideas.    Past Medical History:  Diagnosis Date  . Diabetes mellitus without complication ([place])   . Hypertension   . Stroke [facility]) 05/2017   Right [place] Stroke    History reviewed. No pertinent surgical history.  Family History  Problem Relation Age of Onset  . Hypertension Mother     Social History Reviewed with no changes to be made today.   Outpatient Medications Prior to Visit  Medication Sig Dispense Refill  . amLODipine (NORVASC) 10 MG tablet TAKE 1 TABLET BY MOUTH EVERY DAY 30 tablet 0  . aspirin 81 MG EC tablet Take 1 tablet (81 mg total) by mouth at bedtime. 90 tablet 1  . atorvastatin (LIPITOR) 20 MG tablet Take 1 tablet (20 mg total) by mouth daily at 6 PM. 90 tablet 1  . clopidogrel (PLAVIX) 75 MG tablet Take 1 tablet (75 mg total) by mouth daily. 90 tablet 1  . lisinopril-hydrochlorothiazide (ZESTORETIC) 20-25 MG tablet Take 1 tablet by mouth daily. 90 tablet 3  . metFORMIN (GLUCOPHAGE) 500 MG tablet Take 2 with breakfast and 1 with dinner 270 tablet 1  . sildenafil (VIAGRA) 100 MG tablet Take 0.5-1 tablets (50-100 mg total) by mouth daily as needed for erectile dysfunction. 5 tablet 11  . BD ULTRA-FINE LANCETS lancets Use as instructed 100 each 12  . blood glucose meter kit and supplies KIT Dispense based on patient and insurance preference. Use up to four times daily as directed. (FOR ICD-9 250.00, 250.01). 1 each 0  . Blood Glucose Monitoring Suppl (TRUE METRIX METER) w/Device KIT 1 each by Does not apply route 3 (three) times daily. 1 kit 0  . atenolol (TENORMIN) 25 MG tablet Take 1 tablet (25 mg total) by mouth daily. 90 tablet 1  . glucose blood (TRUE  METRIX BLOOD GLUCOSE TEST) test strip Use as instructed 100 each 12   No facility-administered medications prior to visit.    No Known Allergies     Objective:    BP (!) 158/108   Pulse 79   Resp 20   Wt 209 lb (94.8 kg)   SpO2 97%   BMI 29.99 kg/m  Wt Readings from Last 3 Encounters:  12/24/20 209 lb (94.8 kg)  04/30/19 220 lb (99.8 kg)  07/25/18 226 lb 6.4 oz (102.7 kg)    Physical Exam Vitals and nursing note reviewed.  Constitutional:      Appearance: He is well-developed.  HENT:     Head: Normocephalic and atraumatic.  Cardiovascular:     Rate and Rhythm: Normal rate and regular rhythm.     Heart sounds: Normal heart sounds. No murmur heard. No friction rub. No gallop.   Pulmonary:  Effort: Pulmonary effort is normal. No tachypnea or respiratory distress.     Breath sounds: Normal breath sounds. No decreased breath sounds, wheezing, rhonchi or rales.  Chest:     Chest wall: No tenderness.  Abdominal:     General: Bowel sounds are normal.     Palpations: Abdomen is soft.  Musculoskeletal:        General: Normal range of motion.     Cervical back: Normal range of motion.  Skin:    General: Skin is warm and dry.  Neurological:     Mental Status: He is alert and oriented to person, place, and time.     Coordination: Coordination normal.  Psychiatric:        Behavior: Behavior normal. Behavior is cooperative.        Thought Content: Thought content normal.        Judgment: Judgment normal.          Patient has been counseled extensively about nutrition and exercise as well as the importance of adherence with medications and regular follow-up. The patient was given clear instructions to go to ER or return to medical center if symptoms don't improve, worsen or new problems develop. The patient verbalized understanding.   Follow-up: Return for 3 weeks BP check with Zachary [name]. See me in 3 months.   Zachary Pounds, FNP-BC [facility] and  [place], [place]   12/31/2020, 9:58 PM

## 2020-12-31 ENCOUNTER — Encounter: Payer: Self-pay | Admitting: Nurse Practitioner

## 2021-01-01 ENCOUNTER — Encounter: Payer: Self-pay | Admitting: Nurse Practitioner

## 2021-01-01 ENCOUNTER — Other Ambulatory Visit: Payer: Self-pay | Admitting: Nurse Practitioner

## 2021-01-18 ENCOUNTER — Other Ambulatory Visit: Payer: Self-pay

## 2021-01-18 ENCOUNTER — Ambulatory Visit: Payer: 59 | Attending: Nurse Practitioner | Admitting: Pharmacist

## 2021-01-18 ENCOUNTER — Encounter: Payer: Self-pay | Admitting: Pharmacist

## 2021-01-18 VITALS — BP 133/90

## 2021-01-18 DIAGNOSIS — E1165 Type 2 diabetes mellitus with hyperglycemia: Secondary | ICD-10-CM | POA: Diagnosis not present

## 2021-01-18 DIAGNOSIS — IMO0002 Reserved for concepts with insufficient information to code with codable children: Secondary | ICD-10-CM

## 2021-01-18 DIAGNOSIS — E118 Type 2 diabetes mellitus with unspecified complications: Secondary | ICD-10-CM

## 2021-01-18 LAB — POCT GLYCOSYLATED HEMOGLOBIN (HGB A1C): Hemoglobin A1C: 7.9 % — AB (ref 4.0–5.6)

## 2021-01-18 MED ORDER — ATORVASTATIN CALCIUM 40 MG PO TABS: 40.0000 mg | ORAL_TABLET | Freq: Every day | ORAL | 0 refills | Status: DC

## 2021-01-18 MED ORDER — METFORMIN HCL 500 MG PO TABS: 1000.0000 mg | ORAL_TABLET | Freq: Two times a day (BID) | ORAL | 0 refills | Status: DC

## 2021-01-18 NOTE — Progress Notes (Signed)
S:    PCP: Zelda   No chief complaint on file.  Patient arrives in good spirits.  Presents for diabetes evaluation, education, and management. Patient was referred and last seen by Primary Care Provider on 12/24/2020.   Patient reports Diabetes is longstanding. Denies hx of pancreatitis. Denies OP use of insulin. Has only ever been on metformin as his insurance will not cover [place]. Insurance requires step therapy with optimization of metformin before they will cover [place]. Of note, Jardiance nor Invokana on formulary. GLP-1 RA medications require step therapy with metformin. Pt does have a hx of stroke. No ACS/CAD, CHF, or CKD.   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   Current diabetes medications include: dapagliflozin 10 mg daily Current hypertension medications include: amlodipine 10 mg daily, atenolol 25 mg daily, lisinopril-HCTZ 20-25 mg daily   Current hyperlipidemia medications include: atorvastatin 20 mg daily   Patient denies hypoglycemic events.  Patient reported dietary habits:  - Pt admits to recent dietary indiscretion, however, he is working to improve this   Patient-reported exercise habits: every morning    Patient denies nocturia (nighttime urination).  Patient denies neuropathy (nerve pain). Patient denies visual changes. Patient reports self foot exams.     O:  Physical Exam  ROS  Lab Results  Component Value Date   HGBA1C 7.9 (A) 01/18/2021   Vitals:   01/18/21 0929  BP: 133/90    Lipid Panel     Component Value Date/Time   CHOL 134 09/03/2019 0854   TRIG 134 09/03/2019 0854   HDL 38 (L) 09/03/2019 0854   CHOLHDL 3.5 09/03/2019 0854   CHOLHDL 3.3 03/19/2017 0211   VLDL 13 03/19/2017 0211   LDLCALC 72 09/03/2019 0854   Home fasting blood sugars: 150s. Denies any >200.    Clinical  Atherosclerotic Cardiovascular Disease (ASCVD): Yes  The ASCVD Risk score Mikey Bussing DC Jr., et al., 2013) failed to calculate for the following reasons:   The patient has a prior MI or stroke diagnosis   A/P: Diabetes longstanding currently uncontrolled but close to goal. Patient is able to verbalize appropriate hypoglycemia management plan. Medication adherence appears appropriate, but we are limited due to insurance coverage. SGLT-2i or GLP-1 RA medications are possible, however, insurance requires PA proving that the patient is optimized on metformin before adding these. He would benefit from one of these classes given his ASCVD history irregardless of A1c according to most recent guidelines. He does not recall ever taking 1000 mg BID of metformin and his renal function is not contraindicative to use. Will optimize metformin for now and have him return in 1 month for re-eval.  -Continued metformin. Will have pt increase to 1000 mg BID. -Extensively discussed pathophysiology of diabetes, recommended lifestyle interventions, dietary effects on blood sugar control -Counseled on s/sx of and management of hypoglycemia -Next A1C anticipated 04/2021.  -POCT A1c -CMP14+eGFR -Urine microalbumin:SCr ratio   ASCVD risk - secondary prevention in patient with diabetes. Last LDL is not controlled. According to more stringent AACE 2020 guidelines, he will benefit from an LDL goal of <55 given his stroke and DM. He is tolerating moderate intensity statin therapy well. Will increase to high intensity statin as indicated.  -Increased dose of atorvastatin to 40 mg daily.  -Lipid  HTN - longstanding currently above goal but improving. BP goal <130/80 mmHg. Pt is adherent to medications. Prior to  last PCP appointment, pt was not taking medications consistently. Will hold off on changes and reassess in 1 month.  -Continue current regimen.   Written patient instructions provided.  Total time in face to face counseling  30 minutes.   Follow up Pharmacist Clinic Visit in 1 month.   Benard Halsted, PharmD, [place], [place] [phone]

## 2021-01-19 ENCOUNTER — Other Ambulatory Visit: Payer: Self-pay | Admitting: Nurse Practitioner

## 2021-01-19 DIAGNOSIS — R7989 Other specified abnormal findings of blood chemistry: Secondary | ICD-10-CM

## 2021-01-19 LAB — CMP14+EGFR
ALT: 14 IU/L (ref 0–44)
AST: 13 IU/L (ref 0–40)
Albumin/Globulin Ratio: 1.4 (ref 1.2–2.2)
Albumin: 4.6 g/dL (ref 3.8–4.9)
Alkaline Phosphatase: 103 IU/L (ref 44–121)
BUN/Creatinine Ratio: 14 (ref 9–20)
BUN: 21 mg/dL (ref 6–24)
Bilirubin Total: 0.3 mg/dL (ref 0.0–1.2)
CO2: 23 mmol/L (ref 20–29)
Calcium: 10.4 mg/dL — ABNORMAL HIGH (ref 8.7–10.2)
Chloride: 97 mmol/L (ref 96–106)
Creatinine, Ser: 1.55 mg/dL — ABNORMAL HIGH (ref 0.76–1.27)
Globulin, Total: 3.2 g/dL (ref 1.5–4.5)
Glucose: 150 mg/dL — ABNORMAL HIGH (ref 65–99)
Potassium: 4.3 mmol/L (ref 3.5–5.2)
Sodium: 139 mmol/L (ref 134–144)
Total Protein: 7.8 g/dL (ref 6.0–8.5)
eGFR: 54 mL/min/{1.73_m2} — ABNORMAL LOW (ref >59)

## 2021-01-19 LAB — LIPID PANEL
Chol/HDL Ratio: 4.1 ratio (ref 0.0–5.0)
Cholesterol, Total: 206 mg/dL — ABNORMAL HIGH (ref 100–199)
HDL: 50 mg/dL (ref >39)
LDL Chol Calc (NIH): 127 mg/dL — ABNORMAL HIGH (ref 0–99)
Triglycerides: 165 mg/dL — ABNORMAL HIGH (ref 0–149)
VLDL Cholesterol Cal: 29 mg/dL (ref 5–40)

## 2021-01-19 LAB — MICROALBUMIN / CREATININE URINE RATIO
Creatinine, Urine: 130.8 mg/dL
Microalb/Creat Ratio: 4 mg/g creat (ref 0–29)
Microalbumin, Urine: 5.4 ug/mL

## 2021-02-16 ENCOUNTER — Ambulatory Visit: Payer: 59 | Attending: Nurse Practitioner | Admitting: Pharmacist

## 2021-02-16 ENCOUNTER — Encounter: Payer: Self-pay | Admitting: Pharmacist

## 2021-02-16 ENCOUNTER — Other Ambulatory Visit: Payer: Self-pay

## 2021-02-16 DIAGNOSIS — E118 Type 2 diabetes mellitus with unspecified complications: Secondary | ICD-10-CM | POA: Diagnosis not present

## 2021-02-16 DIAGNOSIS — R7989 Other specified abnormal findings of blood chemistry: Secondary | ICD-10-CM

## 2021-02-16 DIAGNOSIS — IMO0002 Reserved for concepts with insufficient information to code with codable children: Secondary | ICD-10-CM

## 2021-02-16 DIAGNOSIS — E1165 Type 2 diabetes mellitus with hyperglycemia: Secondary | ICD-10-CM

## 2021-02-16 LAB — GLUCOSE, POCT (MANUAL RESULT ENTRY): POC Glucose: 140 mg/dl — AB (ref 70–99)

## 2021-02-16 NOTE — Progress Notes (Signed)
S:    PCP: Zelda   No chief complaint on file.  Patient arrives in good spirits.  Presents for diabetes evaluation, education, and management. Patient was referred and last seen by Primary Care Provider on 12/24/2020. I saw him on 01/18/2021. At that visit, we increased his metformin to 1000 mg BID. WE also increased his atorvastatin to 40 mg daily. His labs at that visit revealed renal decline.    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   Current diabetes medications include: metformin 1000 mg BID Current hypertension medications include: amlodipine 10 mg daily, atenolol 25 mg daily, lisinopril-HCTZ 20-25 mg daily   Current hyperlipidemia medications include: atorvastatin 40 mg daily   Patient denies hypoglycemic events.  Patient reported dietary habits:  - Pt endorses improvements made to his diet.  - He is eliminating carbs and sweets.   Patient-reported exercise habits:  -Exercises every morning. Specific time spent not given.    Patient denies nocturia (nighttime urination).  Patient denies neuropathy (nerve pain). Patient denies visual changes. Patient reports self foot exams.     O:  POCT: 140  Lab Results  Component Value Date   HGBA1C 7.9 (A) 01/18/2021   There were no vitals filed for this visit.   Lipid Panel     Component Value Date/Time   CHOL 206 (H) 01/18/2021 0935   TRIG 165 (H) 01/18/2021 0935   HDL 50 01/18/2021 0935   CHOLHDL 4.1 01/18/2021 0935   CHOLHDL 3.3 03/19/2017 0211   VLDL 13 03/19/2017 0211   LDLCALC 127 (H) 01/18/2021 0935   Home fasting blood sugars: has not checked much since last appt with me. Just obtained a new glucometer.     Clinical Atherosclerotic Cardiovascular Disease (ASCVD): Yes  The ASCVD Risk score Mikey Bussing DC Jr., et al., 2013) failed to calculate for the following reasons:    The patient has a prior MI or stroke diagnosis   A/P: Diabetes longstanding currently uncontrolled but close to goal. Patient is able to verbalize appropriate hypoglycemia management plan. Medication adherence appears appropriate. Renal function showed slight decline at last visit. Rechecking today. May need to decrease metformin based on results. I think at this point we could initiate a PA for either Farxiga or a GLP-1 RA. Will defer to PCP at next month's follow-up. -Continued metformin 1000 mg BID -Extensively discussed pathophysiology of diabetes, recommended lifestyle interventions, dietary effects on blood sugar control -Counseled on s/sx of and management of hypoglycemia -Next A1C anticipated 04/2021.  -CMP14+eGFR  ASCVD risk - secondary prevention in patient with diabetes. Last LDL is not controlled. According to more stringent AACE 2020 guidelines, he will benefit from an LDL goal of <55 given his stroke and DM. Recently increased his atorvastatin to 40 mg daily after lipid results last visit showed an LDL of 127. Continue atorvastatin 40 mg daily for now. -Atorvastatin 40 mg daily.   Written patient instructions provided.  Total time in face to face counseling 30 minutes.   Follow up PCP Clinic Visit in 1 month.   Benard Halsted, PharmD, [place], [place] [phone]

## 2021-02-17 LAB — CMP14+EGFR
ALT: 11 IU/L (ref 0–44)
AST: 16 IU/L (ref 0–40)
Albumin/Globulin Ratio: 1.6 (ref 1.2–2.2)
Albumin: 4.5 g/dL (ref 3.8–4.9)
Alkaline Phosphatase: 71 IU/L (ref 44–121)
BUN/Creatinine Ratio: 15 (ref 9–20)
BUN: 20 mg/dL (ref 6–24)
Bilirubin Total: 0.4 mg/dL (ref 0.0–1.2)
CO2: 23 mmol/L (ref 20–29)
Calcium: 9.8 mg/dL (ref 8.7–10.2)
Chloride: 97 mmol/L (ref 96–106)
Creatinine, Ser: 1.37 mg/dL — ABNORMAL HIGH (ref 0.76–1.27)
Globulin, Total: 2.8 g/dL (ref 1.5–4.5)
Glucose: 125 mg/dL — ABNORMAL HIGH (ref 65–99)
Potassium: 4.3 mmol/L (ref 3.5–5.2)
Sodium: 137 mmol/L (ref 134–144)
Total Protein: 7.3 g/dL (ref 6.0–8.5)
eGFR: 62 mL/min/{1.73_m2} (ref >59)

## 2021-03-29 ENCOUNTER — Ambulatory Visit (HOSPITAL_BASED_OUTPATIENT_CLINIC_OR_DEPARTMENT_OTHER): Payer: 59 | Admitting: Nurse Practitioner

## 2021-03-29 ENCOUNTER — Other Ambulatory Visit: Payer: Self-pay

## 2021-03-29 ENCOUNTER — Encounter: Payer: Self-pay | Admitting: Nurse Practitioner

## 2021-03-29 DIAGNOSIS — E785 Hyperlipidemia, unspecified: Secondary | ICD-10-CM

## 2021-03-29 DIAGNOSIS — I10 Essential (primary) hypertension: Secondary | ICD-10-CM

## 2021-03-29 DIAGNOSIS — E1159 Type 2 diabetes mellitus with other circulatory complications: Secondary | ICD-10-CM | POA: Diagnosis not present

## 2021-03-29 DIAGNOSIS — I6930 Unspecified sequelae of cerebral infarction: Secondary | ICD-10-CM | POA: Diagnosis not present

## 2021-03-29 DIAGNOSIS — N521 Erectile dysfunction due to diseases classified elsewhere: Secondary | ICD-10-CM

## 2021-03-29 MED ORDER — SILDENAFIL CITRATE 100 MG PO TABS: 50.0000 mg | ORAL_TABLET | Freq: Every day | ORAL | 11 refills | Status: DC | PRN

## 2021-03-29 MED ORDER — CLOPIDOGREL BISULFATE 75 MG PO TABS: 75.0000 mg | ORAL_TABLET | Freq: Every day | ORAL | 3 refills | Status: DC

## 2021-03-29 NOTE — Progress Notes (Signed)
Virtual Visit via Telephone Note Due to national recommendations of social distancing due to [place] 19, telehealth visit is felt to be most appropriate for this patient at this time.  I discussed the limitations, risks, security and privacy concerns of performing an evaluation and management service by telephone and the availability of in person appointments. I also discussed with the patient that there may be a patient responsible charge related to this service. The patient expressed understanding and agreed to proceed.    I connected with Zachary [name] on 03/29/21  at   8:30 AM EDT  EDT by telephone and verified that I am speaking with the correct person using two identifiers.  Location of Patient: Private Residence   Location of Provider: [place] [facility] [place] participating in Telemedicine visit: [name] FNP-BC [place]    History of Present Illness: Telemedicine visit for: DM and HTN He has a past medical history of DM2, Hypertension, and Stroke(05/2017).    DM Type 2 Not well controlled. He is currently taking metformin 1000 mg BID. This morning's fasting reading: 131 fasting. Last night's postprandial reading 152. Denies any symptoms of hypo or hyperglycemia. Cholesterol levels not at goal. He endorses adherence taking atorvastatin 40 mg daily. Denies statin intolerance.  Lab Results  Component Value Date   HGBA1C 7.9 (A) 01/18/2021   Lab Results  Component Value Date   LDLCALC 127 (H) 01/18/2021     HTN Blood pressure has not been optimal. He does not have a monitoring device at home. Taking atenolol 25 mg daily, amlodipine 10 mg daily, lisinopril-hctz 20-25 mg daily. Denies chest pain, shortness of breath, palpitations, lightheadedness, dizziness, headaches or BLE edema.   BP Readings from Last 3 Encounters:  01/18/21 133/90  12/24/20 (!) 158/108  04/30/19 (!) 143/94    Past Medical History:  Diagnosis Date   Diabetes  mellitus without complication ([place])    Hypertension    Stroke ([place]) 05/2017   Right [place] Stroke    No past surgical history on file.  Family History  Problem Relation Age of Onset   Hypertension Mother     Social History   Socioeconomic History   Marital status: Married    Spouse name: Not on file   Number of children: Not on file   Years of education: Not on file   Highest education level: Not on file  Occupational History   Not on file  Tobacco Use   Smoking status: Never   Smokeless tobacco: Never  Vaping Use   Vaping Use: Never used  Substance and Sexual Activity   Alcohol use: Not Currently    Comment: occ   Drug use: No   Sexual activity: Yes  Other Topics Concern   Not on file  Social History Narrative   Not on file   Social Determinants of Health   Financial Resource Strain: Not on file  Food Insecurity: Not on file  Transportation Needs: Not on file  Physical Activity: Not on file  Stress: Not on file  Social Connections: Not on file     Observations/Objective: Awake, alert and oriented x 3   Review of Systems  Constitutional:  Negative for fever, malaise/fatigue and weight loss.  HENT: Negative.  Negative for nosebleeds.   Eyes: Negative.  Negative for blurred vision, double vision and photophobia.  Respiratory: Negative.  Negative for cough and shortness of breath.   Cardiovascular: Negative.  Negative for chest pain, palpitations and  leg swelling.  Gastrointestinal: Negative.  Negative for heartburn, nausea and vomiting.  Musculoskeletal: Negative.  Negative for myalgias.  Neurological: Negative.  Negative for dizziness, focal weakness, seizures and headaches.  Psychiatric/Behavioral: Negative.  Negative for suicidal ideas.    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 Continue blood sugar control as discussed in office today, low carbohydrate diet, and regular physical exercise as tolerated, 150 minutes per week (30 min each day, 5 days per week, or 50 min 3 days per week). Keep blood sugar logs with fasting goal of 90-130 mg/dl, post prandial (after you eat) less than 180.  For Hypoglycemia: BS <60 and Hyperglycemia BS >400; contact the clinic ASAP. Annual eye exams and foot exams are recommended.    History of CVA with residual deficit -     clopidogrel (PLAVIX) 75 MG tablet; Take 1 tablet (75 mg total) by mouth daily.  Dyslipidemia, goal LDL below 70  INSTRUCTIONS: Work on a low fat, heart healthy diet and participate in regular aerobic exercise program by working out at least 150 minutes per week; 5 days a week-30 minutes per day. Avoid red meat/beef/steak,  fried foods. junk foods, sodas, sugary drinks, unhealthy snacking, alcohol and smoking.  Drink at least 80 oz of water per day and monitor your carbohydrate intake daily.    Primary HTN Continue all antihypertensives as prescribed.  Remember to bring in your blood pressure log with you for your follow up appointment.  DASH/Mediterranean Diets are healthier choices for HTN.    Follow Up Instructions Return in about 6 weeks (around 05/10/2021) for HTN.     I discussed the assessment and treatment plan with the patient. The patient was provided an opportunity to ask questions and all were answered. The patient agreed with the plan and demonstrated an understanding of the instructions.   The patient was advised to call back or seek an in-person evaluation if the symptoms worsen or if the condition fails to improve as anticipated.  I provided 12 minutes of non-face-to-face time during this encounter including median intraservice time, reviewing previous notes, labs, imaging, medications and explaining diagnosis and  management.  Gildardo Pounds, FNP-BC

## 2021-04-12 ENCOUNTER — Other Ambulatory Visit: Payer: Self-pay | Admitting: Family Medicine

## 2021-04-12 DIAGNOSIS — IMO0002 Reserved for concepts with insufficient information to code with codable children: Secondary | ICD-10-CM

## 2021-04-12 DIAGNOSIS — E1165 Type 2 diabetes mellitus with hyperglycemia: Secondary | ICD-10-CM

## 2021-04-12 NOTE — Telephone Encounter (Signed)
See OV notes on 03/29/21. Patient to keep blood sugar controlled as discussed during OV. Refill provided.

## 2021-04-16 ENCOUNTER — Other Ambulatory Visit: Payer: Self-pay | Admitting: Family Medicine

## 2021-04-16 NOTE — Telephone Encounter (Signed)
Requested medication (s) are due for refill today: Yes  Requested medication (s) are on the active medication list: Yes  Last refill:  01/18/21  Future visit scheduled: No  Notes to clinic:  Protocol indicates pt. Needs lab work.    Requested Prescriptions  Pending Prescriptions Disp Refills   atorvastatin (LIPITOR) 40 MG tablet [Pharmacy Med Name: ATORVASTATIN 40 MG TABLET] 90 tablet 0    Sig: TAKE 1 TABLET BY MOUTH EVERY DAY     Cardiovascular:  Antilipid - Statins Failed - 04/16/2021  1:47 AM      Failed - Total Cholesterol in normal range and within 360 days    Cholesterol, Total  Date Value Ref Range Status  01/18/2021 206 (H) 100 - 199 mg/dL Final          Failed - LDL in normal range and within 360 days    LDL Chol Calc (NIH)  Date Value Ref Range Status  01/18/2021 127 (H) 0 - 99 mg/dL Final          Failed - Triglycerides in normal range and within 360 days    Triglycerides  Date Value Ref Range Status  01/18/2021 165 (H) 0 - 149 mg/dL Final          Passed - HDL in normal range and within 360 days    HDL  Date Value Ref Range Status  01/18/2021 50 >39 mg/dL Final          Passed - Patient is not pregnant      Passed - Valid encounter within last 12 months    Recent Outpatient Visits           2 weeks ago Primary hypertension   [place], [place] W, NP   1 month ago DM (diabetes mellitus), type 2, uncontrolled with complications [facility])   [place], Annie Main L, RPH-CPP   2 months ago DM (diabetes mellitus), type 2, uncontrolled with complications [facility])   [place], Annie Main L, RPH-CPP   3 months ago DM (diabetes mellitus), type 2, uncontrolled with complications [facility])   [place], [place] W, NP   1 year ago DM (diabetes mellitus), type 2, uncontrolled with complications [facility])   [place], Vernia Buff, NP

## 2021-06-24 ENCOUNTER — Other Ambulatory Visit: Payer: Self-pay | Admitting: Nurse Practitioner

## 2021-06-24 DIAGNOSIS — I10 Essential (primary) hypertension: Secondary | ICD-10-CM

## 2021-06-24 NOTE — Telephone Encounter (Signed)
Requested medications are due for refill today.  yes  Requested medications are on the active medications list.  yes  Last refill. 12/24/2020 for both  Future visit scheduled.   no  Notes to clinic.  Both rx's expired 03/24/2021.

## 2023-02-13 DIAGNOSIS — R1110 Vomiting, unspecified: Secondary | ICD-10-CM | POA: Insufficient documentation

## 2023-08-03 ENCOUNTER — Encounter: Payer: Self-pay | Admitting: Physician Assistant

## 2023-08-03 ENCOUNTER — Ambulatory Visit: Payer: Commercial Managed Care - PPO | Attending: Physician Assistant | Admitting: Physician Assistant

## 2023-08-03 ENCOUNTER — Other Ambulatory Visit: Payer: Self-pay

## 2023-08-03 VITALS — BP 117/79 | HR 72 | Ht 70.0 in

## 2023-08-03 DIAGNOSIS — K219 Gastro-esophageal reflux disease without esophagitis: Secondary | ICD-10-CM

## 2023-08-03 DIAGNOSIS — Z1211 Encounter for screening for malignant neoplasm of colon: Secondary | ICD-10-CM | POA: Diagnosis not present

## 2023-08-03 DIAGNOSIS — Z13220 Encounter for screening for lipoid disorders: Secondary | ICD-10-CM

## 2023-08-03 DIAGNOSIS — I6930 Unspecified sequelae of cerebral infarction: Secondary | ICD-10-CM

## 2023-08-03 DIAGNOSIS — Z7984 Long term (current) use of oral hypoglycemic drugs: Secondary | ICD-10-CM

## 2023-08-03 DIAGNOSIS — R1110 Vomiting, unspecified: Secondary | ICD-10-CM

## 2023-08-03 DIAGNOSIS — I10 Essential (primary) hypertension: Secondary | ICD-10-CM

## 2023-08-03 DIAGNOSIS — E1165 Type 2 diabetes mellitus with hyperglycemia: Secondary | ICD-10-CM

## 2023-08-03 LAB — POCT GLYCOSYLATED HEMOGLOBIN (HGB A1C): Hemoglobin A1C: 7.1 % — AB (ref 4.0–5.6)

## 2023-08-03 LAB — GLUCOSE, POCT (MANUAL RESULT ENTRY): POC Glucose: 102 mg/dL — AB (ref 70–99)

## 2023-08-03 MED ORDER — CLOPIDOGREL BISULFATE 75 MG PO TABS
75.0000 mg | ORAL_TABLET | Freq: Every day | ORAL | 1 refills | Status: AC
  Filled 2023-08-03: qty 90, 90d supply, fill #0
  Filled 2024-06-12: qty 90, 90d supply, fill #1

## 2023-08-03 MED ORDER — AMLODIPINE BESYLATE 10 MG PO TABS
10.0000 mg | ORAL_TABLET | Freq: Every day | ORAL | 1 refills | Status: AC
  Filled 2023-08-03: qty 90, 90d supply, fill #0
  Filled 2024-06-12: qty 90, 90d supply, fill #1

## 2023-08-03 MED ORDER — LISINOPRIL-HYDROCHLOROTHIAZIDE 20-25 MG PO TABS
1.0000 | ORAL_TABLET | Freq: Every day | ORAL | 1 refills | Status: DC
  Filled 2023-08-03: qty 90, 90d supply, fill #0

## 2023-08-03 MED ORDER — ATENOLOL 25 MG PO TABS
25.0000 mg | ORAL_TABLET | Freq: Every day | ORAL | 0 refills | Status: DC
  Filled 2023-08-03: qty 90, 90d supply, fill #0

## 2023-08-03 MED ORDER — METFORMIN HCL 500 MG PO TABS
1000.0000 mg | ORAL_TABLET | Freq: Two times a day (BID) | ORAL | 0 refills | Status: DC
  Filled 2023-08-03: qty 360, 90d supply, fill #0

## 2023-08-03 MED ORDER — OMEPRAZOLE 20 MG PO CPDR
20.0000 mg | DELAYED_RELEASE_CAPSULE | Freq: Every day | ORAL | 3 refills | Status: DC
  Filled 2023-08-03: qty 30, 30d supply, fill #0

## 2023-08-03 NOTE — Progress Notes (Signed)
Patient ID: Zachary [name], male   DOB: July 22, 1971, 52 y.o.   MRN: [id number]   Zachary [name], is a 52 y.o. male  XBJ:[id number]  HYQ:[id number]  DOB - 02/14/1971  Chief Complaint  Patient presents with   Medical Management of Chronic Issues       Subjective:   Zachary [name] is a 52 y.o. male here today to re-establish care.  He has been taking his meds intermittently to make them go further.  He has been working on exercise and diet to help with diabetes and htn.  He has not been going elsewhere for care.  He has been largely taking med a couple times a week.  He did take all BP meds the last several days in a row.    For the last few months he feels like his food isn't digesting properly.  Sometimes he regurgitates his food into his mouth or has to spit it out.  He has started eating smaller meals which has helped.  No abdominal pain.  No changes in BM.  No melena or hematochezia.  No nausea  No problems updated.  ALLERGIES: No Known Allergies  PAST MEDICAL HISTORY: Past Medical History:  Diagnosis Date   Diabetes mellitus without complication (HCC)    Hypertension    Stroke (HCC) 05/2017   Right [place] Stroke    MEDICATIONS AT HOME: Prior to Admission medications   Medication Sig Start Date End Date Taking? Authorizing Provider  omeprazole (PRILOSEC) 20 MG capsule Take 1 capsule (20 mg total) by mouth daily. 08/03/23  Yes Georgian Co M, PA-C  amLODipine (NORVASC) 10 MG tablet Take 1 tablet (10 mg total) by mouth daily. 08/03/23   Anders Simmonds, PA-C  aspirin 81 MG EC tablet Take 1 tablet (81 mg total) by mouth at bedtime. Patient not taking: Reported on 08/03/2023 12/24/20   Claiborne Rigg, NP  atenolol (TENORMIN) 25 MG tablet Take 1 tablet (25 mg total) by mouth daily. 08/03/23 11/01/23  Anders Simmonds, PA-C  atorvastatin (LIPITOR) 40 MG tablet TAKE 1 TABLET BY MOUTH EVERY DAY Patient not taking: Reported on 08/03/2023 04/16/21   Hoy Register, MD  BD ULTRA-FINE  LANCETS lancets Use as instructed Patient not taking: Reported on 08/03/2023 04/14/17   Vivianne Master, PA-C  blood glucose meter kit and supplies KIT Dispense based on patient and insurance preference. Use up to four times daily as directed. (FOR ICD-9 250.00, 250.01). Patient not taking: Reported on 08/03/2023 04/12/17   Ranelle Oyster, MD  Blood Glucose Monitoring Suppl (TRUE METRIX METER) w/Device KIT 1 each by Does not apply route 3 (three) times daily. Patient not taking: Reported on 08/03/2023 04/14/17   Vivianne Master, PA-C  clopidogrel (PLAVIX) 75 MG tablet Take 1 tablet (75 mg total) by mouth daily. 08/03/23   Anders Simmonds, PA-C  glucose blood (TRUE METRIX BLOOD GLUCOSE TEST) test strip Use as instructed Patient not taking: Reported on 08/03/2023 12/24/20   Claiborne Rigg, NP  lisinopril-hydrochlorothiazide (ZESTORETIC) 20-25 MG tablet Take 1 tablet by mouth daily. 08/03/23   Anders Simmonds, PA-C  metFORMIN (GLUCOPHAGE) 500 MG tablet TAKE 2 TABLETS (1,000 MG TOTAL) BY MOUTH 2 (TWO) TIMES DAILY WITH A MEAL. Patient not taking: Reported on 08/03/2023 04/12/21   Claiborne Rigg, NP  sildenafil (VIAGRA) 100 MG tablet Take 0.5-1 tablets (50-100 mg total) by mouth daily as needed for erectile dysfunction. Patient not taking: Reported on 08/03/2023 03/29/21   Claiborne Rigg,  NP  TRUEplus Lancets 28G MISC Use as instructed. Check blood glucose level by fingerstick twice per day. Patient not taking: Reported on 08/03/2023 12/24/20   Claiborne Rigg, NP    ROS: Neg HEENT Neg resp Neg cardiac Neg GI Neg GU Neg MS Neg psych Neg neuro  Objective:   Vitals:   08/03/23 0930  BP: 117/79  Pulse: 72  SpO2: 98%  Height: 5\' 10"  (1.778 m)   Exam General appearance : Awake, alert, not in any distress. Speech Clear. Not toxic looking HEENT: Atraumatic and Normocephalic Neck: Supple, no JVD. No cervical lymphadenopathy.  Chest: Good air entry bilaterally, CTAB.  No  rales/rhonchi/wheezing CVS: S1 S2 regular, no murmurs.  Abdomen: Bowel sounds present, Non tender and not distended with no gaurding, rigidity or rebound. Extremities: B/L Lower Ext shows no edema, both legs are warm to touch Neurology: Awake alert, and oriented X 3, CN II-XII intact, Non focal Skin: No Rash  Data Review Lab Results  Component Value Date   HGBA1C 7.1 (A) 08/03/2023   HGBA1C 7.9 (A) 01/18/2021   HGBA1C 7.5 (H) 09/03/2019    Assessment & Plan   1. Type 2 diabetes mellitus with hyperglycemia, without long-term current use of insulin (HCC) (Primary) Resume to full dose gradually on metformin - Glucose (CBG) - HgB A1c - Urine Albumin/Creatinine with ratio (send out) [LAB689] - Ambulatory referral to Ophthalmology  2. Essential hypertension controlled - atenolol (TENORMIN) 25 MG tablet; Take 1 tablet (25 mg total) by mouth daily.  Dispense: 90 tablet; Refill: 0 - amLODipine (NORVASC) 10 MG tablet; Take 1 tablet (10 mg total) by mouth daily.  Dispense: 90 tablet; Refill: 1 - lisinopril-hydrochlorothiazide (ZESTORETIC) 20-25 MG tablet; Take 1 tablet by mouth daily.  Dispense: 90 tablet; Refill: 1  3. History of CVA with residual deficit resume - clopidogrel (PLAVIX) 75 MG tablet; Take 1 tablet (75 mg total) by mouth daily.  Dispense: 90 tablet; Refill: 1  4. Screening for colon cancer - Ambulatory referral to Gastroenterology  5. Gastroesophageal reflux disease, unspecified whether esophagitis present - Ambulatory referral to Gastroenterology - CMP14+EGFR - CBC with Differential - Lipase - omeprazole (PRILOSEC) 20 MG capsule; Take 1 capsule (20 mg total) by mouth daily.  Dispense: 30 capsule; Refill: 3 - H. pylori breath test  6. Screening cholesterol level - Lipid Panel  7. Regurgitation of food - Ambulatory referral to Gastroenterology - Lipase - H. pylori breath test    Return in about 3 months (around 11/01/2023).  The patient was given clear  instructions to go to ER or return to medical center if symptoms don't improve, worsen or new problems develop. The patient verbalized understanding. The patient was told to call to get lab results if they haven't heard anything in the next week.      Georgian Co, PA-C [facility] and [facility] [place], [place] [phone]   08/03/2023, 10:29 AM

## 2023-08-03 NOTE — Patient Instructions (Signed)

## 2023-08-04 LAB — MICROALBUMIN / CREATININE URINE RATIO
Creatinine, Urine: 120.9 mg/dL
Microalb/Creat Ratio: 3 mg/g{creat} (ref 0–29)
Microalbumin, Urine: 3.9 ug/mL

## 2023-08-05 LAB — LIPID PANEL
Chol/HDL Ratio: 3.4 {ratio} (ref 0.0–5.0)
Cholesterol, Total: 150 mg/dL (ref 100–199)
HDL: 44 mg/dL (ref >39)
LDL Chol Calc (NIH): 89 mg/dL (ref 0–99)
Triglycerides: 87 mg/dL (ref 0–149)
VLDL Cholesterol Cal: 17 mg/dL (ref 5–40)

## 2023-08-05 LAB — CMP14+EGFR
ALT: 11 [IU]/L (ref 0–44)
AST: 12 [IU]/L (ref 0–40)
Albumin: 4.4 g/dL (ref 3.8–4.9)
Alkaline Phosphatase: 84 [IU]/L (ref 44–121)
BUN/Creatinine Ratio: 13 (ref 9–20)
BUN: 16 mg/dL (ref 6–24)
Bilirubin Total: 0.4 mg/dL (ref 0.0–1.2)
CO2: 23 mmol/L (ref 20–29)
Calcium: 9.8 mg/dL (ref 8.7–10.2)
Chloride: 99 mmol/L (ref 96–106)
Creatinine, Ser: 1.2 mg/dL (ref 0.76–1.27)
Globulin, Total: 2.7 g/dL (ref 1.5–4.5)
Glucose: 111 mg/dL — ABNORMAL HIGH (ref 70–99)
Potassium: 4.3 mmol/L (ref 3.5–5.2)
Sodium: 141 mmol/L (ref 134–144)
Total Protein: 7.1 g/dL (ref 6.0–8.5)
eGFR: 72 mL/min/{1.73_m2} (ref >59)

## 2023-08-05 LAB — CBC WITH DIFFERENTIAL/PLATELET
Basophils Absolute: 0 10*3/uL (ref 0.0–0.2)
Basos: 0 %
EOS (ABSOLUTE): 0.4 10*3/uL (ref 0.0–0.4)
Eos: 6 %
Hematocrit: 43.9 % (ref 37.5–51.0)
Hemoglobin: 14.3 g/dL (ref 13.0–17.7)
Immature Grans (Abs): 0 10*3/uL (ref 0.0–0.1)
Immature Granulocytes: 0 %
Lymphocytes Absolute: 2.5 10*3/uL (ref 0.7–3.1)
Lymphs: 37 %
MCH: 28.1 pg (ref 26.6–33.0)
MCHC: 32.6 g/dL (ref 31.5–35.7)
MCV: 86 fL (ref 79–97)
Monocytes Absolute: 0.5 10*3/uL (ref 0.1–0.9)
Monocytes: 7 %
Neutrophils Absolute: 3.4 10*3/uL (ref 1.4–7.0)
Neutrophils: 50 %
Platelets: 376 10*3/uL (ref 150–450)
RBC: 5.09 x10E6/uL (ref 4.14–5.80)
RDW: 13.5 % (ref 11.6–15.4)
WBC: 6.9 10*3/uL (ref 3.4–10.8)

## 2023-08-05 LAB — LIPASE: Lipase: 28 U/L (ref 13–78)

## 2023-08-05 LAB — H. PYLORI BREATH COLLECTION

## 2023-08-05 LAB — H. PYLORI BREATH TEST

## 2023-08-08 ENCOUNTER — Telehealth: Payer: Self-pay

## 2023-08-08 NOTE — Telephone Encounter (Signed)
Patient viewed results and Doctor comment through [facility]

## 2023-08-08 NOTE — Telephone Encounter (Signed)
-----   Message from Georgian Co sent at 08/07/2023  9:34 AM EST ----- Your labs look really good overall. Kidney function, liver test, and electrolytes are normal.  Blood count is good.  Cholesterol is great.  Pancreatic enzyme is normal.  You are negative for stomach ulcers.  I do want you to see the gastroenterologist and the referral was placed, but so far all of your labs are reassuring!  Thanks, Georgian Co, PA-C

## 2023-08-17 ENCOUNTER — Telehealth: Payer: Self-pay | Admitting: *Deleted

## 2023-08-17 ENCOUNTER — Encounter: Payer: Self-pay | Admitting: Physician Assistant

## 2023-08-17 ENCOUNTER — Other Ambulatory Visit (HOSPITAL_COMMUNITY): Payer: Self-pay

## 2023-08-17 ENCOUNTER — Ambulatory Visit: Payer: Commercial Managed Care - PPO | Admitting: Physician Assistant

## 2023-08-17 VITALS — BP 122/70 | HR 78 | Ht 70.0 in | Wt 182.0 lb

## 2023-08-17 DIAGNOSIS — R634 Abnormal weight loss: Secondary | ICD-10-CM | POA: Diagnosis not present

## 2023-08-17 DIAGNOSIS — I69354 Hemiplegia and hemiparesis following cerebral infarction affecting left non-dominant side: Secondary | ICD-10-CM | POA: Diagnosis not present

## 2023-08-17 DIAGNOSIS — R1110 Vomiting, unspecified: Secondary | ICD-10-CM

## 2023-08-17 DIAGNOSIS — K219 Gastro-esophageal reflux disease without esophagitis: Secondary | ICD-10-CM | POA: Diagnosis not present

## 2023-08-17 DIAGNOSIS — Z1211 Encounter for screening for malignant neoplasm of colon: Secondary | ICD-10-CM

## 2023-08-17 MED ORDER — NA SULFATE-K SULFATE-MG SULF 17.5-3.13-1.6 GM/177ML PO SOLN
1.0000 | Freq: Once | ORAL | 0 refills | Status: AC
  Filled 2023-08-17: qty 354, 1d supply, fill #0
  Filled 2023-08-21: qty 354, 2d supply, fill #0

## 2023-08-17 NOTE — Patient Instructions (Addendum)
 Silent reflux: Not all heartburn burns...SABRASABRASABRA  What is LPR? Laryngopharyngeal reflux (LPR) or silent reflux is a condition in which acid that is made in the stomach travels up the esophagus (swallowing tube) and gets to the throat. Not everyone with reflux has a lot of heartburn or indigestion. In fact, many people with LPR never have heartburn. This is why LPR is called SILENT REFLUX, and the terms Silent reflux and LPR are often used interchangeably. Because LPR is silent, it is sometimes difficult to diagnose.  How can you tell if you have LPR?  Chronic hoarseness- Some people have hoarseness that comes and goes throat clearing  Cough It can cause shortness of breath and cause asthma like symptoms. a feeling of a lump in the throat  difficulty swallowing a problem with too much nose and throat drainage.  Some people will feel their esophagus spasm which feels like their heart beating hard and fast, this will usually be after a meal, at rest, or lying down at night.    How do I treat this? Treatment for LPR should be individualized, and your doctor will suggest the best treatment for you. Generally there are several treatments for LPR: changing habits and diet to reduce reflux,  medications to reduce stomach acid, and  surgery to prevent reflux. Most people with LPR need to modify how and when they eat, as well as take some medication, to get well. Sometimes, nonprescription liquid antacids, such as Maalox, Gelucil and Mylanta are recommended. When used, these antacids should be taken four times each day - one tablespoon one hour after each meal and before bedtime. Dietary and lifestyle changes alone are not often enough to control LPR - medications that reduce stomach acid are also usually needed. These must be prescribed by our doctor.   TIPS FOR REDUCING REFLUX AND LPR Control your LIFE-STYLE and your DIET! If you use tobacco, QUIT.  Smoking makes you reflux. After every  cigarette you have some LPR.  Don't wear clothing that is too tight, especially around the waist (trousers, corsets, belts).  Do not lie down just after eating...in fact, do not eat within three hours of bedtime.  You should be on a low-fat diet.  Limit your intake of red meat.  Limit your intake of butter.  Avoid fried foods.  Avoid chocolate  Avoid cheese.  Avoid eggs. Specifically avoid caffeine (especially coffee and tea), soda pop (especially cola) and mints.  Avoid alcoholic beverages, particularly in the evening. - Can try over the counter PEPCID/famotatide   Due to recent changes in healthcare laws, you may see the results of your imaging and laboratory studies on MyChart before your provider has had a chance to review them.  We understand that in some cases there may be results that are confusing or concerning to you. Not all laboratory results come back in the same time frame and the provider may be waiting for multiple results in order to interpret others.  Please give us  48 hours in order for your provider to thoroughly review all the results before contacting the office for clarification of your results.    I appreciate the  opportunity to care for you  Thank You   [facility]

## 2023-08-17 NOTE — Telephone Encounter (Signed)
     Request for surgical clearance:    medication   What type of surgery is being performed?     Colonoscopy   When is this surgery scheduled?     09/28/2023  What type of clearance is required ?   Pharmacy  Are there any medications that need to be held prior to surgery and how long? 5 days   Practice name and name of physician performing surgery?      [facility]  What is your office phone and fax number?      Phone- [phone]  Fax- [phone]  Anesthesia type (None, local, MAC, general) ?       MAC   Please route your response to [employer]

## 2023-08-17 NOTE — Progress Notes (Signed)
 08/17/2023 MORT SMELSER [id number] 14-Jul-1971  Referring provider: Danton Jon HERO, PA-C Primary GI doctor: Dr. Legrand  ASSESSMENT AND PLAN:  Special screening for malignant neoplasms, colon No symptoms at this time We have discussed the risks of bleeding, infection, perforation, medication reactions, and remote risk of death associated with colonoscopy. All questions were answered and the patient acknowledges these risk and wishes to proceed.  Regurgitation of food with GERD, with weight loss Schedule EGD with dilatation to evaluate for stenosis, tumor, erosive/infectious esophagititis, and EOE.   If the EGD is negative and symptoms continue after PPI trial, can consider barium swallow versus GES I discussed risks of EGD with patient today, including risk of sedation, bleeding or perforation.  Patient provides understanding and gave verbal consent to proceed. In the interim patient advised about swallowing precautions.  Eat slowly, chew food well before swallowing.  Drink liquids in between each bite to avoid food impaction.  Hemiplegia and hemiparesis following cerebral infarction affecting left non-dominant side (HCC) Hold Plavix  for 5 days before procedure will instruct when and how to resume after procedure.  Patient understands that there is a low but real risk of cardiovascular event such as heart attack, stroke, or embolism /  thrombosis, or ischemia while off Plavix .  The patient consents to proceed.  Will communicate by phone or EMR with patient's prescribing provider to confirm that holding Plavix  is reasonable in this case.     Patient Care Team: Theotis Haze ORN, NP as PCP - General (Nurse Practitioner)  HISTORY OF PRESENT ILLNESS: 53 y.o. male with a past medical history of HTN, DM2, CVA with hemiparesis left side 2018, 2018 Echo no AS, EF 50-55% and others listed below presents for evaluation of colonoscopy while on plavix  and GERD.  08/03/2023 Negative H  pylori breath, negative lipase, CBC, CMET . No family history of colon cancer.  Patient has a BM every day or every other day.  Patient denies change in bowel habits, constipation, diarrhea, hematochezia.  Patient reports GERD mild, epigastric/mid section prior to regurgitation episodes which have been since June, will happen once a week at the most.  He has decreased the amount he is eating to prevent this from happening, has lost about 30-40 lbs over the past year. He has been trying to walk as well but believes this is contributing.  No nausea associated with it. Denies dysphagia.  Patient denies melena.   He  reports that he has never smoked. He has never used smokeless tobacco. He reports that he does not currently use alcohol. He reports that he does not use drugs.  RELEVANT LABS AND IMAGING:  CBC    Component Value Date/Time   WBC 6.9 08/03/2023 1051   WBC 7.0 03/27/2017 0455   RBC 5.09 08/03/2023 1051   RBC 5.43 03/27/2017 0455   HGB 14.3 08/03/2023 1051   HCT 43.9 08/03/2023 1051   PLT 376 08/03/2023 1051   MCV 86 08/03/2023 1051   MCH 28.1 08/03/2023 1051   MCH 27.4 03/27/2017 0455   MCHC 32.6 08/03/2023 1051   MCHC 34.1 03/27/2017 0455   RDW 13.5 08/03/2023 1051   LYMPHSABS 2.5 08/03/2023 1051   MONOABS 0.6 03/27/2017 0455   EOSABS 0.4 08/03/2023 1051   BASOSABS 0.0 08/03/2023 1051   Recent Labs    08/03/23 1051  HGB 14.3    CMP     Component Value Date/Time   NA 141 08/03/2023 1051   K 4.3 08/03/2023 1051  CL 99 08/03/2023 1051   CO2 23 08/03/2023 1051   GLUCOSE 111 (H) 08/03/2023 1051   GLUCOSE 98 04/03/2017 0503   BUN 16 08/03/2023 1051   CREATININE 1.20 08/03/2023 1051   CALCIUM  9.8 08/03/2023 1051   PROT 7.1 08/03/2023 1051   ALBUMIN  4.4 08/03/2023 1051   AST 12 08/03/2023 1051   ALT 11 08/03/2023 1051   ALKPHOS 84 08/03/2023 1051   BILITOT 0.4 08/03/2023 1051   GFRNONAA 64 09/03/2019 0854   GFRAA 74 09/03/2019 0854      Latest Ref Rng &  Units 08/03/2023   10:51 AM 02/16/2021    9:05 AM 01/18/2021    9:35 AM  Hepatic Function  Total Protein 6.0 - 8.5 g/dL 7.1  7.3  7.8   Albumin  3.8 - 4.9 g/dL 4.4  4.5  4.6   AST 0 - 40 IU/L 12  16  13    ALT 0 - 44 IU/L 11  11  14    Alk Phosphatase 44 - 121 IU/L 84  71  103   Total Bilirubin 0.0 - 1.2 mg/dL 0.4  0.4  0.3       Current Medications:   Current Outpatient Medications (Endocrine & Metabolic):    metFORMIN  (GLUCOPHAGE ) 500 MG tablet, Take 2 tablets (1,000 mg total) by mouth 2 (two) times daily with a meal.  Current Outpatient Medications (Cardiovascular):    amLODipine  (NORVASC ) 10 MG tablet, Take 1 tablet (10 mg total) by mouth daily.   atenolol  (TENORMIN ) 25 MG tablet, Take 1 tablet (25 mg total) by mouth daily.   atorvastatin  (LIPITOR ) 40 MG tablet, TAKE 1 TABLET BY MOUTH EVERY DAY   lisinopril -hydrochlorothiazide  (ZESTORETIC ) 20-25 MG tablet, Take 1 tablet by mouth daily.   sildenafil  (VIAGRA ) 100 MG tablet, Take 0.5-1 tablets (50-100 mg total) by mouth daily as needed for erectile dysfunction.   Current Outpatient Medications (Analgesics):    aspirin  81 MG EC tablet, Take 1 tablet (81 mg total) by mouth at bedtime.  Current Outpatient Medications (Hematological):    clopidogrel  (PLAVIX ) 75 MG tablet, Take 1 tablet (75 mg total) by mouth daily.  Current Outpatient Medications (Other):    BD ULTRA-FINE LANCETS lancets, Use as instructed   blood glucose meter kit and supplies KIT, Dispense based on patient and insurance preference. Use up to four times daily as directed. (FOR ICD-9 250.00, 250.01).   Blood Glucose Monitoring Suppl (TRUE METRIX METER) w/Device KIT, 1 each by Does not apply route 3 (three) times daily.   glucose blood (TRUE METRIX BLOOD GLUCOSE TEST) test strip, Use as instructed   omeprazole  (PRILOSEC) 20 MG capsule, Take 1 capsule (20 mg total) by mouth daily.   TRUEplus Lancets 28G MISC, Use as instructed. Check blood glucose level by fingerstick  twice per day.  Medical History:  Past Medical History:  Diagnosis Date   Diabetes mellitus without complication (HCC)    Hypertension    Stroke (HCC) 05/2017   Right [place] Stroke   Allergies: No Known Allergies   Surgical History:  He  has no past surgical history on file. Family History:  His family history includes Hypertension in his mother.  REVIEW OF SYSTEMS  : All other systems reviewed and negative except where noted in the History of Present Illness.  PHYSICAL EXAM: BP 122/70   Pulse 78   Ht 5' 10 (1.778 m)   Wt 182 lb (82.6 kg)   BMI 26.11 kg/m  General Appearance: Well nourished, in no apparent distress.  Head:   Normocephalic and atraumatic. Eyes:  sclerae anicteric,conjunctive pink  Respiratory: Respiratory effort normal, BS equal bilaterally without rales, rhonchi, wheezing. Cardio: RRR with no MRGs. Peripheral pulses intact.  Abdomen: Soft,  Obese ,active bowel sounds. No tenderness . No masses. Rectal: Not evaluated Musculoskeletal: Full ROM, antalgic gait but able to get on bed without assistance. Without edema. Skin:  Dry and intact without significant lesions or rashes Neuro: Alert and  oriented x4;  Mild left sided weakness. Psych:  Cooperative. Normal mood and affect.    Alan JONELLE Coombs, PA-C 11:53 AM

## 2023-08-18 NOTE — Progress Notes (Signed)
 ____________________________________________________________  Attending physician addendum:  Thank you for sending this case to me. I have reviewed the entire note and agree with the plan.  Achalasia also in the differential.  Amada Jupiter, MD  _

## 2023-08-21 ENCOUNTER — Other Ambulatory Visit (HOSPITAL_COMMUNITY): Payer: Self-pay

## 2023-08-21 ENCOUNTER — Other Ambulatory Visit: Payer: Self-pay

## 2023-08-21 NOTE — Telephone Encounter (Signed)
 Verbally agree with current plan to hold plavix 5 days prior to procedure

## 2023-08-21 NOTE — Telephone Encounter (Signed)
 Called and informed the patient that he is ok to hold Plavix 5 days before procedure

## 2023-09-24 ENCOUNTER — Encounter: Payer: Self-pay | Admitting: Certified Registered Nurse Anesthetist

## 2023-09-28 ENCOUNTER — Encounter: Payer: Self-pay | Admitting: Gastroenterology

## 2023-09-28 ENCOUNTER — Ambulatory Visit: Payer: Commercial Managed Care - PPO | Admitting: Gastroenterology

## 2023-09-28 VITALS — BP 111/81 | HR 67 | Temp 97.3°F | Resp 22 | Ht 70.0 in | Wt 182.0 lb

## 2023-09-28 DIAGNOSIS — D125 Benign neoplasm of sigmoid colon: Secondary | ICD-10-CM | POA: Diagnosis not present

## 2023-09-28 DIAGNOSIS — Z1211 Encounter for screening for malignant neoplasm of colon: Secondary | ICD-10-CM

## 2023-09-28 DIAGNOSIS — K5730 Diverticulosis of large intestine without perforation or abscess without bleeding: Secondary | ICD-10-CM

## 2023-09-28 DIAGNOSIS — D123 Benign neoplasm of transverse colon: Secondary | ICD-10-CM | POA: Diagnosis not present

## 2023-09-28 DIAGNOSIS — D120 Benign neoplasm of cecum: Secondary | ICD-10-CM

## 2023-09-28 DIAGNOSIS — K219 Gastro-esophageal reflux disease without esophagitis: Secondary | ICD-10-CM | POA: Diagnosis not present

## 2023-09-28 DIAGNOSIS — K648 Other hemorrhoids: Secondary | ICD-10-CM | POA: Diagnosis not present

## 2023-09-28 DIAGNOSIS — R634 Abnormal weight loss: Secondary | ICD-10-CM

## 2023-09-28 MED ORDER — SODIUM CHLORIDE 0.9 % IV SOLN
500.0000 mL | Freq: Once | INTRAVENOUS | Status: DC
Start: 2023-09-28 — End: 2023-09-28

## 2023-09-28 NOTE — Progress Notes (Signed)
Called to room to assist during endoscopic procedure.  Patient ID and intended procedure confirmed with present staff. Received instructions for my participation in the procedure from the performing physician.

## 2023-09-28 NOTE — Patient Instructions (Addendum)
- Resume previous diet. - Continue present medications. However, try taking the omeprazole as needed. - Do a gastric emptying study at appointment to be scheduled. This is to ensure there is not an additional condition contributing to the reflux. - Resume Plavix (clopidogrel) at prior dose tomorrow. (09/29/2023)  - Await pathology results. - Repeat colonoscopy in 3 years for surveillance.    YOU HAD AN ENDOSCOPIC PROCEDURE TODAY AT THE  ENDOSCOPY CENTER:   Refer to the procedure report that was given to you for any specific questions about what was found during the examination.  If the procedure report does not answer your questions, please call your gastroenterologist to clarify.  If you requested that your care partner not be given the details of your procedure findings, then the procedure report has been included in a sealed envelope for you to review at your convenience later.  YOU SHOULD EXPECT: Some feelings of bloating in the abdomen. Passage of more gas than usual.  Walking can help get rid of the air that was put into your GI tract during the procedure and reduce the bloating. If you had a lower endoscopy (such as a colonoscopy or flexible sigmoidoscopy) you may notice spotting of blood in your stool or on the toilet paper. If you underwent a bowel prep for your procedure, you may not have a normal bowel movement for a few days.  Please Note:  You might notice some irritation and congestion in your nose or some drainage.  This is from the oxygen used during your procedure.  There is no need for concern and it should clear up in a day or so.  SYMPTOMS TO REPORT IMMEDIATELY:  Following lower endoscopy (colonoscopy or flexible sigmoidoscopy):  Excessive amounts of blood in the stool  Significant tenderness or worsening of abdominal pains  Swelling of the abdomen that is new, acute  Fever of 100F or higher  Following upper endoscopy (EGD)  Vomiting of blood or coffee ground  material  New chest pain or pain under the shoulder blades  Painful or persistently difficult swallowing  New shortness of breath  Fever of 100F or higher  Black, tarry-looking stools  For urgent or emergent issues, a gastroenterologist can be reached at any hour by calling [phone]. Do not use MyChart messaging for urgent concerns.    DIET:  We do recommend a small meal at first, but then you may proceed to your regular diet.  Drink plenty of fluids but you should avoid alcoholic beverages for 24 hours.  ACTIVITY:  You should plan to take it easy for the rest of today and you should NOT DRIVE or use heavy machinery until tomorrow (because of the sedation medicines used during the test).    FOLLOW UP: Our staff will call the number listed on your records the next business day following your procedure.  We will call around 7:15- 8:00 am to check on you and address any questions or concerns that you may have regarding the information given to you following your procedure. If we do not reach you, we will leave a message.     If any biopsies were taken you will be contacted by phone or by letter within the next 1-3 weeks.  Please call us at [phone] if you have not heard about the biopsies in 3 weeks.    SIGNATURES/CONFIDENTIALITY: You and/or your care partner have signed paperwork which will be entered into your electronic medical record.  These signatures attest to  the fact that that the information above on your After Visit Summary has been reviewed and is understood.  Full responsibility of the confidentiality of this discharge information lies with you and/or your care-partner.

## 2023-09-28 NOTE — Op Note (Signed)
Zachary [name] Patient Name: Zachary [name] Procedure Date: 09/28/2023 10:40 AM MRN: [id number] Endoscopist: Zachary [name] , MD, [id number] Age: 53 Referring MD:  Date of Birth: 06-22-1971 Gender: Male Account #: [id number] Procedure:                Upper GI endoscopy Indications:              Esophageal reflux symptoms that persist despite                            appropriate therapy (primarily regurgitation                            occurring about 30 minutes after evening meal)                           Denies dysphagia.                           Has had slow weight loss since 2018 CVA, but also                            reports decreasing food intake due to the                            regurgitation Medicines:                Monitored Anesthesia Care Procedure:                Pre-Anesthesia Assessment:                           - Prior to the procedure, a History and Physical                            was performed, and patient medications and                            allergies were reviewed. The patient's tolerance of                            previous anesthesia was also reviewed. The risks                            and benefits of the procedure and the sedation                            options and risks were discussed with the patient.                            All questions were answered, and informed consent                            was obtained. Prior Anticoagulants: The patient has  taken Plavix (clopidogrel), last dose was 5 days                            prior to procedure. ASA Grade Assessment: III - A                            patient with severe systemic disease. After                            reviewing the risks and benefits, the patient was                            deemed in satisfactory condition to undergo the                            procedure.                           After obtaining informed consent, the  endoscope was                            passed under direct vision. Throughout the                            procedure, the patient's blood pressure, pulse, and                            oxygen saturations were monitored continuously. The                            GIF [id number] #[id number] was introduced through the                            mouth, and advanced to the second part of duodenum.                            The upper GI endoscopy was accomplished without                            difficulty. The patient tolerated the procedure                            well. Scope In: Scope Out: Findings:                 The larynx was normal.                           There is no endoscopic evidence of Barrett's                            esophagus, esophagitis, hiatal hernia, areas of                            dilation or lower esophageal sphincter tone  abnormalities in the entire esophagus. Active                            motility was seen.                           The stomach was normal.                           The cardia and gastric fundus were normal on                            retroflexion.                           The examined duodenum was normal. Complications:            No immediate complications. Estimated Blood Loss:     Estimated blood loss: none. Impression:               - Normal larynx.                           - Normal stomach.                           - Normal examined duodenum.                           - No specimens collected.                           Smaller, more frequent meal pattern if feasible.                            Then the evening meal may not have to be the                            largest of the day to then precipitate                            gastroesophageal reflux.                           Daily use of acid suppression medicine will have                            limitations in this case, since heartburn is  not                            the main symptom and these medicines do not prevent                            reflux from occurring. Recommendation:           - Patient has a contact number available for  emergencies. The signs and symptoms of potential                            delayed complications were discussed with the                            patient. Return to normal activities tomorrow.                            Written discharge instructions were provided to the                            patient.                           - Resume previous diet.                           - Continue present medications. However, try taking                            the omeprazole as needed.                           - See the other procedure note for documentation of                            additional recommendations.                           - Do a gastric emptying study at appointment to be                            scheduled. This is to ensure there is not an                            additional condition contributing to the reflux. Zachary Mauch L. Myrtie Neither, MD 09/28/2023 11:42:41 AM This report has been signed electronically.

## 2023-09-28 NOTE — Progress Notes (Signed)
1045 Robinul 0.1 mg IV given due large amount of secretions upon assessment.  MD made aware, vss

## 2023-09-28 NOTE — Progress Notes (Signed)
Report given to PACU, vss

## 2023-09-28 NOTE — Progress Notes (Signed)
Pt's states no medical or surgical changes since previsit or office visit.

## 2023-09-28 NOTE — Op Note (Signed)
[facility] Patient Name: Zachary [name] Procedure Date: 09/28/2023 10:36 AM MRN: [id number] Endoscopist: Sherilyn Cooter L. Myrtie Neither , MD, [id number] Age: 53 Referring MD:  Date of Birth: Dec 01, 1970 Gender: Male Account #: [id number] Procedure:                Colonoscopy Indications:              Screening for colorectal malignant neoplasm, This                            is the patient's first colonoscopy Medicines:                Monitored Anesthesia Care Procedure:                Pre-Anesthesia Assessment:                           - Prior to the procedure, a History and Physical                            was performed, and patient medications and                            allergies were reviewed. The patient's tolerance of                            previous anesthesia was also reviewed. The risks                            and benefits of the procedure and the sedation                            options and risks were discussed with the patient.                            All questions were answered, and informed consent                            was obtained. Prior Anticoagulants: The patient has                            taken Plavix (clopidogrel), last dose was 5 days                            prior to procedure. ASA Grade Assessment: III - A                            patient with severe systemic disease. After                            reviewing the risks and benefits, the patient was                            deemed in satisfactory condition to undergo the  procedure.                           After obtaining informed consent, the colonoscope                            was passed under direct vision. Throughout the                            procedure, the patient's blood pressure, pulse, and                            oxygen saturations were monitored continuously. The                            CF [id number] #[id number] was introduced through the  anus                            and advanced to the the cecum, identified by                            appendiceal orifice and ileocecal valve. The                            colonoscopy was performed without difficulty. The                            patient tolerated the procedure well. The quality                            of the bowel preparation remained fair in some                            areas with scattered fibrous debris despite                            extensive lavage of opaque liquid throughout the                            colon. The ileocecal valve, appendiceal orifice,                            and rectum were photographed. The bowel preparation                            used was SUPREP via split dose instruction. Scope In: 10:58:41 AM Scope Out: 11:18:10 AM Scope Withdrawal Time: 0 hours 15 minutes 31 seconds  Total Procedure Duration: 0 hours 19 minutes 29 seconds  Findings:                 The perianal and digital rectal examinations were                            normal.  Repeat examination of right colon under NBI                            performed.                           Four sessile polyps were found in the sigmoid                            colon(1), transverse colon(1) and cecum(2). The                            polyps were diminutive in size. These polyps were                            removed with a cold snare. Resection and retrieval                            were complete.                           Diverticula were found in the left colon and right                            colon.                           Internal hemorrhoids were found. The hemorrhoids                            were medium-sized.                           The exam was otherwise without abnormality on                            direct and retroflexion views. Complications:            No immediate complications. Estimated Blood Loss:      Estimated blood loss was minimal. Impression:               - Preparation of the colon was fair in some areas                            despite lavage.                           - Four diminutive polyps in the sigmoid colon, in                            the transverse colon and in the cecum, removed with                            a cold snare. Resected and retrieved.                           -  Diverticulosis in the left colon and in the right                            colon.                           - Internal hemorrhoids.                           - The examination was otherwise normal on direct                            and retroflexion views. Recommendation:           - Patient has a contact number available for                            emergencies. The signs and symptoms of potential                            delayed complications were discussed with the                            patient. Return to normal activities tomorrow.                            Written discharge instructions were provided to the                            patient.                           - Resume previous diet.                           - Resume Plavix (clopidogrel) at prior dose                            tomorrow.                           - Await pathology results.                           - Repeat colonoscopy in 3 years for surveillance.                            Nulytely prep for next colonoscopy                           - See the other procedure note for documentation of                            additional recommendations. Elycia Woodside L. Myrtie Neither, MD 09/28/2023 11:34:11 AM This report has been signed electronically.

## 2023-09-28 NOTE — Progress Notes (Signed)
History and Physical:  This patient presents for endoscopic testing for: Encounter Diagnoses  Name Primary?   Special screening for malignant neoplasms, colon Yes   Gastroesophageal reflux disease without esophagitis    Weight loss     Endoscopy evaluation of upper UGI symptoms and for CRC screening. Clinical details outlined in 08/17/23 office consult note, and there have been no significant clinical changes since then. Patient has been off Plavix for last 5 days  Patient is otherwise without complaints or active issues today.   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 No pertinent surgical history.  Allergies: No Known Allergies  Outpatient Meds: Current Outpatient Medications  Medication Sig Dispense Refill   amLODipine (NORVASC) 10 MG tablet Take 1 tablet (10 mg total) by mouth daily. 90 tablet 1   aspirin 81 MG EC tablet Take 1 tablet (81 mg total) by mouth at bedtime. 90 tablet 1   atenolol (TENORMIN) 25 MG tablet Take 1 tablet (25 mg total) by mouth daily. 90 tablet 0   atorvastatin (LIPITOR) 40 MG tablet TAKE 1 TABLET BY MOUTH EVERY DAY 90 tablet 0   BD ULTRA-FINE LANCETS lancets Use as instructed 100 each 12   blood glucose meter kit and supplies KIT Dispense based on patient and insurance preference. Use up to four times daily as directed. (FOR ICD-9 250.00, 250.01). 1 each 0   Blood Glucose Monitoring Suppl (TRUE METRIX METER) w/Device KIT 1 each by Does not apply route 3 (three) times daily. 1 kit 0   glucose blood (TRUE METRIX BLOOD GLUCOSE TEST) test strip Use as instructed 100 each 12   lisinopril-hydrochlorothiazide (ZESTORETIC) 20-25 MG tablet Take 1 tablet by mouth daily. 90 tablet 1   metFORMIN (GLUCOPHAGE) 500 MG tablet Take 2 tablets (1,000 mg total) by mouth 2 (two) times daily with  a meal. 360 tablet 0   omeprazole (PRILOSEC) 20 MG capsule Take 1 capsule (20 mg total) by mouth daily. 30 capsule 3   TRUEplus Lancets 28G MISC Use as instructed. Check blood glucose level by fingerstick twice per day. 100 each 3   clopidogrel (PLAVIX) 75 MG tablet Take 1 tablet (75 mg total) by mouth daily. 90 tablet 1   sildenafil (VIAGRA) 100 MG tablet Take 0.5-1 tablets (50-100 mg total) by mouth daily as needed for erectile dysfunction. 5 tablet 11   Current Facility-Administered Medications  Medication Dose Route Frequency Provider Last Rate Last Admin   0.9 %  sodium chloride infusion  500 mL Intravenous Once Danis, Starr Lake III, MD          ___________________________________________________________________ Objective   Exam:  BP (!) 147/93   Pulse 80   Temp (!) 97.3 F (36.3 C) (Skin)   Resp (!) 26   Ht 5\' 10"  (1.778 m)   Wt 182 lb (82.6 kg)   SpO2 100%   BMI 26.11 kg/m   CV: regular , S1/S2 Resp: clear to auscultation bilaterally, normal RR and effort noted GI: soft, no tenderness, with active bowel sounds.   Assessment: Encounter Diagnoses  Name Primary?   Special screening for malignant neoplasms, colon Yes   Gastroesophageal reflux disease without esophagitis    Weight loss      Plan: Colonoscopy EGD with possible dilation  The benefits and risks of the planned procedure  were described in detail with the patient or (when appropriate) their health care proxy.  Risks were outlined as including, but not limited to, bleeding, infection, perforation, adverse medication reaction leading to cardiac or pulmonary decompensation, pancreatitis (if ERCP).  The limitation of incomplete mucosal visualization was also discussed.  No guarantees or warranties were given.  The patient is appropriate for an endoscopic procedure in the ambulatory setting.   - Amada Jupiter, MD

## 2023-09-29 ENCOUNTER — Telehealth: Payer: Self-pay

## 2023-09-29 NOTE — Telephone Encounter (Signed)
  Follow up Call-     09/28/2023   10:14 AM  Call back number  Post procedure Call Back phone  # [phone]  Permission to leave phone message Yes    Post op call attempted, no answer, left VM.

## 2023-10-02 ENCOUNTER — Encounter: Payer: Self-pay | Admitting: Gastroenterology

## 2023-10-02 LAB — SURGICAL PATHOLOGY

## 2023-10-12 ENCOUNTER — Other Ambulatory Visit: Payer: Self-pay

## 2023-10-12 DIAGNOSIS — K219 Gastro-esophageal reflux disease without esophagitis: Secondary | ICD-10-CM

## 2023-11-07 ENCOUNTER — Encounter (HOSPITAL_COMMUNITY)
Admission: RE | Admit: 2023-11-07 | Discharge: 2023-11-07 | Disposition: A | Discharge status: Home / Self Care | Source: Ambulatory Visit | Attending: Gastroenterology | Admitting: Gastroenterology

## 2023-11-07 DIAGNOSIS — K219 Gastro-esophageal reflux disease without esophagitis: Secondary | ICD-10-CM | POA: Insufficient documentation

## 2023-11-07 MED ORDER — TECHNETIUM TC 99M SULFUR COLLOID
2.0000 | Freq: Once | INTRAVENOUS | Status: AC | PRN
  Administered 2023-11-07: 2 via ORAL

## 2023-11-08 ENCOUNTER — Ambulatory Visit: Admitting: Physician Assistant

## 2023-11-13 ENCOUNTER — Encounter: Payer: Self-pay | Admitting: Gastroenterology

## 2024-01-23 ENCOUNTER — Observation Stay (HOSPITAL_BASED_OUTPATIENT_CLINIC_OR_DEPARTMENT_OTHER)

## 2024-01-23 ENCOUNTER — Other Ambulatory Visit: Payer: Self-pay

## 2024-01-23 ENCOUNTER — Inpatient Hospital Stay (HOSPITAL_COMMUNITY)
Admission: EM | Admit: 2024-01-23 | Discharge: 2024-01-28 | DRG: 287 | Disposition: A | Discharge status: Home / Self Care | Attending: Internal Medicine | Admitting: Internal Medicine

## 2024-01-23 ENCOUNTER — Other Ambulatory Visit: Payer: Self-pay | Admitting: Cardiology

## 2024-01-23 ENCOUNTER — Emergency Department (HOSPITAL_COMMUNITY)

## 2024-01-23 DIAGNOSIS — R0609 Other forms of dyspnea: Secondary | ICD-10-CM | POA: Diagnosis not present

## 2024-01-23 DIAGNOSIS — R1110 Vomiting, unspecified: Secondary | ICD-10-CM | POA: Diagnosis not present

## 2024-01-23 DIAGNOSIS — R079 Chest pain, unspecified: Secondary | ICD-10-CM | POA: Diagnosis not present

## 2024-01-23 DIAGNOSIS — R7989 Other specified abnormal findings of blood chemistry: Principal | ICD-10-CM

## 2024-01-23 DIAGNOSIS — Z8673 Personal history of transient ischemic attack (TIA), and cerebral infarction without residual deficits: Secondary | ICD-10-CM

## 2024-01-23 DIAGNOSIS — Z7982 Long term (current) use of aspirin: Secondary | ICD-10-CM

## 2024-01-23 DIAGNOSIS — I10 Essential (primary) hypertension: Secondary | ICD-10-CM | POA: Diagnosis present

## 2024-01-23 DIAGNOSIS — I160 Hypertensive urgency: Secondary | ICD-10-CM | POA: Diagnosis present

## 2024-01-23 DIAGNOSIS — I252 Old myocardial infarction: Secondary | ICD-10-CM

## 2024-01-23 DIAGNOSIS — E119 Type 2 diabetes mellitus without complications: Secondary | ICD-10-CM | POA: Diagnosis present

## 2024-01-23 DIAGNOSIS — Z79899 Other long term (current) drug therapy: Secondary | ICD-10-CM

## 2024-01-23 DIAGNOSIS — K219 Gastro-esophageal reflux disease without esophagitis: Secondary | ICD-10-CM | POA: Diagnosis present

## 2024-01-23 DIAGNOSIS — Z7902 Long term (current) use of antithrombotics/antiplatelets: Secondary | ICD-10-CM

## 2024-01-23 DIAGNOSIS — I200 Unstable angina: Secondary | ICD-10-CM | POA: Diagnosis present

## 2024-01-23 DIAGNOSIS — Z7984 Long term (current) use of oral hypoglycemic drugs: Secondary | ICD-10-CM

## 2024-01-23 DIAGNOSIS — I69354 Hemiplegia and hemiparesis following cerebral infarction affecting left non-dominant side: Secondary | ICD-10-CM

## 2024-01-23 DIAGNOSIS — I25119 Atherosclerotic heart disease of native coronary artery with unspecified angina pectoris: Principal | ICD-10-CM | POA: Diagnosis present

## 2024-01-23 DIAGNOSIS — R0789 Other chest pain: Secondary | ICD-10-CM | POA: Diagnosis present

## 2024-01-23 DIAGNOSIS — I25118 Atherosclerotic heart disease of native coronary artery with other forms of angina pectoris: Secondary | ICD-10-CM

## 2024-01-23 DIAGNOSIS — E1165 Type 2 diabetes mellitus with hyperglycemia: Secondary | ICD-10-CM

## 2024-01-23 DIAGNOSIS — Z91128 Patient's intentional underdosing of medication regimen for other reason: Secondary | ICD-10-CM

## 2024-01-23 DIAGNOSIS — Z951 Presence of aortocoronary bypass graft: Secondary | ICD-10-CM

## 2024-01-23 DIAGNOSIS — Z8249 Family history of ischemic heart disease and other diseases of the circulatory system: Secondary | ICD-10-CM

## 2024-01-23 DIAGNOSIS — E7800 Pure hypercholesterolemia, unspecified: Secondary | ICD-10-CM | POA: Diagnosis present

## 2024-01-23 DIAGNOSIS — Z91148 Patient's other noncompliance with medication regimen for other reason: Secondary | ICD-10-CM

## 2024-01-23 DIAGNOSIS — I472 Ventricular tachycardia, unspecified: Secondary | ICD-10-CM | POA: Diagnosis present

## 2024-01-23 LAB — ECHOCARDIOGRAM COMPLETE
Area-P 1/2: 4.89 cm2
Height: 70 in
S' Lateral: 3.1 cm
Weight: 2800 [oz_av]

## 2024-01-23 LAB — CBC
HCT: 46.9 % (ref 39.0–52.0)
Hemoglobin: 15.1 g/dL (ref 13.0–17.0)
MCH: 27.2 pg (ref 26.0–34.0)
MCHC: 32.2 g/dL (ref 30.0–36.0)
MCV: 84.4 fL (ref 80.0–100.0)
Platelets: 361 10*3/uL (ref 150–400)
RBC: 5.56 MIL/uL (ref 4.22–5.81)
RDW: 12.3 % (ref 11.5–15.5)
WBC: 6.4 10*3/uL (ref 4.0–10.5)
nRBC: 0 % (ref 0.0–0.2)

## 2024-01-23 LAB — LIPASE, BLOOD: Lipase: 31 U/L (ref 11–51)

## 2024-01-23 LAB — D-DIMER, QUANTITATIVE: D-Dimer, Quant: 0.38 ug{FEU}/mL (ref 0.00–0.50)

## 2024-01-23 LAB — COMPREHENSIVE METABOLIC PANEL WITH GFR
ALT: 11 U/L (ref 0–44)
AST: 16 U/L (ref 15–41)
Albumin: 3.5 g/dL (ref 3.5–5.0)
Alkaline Phosphatase: 81 U/L (ref 38–126)
Anion gap: 10 (ref 5–15)
BUN: 7 mg/dL (ref 6–20)
CO2: 27 mmol/L (ref 22–32)
Calcium: 9.2 mg/dL (ref 8.9–10.3)
Chloride: 101 mmol/L (ref 98–111)
Creatinine, Ser: 1.1 mg/dL (ref 0.61–1.24)
GFR, Estimated: >60 mL/min (ref >60)
Glucose, Bld: 126 mg/dL — ABNORMAL HIGH (ref 70–99)
Potassium: 4.1 mmol/L (ref 3.5–5.1)
Sodium: 138 mmol/L (ref 135–145)
Total Bilirubin: 0.8 mg/dL (ref 0.0–1.2)
Total Protein: 7.2 g/dL (ref 6.5–8.1)

## 2024-01-23 LAB — HEMOGLOBIN A1C
Hgb A1c MFr Bld: 6.9 % — ABNORMAL HIGH (ref 4.8–5.6)
Mean Plasma Glucose: 151 mg/dL

## 2024-01-23 LAB — HIV ANTIBODY (ROUTINE TESTING W REFLEX): HIV Screen 4th Generation wRfx: NONREACTIVE

## 2024-01-23 LAB — URINALYSIS, ROUTINE W REFLEX MICROSCOPIC
Bilirubin Urine: NEGATIVE
Glucose, UA: NEGATIVE mg/dL
Hgb urine dipstick: NEGATIVE
Ketones, ur: NEGATIVE mg/dL
Leukocytes,Ua: NEGATIVE
Nitrite: NEGATIVE
Protein, ur: NEGATIVE mg/dL
Specific Gravity, Urine: 1.013 (ref 1.005–1.030)
pH: 7 (ref 5.0–8.0)

## 2024-01-23 LAB — TROPONIN I (HIGH SENSITIVITY)
Troponin I (High Sensitivity): 126 ng/L (ref <18)
Troponin I (High Sensitivity): 125 ng/L (ref <18)

## 2024-01-23 LAB — TSH: TSH: 2.446 u[IU]/mL (ref 0.350–4.500)

## 2024-01-23 LAB — T4, FREE: Free T4: 0.85 ng/dL (ref 0.61–1.12)

## 2024-01-23 LAB — CBG MONITORING, ED
Glucose-Capillary: 102 mg/dL — ABNORMAL HIGH (ref 70–99)
Glucose-Capillary: 115 mg/dL — ABNORMAL HIGH (ref 70–99)

## 2024-01-23 LAB — BRAIN NATRIURETIC PEPTIDE: B Natriuretic Peptide: 77 pg/mL (ref 0.0–100.0)

## 2024-01-23 MED ORDER — AMLODIPINE BESYLATE 5 MG PO TABS: 5.0000 mg | ORAL_TABLET | Freq: Every day | ORAL | Status: DC

## 2024-01-23 MED ORDER — CARVEDILOL 3.125 MG PO TABS: 3.1250 mg | ORAL_TABLET | Freq: Two times a day (BID) | ORAL | Status: DC

## 2024-01-23 MED ORDER — AMLODIPINE BESYLATE 10 MG PO TABS
10.0000 mg | ORAL_TABLET | Freq: Every day | ORAL | Status: DC
Start: 2024-01-23 — End: 2024-01-28
  Administered 2024-01-23 – 2024-01-28 (×6): 10 mg via ORAL
  Filled 2024-01-23 (×3): qty 1
  Filled 2024-01-23: qty 2
  Filled 2024-01-23 (×2): qty 1

## 2024-01-23 MED ORDER — LISINOPRIL 20 MG PO TABS
20.0000 mg | ORAL_TABLET | Freq: Every day | ORAL | Status: DC
  Administered 2024-01-23 – 2024-01-28 (×6): 20 mg via ORAL
  Filled 2024-01-23 (×6): qty 1

## 2024-01-23 MED ORDER — LISINOPRIL 20 MG PO TABS: 20.0000 mg | ORAL_TABLET | Freq: Every day | ORAL | Status: DC

## 2024-01-23 MED ORDER — PANTOPRAZOLE SODIUM 40 MG PO TBEC
40.0000 mg | DELAYED_RELEASE_TABLET | Freq: Every day | ORAL | Status: DC
  Administered 2024-01-23 – 2024-01-28 (×6): 40 mg via ORAL
  Filled 2024-01-23 (×6): qty 1

## 2024-01-23 MED ORDER — ENOXAPARIN SODIUM 40 MG/0.4ML IJ SOSY
40.0000 mg | PREFILLED_SYRINGE | INTRAMUSCULAR | Status: DC
  Administered 2024-01-23 – 2024-01-25 (×3): 40 mg via SUBCUTANEOUS
  Filled 2024-01-23 (×3): qty 0.4

## 2024-01-23 MED ORDER — METFORMIN HCL 500 MG PO TABS
500.0000 mg | ORAL_TABLET | Freq: Two times a day (BID) | ORAL | Status: DC
  Administered 2024-01-23 – 2024-01-24 (×3): 500 mg via ORAL
  Filled 2024-01-23 (×3): qty 1

## 2024-01-23 MED ORDER — AMLODIPINE BESYLATE 5 MG PO TABS: 10.0000 mg | ORAL_TABLET | Freq: Every day | ORAL | Status: DC

## 2024-01-23 MED ORDER — ATORVASTATIN CALCIUM 40 MG PO TABS
40.0000 mg | ORAL_TABLET | Freq: Every day | ORAL | Status: DC
  Administered 2024-01-23 – 2024-01-26 (×4): 40 mg via ORAL
  Filled 2024-01-23 (×4): qty 1

## 2024-01-23 MED ORDER — CLOPIDOGREL BISULFATE 75 MG PO TABS
75.0000 mg | ORAL_TABLET | Freq: Every day | ORAL | Status: DC
  Administered 2024-01-23 – 2024-01-24 (×2): 75 mg via ORAL
  Filled 2024-01-23 (×3): qty 1

## 2024-01-23 MED ORDER — SODIUM CHLORIDE 0.9 % IV BOLUS
1000.0000 mL | Freq: Once | INTRAVENOUS | Status: AC
  Administered 2024-01-23: 1000 mL via INTRAVENOUS

## 2024-01-23 MED ORDER — METOPROLOL TARTRATE 50 MG PO TABS
50.0000 mg | ORAL_TABLET | Freq: Once | ORAL | Status: AC
  Administered 2024-01-24: 50 mg via ORAL
  Filled 2024-01-23: qty 1

## 2024-01-23 MED ORDER — INSULIN ASPART 100 UNIT/ML IJ SOLN
0.0000 [IU] | Freq: Three times a day (TID) | INTRAMUSCULAR | Status: DC
  Administered 2024-01-26: 1 [IU] via SUBCUTANEOUS

## 2024-01-23 MED ORDER — LISINOPRIL-HYDROCHLOROTHIAZIDE 20-25 MG PO TABS: 1.0000 | ORAL_TABLET | Freq: Every day | ORAL | Status: DC

## 2024-01-23 MED ORDER — ASPIRIN 81 MG PO CHEW
324.0000 mg | CHEWABLE_TABLET | Freq: Once | ORAL | Status: AC
  Administered 2024-01-23: 324 mg via ORAL
  Filled 2024-01-23: qty 4

## 2024-01-23 NOTE — Progress Notes (Signed)
  Echocardiogram 2D Echocardiogram has been performed.  Zachary [name] 01/23/2024, 2:07 PM

## 2024-01-23 NOTE — ED Provider Notes (Signed)
 [place] EMERGENCY DEPARTMENT AT [facility] Provider Note   CSN: [id number] Arrival date & time: 01/23/24  4098     History  Chief Complaint  Patient presents with   Emesis   Nausea    Zachary [name] is a 53 y.o. male.  53 yo M with a cc of difficulty eating.  The patient said for maybe about the past 9 months to a year he has had difficulties where he eats about 30 minutes to an hour later he then has an episode of regurgitation.  Happens with each meal.  Nothing seems to make this better.  Has had progressive fatigue with this as well.  Difficulty moving around at home and just feels too tired to do anything.  He saw his family doctor about this and they referred him to gastroenterology.  He just had an endoscopy that was largely unremarkable as well as a gastric emptying study that also seem to be read as normal.  The patient is a bit frustrated by this and was hoping that we could evaluate him here to try and give him the answer to his symptoms.   Emesis      Home Medications Prior to Admission medications   Medication Sig Start Date End Date Taking? Authorizing Provider  amLODipine  (NORVASC ) 10 MG tablet Take 1 tablet (10 mg total) by mouth daily. 08/03/23   Hassie Lint, PA-C  aspirin  81 MG EC tablet Take 1 tablet (81 mg total) by mouth at bedtime. 12/24/20   Fleming, Zelda W, NP  atenolol  (TENORMIN ) 25 MG tablet Take 1 tablet (25 mg total) by mouth daily. 08/03/23   Hassie Lint, PA-C  atorvastatin  (LIPITOR) 40 MG tablet TAKE 1 TABLET BY MOUTH EVERY DAY 04/16/21   Newlin, Enobong, MD  BD ULTRA-FINE LANCETS lancets Use as instructed 04/14/17   Lynne Sat, PA-C  blood glucose meter kit and supplies KIT Dispense based on patient and insurance preference. Use up to four times daily as directed. (FOR ICD-9 250.00, 250.01). 04/12/17   Rawland Caddy, MD  Blood Glucose Monitoring Suppl (TRUE METRIX METER) w/Device KIT 1 each by Does not apply route 3  (three) times daily. 04/14/17   Lynne Sat, PA-C  clopidogrel  (PLAVIX ) 75 MG tablet Take 1 tablet (75 mg total) by mouth daily. 08/03/23   Hassie Lint, PA-C  glucose blood (TRUE METRIX BLOOD GLUCOSE TEST) test strip Use as instructed 12/24/20   Fleming, Zelda W, NP  lisinopril -hydrochlorothiazide  (ZESTORETIC ) 20-25 MG tablet Take 1 tablet by mouth daily. 08/03/23   McClung, Angela M, PA-C  metFORMIN  (GLUCOPHAGE ) 500 MG tablet Take 2 tablets (1,000 mg total) by mouth 2 (two) times daily with a meal. 08/03/23   McClung, Stan Eans, PA-C  omeprazole  (PRILOSEC) 20 MG capsule Take 1 capsule (20 mg total) by mouth daily. 08/03/23   Hassie Lint, PA-C  sildenafil  (VIAGRA ) 100 MG tablet Take 0.5-1 tablets (50-100 mg total) by mouth daily as needed for erectile dysfunction. 03/29/21   Fleming, Zelda W, NP  TRUEplus Lancets 28G MISC Use as instructed. Check blood glucose level by fingerstick twice per day. 12/24/20   Fleming, Zelda W, NP      Allergies    Patient has no known allergies.    Review of Systems   Review of Systems  Gastrointestinal:  Positive for vomiting.    Physical Exam Updated Vital Signs BP (!) 177/112 (BP Location: Right Arm)   Pulse 87  Temp 98.7 F (37.1 C)   Resp 16   Ht 5\' 10"  (1.778 m)   Wt 79.4 kg   SpO2 100%   BMI 25.11 kg/m  Physical Exam Vitals and nursing note reviewed.  Constitutional:      Appearance: He is well-developed.  HENT:     Head: Normocephalic and atraumatic.  Eyes:     Pupils: Pupils are equal, round, and reactive to light.  Neck:     Vascular: No JVD.  Cardiovascular:     Rate and Rhythm: Normal rate and regular rhythm.     Heart sounds: No murmur heard.    No friction rub. No gallop.  Pulmonary:     Effort: No respiratory distress.     Breath sounds: No wheezing.  Abdominal:     General: There is no distension.     Tenderness: There is no abdominal tenderness. There is no guarding or rebound.  Musculoskeletal:         General: Normal range of motion.     Cervical back: Normal range of motion and neck supple.  Skin:    Coloration: Skin is not pale.     Findings: No rash.  Neurological:     Mental Status: He is alert and oriented to person, place, and time.  Psychiatric:        Behavior: Behavior normal.     ED Results / Procedures / Treatments   Labs (all labs ordered are listed, but only abnormal results are displayed) Labs Reviewed  LIPASE, BLOOD  COMPREHENSIVE METABOLIC PANEL WITH GFR  CBC  URINALYSIS, ROUTINE W REFLEX MICROSCOPIC  BRAIN NATRIURETIC PEPTIDE  TROPONIN I (HIGH SENSITIVITY)    EKG None  Radiology No results found.  Procedures .Critical Care  Performed by: Albertus Hughs, DO Authorized by: Albertus Hughs, DO   Critical care provider statement:    Critical care time (minutes):  35   Critical care time was exclusive of:  Separately billable procedures and treating other patients   Critical care was time spent personally by me on the following activities:  Development of treatment plan with patient or surrogate, discussions with consultants, evaluation of patient's response to treatment, examination of patient, ordering and review of laboratory studies, ordering and review of radiographic studies, ordering and performing treatments and interventions, pulse oximetry, re-evaluation of patient's condition and review of old charts   Care discussed with: admitting provider       Medications Ordered in ED Medications  sodium chloride  0.9 % bolus 1,000 mL (has no administration in time range)    ED Course/ Medical Decision Making/ A&P                                 Medical Decision Making Amount and/or Complexity of Data Reviewed Labs: ordered. Radiology: ordered. ECG/medicine tests: ordered.  Risk OTC drugs. Decision regarding hospitalization.   53 yo M with a chief complaints of difficulty eating.  He tells me he is able to eat but then 30 minutes later he feels like  he needs to vomit.  He typically does regurgitate some of the things that he ate.  This been going on for about 9 months to a year.  He has been recently seen by gastroenterology on my record review he had an endoscopy that was read largely as normal and had a gastric emptying study that also was thought to be without obvious issue.  The patient  is concerned that this may be his heart.  He is not sure what else it could be.  Still sounds GI in nature by history though does have recent unremarkable testing.  Will obtain a laboratory evaluation here.  Chest x-ray.  Reassess.  Cxr independently interpreted by me without focal infiltrate or pneumothorax.  No acute anemia, no significant electrolyte abnormalities.  Thyroid studies are normal.  BNP is negative.  UA negative for infection.  Patient's troponins 126.    I asked some clarifying questions on repeat assessment.  Patient has had some recurrence of left-sided weakness similar to when he had a stroke in the past.  He has no chest pain currently but has chest pain usually shortly after he vomits.  Denies any pain otherwise.  Patient has a normal heart rate, no tachypnea no hypoxia.  Feel PE is unlikely.  Will send off a D-dimer.  Will discuss with cardiology.  I discussed case with cardiology.  They recommended medical admission and they will formally consult.  The patients results and plan were reviewed and discussed.   Any x-rays performed were independently reviewed by myself.   Differential diagnosis were considered with the presenting HPI.  Medications  pantoprazole  (PROTONIX ) EC tablet 40 mg (has no administration in time range)  enoxaparin (LOVENOX) injection 40 mg (has no administration in time range)  insulin aspart (novoLOG) injection 0-9 Units (has no administration in time range)  sodium chloride  0.9 % bolus 1,000 mL (0 mLs Intravenous Stopped 01/23/24 1035)  aspirin  chewable tablet 324 mg (324 mg Oral Given 01/23/24 1103)     Vitals:   01/23/24 0828 01/23/24 1000 01/23/24 1030 01/23/24 1222  BP:  (!) 160/110 (!) 171/110   Pulse:  71 72   Resp:  18 16   Temp:    98.4 F (36.9 C)  TempSrc:    Oral  SpO2:  100% 100%   Weight: 79.4 kg     Height: 5\' 10"  (1.778 m)       Final diagnoses:  Troponin level elevated    Admission/ observation were discussed with the admitting physician, patient and/or family and they are comfortable with the plan.          Final Clinical Impression(s) / ED Diagnoses Final diagnoses:  None    Rx / DC Orders ED Discharge Orders     None         Albertus Hughs, DO 01/23/24 1302

## 2024-01-23 NOTE — Hospital Course (Addendum)
 Recurrent N/V after eating since fall last year Chest pain after he vomits Had endoscopy with GI recently but negative  Admitted for Cards consult  Unremarkable gastric emptying test in March 2025 Stroke (Interval development of acute infarct in the right paramedian inferior pons extending to the medulla.) in 2018  Normal EGD/Colonoscopy in 09/2023   Stroke in 2018 Last year or so, starting throwing up when exercising after eating

## 2024-01-23 NOTE — Progress Notes (Signed)
 Error

## 2024-01-23 NOTE — Consult Note (Signed)
 Cardiology Consultation   Patient ID: Zachary [name] MRN: [id number]; DOB: [birth year]  Admit date: 01/23/2024 Date of Consult: 01/23/2024  PCP:  Zachary Dean, NP   [facility] Providers Cardiologist:  Zachary Keas, MD        Patient Profile: Zachary [name] is a 53 y.o. male with a hx of CVA ( left pontine and right paramedian pontine 2018), type 2 diabetes, hypertension and adjustment disorder who is being seen 01/23/2024 for the evaluation of elevated troponin at the request of Zachary Pereyra, MD.  History of Present Illness: Zachary [name] 53 year old male with past medical history listed above new to heart care. He does have a previous echo in 2018 in context of acute CVA which showed an EF of 50 to 55% with no regional wall motion abnormalities, mild LVH, and normal valves.   Patient presented to the ED this morning 01/23/2024 for difficulty eating that has been ongoing on for 9 months.  He has been seen by his primary care and GI for this issue.  GI did a thorough workup this earlier this year which was negative (upper endoscopy and gastric emptying study). Patient shared with the ED he thought it was cardiac in nature since GI ruled out it being related to his stomach. ED obtained CXR, BNP, thyroid studies, UA, D-Dimer and troponins for further workup.  Troponin was 126.  Other diagnostic's unremarkable. He is also endorsing nausea emesis and fatigue.    ECG today sinus rhythm with ST changes in inferior leads and TWI in V4-V6. , new since last EKG in 2018  Shares about a year ago he started noticing he was not able to exert himself to the full extent.  Difficulty elaborating what would stop him.  He denies chest pain shortness of breath lightheadedness palpitations.  He says he just felt off and needed to rest.  In approximately November he started having his regurgitation episodes that have been ongoing.  He does have chest pain but only after the regurgitation episodes, and  the chest pain is relieved by emesis.  He denies palpitations, PND, orthopnea peripheral edema.   He has no cardiac history; no family history of cardiac issues; and no history of tobacco use.  Patient shared that he stopped taking his medicines several weeks ago due to ongoing emesis.  He has not refilled his medication since December as he has not ran out of his medications.  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     Scheduled Meds:  enoxaparin (LOVENOX) injection  40 mg Subcutaneous Q24H   insulin aspart  0-9 Units Subcutaneous TID WC   pantoprazole   40 mg Oral Daily   Continuous Infusions:  PRN Meds:   Allergies:   No Known Allergies  Social History:   Social History   Socioeconomic History   Marital status: Married    Spouse name: Not on file   Number of children: 3   Years of education: Not on file   Highest education level: 12th grade  Occupational History   Occupation: call center  Tobacco Use   Smoking status: Never   Smokeless tobacco: Never  Vaping Use   Vaping status: Never Used  Substance and Sexual Activity   Alcohol use: Not Currently    Comment: occ   Drug use: No  Sexual activity: Yes  Other Topics Concern   Not on file  Social History Narrative   Not on file   Social Drivers of Health   Financial Resource Strain: Patient Declined (08/02/2023)   Overall Financial Resource Strain (CARDIA)    Difficulty of Paying Living Expenses: Patient declined  Food Insecurity: Patient Declined (08/02/2023)   Hunger Vital Sign    Worried About Running Out of Food in the Last Year: Patient declined    Ran Out of Food in the Last Year: Patient declined  Transportation Needs: No Transportation Needs (08/02/2023)   PRAPARE - Administrator, Civil Service (Medical): No    Lack of Transportation  (Non-Medical): No  Physical Activity: Unknown (08/02/2023)   Exercise Vital Sign    Days of Exercise per Week: 0 days    Minutes of Exercise per Session: Not on file  Stress: No Stress Concern Present (08/02/2023)   [employer] of Occupational Health - Occupational Stress Questionnaire    Feeling of Stress : Not at all  Social Connections: Unknown (08/02/2023)   Social Connection and Isolation Panel [NHANES]    Frequency of Communication with Friends and Family: Twice a week    Frequency of Social Gatherings with Friends and Family: Not on file    Attends Religious Services: Never    Database administrator or Organizations: No    Attends Engineer, structural: Not on file    Marital Status: Divorced  Catering manager Violence: Not on file    Family History:    Family History  Problem Relation Age of Onset   Hypertension Mother      ROS:  Please see the history of present illness.   All other ROS reviewed and negative.     Physical Exam/Data: Vitals:   01/23/24 0828 01/23/24 1000 01/23/24 1030 01/23/24 1222  BP:  (!) 160/110 (!) 171/110   Pulse:  71 72   Resp:  18 16   Temp:    98.4 F (36.9 C)  TempSrc:    Oral  SpO2:  100% 100%   Weight: 79.4 kg     Height: 5\' 10"  (1.778 m)      No intake or output data in the 24 hours ending 01/23/24 1502    01/23/2024    8:28 AM 09/28/2023   10:13 AM 08/17/2023   11:28 AM  Last 3 Weights  Weight (lbs) 175 lb 182 lb 182 lb  Weight (kg) 79.379 kg 82.555 kg 82.555 kg     Body mass index is 25.11 kg/m.  General:  Well nourished, well developed, in no acute distress HEENT: normal Neck: no JVD Vascular:  Distal pulses 2+ bilaterally Cardiac:  normal S1, S2; RRR; no murmur  Lungs:  clear to auscultation bilaterally, no wheezing, rhonchi or rales  Abd: soft, nontender, no hepatomegaly  Ext: no edema Musculoskeletal:  No deformities  Skin: warm and dry  Neuro:  CNs 2-12 intact, no focal abnormalities noted Psych:   Normal affect   EKG:  The EKG was personally reviewed and demonstrates:  See HPI. Telemetry:  Telemetry was personally reviewed and demonstrates:  NSR HR 70s, with occasional PVC  Relevant CV Studies: Echo pending  Laboratory Data: High Sensitivity Troponin:   Recent Labs  Lab 01/23/24 0925  TROPONINIHS 126*     Chemistry Recent Labs  Lab 01/23/24 0847  NA 138  K 4.1  CL 101  CO2 27  GLUCOSE 126*  BUN 7  CREATININE  1.10  CALCIUM  9.2  GFRNONAA >60  ANIONGAP 10    Recent Labs  Lab 01/23/24 0847  PROT 7.2  ALBUMIN 3.5  AST 16  ALT 11  ALKPHOS 81  BILITOT 0.8   Lipids No results for input(s): "CHOL", "TRIG", "HDL", "LABVLDL", "LDLCALC", "CHOLHDL" in the last 168 hours.  Hematology Recent Labs  Lab 01/23/24 0847  WBC 6.4  RBC 5.56  HGB 15.1  HCT 46.9  MCV 84.4  MCH 27.2  MCHC 32.2  RDW 12.3  PLT 361   Thyroid  Recent Labs  Lab 01/23/24 0925  TSH 2.446  FREET4 0.85    BNP Recent Labs  Lab 01/23/24 0925  BNP 77.0    DDimer  Recent Labs  Lab 01/23/24 1106  DDIMER 0.38    Radiology/Studies:  DG Chest Port 1 View Result Date: 01/23/2024 CLINICAL DATA:  CT. EXAM: PORTABLE CHEST 1 VIEW COMPARISON:  None Available. FINDINGS: No focal consolidation, pleural effusion, or pneumothorax. The cardiac silhouette is within normal limits. No acute osseous pathology. IMPRESSION: No active disease. Electronically Signed   By: Angus Bark M.D.   On: 01/23/2024 09:25     Assessment and Plan: Elevated troponin Atypical chest pain Presented to the ED with complaints of difficulty eating, has had a thorough workup by GI in the outpatient setting.  Patient came to ED for workup of possible cardiac etiology. Chest pain only during episodes of regurgitation; relieved by emesis.  Does describe vague atypical symptoms with exertion.  BNP 77.  Unremarkable chest x-ray. TIMI risk score 3.  Troponin 126, next troponin pending. Awaiting trend, could be related to  his uncontrolled hypertension. See below.  Echo pending.  Given patient's risk factors and atypical symptoms, will obtain CCTA for further risk stratification. He has had CT contrast previously without issues. Cr. 1.1.   -if nitroglycerins considered in the future, caution as patient is on sildenafil  prn  Hypertension Blood pressure elevated 177/112.  Previous PCP note to patient having issues affording medications and was directing them out but not taking them consistently.  He is suppose to be on atenolol  25mg , amlodipine  10 mg, and lisinopril  hydrochlorothiazide  20/25 mg daily for his hypertension, However, last filled per dispense report was 07/31/23. Patient shared he stopped taking his medications several weeks ago due to emesis, he has not refilled his medications due to not needing refills at this time.  Hypertension most likely due to medication noncompliance.  -restart home lisinopril  20 mg and amlodipine  10 mg.   -will hold home hydrochlorothiazide  and atenolol  for now  Hyperlipidemia -restart home lipitor 40mg  -lipid panel in the am  5. Dysphagia  Of note outpatient GI has placed him on Prilosec for ongoing dysphagia. Some studies suggest Prilosec/Omeprazole  interacts with Plavix . Would recommend discharging patient on protonix .Per primary  6. H/o CVA Patient has been on ASA 81 mg and Plavix  75mg  since 2018 due to a CVA.  Per Up to Date the use of DAPT for secondary prevention of CVA is not superior to monotherapy, and increases bleeding risk. Would recommend monotherapy with plavix  with change to prilosec seen above. Per primary.  7. SDOH-medications Seen in PCP note previously that he was having difficulty affording medications, would recommend pharmacy help prior to discharge to see if we can help reduce cost in some way. Per primary  Per primary T2DM   Risk Assessment/Risk Scores:      Timi risk score 3  For questions or updates, please contact [facility]  Please consult [email or web address] for contact info under    Signed, Mabel Savage, PA-C  01/23/2024 3:02 PM

## 2024-01-23 NOTE — ED Triage Notes (Addendum)
 Pt. Stated, Ive had N/V for over the last year. It seems like its getting worse.  Almost everytime I eat 30 min. Later it comes back up.Maybe since they can't find anything from my stomach it might be my heart.

## 2024-01-23 NOTE — H&P (Cosign Needed Addendum)
 Date: 01/23/2024               Patient Name:  Zachary [name] MRN: [id number]  DOB: [birth year] Age / Sex: 53 y.o., male   PCP: Collins Dean, NP              Medical Service: Internal Medicine Teaching Service              Attending Physician: Dr. Cherylene Corrente, MD    First Contact: Darletta Ehrich, MS4    Second Contact: Dr. Reather Campbell    Third Contact Dr. Cleven Dallas         After Hours (After 5p/  First Contact Pager: [phone]  weekends / holidays): Second Contact Pager: [phone]   Chief Complaint: food regurgitation with meals  History of Present Illness: Oswald Pott is a 53 year-old male with PMH of HTN, T2DM, and CVA with residual left-sided deficits who presents for recurrent food regurgitation after meals.  Patient started experiencing food regurgitation after meals about 1 year ago. His symptoms generally occur about 30 minutes after meals when partially digested food is regurgitated into his mouth. He will sometimes spit this material out, but he denies actual vomiting. He also denies any dysphagia, abdominal pain, or odynophagia. He has tried eating smaller portions, but this change has only decreased the volume of regurgitation but not the frequency of regurgitation. His main solution has been to avoid eating due to fear of triggering symptoms, resulting in 30-40 pounds of weight loss in the past year. He underwent GI workup with H pylori breath test, endoscopy, colonoscopy, and gastric emptying study earlier this year that were largely non-contributory. Since the symptoms were not caused by his GI system, he thought they may be caused by his heart, prompting presentation to the ED today. He does report chest pain and palpitations that occur only during episodes and have worsened over the past 1-2 months. He denies any exertional chest pain, palpitations, or shortness of breath.  Patient otherwise reports fatigue that has also progressively worsened over the past  year. He used to be able to walk 2 miles, but he is now limited to daily activities around the house. The fatigue caused him to stop working about 2 months ago. The fatigue seems to have made the left-sided weakness from his stroke more prominent than a few years ago. He denies headache, vision changes, sore throat, shortness of breath, abdominal pain, diarrhea, constipation, melena, hematochezia, hematuria, dysuria, or LE edema.   Upon presentation to the ED, patient was hypertensive (177/112) with otherwise normal vital signs. Workup was notable for elevated troponin (126), normal BNP (77), normal TSH/T4 (2.446/0.85), normal d-dimer (0.38), normal lipase (31), unremarkable CMP and CBC, and UA with rare bacteria but no leukocytes or nitrites. He received aspirin  324 mg x1 and 1L NS bolus x1. Cardiology was consulted and recommended admission.  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 Lives in an apartment in [place] by himself. He is independent with iADLs/ADLs but notes that have become more difficult than usual. He does not currently use tobacco products, alcohol, marijuana, heroin, cocaine, or other drugs.   Review of Systems: A complete ROS was negative  except as per HPI.  Physical Exam: Blood pressure (!) 171/110, pulse 72, temperature 98.4 F (36.9 C), temperature source Oral, resp. rate 16, height 5' 10 (1.778 m), weight 79.4 kg, SpO2 100%. General: Well-appearing male sitting upright in hospital bed, appears comfortable and in NAD HENT: Normocephalic and atraumatic. Conjunctivae clear. No nasal congestion or rhinorrhea. Oropharynx without lesions, erythema, or exudate.  Neck: Normal range of motion. No palpable lymphadenopathy or masses. Cardiovascular: Normal rate with regular rhythm. No murmurs, rubs, or gallops. Pulmonary: Lungs CTAB. No wheezes, rales, or rhonchi. Normal respiratory effort on room air. Abdominal: Soft. Non-distended. No tenderness to palpation. Normal bowel sounds.  Extremities: No LE edema. Cool to touch but 2+ radial and PT pulses. Neurological: Alert, responds appropriately to questions. Strength 5/5 throughout except 4/5 left grip strength and LLE extension/flexion. Skin: Warm and dry. Cap refill ~2 seconds.  EKG: personally reviewed my interpretation is sinus rhythm with ST changes in II, III, aVF and T-wave inversions in V4-V6.  CXR: personally reviewed my interpretation is normal.  Assessment & Plan by Problem: Principal Problem:   Chest pain  Vishnu Moeller is a 53 year-old male with PMH of HTN, T2DM, and CVA with residual left-sided deficits who presents for recurrent food regurgitation after meals and admitted on 01/23/2024 for cardiac workup in the setting of elevated troponins.   #Elevated Troponin #Atypical Chest Pain Patient presented with concerns for possible cardiac etiology of recurrent food regurgitation since outpatient GI evaluation has largely been negative. However, patient only experiences chest pain and palpitations during episodes of active regurgitation. He also denies exertional chest pain or shortness of breath, though he has experienced fatigue with decreased exercise endurance. Workup  in the ED was notable for elevated troponin (126 -> 125) and EKG with new ST changes in II, III, aVF and T-wave inversions in V4-V6. Cardiology was consulted and recommended admission for workup of atypical chest pain with echocardiogram and coronary CTA for now.  - Cardiology consulted; appreciate recs - Coronary CTA - Metoprolol tartrate 50 mg x1 before coronary CTA - Follow-up echocardiogram - Telemetry - PT evaluation before discharge  #Food regurgitation Patient presents with 1-year of food regurgitation occurring 30-60 minutes after meals. Outpatient GI workup with H pylori breath testing, endoscopy, colonoscopy, and gastric emptying study has been largely negative, making conditions like IBD, celiac disease, PUD, gastritis, gastroparesis, gastric outlet obstruction, and esophageal strictures less likely. Differential now includes GERD, achalasia, Zenker's diverticulum, esophageal spasm, or even rumination syndrome. Most likely etiology is GERD given normal workup so far. Will discontinue home omeprazole  20 mg daily and replace with pantoprazole  40 mg daily due to potential interactions with clopidogrel . Achalasia is possible, but patient denies dysphagia and there is no evidence of esophageal dilation on CXR. Zenker's diverticulum is less likely since endoscopy was normal. Could consider modified barium swallow in future. Esophageal spasms is possible since regurgitation is associated with chest pain. Will wait to pursue further workup pending cardiac evaluation as above. - Start pantoprazole  40 mg daily - Discontinue home omeprazole  20 mg daily -  SLP evaluation before discharge  #Hypertension Patient hypertensive (BP 177/112) upon presentation to the ED, likely because he has not taken any of his home medications for 2 weeks. Will restart home amlodipine  and lisinopril  for now and hold home atenolol  and hydrochlorothiazide  per cardiology. Will restart/titrate medications as needed while  hospitalized. - Restart home amlodipine  10 mg daily - Restart home lisinopril  20 mg daily - Hold home atenolol  25 mg daily - Hold home hydrochlorothiazide  25 mg daily  #Hx of Left Pontine Stroke Patient reports taking clopidogrel  75 mg daily at home, but he is not currently taking a statin. Lipid panel in 07/2023 with LDL 89, HDL 44, TG 87, and total cholesterol 150. Will start atorvastatin  40 mg daily and repeat lipid panel tomorrow morning. - Restart home clopidogrel  75 mg daily - Start atorvastatin  40 mg daily - Repeat lipid panel  #T2DM Patient reports taking metformin  500 mg BID at home. Most recent HgbA1c 7.1% in 07/2023. Will continue home metformin  while hospitalized in addition to SSI with meals. - Restart home metformin  500 mg BID - Repeat HgbA1c - SSI with meals  #Medication assistance At PCP appointment in 07/2023, patient reported taking medications sparingly to make them last longer. He reports no medications for the past 2 weeks prior to admission, and his medications were last filled on 07/31/2023 per dispense history. Will consult TOC and pharmacy for medication assistance.  - TOC consult  Dispo: Admit patient to Observation with expected length of stay less than 2 midnights.  Signed: Elmira Haddock, Medical Student 01/23/2024, 3:18 PM   Attestation for Student Documentation:  I personally was present and re-performed the history, physical exam and medical decision-making activities of this service and have verified that the service and findings are accurately documented in the student's note.  Cleven Dallas, DO 01/23/2024, 6:06 PM

## 2024-01-24 ENCOUNTER — Observation Stay (HOSPITAL_COMMUNITY)

## 2024-01-24 ENCOUNTER — Encounter (HOSPITAL_COMMUNITY): Payer: Self-pay | Admitting: Internal Medicine

## 2024-01-24 DIAGNOSIS — R1110 Vomiting, unspecified: Secondary | ICD-10-CM

## 2024-01-24 DIAGNOSIS — E7800 Pure hypercholesterolemia, unspecified: Secondary | ICD-10-CM | POA: Diagnosis not present

## 2024-01-24 DIAGNOSIS — K219 Gastro-esophageal reflux disease without esophagitis: Secondary | ICD-10-CM

## 2024-01-24 DIAGNOSIS — I2510 Atherosclerotic heart disease of native coronary artery without angina pectoris: Secondary | ICD-10-CM | POA: Diagnosis not present

## 2024-01-24 DIAGNOSIS — R7989 Other specified abnormal findings of blood chemistry: Secondary | ICD-10-CM | POA: Diagnosis not present

## 2024-01-24 DIAGNOSIS — R079 Chest pain, unspecified: Secondary | ICD-10-CM | POA: Diagnosis not present

## 2024-01-24 DIAGNOSIS — I160 Hypertensive urgency: Secondary | ICD-10-CM | POA: Diagnosis not present

## 2024-01-24 LAB — LIPID PANEL
Cholesterol: 147 mg/dL (ref 0–200)
HDL: 43 mg/dL (ref >40)
LDL Cholesterol: 91 mg/dL (ref 0–99)
Total CHOL/HDL Ratio: 3.4 ratio
Triglycerides: 66 mg/dL (ref <150)
VLDL: 13 mg/dL (ref 0–40)

## 2024-01-24 LAB — GLUCOSE, CAPILLARY
Glucose-Capillary: 87 mg/dL (ref 70–99)
Glucose-Capillary: 97 mg/dL (ref 70–99)
Glucose-Capillary: 99 mg/dL (ref 70–99)
Glucose-Capillary: 97 mg/dL (ref 70–99)

## 2024-01-24 LAB — HEMOGLOBIN A1C
Hgb A1c MFr Bld: 6.6 % — ABNORMAL HIGH (ref 4.8–5.6)
Mean Plasma Glucose: 142.72 mg/dL

## 2024-01-24 LAB — SURGICAL PCR SCREEN
MRSA, PCR: NEGATIVE
Staphylococcus aureus: NEGATIVE

## 2024-01-24 MED ORDER — CARVEDILOL 3.125 MG PO TABS
3.1250 mg | ORAL_TABLET | Freq: Two times a day (BID) | ORAL | Status: DC
  Administered 2024-01-24: 3.125 mg via ORAL
  Filled 2024-01-24: qty 1

## 2024-01-24 MED ORDER — METOPROLOL TARTRATE 5 MG/5ML IV SOLN
INTRAVENOUS | Status: AC
  Filled 2024-01-24: qty 10

## 2024-01-24 MED ORDER — ENSURE PLUS HIGH PROTEIN PO LIQD: 237.0000 mL | Freq: Two times a day (BID) | ORAL | Status: DC

## 2024-01-24 MED ORDER — NITROGLYCERIN 0.4 MG SL SUBL
SUBLINGUAL_TABLET | SUBLINGUAL | Status: AC
Start: 2024-01-24 — End: 2024-01-24
  Filled 2024-01-24: qty 2

## 2024-01-24 MED ORDER — IOHEXOL 350 MG/ML SOLN
100.0000 mL | Freq: Once | INTRAVENOUS | Status: AC | PRN
Start: 2024-01-24 — End: 2024-01-24
  Administered 2024-01-24: 100 mL via INTRAVENOUS

## 2024-01-24 NOTE — Evaluation (Signed)
 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 No pertinent surgical history. HPI:  53 y.o. male  with a hx of CVA ( left pontine and right paramedian pontine 2018), type 2 diabetes, hypertension who presents for recurrent food regurgitation after meals.     Patient started experiencing food regurgitation after meals about 1 year ago. His symptoms generally occur about 30 minutes after meals when partially digested food is regurgitated into his mouth. He will sometimes spit this material out, but he denies actual vomiting. He also denies any dysphagia, abdominal pain, or odynophagia. He has tried eating smaller portions, but this change has only decreased the volume of regurgitation but not the frequency of regurgitation. His main solution has been to avoid eating due to fear of triggering symptoms, resulting in 30-40 pounds of weight loss in the past year. He underwent GI workup with H pylori breath test, endoscopy, colonoscopy, and gastric emptying study earlier this year that were largely non-contributory.    Assessment / Plan / Recommendation  Clinical Impression  Pt demonstrates no signs of oral or oropharyngeal dysphagia. He does report symptoms that can be associated with an esophageal dysphagia such as dysmotility. SLP recommended esophagram to MD and discussed increasing oral intake and nutrition by eating 5-7 small meals a day instead of only attempting 2 meals. Also getting up to walk after eating and avoiding prolonged sitting or laying down. Will f/u after  some results are available. SLP Visit Diagnosis: Dysphagia, unspecified (R13.10)    Aspiration Risk  Risk for inadequate nutrition/hydration    Diet Recommendation Regular;Thin liquid    Liquid Administration via: Cup;Straw Medication Administration: Whole meds with liquid Supervision: Patient able to self feed Compensations: Slow rate;Small sips/bites;Follow solids with liquid Postural Changes: Seated upright at 90 degrees;Remain upright for at least 30 minutes after po intake         Prognosis Prognosis for improved oropharyngeal function: Good      Swallow Study   General HPI: 53 y.o. male  with a hx of CVA ( left pontine and right paramedian pontine 2018), type 2 diabetes, hypertension who presents for recurrent food regurgitation after meals.     Patient started experiencing food regurgitation after meals about 1 year ago. His symptoms generally occur about 30 minutes after meals when partially digested food is regurgitated into his mouth. He will sometimes spit this material out, but he denies actual vomiting. He also denies any dysphagia, abdominal pain, or odynophagia. He has tried eating smaller portions, but this change has only decreased the volume of regurgitation but not the frequency of regurgitation. His main solution has been to avoid eating due to fear of triggering symptoms, resulting in 30-40 pounds of weight loss in the past year. He underwent GI workup with H pylori breath test, endoscopy, colonoscopy, and gastric emptying study earlier this year that were largely non-contributory. Type of Study: Bedside Swallow Evaluation Previous Swallow Assessment: none Diet Prior to this Study: Thin liquids (Level 0);Regular Temperature Spikes Noted: No Respiratory Status: Room air History of Recent Intubation: No Behavior/Cognition: Alert;Cooperative;Pleasant mood  Oral Cavity Assessment: Within Functional Limits Self-Feeding Abilities: Able to feed self Patient Positioning:  Upright in bed Baseline Vocal Quality: Normal Volitional Cough: Strong Volitional Swallow: Able to elicit    Oral/Motor/Sensory Function Overall Oral Motor/Sensory Function: Within functional limits   Ice Chips     Thin Liquid Thin Liquid: Within functional limits    Nectar Thick Nectar Thick Liquid: Not tested   Honey Thick Honey Thick Liquid: Not tested   Puree Puree: Not tested   Solid     Solid: Within functional limits      Ayisha Pol, Hardin Leys 01/24/2024,1:03 PM

## 2024-01-24 NOTE — TOC CM/SW Note (Signed)
 Transition of Care [facility]) - Inpatient Brief Assessment   Patient Details  Name: QUINLIN CONANT MRN: [id number] Date of Birth: 07/15/1971  Transition of Care [facility]) CM/SW Contact:    Juliane Och, LCSW Phone Number: 01/24/2024, 9:09 AM   Clinical Narrative:  9:09 AM Per chart review, patient resides at home with spouse. Patient has a PCP and insurance. Patient does not have SNF history. Patient has HH history with [facility]. Patient has DME history with [facility] (rolling walker and tube bench). Patient's preferred pharmacy's are [facility] and [facility]. No TOC needs were identified at this time. TOC will continue to follow and be available to assist.  Transition of Care Asessment: Insurance and Status: Insurance coverage has been reviewed Patient has primary care physician: Yes Home environment has been reviewed: Private Residence Prior level of function:: N/A Prior/Current Home Services: No current home services (Has HH/DME history) Social Drivers of Health Review: SDOH reviewed no interventions necessary Readmission risk has been reviewed: Yes Transition of care needs: no transition of care needs at this time

## 2024-01-24 NOTE — TOC Initial Note (Signed)
 Transition of Care [facility]) - Initial/Assessment Note    Patient Details  Name: Zachary [name] MRN: [id number] Date of Birth: [birth year]  Transition of Care [facility]) CM/SW Contact:    Jeani Mill, RN Phone Number: 01/24/2024, 1:17 PM  Clinical Narrative:                 Spoke to patient regarding transition needs.  Patient now has medicaid and his prescriptions should be $4 a piece. Patient states he can afford this.  Patient lives alone and can drive himself to apts.  Family/Friend can transport home.  PCP confirmed.  Expected Discharge Plan: Home/Self Care Barriers to Discharge: Continued Medical Work up   Patient Goals and CMS Choice Patient states their goals for this hospitalization and ongoing recovery are:: Return  home          Expected Discharge Plan and Services   Discharge Planning Services: CM Consult   Living arrangements for the past 2 months: Apartment                                      Prior Living Arrangements/Services Living arrangements for the past 2 months: Apartment Lives with:: Self Patient language and need for interpreter reviewed:: Yes Do you feel safe going back to the place where you live?: Yes      Need for Family Participation in Patient Care: Yes (Comment) Care giver support system in place?: Yes (comment)   Criminal Activity/Legal Involvement Pertinent to Current Situation/Hospitalization: No - Comment as needed  Activities of Daily Living   ADL Screening (condition at time of admission) Independently performs ADLs?: Yes (appropriate for developmental age) Is the patient deaf or have difficulty hearing?: No Does the patient have difficulty seeing, even when wearing glasses/contacts?: No Does the patient have difficulty concentrating, remembering, or making decisions?: No  Permission Sought/Granted                  Emotional Assessment Appearance:: Appears stated age Attitude/Demeanor/Rapport: Engaged Affect  (typically observed): Accepting Orientation: : Oriented to Self, Oriented to Place, Oriented to  Time, Oriented to Situation Alcohol / Substance Use: Not Applicable Psych Involvement: No (comment)  Admission diagnosis:  Troponin level elevated [R79.89] Chest pain [R07.9] Patient Active Problem List   Diagnosis Date Noted   Chest pain of uncertain etiology 01/23/2024   Troponin level elevated 01/23/2024   Gastroesophageal reflux disease without esophagitis 01/23/2024   Hypertensive urgency 01/23/2024   Pure hypercholesterolemia 01/23/2024   History of CVA (cerebrovascular accident) 01/23/2024   Adjustment disorder with mixed anxiety and depressed mood    Sleep disturbance    Hyperlipidemia LDL goal <70    Hypokalemia    Hyponatremia    AKI (acute kidney injury) (HCC)    Diabetes mellitus type 2 in nonobese (HCC)    Benign essential HTN    Gait disturbance, post-stroke 03/23/2017   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 03/18/2017   PCP:  Collins Dean, NP [facility]acy:   [facility] [address], [address] [place] [address] Phone: [phone] Fax: [phone]  [facility] [address] [place] [address] Phone: [phone] Fax: [phone]     Social Drivers  of Health (SDOH) Social History: SDOH Screenings   Food Insecurity: Patient Declined (01/24/2024)  Housing: Low Risk  (01/24/2024)  Transportation Needs: No Transportation Needs (01/24/2024)  Utilities: Not At Risk (01/24/2024)  Alcohol Screen: Low Risk  (08/02/2023)  Depression (PHQ2-9): Low Risk  (08/03/2023)  Financial Resource Strain: Patient Declined (08/02/2023)  Physical Activity: Unknown (08/02/2023)  Social  Connections: Unknown (08/02/2023)  Stress: No Stress Concern Present (08/02/2023)  Tobacco Use: Low Risk  (01/24/2024)   SDOH Interventions:     Readmission Risk Interventions     No data to display

## 2024-01-24 NOTE — Progress Notes (Signed)
 Progress Note  Patient Name: DEMOND SHALLENBERGER Date of Encounter: 01/24/2024  [facility] Cardiologist: Gaylyn Keas, MD   Patient Profile     Subjective   Denies any further chest pain or indigestion.  Inpatient Medications    Scheduled Meds:  amLODipine   10 mg Oral Daily   atorvastatin   40 mg Oral Daily   clopidogrel   75 mg Oral Daily   enoxaparin (LOVENOX) injection  40 mg Subcutaneous Q24H   feeding supplement  237 mL Oral BID BM   insulin aspart  0-9 Units Subcutaneous TID WC   lisinopril   20 mg Oral Daily   metFORMIN   500 mg Oral BID   pantoprazole   40 mg Oral Daily   Continuous Infusions:  PRN Meds:    Vital Signs    Vitals:   01/24/24 0315 01/24/24 0617 01/24/24 0625 01/24/24 0821  BP: (!) 132/105 (!) 140/102 (!) 141/96 134/89  Pulse: 72 76 76 76  Resp: 16 20 19    Temp:  98.3 F (36.8 C)    TempSrc:  Oral    SpO2: 100%  99%   Weight:  81.2 kg    Height:  5' 10 (1.778 m)      Intake/Output Summary (Last 24 hours) at 01/24/2024 0824 Last data filed at 01/24/2024 1610 Gross per 24 hour  Intake 240 ml  Output --  Net 240 ml      01/24/2024    6:17 AM 01/23/2024    8:28 AM 09/28/2023   10:13 AM  Last 3 Weights  Weight (lbs) 179 lb 1.6 oz 175 lb 182 lb  Weight (kg) 81.239 kg 79.379 kg 82.555 kg      Telemetry    Normal sinus rhythm- Personally Reviewed  ECG    No new EKG to review- Personally Reviewed  Physical Exam   GEN: No acute distress.   Neck: No JVD Cardiac: RRR, no murmurs, rubs, or gallops.  Respiratory: Clear to auscultation bilaterally. GI: Soft, nontender, non-distended  MS: No edema; No deformity. Neuro:  Nonfocal  Psych: Normal affect   Labs    High Sensitivity Troponin:   Recent Labs  Lab 01/23/24 0925 01/23/24 1454  TROPONINIHS 126* 125*      Chemistry Recent Labs  Lab 01/23/24 0847  NA 138  K 4.1  CL 101  CO2 27  GLUCOSE 126*  BUN 7  CREATININE 1.10  CALCIUM  9.2  PROT 7.2  ALBUMIN 3.5  AST  16  ALT 11  ALKPHOS 81  BILITOT 0.8  GFRNONAA >60  ANIONGAP 10     Hematology Recent Labs  Lab 01/23/24 0847  WBC 6.4  RBC 5.56  HGB 15.1  HCT 46.9  MCV 84.4  MCH 27.2  MCHC 32.2  RDW 12.3  PLT 361    BNP Recent Labs  Lab 01/23/24 0925  BNP 77.0     DDimer  Recent Labs  Lab 01/23/24 1106  DDIMER 0.38     Radiology     Patient Profile     53 y.o. male  with a hx of CVA ( left pontine and right paramedian pontine 2018), type 2 diabetes, hypertension and adjustment disorder who is being seen 01/23/2024 for the evaluation of elevated troponin at the request of [name], MD.   Assessment & Plan    #Elevated troponin #Exertional fatigue #Indigestion symptoms with atypical chest pain - He has been having the symptoms for over 6 months but just recently started having chest pain after  having episodes of regurgitation and chest pain resolves with vomiting -He also has noted exertional intolerance - EGD and gastric emptying study were normal by GI - EKG is concerning for prior anterior and inferior infarcts - High-sensitivity troponin 126 and 125 - Chest x-ray normal - He has not picked up his blood pressure medicines in over 6 months and blood pressure is markedly elevated on presentation today - Question whether some of his symptoms are related to poorly controlled hypertension - Echo this admit: EF 55 to 60% with mild LVH, G1 DD, small inferior apical wall motion abnormality confirmed on strain imaging - Overall LV function normal with a very small focal wall motion abnormality which could indicate CAD.  Cath Lab schedule is full today so will get coronary CTA to define coronary anatomy and rule out CAD   #Hypertension -Poorly controlled due to medical noncompliance>> has not taken blood pressure medicine since December 24 -BP improved after starting blood pressure medication although overnight it was elevated -Apparently they have some been some cost  issues with being able to afford his medications -His medical regimen PTA was supposed to be atenolol  25 mg daily, amlodipine  10 mg daily, lisinopril  HCT 20-25 mg daily -He also has not been able to keep his medicines down recently because of constant episodes of nausea and vomiting -Continue lisinopril  20 mg daily, amlodipine  10 mg daily - He is getting Lopressor 50 mg this morning for his coronary CTA and then after that we will add carvedilol 3.125 mg twice daily    #Hyperlipidemia - LDL 91, HDL 43 this admit  - Patient was not taking medications at home and he has now been restarted on atorvastatin  40 mg daily  - He will need an FLP and ALT in 6 weeks   #History of CVA -Currently on Plavix  since 2018 -Change current omeprazole  to Protonix  40 mg daily which does not have an interaction with Plavix    #Dysphagia #Nausea #Vomiting - Unclear etiology at this time - Has had an extensive workup including EGD and gastric emptying study - Continue Protonix  40 mg daily        For questions or updates, please contact [facility] Please consult [email or web address] for contact info under        Signed, Gaylyn Keas, MD  01/24/2024, 8:24 AM

## 2024-01-24 NOTE — Progress Notes (Signed)
 Subjective:  No significant overnight events. Patient is feeling better this morning. He was able to eat french toast and bacon for breakfast without any regurgitation episodes so far. He denies any chest pain, palpitations, or abdominal pain. Regarding his regurgitation episodes, he could not tell a difference in his symptoms when he was taking omeprazole . He does not really experience heart burn during the episodes. He just notices the food coming back into his mouth, which he then spits out.  Objective:  Vital signs in last 24 hours: Vitals:   01/24/24 0315 01/24/24 0617 01/24/24 0625 01/24/24 0821  BP: (!) 132/105 (!) 140/102 (!) 141/96 134/89  Pulse: 72 76 76 76  Resp: 16 20 19    Temp:  98.3 F (36.8 C)    TempSrc:  Oral    SpO2: 100%  99%   Weight:  81.2 kg    Height:  5' 10 (1.778 m)     Intake/Output Summary (Last 24 hours) at 01/24/2024 1105 Last data filed at 01/24/2024 1610 Gross per 24 hour  Intake 240 ml  Output --  Net 240 ml   General: Well-appearing male lying comfortably in hospital bed with RN at bedside, in NAD HENT: Normocephalic and atraumatic. Conjunctivae clear. No nasal congestion or rhinorrhea. Cardiovascular: Normal rate with regular rhythm. No murmurs, rubs, or gallops. Pulmonary: Normal respiratory effort on room air. Abdominal: Soft. Non-distended. No tenderness to palpation. Normal bowel sounds.  Neurological: Alert, responds appropriately to questions. Skin: Warm and dry.  Assessment/Plan:  Principal Problem:   Chest pain of uncertain etiology Active Problems:   Troponin level elevated   Gastroesophageal reflux disease without esophagitis   Hypertensive urgency   Pure hypercholesterolemia   History of CVA (cerebrovascular accident)  Zachary [name] is a 53 year-old male with PMH of HTN, T2DM, and CVA with residual left-sided deficits who presents for recurrent food regurgitation after meals and admitted on 01/23/2024 for cardiac workup in the  setting of elevated troponin and atypical chest pain.   #Elevated Troponin #Atypical Chest Pain Patient presented with concerns for possible cardiac etiology of recurrent food regurgitation since outpatient GI evaluation has largely been negative. However, patient only experiences chest pain and palpitations during episodes of active regurgitation. He also denies exertional chest pain or shortness of breath, though he has experienced exercise intolerance. Initial workup notable for elevated troponin (126->125) and EKG with new ST changes in II, III, aVF and T-wave inversions in V4-V6. Cardiology was consulted and recommended echocardiogram and coronary CTA. Echocardiogram showed EF 55 to 60% with mild LVH, grade 1 diastolic dysfunction, and small inferior apical wall motion abnormality confirmed on strain imaging. Coronary CTA is scheduled for today to assess for CAD. - Cardiology consulted; appreciate recs - Follow-up coronary CTA - Telemetry - PT evaluation before discharge   #Food regurgitation Patient presents with 1-year of food regurgitation occurring 30-60 minutes after meals. Outpatient GI workup with H pylori breath testing, endoscopy, colonoscopy, and gastric emptying study has been largely negative, making conditions like IBD, celiac disease, PUD, gastritis, gastroparesis, gastric outlet obstruction, and esophageal strictures less likely. Differential now includes GERD, achalasia, Zenker's diverticulum, esophageal spasm, or even rumination syndrome. Most likely etiology is GERD given normal workup so far. Will continue pantoprazole  40 mg daily in place of home omeprazole  20 mg daily due to potential interactions with clopidogrel . Achalasia is possible, but patient denies dysphagia and there is no evidence of esophageal dilation on CXR. Zenker's diverticulum is less likely since he denies dysphagia.  Could consider modified barium swallow in future. Esophageal spasms is possible since  regurgitation is associated with chest pain. Will consult SLP for now and wait to pursue further workup pending cardiac evaluation as above. - Pantoprazole  40 mg daily - Discontinue home omeprazole  20 mg daily - SLP consult   #Hypertension Blood pressure appropriately decreasing after restarting home amlodipine  and lisinopril . Will start carvedilol 3.125 mg BID after coronary CTA and continue holding home atenolol  and hydrochlorothiazide  per cardiology. - Start carvedilol 3.125 mg BID - Home amlodipine  10 mg daily - Home lisinopril  20 mg daily - Hold home atenolol  25 mg daily - Hold home hydrochlorothiazide  25 mg daily   #Hx of Left Pontine Stroke Patient reports taking clopidogrel  75 mg daily at home, but he is not currently taking a statin. Repeat lipid panel this admission with LDL 91, HDL 43, TG 66, and total cholesterol 147. Will start atorvastatin  40 mg daily and plan to repeat lipid panel and ALT outpatient in 6 weeks. - Home clopidogrel  75 mg daily - Atorvastatin  40 mg daily - Repeat lipid panel and ALT outpatient in 6 weeks   #T2DM Patient reports taking metformin  500 mg BID at home. Repeat HgbA1c 6.9% this admission. Will continue home metformin  while hospitalized in addition to SSI with meals. - Home metformin  500 mg BID - SSI with meals   #Medication assistance At PCP appointment in 07/2023, patient reported taking medications sparingly to make them last longer. He also reports taking no medications for the past 2 weeks prior to admission, and his medications were last filled on 07/31/2023 per dispense history. Per TOC, patient has Medicaid and therefore is not eligible for anything less than the $4 copay. Have contacted case management for medication assistance. - Appreciate case management assistance   LOS: 0 days   Zachary [name], Medical Student 01/24/2024, 11:05 AM

## 2024-01-25 ENCOUNTER — Encounter (HOSPITAL_COMMUNITY)
Admission: EM | Disposition: A | Discharge status: Home / Self Care | Payer: Self-pay | Source: Home / Self Care | Attending: Internal Medicine

## 2024-01-25 DIAGNOSIS — I2583 Coronary atherosclerosis due to lipid rich plaque: Secondary | ICD-10-CM

## 2024-01-25 DIAGNOSIS — R079 Chest pain, unspecified: Secondary | ICD-10-CM | POA: Diagnosis not present

## 2024-01-25 DIAGNOSIS — R7989 Other specified abnormal findings of blood chemistry: Secondary | ICD-10-CM | POA: Diagnosis not present

## 2024-01-25 DIAGNOSIS — I2510 Atherosclerotic heart disease of native coronary artery without angina pectoris: Secondary | ICD-10-CM | POA: Diagnosis not present

## 2024-01-25 DIAGNOSIS — K219 Gastro-esophageal reflux disease without esophagitis: Secondary | ICD-10-CM | POA: Diagnosis not present

## 2024-01-25 DIAGNOSIS — R1110 Vomiting, unspecified: Secondary | ICD-10-CM | POA: Diagnosis not present

## 2024-01-25 HISTORY — PX: LEFT HEART CATH AND CORONARY ANGIOGRAPHY: CATH118249

## 2024-01-25 LAB — GLUCOSE, CAPILLARY
Glucose-Capillary: 84 mg/dL (ref 70–99)
Glucose-Capillary: 118 mg/dL — ABNORMAL HIGH (ref 70–99)
Glucose-Capillary: 63 mg/dL — ABNORMAL LOW (ref 70–99)
Glucose-Capillary: 67 mg/dL — ABNORMAL LOW (ref 70–99)
Glucose-Capillary: 101 mg/dL — ABNORMAL HIGH (ref 70–99)
Glucose-Capillary: 116 mg/dL — ABNORMAL HIGH (ref 70–99)

## 2024-01-25 LAB — BASIC METABOLIC PANEL WITH GFR
Anion gap: 10 (ref 5–15)
BUN: 13 mg/dL (ref 6–20)
CO2: 24 mmol/L (ref 22–32)
Calcium: 9.2 mg/dL (ref 8.9–10.3)
Chloride: 102 mmol/L (ref 98–111)
Creatinine, Ser: 1 mg/dL (ref 0.61–1.24)
GFR, Estimated: >60 mL/min (ref >60)
Glucose, Bld: 89 mg/dL (ref 70–99)
Potassium: 3.7 mmol/L (ref 3.5–5.1)
Sodium: 136 mmol/L (ref 135–145)

## 2024-01-25 SURGERY — LEFT HEART CATH AND CORONARY ANGIOGRAPHY
Anesthesia: LOCAL

## 2024-01-25 MED ORDER — FENTANYL CITRATE (PF) 100 MCG/2ML IJ SOLN
INTRAMUSCULAR | Status: DC | PRN
  Administered 2024-01-25: 25 ug via INTRAVENOUS

## 2024-01-25 MED ORDER — LIDOCAINE HCL (PF) 1 % IJ SOLN
INTRAMUSCULAR | Status: AC
Start: 2024-01-25 — End: 2024-01-25
  Filled 2024-01-25: qty 30

## 2024-01-25 MED ORDER — SODIUM CHLORIDE 0.9 % WEIGHT BASED INFUSION
3.0000 mL/kg/h | INTRAVENOUS | Status: AC
Start: 2024-01-25 — End: 2024-01-25
  Administered 2024-01-25: 3 mL/kg/h via INTRAVENOUS

## 2024-01-25 MED ORDER — DEXTROSE 50 % IV SOLN: 12.5000 g | INTRAVENOUS | Status: AC

## 2024-01-25 MED ORDER — VERAPAMIL HCL 2.5 MG/ML IV SOLN
INTRAVENOUS | Status: DC | PRN
  Administered 2024-01-25: 10 mL via INTRA_ARTERIAL

## 2024-01-25 MED ORDER — HEPARIN (PORCINE) IN NACL 1000-0.9 UT/500ML-% IV SOLN
INTRAVENOUS | Status: DC | PRN
  Administered 2024-01-25 (×2): 500 mL

## 2024-01-25 MED ORDER — CLOPIDOGREL BISULFATE 75 MG PO TABS
75.0000 mg | ORAL_TABLET | Freq: Every day | ORAL | Status: DC
  Administered 2024-01-25: 75 mg via ORAL
  Filled 2024-01-25: qty 1

## 2024-01-25 MED ORDER — SODIUM CHLORIDE 0.9% FLUSH: 3.0000 mL | INTRAVENOUS | Status: DC | PRN

## 2024-01-25 MED ORDER — MIDAZOLAM HCL 2 MG/2ML IJ SOLN
INTRAMUSCULAR | Status: AC
  Filled 2024-01-25: qty 2

## 2024-01-25 MED ORDER — ASPIRIN 81 MG PO TBEC
81.0000 mg | DELAYED_RELEASE_TABLET | Freq: Every day | ORAL | Status: DC
  Administered 2024-01-26 – 2024-01-28 (×3): 81 mg via ORAL
  Filled 2024-01-25 (×3): qty 1

## 2024-01-25 MED ORDER — ONDANSETRON HCL 4 MG/2ML IJ SOLN
4.0000 mg | Freq: Four times a day (QID) | INTRAMUSCULAR | Status: DC | PRN
Start: 2024-01-25 — End: 2024-01-28

## 2024-01-25 MED ORDER — HEPARIN SODIUM (PORCINE) 1000 UNIT/ML IJ SOLN
INTRAMUSCULAR | Status: AC
  Filled 2024-01-25: qty 10

## 2024-01-25 MED ORDER — DEXTROSE 50 % IV SOLN
12.5000 g | INTRAVENOUS | Status: AC
  Administered 2024-01-25: 12.5 g via INTRAVENOUS
  Filled 2024-01-25: qty 50

## 2024-01-25 MED ORDER — SODIUM CHLORIDE 0.9% FLUSH
3.0000 mL | Freq: Two times a day (BID) | INTRAVENOUS | Status: AC
Start: 2024-01-25 — End: ?
  Administered 2024-01-26 – 2024-01-28 (×4): 3 mL via INTRAVENOUS

## 2024-01-25 MED ORDER — LIDOCAINE HCL (PF) 1 % IJ SOLN
INTRAMUSCULAR | Status: DC | PRN
  Administered 2024-01-25: 2 mL

## 2024-01-25 MED ORDER — CARVEDILOL 6.25 MG PO TABS
6.2500 mg | ORAL_TABLET | Freq: Two times a day (BID) | ORAL | Status: DC
  Administered 2024-01-25 – 2024-01-26 (×2): 6.25 mg via ORAL
  Filled 2024-01-25 (×2): qty 1

## 2024-01-25 MED ORDER — IOHEXOL 350 MG/ML SOLN
INTRAVENOUS | Status: DC | PRN
  Administered 2024-01-25: 50 mL via INTRA_ARTERIAL

## 2024-01-25 MED ORDER — DEXTROSE 50 % IV SOLN
INTRAVENOUS | Status: AC
  Administered 2024-01-25: 12.5 g via INTRAVENOUS
  Filled 2024-01-25: qty 50

## 2024-01-25 MED ORDER — SODIUM CHLORIDE 0.9 % IV SOLN: INTRAVENOUS | Status: AC

## 2024-01-25 MED ORDER — LABETALOL HCL 5 MG/ML IV SOLN: 10.0000 mg | INTRAVENOUS | Status: AC | PRN

## 2024-01-25 MED ORDER — HYDRALAZINE HCL 20 MG/ML IJ SOLN: 10.0000 mg | INTRAMUSCULAR | Status: AC | PRN

## 2024-01-25 MED ORDER — MIDAZOLAM HCL 2 MG/2ML IJ SOLN
INTRAMUSCULAR | Status: DC | PRN
  Administered 2024-01-25: 1 mg via INTRAVENOUS

## 2024-01-25 MED ORDER — SODIUM CHLORIDE 0.9 % WEIGHT BASED INFUSION: 1.0000 mL/kg/h | INTRAVENOUS | Status: DC

## 2024-01-25 MED ORDER — ASPIRIN 81 MG PO CHEW
81.0000 mg | CHEWABLE_TABLET | ORAL | Status: AC
  Administered 2024-01-25: 81 mg via ORAL
  Filled 2024-01-25: qty 1

## 2024-01-25 MED ORDER — SODIUM CHLORIDE 0.9 % IV SOLN: 250.0000 mL | INTRAVENOUS | Status: AC | PRN

## 2024-01-25 MED ORDER — ACETAMINOPHEN 325 MG PO TABS: 650.0000 mg | ORAL_TABLET | ORAL | Status: DC | PRN

## 2024-01-25 MED ORDER — VERAPAMIL HCL 2.5 MG/ML IV SOLN
INTRAVENOUS | Status: AC
Start: 2024-01-25 — End: 2024-01-25
  Filled 2024-01-25: qty 2

## 2024-01-25 MED ORDER — HEPARIN SODIUM (PORCINE) 1000 UNIT/ML IJ SOLN
INTRAMUSCULAR | Status: DC | PRN
  Administered 2024-01-25: 4000 [IU] via INTRAVENOUS

## 2024-01-25 MED ORDER — FENTANYL CITRATE (PF) 100 MCG/2ML IJ SOLN
INTRAMUSCULAR | Status: AC
  Filled 2024-01-25: qty 2

## 2024-01-25 SURGICAL SUPPLY — 6 items
CATH 5FR JL3.5 JR4 ANG PIG MP (CATHETERS) IMPLANT
DEVICE RAD COMP TR BAND LRG (VASCULAR PRODUCTS) IMPLANT
GLIDESHEATH SLEND SS 6F .021 (SHEATH) IMPLANT
GUIDEWIRE INQWIRE 1.5J.035X260 (WIRE) IMPLANT
PACK CARDIAC CATHETERIZATION (CUSTOM PROCEDURE TRAY) ×1 IMPLANT
SET ATX-X65L (MISCELLANEOUS) IMPLANT

## 2024-01-25 NOTE — Progress Notes (Signed)
 Hypoglycemic Event  CBG: 63  Treatment: D50 25 mL (12.5 gm)  Symptoms: None  Follow-up CBG: Time:1221 CBG Result:118  Possible Reasons for Event: Other: NPO  Comments/MD notified:    [employer]

## 2024-01-25 NOTE — Progress Notes (Addendum)
 Progress Note  Patient Name: Zachary [name] Date of Encounter: 01/25/2024  [facility] Cardiologist: Gaylyn Keas, MD   Patient Profile     Subjective   Denies CP or SOB. Coronary CTA yesterday demonstrated Cor cal score of 1442, 70-99% mid LAD/D1/OM1/ostial Ramus, 50-70% ARVB.  Now NPO for cath today  Inpatient Medications    Scheduled Meds:  amLODipine   10 mg Oral Daily   atorvastatin   40 mg Oral Daily   carvedilol  3.125 mg Oral BID   clopidogrel   75 mg Oral Daily   enoxaparin (LOVENOX) injection  40 mg Subcutaneous Q24H   feeding supplement  237 mL Oral BID BM   insulin aspart  0-9 Units Subcutaneous TID WC   lisinopril   20 mg Oral Daily   metFORMIN   500 mg Oral BID   pantoprazole   40 mg Oral Daily   Continuous Infusions:  PRN Meds:    Vital Signs    Vitals:   01/24/24 1934 01/24/24 2332 01/25/24 0625 01/25/24 0713  BP: (!) 125/95 (!) 121/91  (!) 135/96  Pulse: 69 63 81 78  Resp: (!) 23 20  18   Temp: 98.1 F (36.7 C) 98.3 F (36.8 C) 98.4 F (36.9 C) 98.8 F (37.1 C)  TempSrc: Oral Oral Oral Oral  SpO2:    99%  Weight:   81.1 kg   Height:        Intake/Output Summary (Last 24 hours) at 01/25/2024 0755 Last data filed at 01/25/2024 7829 Gross per 24 hour  Intake 240 ml  Output --  Net 240 ml      01/25/2024    6:25 AM 01/24/2024    6:17 AM 01/23/2024    8:28 AM  Last 3 Weights  Weight (lbs) 178 lb 12.8 oz 179 lb 1.6 oz 175 lb  Weight (kg) 81.103 kg 81.239 kg 79.379 kg      Telemetry    NSR- Personally Reviewed  ECG    No new EKG to review- Personally Reviewed  Physical Exam   GEN: Well nourished, well developed in no acute distress HEENT: Normal NECK: No JVD; No carotid bruits LYMPHATICS: No lymphadenopathy CARDIAC:RRR, no murmurs, rubs, gallops RESPIRATORY:  Clear to auscultation without rales, wheezing or rhonchi  ABDOMEN: Soft, non-tender, non-distended MUSCULOSKELETAL:  No edema; No deformity  SKIN: Warm and  dry NEUROLOGIC:  Alert and oriented x 3 PSYCHIATRIC:  Normal affect  Labs    High Sensitivity Troponin:   Recent Labs  Lab 01/23/24 0925 01/23/24 1454  TROPONINIHS 126* 125*      Chemistry Recent Labs  Lab 01/23/24 0847 01/25/24 0232  NA 138 136  K 4.1 3.7  CL 101 102  CO2 27 24  GLUCOSE 126* 89  BUN 7 13  CREATININE 1.10 1.00  CALCIUM  9.2 9.2  PROT 7.2  --   ALBUMIN 3.5  --   AST 16  --   ALT 11  --   ALKPHOS 81  --   BILITOT 0.8  --   GFRNONAA >60 >60  ANIONGAP 10 10     Hematology Recent Labs  Lab 01/23/24 0847  WBC 6.4  RBC 5.56  HGB 15.1  HCT 46.9  MCV 84.4  MCH 27.2  MCHC 32.2  RDW 12.3  PLT 361    BNP Recent Labs  Lab 01/23/24 0925  BNP 77.0     DDimer  Recent Labs  Lab 01/23/24 1106  DDIMER 0.38     Radiology  Patient Profile     53 y.o. male  with a hx of CVA ( left pontine and right paramedian pontine 2018), type 2 diabetes, hypertension and adjustment disorder who is being seen 01/23/2024 for the evaluation of elevated troponin at the request of [name], MD.   Assessment & Plan    #Elevated troponin #Exertional fatigue #Indigestion symptoms with atypical chest pain - He has been having the symptoms for over 6 months but just recently started having chest pain after having episodes of regurgitation and chest pain resolves with vomiting -He also has noted exertional intolerance - EGD and gastric emptying study were normal by GI - EKG is concerning for prior anterior and inferior infarcts - High-sensitivity troponin 126 and 125 - Echo this admit: EF 55 to 60% with mild LVH, G1 DD, small inferior apical wall motion abnormality confirmed on strain imaging - Coronary CTA yesterday demonstrated Cor cal score of 1442, 70-99% mid LAD/D1/OM1/ostial Ramus, 50-70% ARVB. -he is NPO for cath today with possible PCI although with degree of CAD may need CABG -hold metformin  for now   #Hypertension -Poorly controlled due to  medical noncompliance>> has not taken blood pressure medicine since December 24 -BP improved after starting blood pressure medication although overnight it was elevated -Apparently they have some been some cost issues with being able to afford his medications -His medical regimen PTA was supposed to be atenolol  25 mg daily, amlodipine  10 mg daily, lisinopril  HCT 20-25 mg daily -He also has not been able to keep his medicines down recently because of constant episodes of nausea and vomiting -BP 135/64mmHg this am -continue Lisinopril  20mg  daily and Amlodipine  10mg  daily -increase Carvedilol to 6.25mg  BID   #Hyperlipidemia - LDL 91, HDL 43 this admit  - Patient was not taking medications at home and he has now been restarted on atorvastatin  40 mg daily  - would aim for LDL goal<55 given extent of his CAD - continue Atorvastatin  40mg  daily - He will need an FLP and ALT in 6 weeks   #History of CVA -Currently on Plavix  since 2018 -Change current omeprazole  to Protonix  40 mg daily which does not have an interaction with Plavix  -continue Plavix  75mg  daily   #Dysphagia #Nausea #Vomiting - suspect this was actually due to extensive CAD - Has had an extensive workup including EGD and gastric emptying study - Continue Protonix  40 mg daily      For questions or updates, please contact [facility] Please consult [email or web address] for contact info under        Signed, Gaylyn Keas, MD  01/25/2024, 7:55 AM

## 2024-01-25 NOTE — Progress Notes (Addendum)
 Subjective:  Interval events: Coronary CTA resulted with coronary calcium  score of 1442 (99th percentile), severe (70-99%) stenosis in the mid LAD, medial D1 bifurcation, OM1, and ostial ramus intermedius, and moderate (50-70%) stenosis of large acute marginal. LHC was scheduled for today (6/12).  Patient is otherwise feeling well this morning. He was able to eat supper without food regurgitation, though he only ate small portions. He has not eaten anything since he is NPO for the LHC. He denies any chest pain or shortness of breath. He has walked around the room without any difficulties.  Objective:  Vital signs in last 24 hours: Vitals:   01/24/24 2332 01/25/24 0625 01/25/24 0713 01/25/24 1120  BP: (!) 121/91  (!) 135/96 (!) 133/93  Pulse: 63 81 78 71  Resp: 20  18 20   Temp: 98.3 F (36.8 C) 98.4 F (36.9 C) 98.8 F (37.1 C) 98.5 F (36.9 C)  TempSrc: Oral Oral Oral Oral  SpO2:   99% 98%  Weight:  81.1 kg    Height:       Weight change: 1.724 kg  Intake/Output Summary (Last 24 hours) at 01/25/2024 1248 Last data filed at 01/25/2024 1234 Gross per 24 hour  Intake 240 ml  Output --  Net 240 ml   General: Well-appearing male sitting comfortably in bedside chair, in NAD HENT: Normocephalic and atraumatic. Conjunctivae clear. No nasal congestion or rhinorrhea. Cardiovascular: Normal rate with regular rhythm. No murmurs, rubs, or gallops. Pulmonary: Normal respiratory effort on room air. Abdominal: Soft. Non-distended. No tenderness to palpation. Normal bowel sounds.  Neurological: Alert, responds appropriately to questions. Skin: Warm and dry.  Assessment/Plan:  Principal Problem:   Chest pain of uncertain etiology Active Problems:   Troponin level elevated   Gastroesophageal reflux disease without esophagitis   Hypertensive urgency   Pure hypercholesterolemia   History of CVA (cerebrovascular accident)   Regurgitation of food  [name] is a 53 year-old male  with PMH of HTN, T2DM, and CVA with residual left-sided deficits who presents for recurrent food regurgitation after meals and admitted on 01/23/2024 for cardiac workup in the setting of elevated troponin and atypical chest pain.   #Elevated Troponin #Atypical Chest Pain Patient presented with concerns for possible cardiac etiology of recurrent food regurgitation since outpatient GI evaluation has largely been negative. However, patient only experiences chest pain and palpitations during episodes of active regurgitation. He also denies exertional chest pain or shortness of breath, though he has experienced exercise intolerance. Initial workup notable for elevated troponin (126->125) and EKG with new ST changes in II, III, aVF and T-wave inversions in V4-V6. Cardiology was consulted and recommended echocardiogram and coronary CTA. Echocardiogram showed EF 55 to 60% with mild LVH, grade 1 diastolic dysfunction, and small inferior apical wall motion abnormality confirmed on strain imaging. Coronary CT with coronary calcium  score of 1442 (99th percentile), severe (70-99%) stenosis in the mid LAD, medial D1 bifurcation, OM1, and ostial ramus intermedius, and moderate (50-70%) stenosis of large acute marginal. Patient is scheduled for LHC today (6/12) with possible PCI, though extensive CAD may require CABG. - Cardiology consulted; appreciate recs - Follow-up LHC - Telemetry   #Food regurgitation Patient presents with 1-year of food regurgitation occurring 30-60 minutes after meals. Outpatient GI workup with H pylori breath testing, endoscopy, colonoscopy, and gastric emptying study has been largely negative, making conditions like IBD, celiac disease, PUD, gastritis, gastroparesis, gastric outlet obstruction, and esophageal strictures less likely. Esophagram this admission normal, making conditions like  achalasia, Zenker's diverticulum, and esophageal dysmotility less likely. Suspect symptoms are secondary to  GERD vs extensive CAD. Will continue pantoprazole  40 mg daily in place of home omeprazole  20 mg daily due to potential interactions with clopidogrel . Will hold off on further workup while continuing to monitor as CAD is treated as above. - Pantoprazole  40 mg daily - Discontinue home omeprazole  20 mg daily   #Hypertension Blood pressure appropriately decreasing after restarting home amlodipine  and lisinopril . Will increase carvedilol to 6.25 mg BID today and continue holding home atenolol  and hydrochlorothiazide  per cardiology. - Increase carvedilol to 6.25 mg BID - Home amlodipine  10 mg daily - Home lisinopril  20 mg daily - Hold home atenolol  25 mg daily - Hold home hydrochlorothiazide  25 mg daily   #Hx of Left Pontine Stroke Patient reports taking clopidogrel  75 mg daily at home, but he was not currently taking a statin. Repeat lipid panel this admission with LDL 91, HDL 43, TG 66, and total cholesterol 147. Will continue atorvastatin  40 mg daily and plan to repeat lipid panel and ALT outpatient in 6 weeks. LDL goal <55 given extent of CAD. - Home clopidogrel  75 mg daily -> will hold if CABG needed - Atorvastatin  40 mg daily - Repeat lipid panel and ALT outpatient in 6 weeks   #T2DM Patient reports taking metformin  500 mg BID at home. Repeat HgbA1c 6.9% this admission. Will hold metformin  for now given upcoming procedures but continue SSI with meals. - Hold home metformin  500 mg BID - SSI with meals - CBG   #Medication assistance At PCP appointment in 07/2023, patient reported taking medications sparingly to make them last longer. He also reported not taking any medications for the 2 weeks prior to admission, which correlated with his dispense history of last medication fill date of 07/31/2023. TOC and case management were consulted and educated patient about $4 medication copay with Medicaid, which he states he can afford.  Attestation for Student Documentation:  I personally was  present and re-performed the history, physical exam and medical decision-making activities of this service and have verified that the service and findings are accurately documented in the student's note.   Fay Hoop, MD 01/25/2024, 1:50 PM    LOS: 0 days   Elmira Haddock, Medical Student 01/25/2024, 12:48 PM

## 2024-01-25 NOTE — H&P (View-Only) (Signed)
 Progress Note  Patient Name: Zachary [name] Date of Encounter: 01/25/2024  [facility] Cardiologist: Gaylyn Keas, MD   Patient Profile     Subjective   Denies CP or SOB. Coronary CTA yesterday demonstrated Cor cal score of 1442, 70-99% mid LAD/D1/OM1/ostial Ramus, 50-70% ARVB.  Now NPO for cath today  Inpatient Medications    Scheduled Meds:  amLODipine   10 mg Oral Daily   atorvastatin   40 mg Oral Daily   carvedilol  3.125 mg Oral BID   clopidogrel   75 mg Oral Daily   enoxaparin (LOVENOX) injection  40 mg Subcutaneous Q24H   feeding supplement  237 mL Oral BID BM   insulin aspart  0-9 Units Subcutaneous TID WC   lisinopril   20 mg Oral Daily   metFORMIN   500 mg Oral BID   pantoprazole   40 mg Oral Daily   Continuous Infusions:  PRN Meds:    Vital Signs    Vitals:   01/24/24 1934 01/24/24 2332 01/25/24 0625 01/25/24 0713  BP: (!) 125/95 (!) 121/91  (!) 135/96  Pulse: 69 63 81 78  Resp: (!) 23 20  18   Temp: 98.1 F (36.7 C) 98.3 F (36.8 C) 98.4 F (36.9 C) 98.8 F (37.1 C)  TempSrc: Oral Oral Oral Oral  SpO2:    99%  Weight:   81.1 kg   Height:        Intake/Output Summary (Last 24 hours) at 01/25/2024 0755 Last data filed at 01/25/2024 7829 Gross per 24 hour  Intake 240 ml  Output --  Net 240 ml      01/25/2024    6:25 AM 01/24/2024    6:17 AM 01/23/2024    8:28 AM  Last 3 Weights  Weight (lbs) 178 lb 12.8 oz 179 lb 1.6 oz 175 lb  Weight (kg) 81.103 kg 81.239 kg 79.379 kg      Telemetry    NSR- Personally Reviewed  ECG    No new EKG to review- Personally Reviewed  Physical Exam   GEN: Well nourished, well developed in no acute distress HEENT: Normal NECK: No JVD; No carotid bruits LYMPHATICS: No lymphadenopathy CARDIAC:RRR, no murmurs, rubs, gallops RESPIRATORY:  Clear to auscultation without rales, wheezing or rhonchi  ABDOMEN: Soft, non-tender, non-distended MUSCULOSKELETAL:  No edema; No deformity  SKIN: Warm and  dry NEUROLOGIC:  Alert and oriented x 3 PSYCHIATRIC:  Normal affect  Labs    High Sensitivity Troponin:   Recent Labs  Lab 01/23/24 0925 01/23/24 1454  TROPONINIHS 126* 125*      Chemistry Recent Labs  Lab 01/23/24 0847 01/25/24 0232  NA 138 136  K 4.1 3.7  CL 101 102  CO2 27 24  GLUCOSE 126* 89  BUN 7 13  CREATININE 1.10 1.00  CALCIUM  9.2 9.2  PROT 7.2  --   ALBUMIN 3.5  --   AST 16  --   ALT 11  --   ALKPHOS 81  --   BILITOT 0.8  --   GFRNONAA >60 >60  ANIONGAP 10 10     Hematology Recent Labs  Lab 01/23/24 0847  WBC 6.4  RBC 5.56  HGB 15.1  HCT 46.9  MCV 84.4  MCH 27.2  MCHC 32.2  RDW 12.3  PLT 361    BNP Recent Labs  Lab 01/23/24 0925  BNP 77.0     DDimer  Recent Labs  Lab 01/23/24 1106  DDIMER 0.38     Radiology  Patient Profile     53 y.o. male  with a hx of CVA ( left pontine and right paramedian pontine 2018), type 2 diabetes, hypertension and adjustment disorder who is being seen 01/23/2024 for the evaluation of elevated troponin at the request of [name], MD.   Assessment & Plan    #Elevated troponin #Exertional fatigue #Indigestion symptoms with atypical chest pain - He has been having the symptoms for over 6 months but just recently started having chest pain after having episodes of regurgitation and chest pain resolves with vomiting -He also has noted exertional intolerance - EGD and gastric emptying study were normal by GI - EKG is concerning for prior anterior and inferior infarcts - High-sensitivity troponin 126 and 125 - Echo this admit: EF 55 to 60% with mild LVH, G1 DD, small inferior apical wall motion abnormality confirmed on strain imaging - Coronary CTA yesterday demonstrated Cor cal score of 1442, 70-99% mid LAD/D1/OM1/ostial Ramus, 50-70% ARVB. -he is NPO for cath today with possible PCI although with degree of CAD may need CABG -hold metformin  for now   #Hypertension -Poorly controlled due to  medical noncompliance>> has not taken blood pressure medicine since December 24 -BP improved after starting blood pressure medication although overnight it was elevated -Apparently they have some been some cost issues with being able to afford his medications -His medical regimen PTA was supposed to be atenolol  25 mg daily, amlodipine  10 mg daily, lisinopril  HCT 20-25 mg daily -He also has not been able to keep his medicines down recently because of constant episodes of nausea and vomiting -BP 135/64mmHg this am -continue Lisinopril  20mg  daily and Amlodipine  10mg  daily -increase Carvedilol to 6.25mg  BID   #Hyperlipidemia - LDL 91, HDL 43 this admit  - Patient was not taking medications at home and he has now been restarted on atorvastatin  40 mg daily  - would aim for LDL goal<55 given extent of his CAD - continue Atorvastatin  40mg  daily - He will need an FLP and ALT in 6 weeks   #History of CVA -Currently on Plavix  since 2018 -Change current omeprazole  to Protonix  40 mg daily which does not have an interaction with Plavix  -continue Plavix  75mg  daily   #Dysphagia #Nausea #Vomiting - suspect this was actually due to extensive CAD - Has had an extensive workup including EGD and gastric emptying study - Continue Protonix  40 mg daily      For questions or updates, please contact [facility] Please consult [email or web address] for contact info under        Signed, Gaylyn Keas, MD  01/25/2024, 7:55 AM

## 2024-01-25 NOTE — Progress Notes (Signed)
 PHARMACY - ANTICOAGULATION CONSULT NOTE  Pharmacy Consult for Heparin Indication: chest pain/ACS  No Known Allergies  Patient Measurements: Height: 5' 10 (177.8 cm) Weight: 81.1 kg (178 lb 12.8 oz) IBW/kg (Calculated) : 73 HEPARIN DW (KG): 81.2  Vital Signs: Temp: 97.8 F (36.6 C) (06/12 1931) Temp Source: Oral (06/12 1931) BP: 145/93 (06/12 1912) Pulse Rate: 0 (06/12 1912)  Labs: Recent Labs    01/23/24 0847 01/23/24 0925 01/23/24 1454 01/25/24 0232  HGB 15.1  --   --   --   HCT 46.9  --   --   --   PLT 361  --   --   --   CREATININE 1.10  --   --  1.00  TROPONINIHS  --  126* 125*  --     Estimated Creatinine Clearance: 87.2 mL/min (by C-G formula based on SCr of 1 mg/dL).   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 TCTS consult pending. Pharmacy consulted to dose IV heparin 2hr post TR band removal.  Goal of Therapy:  Heparin level 0.3-0.7 units/ml Monitor platelets by anticoagulation protocol: Yes   Plan:  2hr after TR band removal, begin IV heparin 1000 units/hr (no bolus) Check heparin level in 8hr and daily Continue to monitor H&H and platelets F/u TCTS consult  Armanda Bern, PharmD, BCPS 01/25/2024,7:44 PM  Please check AMION for all [facility] Pharmacy phone numbers After 10:00 PM, call Main Pharmacy [phone]

## 2024-01-25 NOTE — Progress Notes (Signed)
 SLP Cancellation Note  Patient Details Name: SHUNSUKE GRANZOW MRN: [id number] DOB: 1970-11-04   Cancelled treatment:       Reason Eval/Treat Not Completed: Other (comment). Esophagram without significant finding. Education completed with pt yesterday. No further SLP interventions needed.    Asiyah Pineau, Hardin Leys 01/25/2024, 9:05 AM

## 2024-01-25 NOTE — Progress Notes (Signed)
 Hypoglycemic Event  CBG: 67  Treatment: D50 25 mL (12.5 gm)  Symptoms: Hungry  Follow-up CBG: Time:1638 CBG Result:101  Possible Reasons for Event: Other: NPO  Comments/MD notified:    [employer]

## 2024-01-25 NOTE — Interval H&P Note (Signed)
 History and Physical Interval Note:  01/25/2024 6:42 PM  Zachary [name]  has presented today for surgery, with the diagnosis of atypical chest pain, elevated troponin, and abnormal coronary CTA.  The various methods of treatment have been discussed with the patient and family. After consideration of risks, benefits and other options for treatment, the patient has consented to  Procedure(s): LEFT HEART CATH AND CORONARY ANGIOGRAPHY (N/A) as a surgical intervention.  The patient's history has been reviewed, patient examined, no change in status, stable for surgery.  I have reviewed the patient's chart and labs.  Questions were answered to the patient's satisfaction.    Cath Lab Visit (complete for each Cath Lab visit)  Clinical Evaluation Leading to the Procedure:   ACS: Yes.    Non-ACS:  N/A  [name]

## 2024-01-25 NOTE — Plan of Care (Signed)

## 2024-01-26 ENCOUNTER — Encounter (HOSPITAL_COMMUNITY): Payer: Self-pay | Admitting: Internal Medicine

## 2024-01-26 DIAGNOSIS — R7989 Other specified abnormal findings of blood chemistry: Secondary | ICD-10-CM | POA: Diagnosis present

## 2024-01-26 DIAGNOSIS — E119 Type 2 diabetes mellitus without complications: Secondary | ICD-10-CM

## 2024-01-26 DIAGNOSIS — I10 Essential (primary) hypertension: Secondary | ICD-10-CM

## 2024-01-26 DIAGNOSIS — I252 Old myocardial infarction: Secondary | ICD-10-CM | POA: Diagnosis not present

## 2024-01-26 DIAGNOSIS — Z79899 Other long term (current) drug therapy: Secondary | ICD-10-CM | POA: Diagnosis not present

## 2024-01-26 DIAGNOSIS — Z7902 Long term (current) use of antithrombotics/antiplatelets: Secondary | ICD-10-CM | POA: Diagnosis not present

## 2024-01-26 DIAGNOSIS — Z951 Presence of aortocoronary bypass graft: Secondary | ICD-10-CM | POA: Diagnosis not present

## 2024-01-26 DIAGNOSIS — K219 Gastro-esophageal reflux disease without esophagitis: Secondary | ICD-10-CM | POA: Diagnosis present

## 2024-01-26 DIAGNOSIS — Z8673 Personal history of transient ischemic attack (TIA), and cerebral infarction without residual deficits: Secondary | ICD-10-CM

## 2024-01-26 DIAGNOSIS — I69354 Hemiplegia and hemiparesis following cerebral infarction affecting left non-dominant side: Secondary | ICD-10-CM | POA: Diagnosis not present

## 2024-01-26 DIAGNOSIS — R0789 Other chest pain: Secondary | ICD-10-CM | POA: Diagnosis present

## 2024-01-26 DIAGNOSIS — Z7984 Long term (current) use of oral hypoglycemic drugs: Secondary | ICD-10-CM | POA: Diagnosis not present

## 2024-01-26 DIAGNOSIS — I160 Hypertensive urgency: Secondary | ICD-10-CM | POA: Diagnosis present

## 2024-01-26 DIAGNOSIS — E7800 Pure hypercholesterolemia, unspecified: Secondary | ICD-10-CM | POA: Diagnosis present

## 2024-01-26 DIAGNOSIS — Z91128 Patient's intentional underdosing of medication regimen for other reason: Secondary | ICD-10-CM | POA: Diagnosis not present

## 2024-01-26 DIAGNOSIS — R079 Chest pain, unspecified: Secondary | ICD-10-CM | POA: Diagnosis present

## 2024-01-26 DIAGNOSIS — Z7982 Long term (current) use of aspirin: Secondary | ICD-10-CM | POA: Diagnosis not present

## 2024-01-26 DIAGNOSIS — I472 Ventricular tachycardia, unspecified: Secondary | ICD-10-CM | POA: Diagnosis present

## 2024-01-26 DIAGNOSIS — I25119 Atherosclerotic heart disease of native coronary artery with unspecified angina pectoris: Secondary | ICD-10-CM | POA: Diagnosis present

## 2024-01-26 DIAGNOSIS — I2510 Atherosclerotic heart disease of native coronary artery without angina pectoris: Secondary | ICD-10-CM | POA: Diagnosis not present

## 2024-01-26 DIAGNOSIS — Z91148 Patient's other noncompliance with medication regimen for other reason: Secondary | ICD-10-CM | POA: Diagnosis not present

## 2024-01-26 DIAGNOSIS — Z8249 Family history of ischemic heart disease and other diseases of the circulatory system: Secondary | ICD-10-CM | POA: Diagnosis not present

## 2024-01-26 DIAGNOSIS — R1110 Vomiting, unspecified: Secondary | ICD-10-CM | POA: Diagnosis not present

## 2024-01-26 DIAGNOSIS — I25118 Atherosclerotic heart disease of native coronary artery with other forms of angina pectoris: Secondary | ICD-10-CM

## 2024-01-26 LAB — CBC
HCT: 44.8 % (ref 39.0–52.0)
Hemoglobin: 15 g/dL (ref 13.0–17.0)
MCH: 27.6 pg (ref 26.0–34.0)
MCHC: 33.5 g/dL (ref 30.0–36.0)
MCV: 82.5 fL (ref 80.0–100.0)
Platelets: 336 10*3/uL (ref 150–400)
RBC: 5.43 MIL/uL (ref 4.22–5.81)
RDW: 12.5 % (ref 11.5–15.5)
WBC: 6.8 10*3/uL (ref 4.0–10.5)
nRBC: 0 % (ref 0.0–0.2)

## 2024-01-26 LAB — GLUCOSE, CAPILLARY
Glucose-Capillary: 120 mg/dL — ABNORMAL HIGH (ref 70–99)
Glucose-Capillary: 122 mg/dL — ABNORMAL HIGH (ref 70–99)
Glucose-Capillary: 120 mg/dL — ABNORMAL HIGH (ref 70–99)
Glucose-Capillary: 126 mg/dL — ABNORMAL HIGH (ref 70–99)

## 2024-01-26 LAB — HEPARIN LEVEL (UNFRACTIONATED)
Heparin Unfractionated: 0.38 [IU]/mL (ref 0.30–0.70)
Heparin Unfractionated: 0.53 [IU]/mL (ref 0.30–0.70)

## 2024-01-26 LAB — BASIC METABOLIC PANEL WITH GFR
Anion gap: 10 (ref 5–15)
BUN: 12 mg/dL (ref 6–20)
CO2: 24 mmol/L (ref 22–32)
Calcium: 8.8 mg/dL — ABNORMAL LOW (ref 8.9–10.3)
Chloride: 103 mmol/L (ref 98–111)
Creatinine, Ser: 0.96 mg/dL (ref 0.61–1.24)
GFR, Estimated: >60 mL/min (ref >60)
Glucose, Bld: 115 mg/dL — ABNORMAL HIGH (ref 70–99)
Potassium: 3.7 mmol/L (ref 3.5–5.1)
Sodium: 137 mmol/L (ref 135–145)

## 2024-01-26 MED ORDER — HEPARIN (PORCINE) 25000 UT/250ML-% IV SOLN
1000.0000 [IU]/h | INTRAVENOUS | Status: DC
  Administered 2024-01-26 – 2024-01-28 (×3): 1000 [IU]/h via INTRAVENOUS
  Filled 2024-01-26 (×3): qty 250

## 2024-01-26 MED ORDER — SENNOSIDES-DOCUSATE SODIUM 8.6-50 MG PO TABS
1.0000 | ORAL_TABLET | Freq: Two times a day (BID) | ORAL | Status: DC
  Administered 2024-01-26 – 2024-01-28 (×5): 1 via ORAL
  Filled 2024-01-26 (×5): qty 1

## 2024-01-26 MED ORDER — CARVEDILOL 12.5 MG PO TABS
12.5000 mg | ORAL_TABLET | Freq: Two times a day (BID) | ORAL | Status: DC
  Administered 2024-01-26 – 2024-01-28 (×4): 12.5 mg via ORAL
  Filled 2024-01-26 (×4): qty 1

## 2024-01-26 MED ORDER — CARVEDILOL 6.25 MG PO TABS
6.2500 mg | ORAL_TABLET | Freq: Once | ORAL | Status: AC
  Administered 2024-01-26: 6.25 mg via ORAL
  Filled 2024-01-26: qty 1

## 2024-01-26 MED ORDER — ATORVASTATIN CALCIUM 80 MG PO TABS
80.0000 mg | ORAL_TABLET | Freq: Every day | ORAL | Status: DC
  Administered 2024-01-27 – 2024-01-28 (×2): 80 mg via ORAL
  Filled 2024-01-26 (×2): qty 1

## 2024-01-26 MED ORDER — POLYETHYLENE GLYCOL 3350 17 G PO PACK
17.0000 g | PACK | Freq: Every day | ORAL | Status: DC
  Administered 2024-01-26 – 2024-01-27 (×2): 17 g via ORAL
  Filled 2024-01-26 (×3): qty 1

## 2024-01-26 NOTE — Progress Notes (Signed)
 PHARMACY - ANTICOAGULATION CONSULT NOTE  Pharmacy Consult for Heparin  Indication: chest pain/ACS  No Known Allergies  Patient Measurements: Height: 5' 10 (177.8 cm) Weight: 79.9 kg (176 lb 2.4 oz) IBW/kg (Calculated) : 73 HEPARIN  DW (KG): 81.2  Vital Signs: Temp: 98.5 F (36.9 C) (06/13 1114) Temp Source: Oral (06/13 1114) BP: 132/103 (06/13 1114) Pulse Rate: 74 (06/13 1114)  Labs: Recent Labs    01/23/24 1454 01/25/24 0232 01/26/24 0252 01/26/24 1112  HGB  --   --  15.0  --   HCT  --   --  44.8  --   PLT  --   --  336  --   HEPARINUNFRC  --   --   --  0.38  CREATININE  --  1.00 0.96  --   TROPONINIHS 125*  --   --   --     Estimated Creatinine Clearance: 89.8 mL/min (by C-G formula based on SCr of 0.96 mg/dL).   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 90 tablet 1 Past Month   atenolol  (TENORMIN ) 25 MG tablet Take 1 tablet (25 mg total) by mouth daily. 90 tablet 0 Past Month   clopidogrel  (PLAVIX ) 75 MG tablet Take 1 tablet (75 mg total) by mouth daily. 90 tablet 1 Past Month   lisinopril -hydrochlorothiazide  (ZESTORETIC ) 20-25 MG tablet Take 1 tablet by mouth daily. 90 tablet 1 Past Month   metFORMIN  (GLUCOPHAGE ) 500 MG tablet Take 2 tablets (1,000 mg total) by mouth 2 (two) times daily with a meal. 360 tablet 0 Past Month   omeprazole  (PRILOSEC) 20 MG capsule Take 1 capsule (20 mg total) by mouth daily. 30 capsule 3 Past Month   BD ULTRA-FINE LANCETS lancets Use as instructed 100 each 12    blood glucose meter kit and supplies KIT Dispense based on patient and insurance preference. Use up to four times daily as directed. (FOR ICD-9 250.00, 250.01). 1 each 0     Blood Glucose Monitoring Suppl (TRUE METRIX METER) w/Device KIT 1 each by Does not apply route 3 (three) times daily. 1 kit 0    glucose blood (TRUE METRIX BLOOD GLUCOSE TEST) test strip Use as instructed 100 each 12    TRUEplus Lancets 28G MISC Use as instructed. Check blood glucose level by fingerstick twice per day. 100 each 3    Scheduled:   amLODipine   10 mg Oral Daily   aspirin  EC  81 mg Oral Daily   [START ON 01/27/2024] atorvastatin   80 mg Oral Daily   carvedilol   12.5 mg Oral BID   feeding supplement  237 mL Oral BID BM   insulin  aspart  0-9 Units Subcutaneous TID WC   lisinopril   20 mg Oral Daily   pantoprazole   40 mg Oral Daily   polyethylene glycol  17 g Oral Daily   senna-docusate  1 tablet Oral BID   sodium chloride  flush  3 mL Intravenous Q12H   Infusions:   sodium chloride      heparin  1,000 Units/hr (01/26/24 1104)    Assessment: 53 yo male s/p cath that revealed severe three-vessel disease. Heparin  started 6/13 0236 hour. Of note, two instances of short heparin  pauses  during infusion. Heparin  level returned 6/13 1118 hour as 0.38 units/ml (in target range 0.3-0.7 units/ml). CBCs are within normal limits.   Goal of Therapy:  Heparin  level 0.3-0.7 units/ml Monitor platelets by anticoagulation protocol: Yes   Plan:  Continue heparin  1000 units/hr (no bolus) Check heparin  level in 8 hr and daily Continue to monitor H&H and platelets  Zachary [name] 01/26/2024,12:54 PM

## 2024-01-26 NOTE — H&P (View-Only) (Signed)
 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  He presented to the ED on 01/23/2024 with complaints of N/V for a year.  The patient states it occurs almost every time he eats and feels like it is getting worse.  He states they had not been able to identify a cause from my stomach, so maybe it's my heart and he presented for evaluation.  Upon further questioning the patient denied chest pain at time of presentation but states he tends to experience chest pain shortly after vomiting episodes.  Workup in ED showed no cardiopulmonary abnormalities on CXR.  His troponin level was minimally elevated, labs were unremarkable, EKG showed evidence of previous MI, however nothing acute.  He was noted to be non-compliant with blood pressure medications.  Echocardiogram obtained showed normal EF with small inferior apical wall motion abnormality.  Coronary CTA was also obtained and showed a score of 1442 with LAD, Diagonal, OM, and Ramus disease.  Due to this he underwent catheterization by Dr. Nolan Battle which revealed 3V CAD with no LM involvement, chronically occluded LAD which filled via collaterals.  It was felt he would benefit from coronary bypass grafting procedure and Cardiothoracic surgical consultation has been requested. Currently the patient denies chest pain, shortness of breath, and further N/V.  He is a non smoker.  He denies family history of CAD.  He is able to get around without difficulty.     Current Activity/ Functional Status: Patient is independent with mobility/ambulation, transfers, ADL's, IADL's.   Zubrod Score: At the time of surgery this patient's most appropriate activity status/level should be described as: []     0    Normal activity, no symptoms []     1    Restricted in physical strenuous activity but ambulatory, able to do out light work []     2    Ambulatory and capable of self care, unable to do work activities, up and about                 more than 50%  Of the time                            []     3    Only limited self care, in bed greater than 50% of waking hours []     4    Completely disabled, no self care, confined to bed or chair []     5    Moribund  Past Medical History:  Diagnosis Date   Diabetes mellitus without complication (HCC)    Hypertension    Stroke (HCC) 05/2017   Right Pontine Stroke    Past Surgical History:  Procedure Laterality Date   LEFT HEART CATH AND CORONARY ANGIOGRAPHY N/A 01/25/2024   Procedure: LEFT HEART CATH AND  CORONARY ANGIOGRAPHY;  Surgeon: Sammy Crisp, MD;  Location: [facility];  Service: Cardiovascular;  Laterality: N/A;    Social History   Tobacco Use  Smoking Status Never  Smokeless Tobacco Never    Social History   Substance and Sexual Activity  Alcohol Use Not Currently   Comment: occ     No Known Allergies  Current Facility-Administered Medications  Medication Dose Route Frequency Provider Last Rate Last Admin   0.9 %  sodium chloride  infusion  250 mL Intravenous PRN End, Veryl Gottron, MD       acetaminophen  (TYLENOL ) tablet 650 mg  650 mg Oral Q4H PRN End, Veryl Gottron, MD       amLODipine  (NORVASC ) tablet 10 mg  10 mg Oral Daily Mabel Savage, PA-C   10 mg at 01/26/24 0849   aspirin  EC tablet 81 mg  81 mg Oral Daily McLendon, Michael, MD   81 mg at 01/26/24 0849   atorvastatin  (LIPITOR) tablet 40 mg  40 mg Oral Daily Mabel Savage, PA-C   40 mg at 01/26/24 6295   carvedilol   (COREG ) tablet 6.25 mg  6.25 mg Oral BID Jacqueline Matsu, MD   6.25 mg at 01/26/24 0849   feeding supplement (ENSURE PLUS HIGH PROTEIN) liquid 237 mL  237 mL Oral BID BM Cherylene Corrente, MD       heparin  ADULT infusion 100 units/mL (25000 units/250mL)  1,000 Units/hr Intravenous Continuous Delorse Fey, RPH 10 mL/hr at 01/26/24 0236 1,000 Units/hr at 01/26/24 0236   insulin  aspart (novoLOG ) injection 0-9 Units  0-9 Units Subcutaneous TID WC Cleven Dallas, DO       lisinopril  (ZESTRIL ) tablet 20 mg  20 mg Oral Daily Willis Harter N, PA-C   20 mg at 01/26/24 2841   ondansetron  (ZOFRAN ) injection 4 mg  4 mg Intravenous Q6H PRN End, Veryl Gottron, MD       pantoprazole  (PROTONIX ) EC tablet 40 mg  40 mg Oral Daily Cleven Dallas, DO   40 mg at 01/26/24 0849   sodium chloride  flush (NS) 0.9 % injection 3 mL  3 mL Intravenous Q12H End, Christopher, MD       sodium chloride  flush (NS) 0.9 % injection 3 mL  3 mL Intravenous PRN End, Veryl Gottron, MD        Medications Prior to Admission  Medication Sig Dispense Refill Last Dose/Taking   amLODipine  (NORVASC ) 10 MG tablet Take 1 tablet (10 mg total) by mouth daily. 90 tablet 1 Past Month   atenolol  (TENORMIN ) 25 MG tablet Take 1 tablet (25 mg total) by mouth daily. 90 tablet 0 Past Month   clopidogrel  (PLAVIX ) 75 MG tablet Take 1 tablet (75 mg total) by mouth daily. 90 tablet 1 Past Month   lisinopril -hydrochlorothiazide  (ZESTORETIC ) 20-25 MG tablet Take 1 tablet by mouth daily. 90 tablet 1 Past Month   metFORMIN  (GLUCOPHAGE ) 500 MG tablet Take 2 tablets (1,000 mg total) by mouth 2 (two) times daily with a meal. 360 tablet 0 Past Month   omeprazole  (PRILOSEC) 20 MG capsule Take 1 capsule (20 mg total) by mouth daily. 30 capsule 3 Past Month   BD ULTRA-FINE LANCETS lancets Use as instructed 100 each 12    blood glucose meter kit and supplies KIT Dispense based on patient and insurance preference. Use up to four times daily as directed. (FOR ICD-9  250.00, 250.01). 1 each 0    Blood Glucose Monitoring Suppl (TRUE METRIX METER) w/Device KIT 1 each by Does not  apply route 3 (three) times daily. 1 kit 0    glucose blood (TRUE METRIX BLOOD GLUCOSE TEST) test strip Use as instructed 100 each 12    TRUEplus Lancets 28G MISC Use as instructed. Check blood glucose level by fingerstick twice per day. 100 each 3     Family History  Problem Relation Age of Onset   Hypertension Mother      Review of Systems:   ROS    Cardiac Review of Systems: Y or  [    ]= no  Chest Pain [ N   ]  Resting SOB [ N  ] Exertional SOB  [N  ]  Orthopnea [  ]   Pedal Edema Discordia.Diesel   ]    Palpitations [ N ] Syncope  [  ]   Presyncope [   ]  General Review of Systems: [Y] = yes [  ]=no Constitional: recent weight change [  ]; anorexia [  ]; fatigue [ Y ]; nausea [Y  ]; night sweats [  ]; fever [  ]; or chills [  ]                                                               Dental: Last Dentist visit:   Eye : blurred vision [  ]; diplopia [   ]; vision changes [  ];  Amaurosis fugax[  ]; Resp: cough [ N ];  wheezing[  ];  hemoptysis[  ]; shortness of breath[ N ]; paroxysmal nocturnal dyspnea[  ]; dyspnea on exertion[ N ]; or orthopnea[  ];  GI:  gallstones[  ], vomiting[  ];  dysphagia[  ]; melena[  ];  hematochezia [  ]; heartburn[  ];   Hx of  Colonoscopy[  ]; GU: kidney stones [  ]; hematuria[  ];   dysuria [  ];  nocturia[  ];  history of     obstruction [  ]; urinary frequency [  ]             Skin: rash, swelling[N  ];, hair loss[  ];  peripheral edema[ N ];  or itching[  ]; Musculosketetal: myalgias[  ];  joint swelling[  ];  joint erythema[  ];  joint pain[  ];  back pain[  ];  Heme/Lymph: bruising[  ];  bleeding[  ];  anemia[  ];  Neuro: TIA[  ];  headaches[  ];  stroke[ Y, residual left sided weakness ];  vertigo[  ];  seizures[  ];   paresthesias[  ];  difficulty walking[  ];  Psych:depression[  ]; anxiety[  ];  Endocrine: diabetes[ Y ];  thyroid  dysfunction[  ];   Physical Exam: BP (!) 131/95   Pulse 71   Temp 98.2 F (36.8 C) (Oral)   Resp 17   Ht 5' 10 (1.778 m)   Wt 79.9 kg   SpO2 98%   BMI 25.27 kg/m   General appearance: alert, cooperative, and no distress Head: Normocephalic, without obvious abnormality, atraumatic Neck: no adenopathy, no carotid bruit, no JVD, supple, symmetrical, trachea midline, and thyroid not enlarged, symmetric, no tenderness/mass/nodules Resp: clear to auscultation bilaterally Cardio: regular rate and rhythm GI: soft, non-tender; bowel sounds normal; no masses,  no organomegaly Extremities: extremities  normal, atraumatic, no cyanosis or edema Neurologic: Grossly normal, left sided weakness  Diagnostic Studies & Laboratory data:     Recent Radiology Findings:   CARDIAC CATHETERIZATION Result Date: 01/25/2024 Conclusions: Severe three-vessel CAD, as detailed below, including CTO's of mid LAD and distal RCA, as well as severe D1 and OM2 stenoses. Normal left ventricular filling pressure (LVEDP 10 mmHg). Recommendations: Discontinue clopidogrel  and start aspirin  and IV heparin . Cardiac surgery consultation for CABG. Aggressive secondary prevention of ASCVD. Sammy Crisp, MD [facility]  DG ESOPHAGUS W DOUBLE CM (HD) Result Date: 01/24/2024 CLINICAL DATA:  53 year old with complaint of regurgitation after meals. Barium swallow requested. EXAM: ESOPHAGUS/BARIUM SWALLOW/TABLET STUDY TECHNIQUE: Combined double and single contrast examination was performed using effervescent crystals, high-density barium, and thin liquid barium. This exam was performed by Quintin Buckle PA-C, and was supervised and interpreted by Creasie Doctor, MD. FLUOROSCOPY: Radiation Exposure Index (as provided by the fluoroscopic device): 17.1 mGy Kerma COMPARISON:  None Available. FINDINGS: Swallowing: Appears normal. No vestibular penetration or aspiration seen. Pharynx: Small volume residue post-swallow. Esophagus: Normal  appearance. Esophageal motility: Within normal limits. Hiatal Hernia: None. Gastroesophageal reflux: None visualized. Ingested 13mm barium tablet: Passed normally Other: None. IMPRESSION: Small volume residue in the vallecula. Otherwise unremarkable UGI series. Electronically Signed   By: Creasie Doctor M.D.   On: 01/24/2024 16:00   CT CORONARY MORPH W/CTA COR W/SCORE W/CA W/CM &/OR WO/CM Addendum Date: 01/24/2024 ADDENDUM REPORT: 01/24/2024 14:03 EXAM: OVER-READ INTERPRETATION  CT CHEST The following report is an over-read performed by radiologist Dr. Marjory Signs [facility] on 01/24/2024. This over-read does not include interpretation of cardiac or coronary anatomy or pathology. The coronary CTA interpretation by the cardiologist is attached. COMPARISON:  January 23, 2024 FINDINGS: Pulmonary Embolism: No pulmonary embolism. Cardiovascular: Dilation of the right atrium. No pericardial effusion. Extensive multi-vessel coronary atherosclerosis. Mediastinum/Nodes: Normal esophagus.No mediastinal mass. No mediastinal, hilar, or axillary lymphadenopathy. Lungs/Pleura: The midline trachea and bronchi are patent. No focal airspace consolidation, pleural effusion, or pneumothorax. Musculoskeletal: No acute fracture or destructive bone lesion. Upper Abdomen: No acute abnormality in the partially visualized upper abdomen. IMPRESSION: No pneumonia, pulmonary edema, or pleural effusion. Electronically Signed   By: Rance Burrows M.D.   On: 01/24/2024 14:03   Result Date: 01/24/2024 CLINICAL DATA:  53 Year-old Male EXAM: Cardiac/Coronary CTA TECHNIQUE: A non-contrast, gated CT scan was obtained with axial slices of 3 mm through the heart for calcium  scoring. Calcium  scoring was performed using the Agatston method. A 120 kV prospective, gated, contrast cardiac scan was obtained. Gantry rotation speed was 250 msecs and collimation was 0.6 mm. Two sublingual nitroglycerin  tablets (0.8 mg) were given. The 3D data  set was reconstructed in 5% intervals of the 35-75% of the R-R cycle. Diastolic phases were analyzed on a dedicated workstation using MPR, MIP, and VRT modes. The patient received 100 cc of contrast. FINDINGS: Coronary Arteries:  Normal coronary origin.  Right dominance. Coronary Calcium  Score: Left main: 0 Left anterior descending artery: 675 Left circumflex artery and RI: 525 Right coronary artery: 235 Total: 1442 Percentile: 99th for age, sex, and race matched control. Plaque Analysis: Unavailable Left main: The left main is a large caliber vessel with a normal take off from the left coronary cusp that trifurcates to form a left anterior descending artery, ramus intermedius, and a left circumflex artery. No significant plaque. Left anterior descending artery: The LAD is a large caliber vessel with one large D1 that bifurcates. There is  a 70-99% mixed plaque stenosis (long length) in the mid LAD with spotty calcification. Mild mixed plaques proximal LAD. Distal LAD not well evaluated. Severe mixed plaque stenosis (70-99%) of the medial D1 bifurcation. Left circumflex artery: The LCX is non-dominant with one large obtuse marginal anatomy that bifurcates. Minimal ostial disease. Mild mixed LCX plaques. Severe (70-99%) calcified ostial OM1 disease prior to the bifurcation- blooming artifact. Moderate mixed plaque distal LCX. Right coronary artery: The RCA is dominant with normal take off from the right coronary cusp. the RCA terminates as a PDA and right posterolateral branch. Mild mixed plaques in the proximal RCA. Minimal mixed plaques in the mid RCA. Severe mixed plaque stenosis ostium of the PLA. Moderate (50-70%) mixed plaque in the body of the large acute marginal. There is a ramus intermedius vessel with severe (70-99%) ostial calcification. Blooming artifact. Other non-coronary findings: Right Atrium: Right atrial size is dilated. Right Ventricle: The right ventricular cavity is mildly dilated. Left Atrium:  Left atrial size is normal in size with no left atrial appendage filling defect. Left Ventricle: The ventricular cavity size is within normal limits. Interatrial septum: Difficult imaging due to artifact Main Pulmonary Artery: Mild dilation, 30 mm. Pulmonary veins: Normal pulmonary venous drainage. Pericardium: Normal thickness without significant effusion or calcium  present. Cardiac valves: The aortic valve is trileaflet without significant calcification. The mitral valve is normal without significant calcification. Aorta: Normal caliber without significant calcifications Aortic atherosclerosis. Extra-cardiac findings: See attached radiology report for non-cardiac structures. Artifact: Slab Image quality: Fair IMPRESSION: 1. Coronary calcium  score of 1442. This was 99th percentile for age, sex, and race matched control. 2. Normal coronary origin with right dominance. 3. CAD-RADS 4a Severe stenosis with high risk plaque modifier. (70-99%). Cardiac catheterization may be considered. CT-FFR not available. Consider symptom-guided anti-ischemic pharmacotherapy as well as risk factor modification per guideline directed care. RECOMMENDATIONS: RECOMMENDATIONS The proposed cut-off value of 1,651 AU yielded a 93 % sensitivity and 75 % specificity in grading AS severity in patients with classical low-flow, low-gradient AS. Proposed different cut-off values to define severe AS for men and women as 2,065 AU and 1,274 AU, respectively. The joint European and American recommendations for the assessment of AS consider the aortic valve calcium  score as a continuum - a very high calcium  score suggests severe AS and a low calcium  score suggests severe AS is unlikely. Florette Hurry, et al. 2017 ESC/EACTS Guidelines for the management of valvular heart disease. Eur Heart J 2017;38:2739-91. Coronary artery calcium  (CAC) score is a strong predictor of incident coronary heart disease (CHD) and provides predictive  information beyond traditional risk factors. CAC scoring is reasonable to use in the decision to withhold, postpone, or initiate statin therapy in intermediate-risk or selected borderline-risk asymptomatic adults (age 28-75 years and LDL-C >=70 to <190 mg/dL) who do not have diabetes or established atherosclerotic cardiovascular disease (ASCVD).* In intermediate-risk (10-year ASCVD risk >=7.5% to <20%) adults or selected borderline-risk (10-year ASCVD risk >=5% to <7.5%) adults in whom a CAC score is measured for the purpose of making a treatment decision the following recommendations have been made: If CAC = 0, it is reasonable to withhold statin therapy and reassess in 5 to 10 years, as long as higher risk conditions are absent (diabetes mellitus, family history of premature CHD in first degree relatives (males <55 years; females <65 years), cigarette smoking, LDL >=190 mg/dL or other independent risk factors). If CAC is 1 to 99, it is reasonable to initiate statin  therapy for patients >=28 years of age. If CAC is >=100 or >=75th percentile, it is reasonable to initiate statin therapy at any age. Cardiology referral should be considered for patients with CAC scores =400 or >=75th percentile. *2018 AHA/ACC/AACVPR/AAPA/ABC/ACPM/ADA/AGS/APhA/ASPC/NLA/PCNA Guideline on the Management of Blood Cholesterol: A Report of the American College of Cardiology/American Heart Association Task Force on Clinical Practice Guidelines. J Am Coll Cardiol. 2019;73(24):3168-3209. Gloriann Larger, MD Electronically Signed: By: Gloriann Larger M.D. On: 01/24/2024 13:27     I have independently reviewed the above radiologic studies and discussed with the patient   Recent Lab Findings: Lab Results  Component Value Date   WBC 6.8 01/26/2024   HGB 15.0 01/26/2024   HCT 44.8 01/26/2024   PLT 336 01/26/2024   GLUCOSE 115 (H) 01/26/2024   CHOL 147 01/24/2024   TRIG 66 01/24/2024   HDL 43 01/24/2024   LDLCALC 91  01/24/2024   ALT 11 01/23/2024   AST 16 01/23/2024   NA 137 01/26/2024   K 3.7 01/26/2024   CL 103 01/26/2024   CREATININE 0.96 01/26/2024   BUN 12 01/26/2024   CO2 24 01/26/2024   TSH 2.446 01/23/2024   INR 0.96 03/18/2017   HGBA1C 6.6 (H) 01/24/2024      Assessment / Plan:      CAD, minimally elevated Troponin on presentation, atypical angina symptoms also.. Coronary CTA concerning for CAD, cath revealed 3V CAD with chronically occluded LAD, normal LM... requesting coronary bypass grafting procedure DM Type 2-pretty well controlled A1c is 6.6 HTN- poor compliance, not taking medications since December GI- persistent N/V post meals... felt to be his angina symptoms has GI workup has been negative H/O Pontine Stroke with residual weakness.. on Plavix  prior to admission. Last dose was 6/12  Dispo- patient stable, denies chest pain, shortness of breath, no further N/V. Currently on Heparin  drip.  Received Plavix  6/12 and will require washout prior to proceeding with surgical revascularization.  Dr. Micael Adas was in the room and is very apprehensive of discharging patient home prior to surgery.   Dr. Luna Salinas will evaluate patient and determine candidacy and timing of surgery likely late next week at the earliest.  I  spent 40 minutes counseling the patient face to face.   Zachary Kasal, PA-C 01/26/2024 9:07 AM  I have seen and examined Zachary [name].  I reviewed his records and his catheterization images.  Briefly he is a 53 year old man with a history of type 2 diabetes, hypertension, previous stroke, reflux, and adjustment mood disorder with anxiety.  He gives a history of regurgitating food after he eats.  He does not really have nausea or abdominal pain but says that about an hour after he eats he often will spit up his food.  He has had some chest discomfort with that.  He had an extensive GI workup which was unrevealing.  More recently he noted he is feeling more fatigued.  CTA showed  a calcium  score of 1442.  Cardiac catheterization revealed severe three-vessel coronary disease with a chronically totally occluded LAD.  Coronary bypass grafting is indicated for survival benefit.  It may or may not do anything to improve his postprandial regurgitation.  That has been speculated to be an anginal equivalent but it is very odd and would be highly unusual.  If this is truly an anginal equivalent that likely will get better after surgery but there is just no way to know that for sure.  His Allen's test is normal.  Await the vascular lab assessment but will likely use a left radial artery.  I discussed the general nature of the procedure, including the need for general anesthesia, the incisions to be used, the use of cardiopulmonary bypass, and the use of temporary pacemaker wires and drainage tubes postoperatively with Zachary [name] and his daughter.  We discussed the expected hospital stay, overall recovery and short and long term outcomes. I informed them of the indications, risks, benefits and alternatives.   He understands the risks include, but are not limited to death, stroke, MI, DVT/PE, bleeding, possible need for transfusion, infections, cardiac arrhythmias, as well as other organ system dysfunction including respiratory, renal, or GI complications.    Currently it appears the first potential opportunity for surgery would be next Friday, 02/02/2024.  Hopefully he can go home in the meantime and come back electively.  Milon Aloe Luna Salinas, MD [facility] [phone]

## 2024-01-26 NOTE — Progress Notes (Signed)
 TR band off, site clean dry and intact. Gauze and tegaderm applied.

## 2024-01-26 NOTE — Progress Notes (Signed)
 PHARMACY - ANTICOAGULATION CONSULT NOTE  Pharmacy Consult for Heparin  Indication: chest pain/ACS  No Known Allergies  Patient Measurements: Height: 5' 10 (177.8 cm) Weight: 79.9 kg (176 lb 2.4 oz) IBW/kg (Calculated) : 73 HEPARIN  DW (KG): 81.2  Vital Signs: Temp: 98.5 F (36.9 C) (06/13 1922) Temp Source: Oral (06/13 1922) BP: 119/91 (06/13 1922) Pulse Rate: 72 (06/13 1922)  Labs: Recent Labs    01/25/24 0232 01/26/24 0252 01/26/24 1112 01/26/24 2025  HGB  --  15.0  --   --   HCT  --  44.8  --   --   PLT  --  336  --   --   HEPARINUNFRC  --   --  0.38 0.53  CREATININE 1.00 0.96  --   --     Estimated Creatinine Clearance: 89.8 mL/min (by C-G formula based on SCr of 0.96 mg/dL).   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 90 tablet 1 Past Month   atenolol  (TENORMIN ) 25 MG tablet Take 1 tablet (25 mg total) by mouth daily. 90 tablet 0 Past Month   clopidogrel  (PLAVIX ) 75 MG tablet Take 1 tablet (75 mg total) by mouth daily. 90 tablet 1 Past Month   lisinopril -hydrochlorothiazide  (ZESTORETIC ) 20-25 MG tablet Take 1 tablet by mouth daily. 90 tablet 1 Past Month   metFORMIN  (GLUCOPHAGE ) 500 MG tablet Take 2 tablets (1,000 mg total) by mouth 2 (two) times daily with a meal. 360 tablet 0 Past Month   omeprazole  (PRILOSEC) 20 MG capsule Take 1 capsule (20 mg total) by mouth daily. 30 capsule 3 Past Month   BD ULTRA-FINE LANCETS lancets Use as instructed 100 each 12    blood glucose meter kit and supplies KIT Dispense based on patient and insurance preference. Use up to four times daily as directed. (FOR ICD-9 250.00, 250.01). 1 each 0    Blood Glucose Monitoring Suppl  (TRUE METRIX METER) w/Device KIT 1 each by Does not apply route 3 (three) times daily. 1 kit 0    glucose blood (TRUE METRIX BLOOD GLUCOSE TEST) test strip Use as instructed 100 each 12    TRUEplus Lancets 28G MISC Use as instructed. Check blood glucose level by fingerstick twice per day. 100 each 3    Scheduled:   amLODipine   10 mg Oral Daily   aspirin  EC  81 mg Oral Daily   [START ON 01/27/2024] atorvastatin   80 mg Oral Daily   carvedilol   12.5 mg Oral BID   feeding supplement  237 mL Oral BID BM   insulin  aspart  0-9 Units Subcutaneous TID WC   lisinopril   20 mg Oral Daily   pantoprazole   40 mg Oral Daily   polyethylene glycol  17 g Oral Daily   senna-docusate  1 tablet Oral BID   sodium chloride  flush  3 mL Intravenous Q12H   Infusions:   sodium chloride      heparin  1,000 Units/hr (01/26/24 1104)    Assessment: 53 yo male s/p cath that revealed severe three-vessel disease. Heparin  started 6/13 0236 hour. Of note, two instances of short heparin  pauses during infusion. Heparin  level returned 6/13 1118 hour as 0.38 units/ml (in target range  0.3-0.7 units/ml). CBCs are within normal limits.   6/13 PM - heparin  level therapeutic.  Goal of Therapy:  Heparin  level 0.3-0.7 units/ml Monitor platelets by anticoagulation protocol: Yes   Plan:  Continue heparin  1000 units/hr  Daily heparin  level, CBC Continue to monitor H&H and platelets  Cecillia Cogan, PharmD Clinical Pharmacist 01/26/2024  9:12 PM

## 2024-01-26 NOTE — Plan of Care (Signed)

## 2024-01-26 NOTE — Progress Notes (Addendum)
 Subjective:  Interval events: LHC with severe three-vessel CAD, so cardiac surgery was consulted for CABG evaluation. Patient was started on IV heparin  per pharmacy, Plavix  was switched to aspirin , and VTE prophylaxis was discontinued.   Patient is feeling well this morning. He was finally able to eat breakfast after being NPO all day yesterday. He denies any episodes of food regurgitation or chest pain. His residual left-sided weakness for the CVA continues to be bothersome when walking. He was happy to see his son and daughter yesterday when they visited for his birthday.  Objective:  Vital signs in last 24 hours: Vitals:   01/25/24 2345 01/26/24 0241 01/26/24 0418 01/26/24 0712  BP: (!) 132/90 119/81  (!) 137/98  Pulse: 75 78  77  Resp: 18 18  17   Temp: 97.6 F (36.4 C) 97.6 F (36.4 C)  98.2 F (36.8 C)  TempSrc: Oral Oral  Oral  SpO2: 99% 97%  98%  Weight:   79.9 kg   Height:       Weight change: -1.203 kg  Intake/Output Summary (Last 24 hours) at 01/26/2024 0751 Last data filed at 01/26/2024 0300 Gross per 24 hour  Intake 338.26 ml  Output --  Net 338.26 ml   General: Well-appearing male sitting comfortably on edge of bed, in NAD HENT: Normocephalic and atraumatic. Conjunctivae clear. No nasal congestion or rhinorrhea. Cardiovascular: Normal rate with regular rhythm. No murmurs, rubs, or gallops. Pulmonary: Normal respiratory effort on room air. Abdominal: Soft. Non-distended. No tenderness to palpation. Normal bowel sounds.  Neurological: Alert, responds appropriately to questions. Skin: Warm and dry.  Assessment/Plan:  Principal Problem:   Chest pain of uncertain etiology Active Problems:   Troponin level elevated   Gastroesophageal reflux disease without esophagitis   Hypertensive urgency   Pure hypercholesterolemia   History of CVA (cerebrovascular accident)   Regurgitation of food  [name] is a 53 year-old male with PMH of HTN, T2DM, and CVA  with residual left-sided deficits who presents for recurrent food regurgitation after meals and admitted on 01/23/2024 for cardiac workup in the setting of elevated troponin and atypical chest pain.    #Elevated Troponin #Atypical Chest Pain Patient presented with concerns for possible cardiac etiology of recurrent food regurgitation since outpatient GI evaluation has largely been negative. However, patient only experiences chest pain and palpitations during episodes of active regurgitation. He denies exertional chest pain or shortness of breath, though he has experienced exercise intolerance. Initial workup notable for elevated troponin (126->125) and EKG with new ST changes in II, III, aVF and T-wave inversions in V4-V6. Cardiology was consulted and recommended echocardiogram and coronary CTA. Echocardiogram showed EF 55 to 60% with mild LVH, grade 1 diastolic dysfunction, and small inferior apical wall motion abnormality confirmed on strain imaging. Coronary CT with coronary calcium  score of 1442 (99th percentile), severe (70-99%) stenosis in the mid LAD, medial D1 bifurcation, OM1, and ostial ramus intermedius, and moderate (50-70%) stenosis of large acute marginal. LHC with severe 3-vessel CAD, so cardiac surgery was consulted for CABG evaluation. Will follow-up on their recommendations.  - CABG per Cardiac surgery  - IV heparin  per pharmacy - Discontinue Lovenox  VTE prophylaxis - Continue telemetry    #Food regurgitation Reports food regurgitation occurring 30-60 minutes after meals for one year duration. Outpatient GI workup with H pylori breath testing, endoscopy, colonoscopy, and gastric emptying study has been largely negative, making conditions like IBD, celiac disease, PUD, gastritis, gastroparesis, gastric outlet obstruction, and esophageal strictures less  likely. Esophagram this admission normal, making conditions like achalasia, Zenker's diverticulum, and esophageal dysmotility less likely.  Suspect symptoms are secondary to GERD vs extensive CAD. Will continue pantoprazole  40 mg daily in place of home omeprazole  20 mg daily due to potential interactions with clopidogrel . Will hold off on further workup while continuing to monitor as CAD is treated as above. - Pantoprazole  40 mg daily - Ensures BID   #Hypertension Blood pressure appropriately decreasing after restarting home amlodipine  and lisinopril . Will increase carvedilol  to 12.5 mg BID today and continue holding home atenolol  and hydrochlorothiazide  per cardiology. - Increase carvedilol  to 12.5 mg BID - Continue amlodipine  10 mg daily - Continue  lisinopril  20 mg daily - Hold home atenolol  25 mg daily - Hold home hydrochlorothiazide  25 mg daily   #Hx of Left Pontine Stroke Patient reports taking clopidogrel  75 mg daily at home, but he was not currently taking a statin. Repeat lipid panel this admission with LDL 91, HDL 43, TG 66, and total cholesterol 147. Will increase atorvastatin  to 80 mg daily and plan to repeat lipid panel and ALT outpatient in 6 weeks. LDL goal <55 given extent of CAD. Will switch Plavix  to aspirin  given plan for CABG. - Hold home clopidogrel  75 mg daily  - Start aspirin  81 mg daily - Increase atorvastatin  to 80 mg daily - Repeat lipid panel and ALT outpatient in 6 weeks - PT consult before discharge   #T2DM Patient reports taking metformin  500 mg BID at home. Repeat HgbA1c 6.9% this admission. Will hold metformin  for now given upcoming procedures but continue SSI with meals. - Hold home metformin  500 mg BID - SSI with meals - CBG  Attestation for Student Documentation:  I personally was present and re-performed the history, physical exam and medical decision-making activities of this service and have verified that the service and findings are accurately documented in the student's note.  [name] was evaluated at the bedside today. He is asymptomatic and denies chest pain or shortness of  breath. Laboratory results were reviewed and are unremarkable. Cardiothoracic Surgery  is currently uncertain about the timing of CABG and is recommending discharge home until a surgical date is determined. Cardiology has expressed concern regarding the appropriateness of discharge prior to surgery. Dr. Luna Salinas is expected to re-evaluate [name] to help determine the optimal timing for CABG.  Fay Hoop, MD 01/26/2024, 1:47 PM    LOS: 0 days   Elmira Haddock, Medical Student 01/26/2024, 7:51 AM

## 2024-01-26 NOTE — Consult Note (Addendum)
 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  He presented to the ED on 01/23/2024 with complaints of N/V for a year.  The patient states it occurs almost every time he eats and feels like it is getting worse.  He states they had not been able to identify a cause from my stomach, so maybe it's my heart and he presented for evaluation.  Upon further questioning the patient denied chest pain at time of presentation but states he tends to experience chest pain shortly after vomiting episodes.  Workup in ED showed no cardiopulmonary abnormalities on CXR.  His troponin level was minimally elevated, labs were unremarkable, EKG showed evidence of previous MI, however nothing acute.  He was noted to be non-compliant with blood pressure medications.  Echocardiogram obtained showed normal EF with small inferior apical wall motion abnormality.  Coronary CTA was also obtained and showed a score of 1442 with LAD, Diagonal, OM, and Ramus disease.  Due to this he underwent catheterization by Dr. Nolan Battle which revealed 3V CAD with no LM involvement, chronically occluded LAD which filled via collaterals.  It was felt he would benefit from coronary bypass grafting procedure and Cardiothoracic surgical consultation has been requested. Currently the patient denies chest pain, shortness of breath, and further N/V.  He is a non smoker.  He denies family history of CAD.  He is able to get around without difficulty.     Current Activity/ Functional Status: Patient is independent with mobility/ambulation, transfers, ADL's, IADL's.   Zubrod Score: At the time of surgery this patient's most appropriate activity status/level should be described as: []     0    Normal activity, no symptoms []     1    Restricted in physical strenuous activity but ambulatory, able to do out light work []     2    Ambulatory and capable of self care, unable to do work activities, up and about                 more than 50%  Of the time                            []     3    Only limited self care, in bed greater than 50% of waking hours []     4    Completely disabled, no self care, confined to bed or chair []     5    Moribund  Past Medical History:  Diagnosis Date   Diabetes mellitus without complication (HCC)    Hypertension    Stroke (HCC) 05/2017   Right Pontine Stroke    Past Surgical History:  Procedure Laterality Date   LEFT HEART CATH AND CORONARY ANGIOGRAPHY N/A 01/25/2024   Procedure: LEFT HEART CATH AND  CORONARY ANGIOGRAPHY;  Surgeon: Sammy Crisp, MD;  Location: [facility];  Service: Cardiovascular;  Laterality: N/A;    Social History   Tobacco Use  Smoking Status Never  Smokeless Tobacco Never    Social History   Substance and Sexual Activity  Alcohol Use Not Currently   Comment: occ     No Known Allergies  Current Facility-Administered Medications  Medication Dose Route Frequency Provider Last Rate Last Admin   0.9 %  sodium chloride  infusion  250 mL Intravenous PRN End, Veryl Gottron, MD       acetaminophen  (TYLENOL ) tablet 650 mg  650 mg Oral Q4H PRN End, Veryl Gottron, MD       amLODipine  (NORVASC ) tablet 10 mg  10 mg Oral Daily Mabel Savage, PA-C   10 mg at 01/26/24 0849   aspirin  EC tablet 81 mg  81 mg Oral Daily McLendon, Michael, MD   81 mg at 01/26/24 0849   atorvastatin  (LIPITOR) tablet 40 mg  40 mg Oral Daily Mabel Savage, PA-C   40 mg at 01/26/24 6295   carvedilol   (COREG ) tablet 6.25 mg  6.25 mg Oral BID Jacqueline Matsu, MD   6.25 mg at 01/26/24 0849   feeding supplement (ENSURE PLUS HIGH PROTEIN) liquid 237 mL  237 mL Oral BID BM Cherylene Corrente, MD       heparin  ADULT infusion 100 units/mL (25000 units/250mL)  1,000 Units/hr Intravenous Continuous Delorse Fey, RPH 10 mL/hr at 01/26/24 0236 1,000 Units/hr at 01/26/24 0236   insulin  aspart (novoLOG ) injection 0-9 Units  0-9 Units Subcutaneous TID WC Cleven Dallas, DO       lisinopril  (ZESTRIL ) tablet 20 mg  20 mg Oral Daily Willis Harter N, PA-C   20 mg at 01/26/24 2841   ondansetron  (ZOFRAN ) injection 4 mg  4 mg Intravenous Q6H PRN End, Veryl Gottron, MD       pantoprazole  (PROTONIX ) EC tablet 40 mg  40 mg Oral Daily Cleven Dallas, DO   40 mg at 01/26/24 0849   sodium chloride  flush (NS) 0.9 % injection 3 mL  3 mL Intravenous Q12H End, Christopher, MD       sodium chloride  flush (NS) 0.9 % injection 3 mL  3 mL Intravenous PRN End, Veryl Gottron, MD        Medications Prior to Admission  Medication Sig Dispense Refill Last Dose/Taking   amLODipine  (NORVASC ) 10 MG tablet Take 1 tablet (10 mg total) by mouth daily. 90 tablet 1 Past Month   atenolol  (TENORMIN ) 25 MG tablet Take 1 tablet (25 mg total) by mouth daily. 90 tablet 0 Past Month   clopidogrel  (PLAVIX ) 75 MG tablet Take 1 tablet (75 mg total) by mouth daily. 90 tablet 1 Past Month   lisinopril -hydrochlorothiazide  (ZESTORETIC ) 20-25 MG tablet Take 1 tablet by mouth daily. 90 tablet 1 Past Month   metFORMIN  (GLUCOPHAGE ) 500 MG tablet Take 2 tablets (1,000 mg total) by mouth 2 (two) times daily with a meal. 360 tablet 0 Past Month   omeprazole  (PRILOSEC) 20 MG capsule Take 1 capsule (20 mg total) by mouth daily. 30 capsule 3 Past Month   BD ULTRA-FINE LANCETS lancets Use as instructed 100 each 12    blood glucose meter kit and supplies KIT Dispense based on patient and insurance preference. Use up to four times daily as directed. (FOR ICD-9  250.00, 250.01). 1 each 0    Blood Glucose Monitoring Suppl (TRUE METRIX METER) w/Device KIT 1 each by Does not  apply route 3 (three) times daily. 1 kit 0    glucose blood (TRUE METRIX BLOOD GLUCOSE TEST) test strip Use as instructed 100 each 12    TRUEplus Lancets 28G MISC Use as instructed. Check blood glucose level by fingerstick twice per day. 100 each 3     Family History  Problem Relation Age of Onset   Hypertension Mother      Review of Systems:   ROS    Cardiac Review of Systems: Y or  [    ]= no  Chest Pain [ N   ]  Resting SOB [ N  ] Exertional SOB  [N  ]  Orthopnea [  ]   Pedal Edema Discordia.Diesel   ]    Palpitations [ N ] Syncope  [  ]   Presyncope [   ]  General Review of Systems: [Y] = yes [  ]=no Constitional: recent weight change [  ]; anorexia [  ]; fatigue [ Y ]; nausea [Y  ]; night sweats [  ]; fever [  ]; or chills [  ]                                                               Dental: Last Dentist visit:   Eye : blurred vision [  ]; diplopia [   ]; vision changes [  ];  Amaurosis fugax[  ]; Resp: cough [ N ];  wheezing[  ];  hemoptysis[  ]; shortness of breath[ N ]; paroxysmal nocturnal dyspnea[  ]; dyspnea on exertion[ N ]; or orthopnea[  ];  GI:  gallstones[  ], vomiting[  ];  dysphagia[  ]; melena[  ];  hematochezia [  ]; heartburn[  ];   Hx of  Colonoscopy[  ]; GU: kidney stones [  ]; hematuria[  ];   dysuria [  ];  nocturia[  ];  history of     obstruction [  ]; urinary frequency [  ]             Skin: rash, swelling[N  ];, hair loss[  ];  peripheral edema[ N ];  or itching[  ]; Musculosketetal: myalgias[  ];  joint swelling[  ];  joint erythema[  ];  joint pain[  ];  back pain[  ];  Heme/Lymph: bruising[  ];  bleeding[  ];  anemia[  ];  Neuro: TIA[  ];  headaches[  ];  stroke[ Y, residual left sided weakness ];  vertigo[  ];  seizures[  ];   paresthesias[  ];  difficulty walking[  ];  Psych:depression[  ]; anxiety[  ];  Endocrine: diabetes[ Y ];  thyroid  dysfunction[  ];   Physical Exam: BP (!) 131/95   Pulse 71   Temp 98.2 F (36.8 C) (Oral)   Resp 17   Ht 5' 10 (1.778 m)   Wt 79.9 kg   SpO2 98%   BMI 25.27 kg/m   General appearance: alert, cooperative, and no distress Head: Normocephalic, without obvious abnormality, atraumatic Neck: no adenopathy, no carotid bruit, no JVD, supple, symmetrical, trachea midline, and thyroid not enlarged, symmetric, no tenderness/mass/nodules Resp: clear to auscultation bilaterally Cardio: regular rate and rhythm GI: soft, non-tender; bowel sounds normal; no masses,  no organomegaly Extremities: extremities  normal, atraumatic, no cyanosis or edema Neurologic: Grossly normal, left sided weakness  Diagnostic Studies & Laboratory data:     Recent Radiology Findings:   CARDIAC CATHETERIZATION Result Date: 01/25/2024 Conclusions: Severe three-vessel CAD, as detailed below, including CTO's of mid LAD and distal RCA, as well as severe D1 and OM2 stenoses. Normal left ventricular filling pressure (LVEDP 10 mmHg). Recommendations: Discontinue clopidogrel  and start aspirin  and IV heparin . Cardiac surgery consultation for CABG. Aggressive secondary prevention of ASCVD. Sammy Crisp, MD [facility]  DG ESOPHAGUS W DOUBLE CM (HD) Result Date: 01/24/2024 CLINICAL DATA:  53 year old with complaint of regurgitation after meals. Barium swallow requested. EXAM: ESOPHAGUS/BARIUM SWALLOW/TABLET STUDY TECHNIQUE: Combined double and single contrast examination was performed using effervescent crystals, high-density barium, and thin liquid barium. This exam was performed by Quintin Buckle PA-C, and was supervised and interpreted by Creasie Doctor, MD. FLUOROSCOPY: Radiation Exposure Index (as provided by the fluoroscopic device): 17.1 mGy Kerma COMPARISON:  None Available. FINDINGS: Swallowing: Appears normal. No vestibular penetration or aspiration seen. Pharynx: Small volume residue post-swallow. Esophagus: Normal  appearance. Esophageal motility: Within normal limits. Hiatal Hernia: None. Gastroesophageal reflux: None visualized. Ingested 13mm barium tablet: Passed normally Other: None. IMPRESSION: Small volume residue in the vallecula. Otherwise unremarkable UGI series. Electronically Signed   By: Creasie Doctor M.D.   On: 01/24/2024 16:00   CT CORONARY MORPH W/CTA COR W/SCORE W/CA W/CM &/OR WO/CM Addendum Date: 01/24/2024 ADDENDUM REPORT: 01/24/2024 14:03 EXAM: OVER-READ INTERPRETATION  CT CHEST The following report is an over-read performed by radiologist Dr. Marjory Signs [facility] on 01/24/2024. This over-read does not include interpretation of cardiac or coronary anatomy or pathology. The coronary CTA interpretation by the cardiologist is attached. COMPARISON:  January 23, 2024 FINDINGS: Pulmonary Embolism: No pulmonary embolism. Cardiovascular: Dilation of the right atrium. No pericardial effusion. Extensive multi-vessel coronary atherosclerosis. Mediastinum/Nodes: Normal esophagus.No mediastinal mass. No mediastinal, hilar, or axillary lymphadenopathy. Lungs/Pleura: The midline trachea and bronchi are patent. No focal airspace consolidation, pleural effusion, or pneumothorax. Musculoskeletal: No acute fracture or destructive bone lesion. Upper Abdomen: No acute abnormality in the partially visualized upper abdomen. IMPRESSION: No pneumonia, pulmonary edema, or pleural effusion. Electronically Signed   By: Rance Burrows M.D.   On: 01/24/2024 14:03   Result Date: 01/24/2024 CLINICAL DATA:  53 Year-old Male EXAM: Cardiac/Coronary CTA TECHNIQUE: A non-contrast, gated CT scan was obtained with axial slices of 3 mm through the heart for calcium  scoring. Calcium  scoring was performed using the Agatston method. A 120 kV prospective, gated, contrast cardiac scan was obtained. Gantry rotation speed was 250 msecs and collimation was 0.6 mm. Two sublingual nitroglycerin  tablets (0.8 mg) were given. The 3D data  set was reconstructed in 5% intervals of the 35-75% of the R-R cycle. Diastolic phases were analyzed on a dedicated workstation using MPR, MIP, and VRT modes. The patient received 100 cc of contrast. FINDINGS: Coronary Arteries:  Normal coronary origin.  Right dominance. Coronary Calcium  Score: Left main: 0 Left anterior descending artery: 675 Left circumflex artery and RI: 525 Right coronary artery: 235 Total: 1442 Percentile: 99th for age, sex, and race matched control. Plaque Analysis: Unavailable Left main: The left main is a large caliber vessel with a normal take off from the left coronary cusp that trifurcates to form a left anterior descending artery, ramus intermedius, and a left circumflex artery. No significant plaque. Left anterior descending artery: The LAD is a large caliber vessel with one large D1 that bifurcates. There is  a 70-99% mixed plaque stenosis (long length) in the mid LAD with spotty calcification. Mild mixed plaques proximal LAD. Distal LAD not well evaluated. Severe mixed plaque stenosis (70-99%) of the medial D1 bifurcation. Left circumflex artery: The LCX is non-dominant with one large obtuse marginal anatomy that bifurcates. Minimal ostial disease. Mild mixed LCX plaques. Severe (70-99%) calcified ostial OM1 disease prior to the bifurcation- blooming artifact. Moderate mixed plaque distal LCX. Right coronary artery: The RCA is dominant with normal take off from the right coronary cusp. the RCA terminates as a PDA and right posterolateral branch. Mild mixed plaques in the proximal RCA. Minimal mixed plaques in the mid RCA. Severe mixed plaque stenosis ostium of the PLA. Moderate (50-70%) mixed plaque in the body of the large acute marginal. There is a ramus intermedius vessel with severe (70-99%) ostial calcification. Blooming artifact. Other non-coronary findings: Right Atrium: Right atrial size is dilated. Right Ventricle: The right ventricular cavity is mildly dilated. Left Atrium:  Left atrial size is normal in size with no left atrial appendage filling defect. Left Ventricle: The ventricular cavity size is within normal limits. Interatrial septum: Difficult imaging due to artifact Main Pulmonary Artery: Mild dilation, 30 mm. Pulmonary veins: Normal pulmonary venous drainage. Pericardium: Normal thickness without significant effusion or calcium  present. Cardiac valves: The aortic valve is trileaflet without significant calcification. The mitral valve is normal without significant calcification. Aorta: Normal caliber without significant calcifications Aortic atherosclerosis. Extra-cardiac findings: See attached radiology report for non-cardiac structures. Artifact: Slab Image quality: Fair IMPRESSION: 1. Coronary calcium  score of 1442. This was 99th percentile for age, sex, and race matched control. 2. Normal coronary origin with right dominance. 3. CAD-RADS 4a Severe stenosis with high risk plaque modifier. (70-99%). Cardiac catheterization may be considered. CT-FFR not available. Consider symptom-guided anti-ischemic pharmacotherapy as well as risk factor modification per guideline directed care. RECOMMENDATIONS: RECOMMENDATIONS The proposed cut-off value of 1,651 AU yielded a 93 % sensitivity and 75 % specificity in grading AS severity in patients with classical low-flow, low-gradient AS. Proposed different cut-off values to define severe AS for men and women as 2,065 AU and 1,274 AU, respectively. The joint European and American recommendations for the assessment of AS consider the aortic valve calcium  score as a continuum - a very high calcium  score suggests severe AS and a low calcium  score suggests severe AS is unlikely. Florette Hurry, et al. 2017 ESC/EACTS Guidelines for the management of valvular heart disease. Eur Heart J 2017;38:2739-91. Coronary artery calcium  (CAC) score is a strong predictor of incident coronary heart disease (CHD) and provides predictive  information beyond traditional risk factors. CAC scoring is reasonable to use in the decision to withhold, postpone, or initiate statin therapy in intermediate-risk or selected borderline-risk asymptomatic adults (age 28-75 years and LDL-C >=70 to <190 mg/dL) who do not have diabetes or established atherosclerotic cardiovascular disease (ASCVD).* In intermediate-risk (10-year ASCVD risk >=7.5% to <20%) adults or selected borderline-risk (10-year ASCVD risk >=5% to <7.5%) adults in whom a CAC score is measured for the purpose of making a treatment decision the following recommendations have been made: If CAC = 0, it is reasonable to withhold statin therapy and reassess in 5 to 10 years, as long as higher risk conditions are absent (diabetes mellitus, family history of premature CHD in first degree relatives (males <55 years; females <65 years), cigarette smoking, LDL >=190 mg/dL or other independent risk factors). If CAC is 1 to 99, it is reasonable to initiate statin  therapy for patients >=28 years of age. If CAC is >=100 or >=75th percentile, it is reasonable to initiate statin therapy at any age. Cardiology referral should be considered for patients with CAC scores =400 or >=75th percentile. *2018 AHA/ACC/AACVPR/AAPA/ABC/ACPM/ADA/AGS/APhA/ASPC/NLA/PCNA Guideline on the Management of Blood Cholesterol: A Report of the American College of Cardiology/American Heart Association Task Force on Clinical Practice Guidelines. J Am Coll Cardiol. 2019;73(24):3168-3209. Gloriann Larger, MD Electronically Signed: By: Gloriann Larger M.D. On: 01/24/2024 13:27     I have independently reviewed the above radiologic studies and discussed with the patient   Recent Lab Findings: Lab Results  Component Value Date   WBC 6.8 01/26/2024   HGB 15.0 01/26/2024   HCT 44.8 01/26/2024   PLT 336 01/26/2024   GLUCOSE 115 (H) 01/26/2024   CHOL 147 01/24/2024   TRIG 66 01/24/2024   HDL 43 01/24/2024   LDLCALC 91  01/24/2024   ALT 11 01/23/2024   AST 16 01/23/2024   NA 137 01/26/2024   K 3.7 01/26/2024   CL 103 01/26/2024   CREATININE 0.96 01/26/2024   BUN 12 01/26/2024   CO2 24 01/26/2024   TSH 2.446 01/23/2024   INR 0.96 03/18/2017   HGBA1C 6.6 (H) 01/24/2024      Assessment / Plan:      CAD, minimally elevated Troponin on presentation, atypical angina symptoms also.. Coronary CTA concerning for CAD, cath revealed 3V CAD with chronically occluded LAD, normal LM... requesting coronary bypass grafting procedure DM Type 2-pretty well controlled A1c is 6.6 HTN- poor compliance, not taking medications since December GI- persistent N/V post meals... felt to be his angina symptoms has GI workup has been negative H/O Pontine Stroke with residual weakness.. on Plavix  prior to admission. Last dose was 6/12  Dispo- patient stable, denies chest pain, shortness of breath, no further N/V. Currently on Heparin  drip.  Received Plavix  6/12 and will require washout prior to proceeding with surgical revascularization.  Dr. Micael Adas was in the room and is very apprehensive of discharging patient home prior to surgery.   Dr. Luna Salinas will evaluate patient and determine candidacy and timing of surgery likely late next week at the earliest.  I  spent 40 minutes counseling the patient face to face.   Gates Kasal, PA-C 01/26/2024 9:07 AM  I have seen and examined Zachary [name].  I reviewed his records and his catheterization images.  Briefly he is a 53 year old man with a history of type 2 diabetes, hypertension, previous stroke, reflux, and adjustment mood disorder with anxiety.  He gives a history of regurgitating food after he eats.  He does not really have nausea or abdominal pain but says that about an hour after he eats he often will spit up his food.  He has had some chest discomfort with that.  He had an extensive GI workup which was unrevealing.  More recently he noted he is feeling more fatigued.  CTA showed  a calcium  score of 1442.  Cardiac catheterization revealed severe three-vessel coronary disease with a chronically totally occluded LAD.  Coronary bypass grafting is indicated for survival benefit.  It may or may not do anything to improve his postprandial regurgitation.  That has been speculated to be an anginal equivalent but it is very odd and would be highly unusual.  If this is truly an anginal equivalent that likely will get better after surgery but there is just no way to know that for sure.  His Allen's test is normal.  Await the vascular lab assessment but will likely use a left radial artery.  I discussed the general nature of the procedure, including the need for general anesthesia, the incisions to be used, the use of cardiopulmonary bypass, and the use of temporary pacemaker wires and drainage tubes postoperatively with Zachary [name] and his daughter.  We discussed the expected hospital stay, overall recovery and short and long term outcomes. I informed them of the indications, risks, benefits and alternatives.   He understands the risks include, but are not limited to death, stroke, MI, DVT/PE, bleeding, possible need for transfusion, infections, cardiac arrhythmias, as well as other organ system dysfunction including respiratory, renal, or GI complications.    Currently it appears the first potential opportunity for surgery would be next Friday, 02/02/2024.  Hopefully he can go home in the meantime and come back electively.  Milon Aloe Luna Salinas, MD [facility] [phone]

## 2024-01-26 NOTE — Progress Notes (Addendum)
 Progress Note  Patient Name: Zachary [name] Date of Encounter: 01/26/2024  [facility] Cardiologist: Zachary Keas, MD   Patient Profile     Subjective   Denies CP or SOB. Coronary CTA demonstrated Cor cal score of 1442, 70-99% mid LAD/D1/OM1/ostial Ramus, 50-70% ARVB.    Cath yesterday showed severe three-vessel CAD including CTO's of the mid LAD and distal RCA as well as severe stenosis of the D1 and OM.  LVEDP normal . Cardiac surgery consultation pending.  Denies any chest pain or shortness of breath presently  Multiple episodes of nonsustained VT up to 13 beats on telemetry  Inpatient Medications    Scheduled Meds:  amLODipine   10 mg Oral Daily   aspirin  EC  81 mg Oral Daily   atorvastatin   40 mg Oral Daily   carvedilol   6.25 mg Oral BID   feeding supplement  237 mL Oral BID BM   insulin  aspart  0-9 Units Subcutaneous TID WC   lisinopril   20 mg Oral Daily   pantoprazole   40 mg Oral Daily   polyethylene glycol  17 g Oral Daily   senna-docusate  1 tablet Oral BID   sodium chloride  flush  3 mL Intravenous Q12H   Continuous Infusions:  sodium chloride      heparin  1,000 Units/hr (01/26/24 0236)   PRN Meds: sodium chloride , acetaminophen , ondansetron  (ZOFRAN ) IV, sodium chloride  flush   Vital Signs    Vitals:   01/26/24 0241 01/26/24 0418 01/26/24 0712 01/26/24 0849  BP: 119/81  (!) 137/98 (!) 131/95  Pulse: 78  77 71  Resp: 18  17   Temp: 97.6 F (36.4 C)  98.2 F (36.8 C)   TempSrc: Oral  Oral   SpO2: 97%  98%   Weight:  79.9 kg    Height:        Intake/Output Summary (Last 24 hours) at 01/26/2024 1037 Last data filed at 01/26/2024 0834 Gross per 24 hour  Intake 388.26 ml  Output --  Net 388.26 ml      01/26/2024    4:18 AM 01/25/2024    6:25 AM 01/24/2024    6:17 AM  Last 3 Weights  Weight (lbs) 176 lb 2.4 oz 178 lb 12.8 oz 179 lb 1.6 oz  Weight (kg) 79.9 kg 81.103 kg 81.239 kg      Telemetry    NSR with frequent PVCs and nonsustained  VT up to 13 beats- Personally Reviewed  ECG    No new EKG to review- Personally Reviewed  Physical Exam   GEN: Well nourished, well developed in no acute distress HEENT: Normal NECK: No JVD; No carotid bruits LYMPHATICS: No lymphadenopathy CARDIAC:RRR, no murmurs, rubs, gallops RESPIRATORY:  Clear to auscultation without rales, wheezing or rhonchi  ABDOMEN: Soft, non-tender, non-distended MUSCULOSKELETAL:  No edema; No deformity  SKIN: Warm and dry NEUROLOGIC:  Alert and oriented x 3 PSYCHIATRIC:  Normal affect  Labs    High Sensitivity Troponin:   Recent Labs  Lab 01/23/24 0925 01/23/24 1454  TROPONINIHS 126* 125*      Chemistry Recent Labs  Lab 01/23/24 0847 01/25/24 0232 01/26/24 0252  NA 138 136 137  K 4.1 3.7 3.7  CL 101 102 103  CO2 27 24 24   GLUCOSE 126* 89 115*  BUN 7 13 12   CREATININE 1.10 1.00 0.96  CALCIUM  9.2 9.2 8.8*  PROT 7.2  --   --   ALBUMIN 3.5  --   --   AST 16  --   --  ALT 11  --   --   ALKPHOS 81  --   --   BILITOT 0.8  --   --   GFRNONAA >60 >60 >60  ANIONGAP 10 10 10      Hematology Recent Labs  Lab 01/23/24 0847 01/26/24 0252  WBC 6.4 6.8  RBC 5.56 5.43  HGB 15.1 15.0  HCT 46.9 44.8  MCV 84.4 82.5  MCH 27.2 27.6  MCHC 32.2 33.5  RDW 12.3 12.5  PLT 361 336    BNP Recent Labs  Lab 01/23/24 0925  BNP 77.0     DDimer  Recent Labs  Lab 01/23/24 1106  DDIMER 0.38     Radiology     Patient Profile     53 y.o. 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   Assessment & Plan    #Elevated troponin #Exertional fatigue #ASCAD #NSVT - He has been having the symptoms for over 6 months but just recently started having chest pain after having episodes of regurgitation and chest pain resolves with vomiting -He also has noted  exertional intolerance - EGD and gastric emptying study were normal by GI - EKG is concerning for prior anterior and inferior infarcts - High-sensitivity troponin 126 and 125 - Echo this admit: EF 55 to 60% with mild LVH, G1 DD, small inferior apical wall motion abnormality confirmed on strain imaging - Coronary CTA  demonstrated Cor cal score of 1442, 70-99% mid LAD/D1/OM1/ostial Ramus, 50-70% ARVB. -Cath yesterday showed severe three-vessel CAD including CTO's of the mid LAD and distal RCA as well as severe stenosis of the D1 and OM.  LVEDP normal .  - CVTS surgery has been consulted and await their recommendations - Patient has been on Plavix  but this is now been stopped and will need a washout prior to CABG if deemed a candidate - Continue aspirin  81 mg daily, atorvastatin  40 mg daily and IV heparin  drip - Increasing carvedilol  to 12.5 mg twice daily for blood pressure control and suppression of ventricular ectopy   #Hypertension -Poorly controlled due to medical noncompliance>> has not taken blood pressure medicine since December 24 -BP improved after starting blood pressure medication and 131/95 mmHg this am -Apparently they have some been some cost issues with being able to afford his medications -His medical regimen PTA was supposed to be atenolol  25 mg daily, amlodipine  10 mg daily, lisinopril  HCT 20-25 mg daily -He also has not been able to keep his medicines down recently because of constant episodes of nausea and vomiting -Continue amlodipine  10 mg daily, lisinopril  20 mg daily -Increase carvedilol  to 12.5 mg twice daily   #Hyperlipidemia - LDL 91, HDL 43 this admit  - Patient was not taking medications at home and he has now been restarted on atorvastatin  40 mg daily  - would aim for LDL goal<55 given extent of his CAD - Will atorvastatin  to 80 mg daily - He will need an FLP and ALT in 6 weeks   #History of CVA - Had been on on Plavix  since 2018 -Change current omeprazole   to Protonix  40 mg daily which does not have an interaction with Plavix  - Plavix  is now on hold for washout for CABG - Continue aspirin  81 mg daily   #Dysphagia #Nausea #Vomiting - suspect this was actually due to  extensive CAD - Has had an extensive workup including EGD and gastric emptying study - Continue Protonix  40 mg daily      For questions or updates, please contact [facility] Please consult [email or web address] for contact info under        Signed, Zachary Keas, MD  01/26/2024, 10:37 AM

## 2024-01-27 LAB — GLUCOSE, CAPILLARY
Glucose-Capillary: 111 mg/dL — ABNORMAL HIGH (ref 70–99)
Glucose-Capillary: 109 mg/dL — ABNORMAL HIGH (ref 70–99)
Glucose-Capillary: 87 mg/dL (ref 70–99)
Glucose-Capillary: 113 mg/dL — ABNORMAL HIGH (ref 70–99)

## 2024-01-27 LAB — BASIC METABOLIC PANEL WITH GFR
Anion gap: 7 (ref 5–15)
BUN: 14 mg/dL (ref 6–20)
CO2: 25 mmol/L (ref 22–32)
Calcium: 8.9 mg/dL (ref 8.9–10.3)
Chloride: 104 mmol/L (ref 98–111)
Creatinine, Ser: 1.04 mg/dL (ref 0.61–1.24)
GFR, Estimated: >60 mL/min (ref >60)
Glucose, Bld: 112 mg/dL — ABNORMAL HIGH (ref 70–99)
Potassium: 3.7 mmol/L (ref 3.5–5.1)
Sodium: 136 mmol/L (ref 135–145)

## 2024-01-27 LAB — CBC
HCT: 43.6 % (ref 39.0–52.0)
Hemoglobin: 14.5 g/dL (ref 13.0–17.0)
MCH: 27.5 pg (ref 26.0–34.0)
MCHC: 33.3 g/dL (ref 30.0–36.0)
MCV: 82.7 fL (ref 80.0–100.0)
Platelets: 324 10*3/uL (ref 150–400)
RBC: 5.27 MIL/uL (ref 4.22–5.81)
RDW: 12.4 % (ref 11.5–15.5)
WBC: 7.8 10*3/uL (ref 4.0–10.5)
nRBC: 0 % (ref 0.0–0.2)

## 2024-01-27 LAB — HEPARIN LEVEL (UNFRACTIONATED): Heparin Unfractionated: 0.49 [IU]/mL (ref 0.30–0.70)

## 2024-01-27 NOTE — Plan of Care (Signed)
   Problem: Education: Goal: Ability to describe self-care measures that may prevent or decrease complications (Diabetes Survival Skills Education) will improve Outcome: Progressing Goal: Individualized Educational Video(s) Outcome: Progressing

## 2024-01-27 NOTE — Plan of Care (Signed)
  Problem: Education: Goal: Ability to describe self-care measures that may prevent or decrease complications (Diabetes Survival Skills Education) will improve Outcome: Progressing   Problem: Coping: Goal: Ability to adjust to condition or change in health will improve Outcome: Progressing   Problem: Fluid Volume: Goal: Ability to maintain a balanced intake and output will improve Outcome: Progressing   Problem: Metabolic: Goal: Ability to maintain appropriate glucose levels will improve Outcome: Progressing   Problem: Skin Integrity: Goal: Risk for impaired skin integrity will decrease Outcome: Progressing   Problem: Tissue Perfusion: Goal: Adequacy of tissue perfusion will improve Outcome: Progressing   

## 2024-01-27 NOTE — Progress Notes (Signed)
 Progress Note  Patient Name: Zachary [name] Date of Encounter: 01/27/2024  [facility] Cardiologist: Gaylyn Keas, MD   Subjective   No events overnight- patient found to have multi-vessel disease; prior to taking over service patient was tentatively planned for discharge and return 02/02/24 for surgery.  Inpatient Medications    Scheduled Meds:  amLODipine   10 mg Oral Daily   aspirin  EC  81 mg Oral Daily   atorvastatin   80 mg Oral Daily   carvedilol   12.5 mg Oral BID   feeding supplement  237 mL Oral BID BM   insulin  aspart  0-9 Units Subcutaneous TID WC   lisinopril   20 mg Oral Daily   pantoprazole   40 mg Oral Daily   polyethylene glycol  17 g Oral Daily   senna-docusate  1 tablet Oral BID   sodium chloride  flush  3 mL Intravenous Q12H   Continuous Infusions:  heparin  1,000 Units/hr (01/27/24 0424)   PRN Meds: acetaminophen , ondansetron  (ZOFRAN ) IV, sodium chloride  flush   Vital Signs    Vitals:   01/26/24 2350 01/27/24 0323 01/27/24 0650 01/27/24 0736  BP: 123/86 (!) 139/96  (!) 132/92  Pulse: 66 72 82   Resp: 16 11 (!) 26   Temp: 97.7 F (36.5 C) 97.9 F (36.6 C)  97.8 F (36.6 C)  TempSrc: Oral Oral  Oral  SpO2: 96% 99% 99% 98%  Weight:   80.8 kg   Height:        Intake/Output Summary (Last 24 hours) at 01/27/2024 1035 Last data filed at 01/26/2024 1104 Gross per 24 hour  Intake 198.34 ml  Output --  Net 198.34 ml      01/27/2024    6:50 AM 01/26/2024    4:18 AM 01/25/2024    6:25 AM  Last 3 Weights  Weight (lbs) 178 lb 2.1 oz 176 lb 2.4 oz 178 lb 12.8 oz  Weight (kg) 80.8 kg 79.9 kg 81.103 kg      Telemetry    SR- no further NSVT- Personally Reviewed  Physical Exam   GEN: Well nourished, well developed in no acute distress HEENT: Normal NECK: No JVD CARDIAC:RRR, no murmurs, rubs, gallops RESPIRATORY:  Clear to auscultation without rales, wheezing or rhonchi  MUSCULOSKELETAL:  No edema; No deformity  SKIN: Warm and dry NEUROLOGIC:   Alert and oriented x 3 PSYCHIATRIC:  Normal affect   Labs    High Sensitivity Troponin:   Recent Labs  Lab 01/23/24 0925 01/23/24 1454  TROPONINIHS 126* 125*      Chemistry Recent Labs  Lab 01/23/24 0847 01/25/24 0232 01/26/24 0252 01/27/24 0219  NA 138 136 137 136  K 4.1 3.7 3.7 3.7  CL 101 102 103 104  CO2 27 24 24 25   GLUCOSE 126* 89 115* 112*  BUN 7 13 12 14   CREATININE 1.10 1.00 0.96 1.04  CALCIUM  9.2 9.2 8.8* 8.9  PROT 7.2  --   --   --   ALBUMIN 3.5  --   --   --   AST 16  --   --   --   ALT 11  --   --   --   ALKPHOS 81  --   --   --   BILITOT 0.8  --   --   --   GFRNONAA >60 >60 >60 >60  ANIONGAP 10 10 10 7      Hematology Recent Labs  Lab 01/23/24 0847 01/26/24 0252 01/27/24 0219  WBC  6.4 6.8 7.8  RBC 5.56 5.43 5.27  HGB 15.1 15.0 14.5  HCT 46.9 44.8 43.6  MCV 84.4 82.5 82.7  MCH 27.2 27.6 27.5  MCHC 32.2 33.5 33.3  RDW 12.3 12.5 12.4  PLT 361 336 324    BNP Recent Labs  Lab 01/23/24 0925  BNP 77.0     DDimer  Recent Labs  Lab 01/23/24 1106  DDIMER 0.38      Patient Profile     53 y.o. male  with a hx of CVA ( left pontine and right paramedian pontine 2018), type 2 diabetes, hypertension and adjustment disorder who is being seen 01/23/2024 for the evaluation of elevated troponin at the request of [name], MD.   Assessment & Plan    NSTEMI Multi-vessel CAD NSVT - Coronary CTA  demonstrated Cor cal score of 1442, 70-99% mid LAD/D1/OM1/ostial Ramus -Cath  showed severe three-vessel CAD including CTO's of the mid LAD and distal RCA as well as severe stenosis of the D1 and OM.  LVEDP normal .  - CVTS surgery has been consulted tentatively planned for discharge with surgery 02/02/24 - Patient has been on Plavix  but this is now been stopped and is in washout prior to CABG  - Continue aspirin  81 mg daily, atorvastatin  40 mg daily and IV heparin  drip - On carvedilol  to 12.5 mg twice daily for blood pressure control and  suppression of ventricular ectopy -LVEF preserved; we will use symptoms burden and NSVT burden as a our decision to DC   #Hypertension -Continue amlodipine  10 mg daily, lisinopril  20 mg daily -carvedilol  12.5 mg twice daily   #Hyperlipidemia - LDL 91, HDL 43 this admit  - Patient was not taking medications at home and he has now been - would aim for LDL goal<55 given extent of his CAD -  atorvastatin  to 80 mg daily   #History of CVA - Had been on on Plavix  since 2018 - Plavix  is now on hold for washout for CABG - Continue aspirin  81 mg daily   #Dysphagia #Nausea #Vomiting - suspect this was actually due to extensive CAD - Has had an extensive workup including EGD and gastric emptying study - Continue Protonix  40 mg daily  Full Code Patient is presently on heparin   DC Planning: Will discuss with TCTS- if no ectopy, sx burden is stable and deemed safe in conjunction with TCTS will discharge for surgery Friday - his NSVT has resolved on increased BB - Possible DC tomorrow; he will ambulate today and if no symptoms and ok with TCTS will be discharged  [facility] is primary      For questions or updates, please contact [facility] Please consult [email or web address] for contact info under    Gloriann Larger, MD FASE [facility] Cardiologist [facility]  [address] [place], [place] [address] [phone]  10:41 AM

## 2024-01-27 NOTE — Progress Notes (Signed)
 PHARMACY - ANTICOAGULATION CONSULT NOTE  Pharmacy Consult for Heparin  Indication: chest pain/ACS  No Known Allergies  Patient Measurements: Height: 5' 10 (177.8 cm) Weight: 80.8 kg (178 lb 2.1 oz) IBW/kg (Calculated) : 73 HEPARIN  DW (KG): 81.2  Vital Signs: Temp: 97.8 F (36.6 C) (06/14 0736) Temp Source: Oral (06/14 0736) BP: 132/92 (06/14 0736) Pulse Rate: 82 (06/14 0650)  Labs: Recent Labs    01/25/24 0232 01/26/24 0252 01/26/24 1112 01/26/24 2025 01/27/24 0219  HGB  --  15.0  --   --  14.5  HCT  --  44.8  --   --  43.6  PLT  --  336  --   --  324  HEPARINUNFRC  --   --  0.38 0.53 0.49  CREATININE 1.00 0.96  --   --  1.04    Estimated Creatinine Clearance: 82.9 mL/min (by C-G formula based on SCr of 1.04 mg/dL).   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 90 tablet 1 Past Month   atenolol  (TENORMIN ) 25 MG tablet Take 1 tablet (25 mg total) by mouth daily. 90 tablet 0 Past Month   clopidogrel  (PLAVIX ) 75 MG tablet Take 1 tablet (75 mg total) by mouth daily. 90 tablet 1 Past Month   lisinopril -hydrochlorothiazide  (ZESTORETIC ) 20-25 MG tablet Take 1 tablet by mouth daily. 90 tablet 1 Past Month   metFORMIN  (GLUCOPHAGE ) 500 MG tablet Take 2 tablets (1,000 mg total) by mouth 2 (two) times daily with a meal. 360 tablet 0 Past Month   omeprazole  (PRILOSEC) 20 MG capsule Take 1 capsule (20 mg total) by mouth daily. 30 capsule 3 Past Month   BD ULTRA-FINE LANCETS lancets Use as instructed 100 each 12    blood glucose meter kit and supplies KIT Dispense based on patient and insurance preference. Use up to four times daily as directed. (FOR ICD-9 250.00, 250.01). 1 each 0     Blood Glucose Monitoring Suppl (TRUE METRIX METER) w/Device KIT 1 each by Does not apply route 3 (three) times daily. 1 kit 0    glucose blood (TRUE METRIX BLOOD GLUCOSE TEST) test strip Use as instructed 100 each 12    TRUEplus Lancets 28G MISC Use as instructed. Check blood glucose level by fingerstick twice per day. 100 each 3    Scheduled:   amLODipine   10 mg Oral Daily   aspirin  EC  81 mg Oral Daily   atorvastatin   80 mg Oral Daily   carvedilol   12.5 mg Oral BID   feeding supplement  237 mL Oral BID BM   insulin  aspart  0-9 Units Subcutaneous TID WC   lisinopril   20 mg Oral Daily   pantoprazole   40 mg Oral Daily   polyethylene glycol  17 g Oral Daily   senna-docusate  1 tablet Oral BID   sodium chloride  flush  3 mL Intravenous Q12H   Infusions:   heparin  1,000 Units/hr (01/27/24 0424)    Assessment: 53 yo male s/p cath that revealed severe three-vessel disease. Heparin  started 6/13 0236 hour. Of note, two instances of short heparin  pauses during infusion. Heparin  level returned 6/13 1118 hour as 0.38 units/ml (in target range 0.3-0.7 units/ml).  CBCs are within normal limits.   6/14 AM - heparin  level therapeutic at 0.49 with heparin  running at 1,000 units/hour. CBC WNL. No signs of bleeding or issues with the heparin  infusion noted.    Goal of Therapy:  Heparin  level 0.3-0.7 units/ml Monitor platelets by anticoagulation protocol: Yes   Plan:  Continue heparin  1000 units/hr  Daily heparin  level, CBC Continue to monitor H&H and platelets  Chrystie Crass, PharmD Clinical Pharmacist  01/27/2024 7:56 AM

## 2024-01-28 LAB — CBC
HCT: 44.6 % (ref 39.0–52.0)
Hemoglobin: 14.7 g/dL (ref 13.0–17.0)
MCH: 27.2 pg (ref 26.0–34.0)
MCHC: 33 g/dL (ref 30.0–36.0)
MCV: 82.6 fL (ref 80.0–100.0)
Platelets: 323 10*3/uL (ref 150–400)
RBC: 5.4 MIL/uL (ref 4.22–5.81)
RDW: 12.4 % (ref 11.5–15.5)
WBC: 6.9 10*3/uL (ref 4.0–10.5)
nRBC: 0 % (ref 0.0–0.2)

## 2024-01-28 LAB — GLUCOSE, CAPILLARY
Glucose-Capillary: 98 mg/dL (ref 70–99)
Glucose-Capillary: 116 mg/dL — ABNORMAL HIGH (ref 70–99)

## 2024-01-28 LAB — BASIC METABOLIC PANEL WITH GFR
Anion gap: 9 (ref 5–15)
BUN: 15 mg/dL (ref 6–20)
CO2: 23 mmol/L (ref 22–32)
Calcium: 9.1 mg/dL (ref 8.9–10.3)
Chloride: 104 mmol/L (ref 98–111)
Creatinine, Ser: 1.17 mg/dL (ref 0.61–1.24)
GFR, Estimated: >60 mL/min (ref >60)
Glucose, Bld: 106 mg/dL — ABNORMAL HIGH (ref 70–99)
Potassium: 3.8 mmol/L (ref 3.5–5.1)
Sodium: 136 mmol/L (ref 135–145)

## 2024-01-28 LAB — HEPARIN LEVEL (UNFRACTIONATED): Heparin Unfractionated: 0.43 [IU]/mL (ref 0.30–0.70)

## 2024-01-28 MED ORDER — CARVEDILOL 12.5 MG PO TABS: 12.5000 mg | ORAL_TABLET | Freq: Two times a day (BID) | ORAL | 3 refills | Status: AC

## 2024-01-28 MED ORDER — METFORMIN HCL 500 MG PO TABS: 1000.0000 mg | ORAL_TABLET | Freq: Two times a day (BID) | ORAL | Status: DC

## 2024-01-28 MED ORDER — ASPIRIN 81 MG PO TBEC
81.0000 mg | DELAYED_RELEASE_TABLET | Freq: Every day | ORAL | Status: AC
Start: 2024-01-29 — End: ?

## 2024-01-28 MED ORDER — LISINOPRIL 20 MG PO TABS: 20.0000 mg | ORAL_TABLET | Freq: Every day | ORAL | 3 refills | Status: DC

## 2024-01-28 MED ORDER — ATORVASTATIN CALCIUM 80 MG PO TABS: 80.0000 mg | ORAL_TABLET | Freq: Every day | ORAL | 3 refills | Status: AC

## 2024-01-28 NOTE — Progress Notes (Signed)
 PHARMACY - ANTICOAGULATION CONSULT NOTE  Pharmacy Consult for Heparin  Indication: chest pain/ACS  No Known Allergies  Patient Measurements: Height: 5' 10 (177.8 cm) Weight: 80.8 kg (178 lb 2.1 oz) IBW/kg (Calculated) : 73 HEPARIN  DW (KG): 81.2  Vital Signs: Temp: 98.6 F (37 C) (06/15 0415) Temp Source: Oral (06/15 0415) BP: 124/93 (06/15 0415) Pulse Rate: 70 (06/15 0110)  Labs: Recent Labs    01/26/24 0252 01/26/24 1112 01/26/24 2025 01/27/24 0219 01/28/24 0210  HGB 15.0  --   --  14.5 14.7  HCT 44.8  --   --  43.6 44.6  PLT 336  --   --  324 323  HEPARINUNFRC  --    < > 0.53 0.49 0.43  CREATININE 0.96  --   --  1.04 1.17   < > = values in this interval not displayed.    Estimated Creatinine Clearance: 73.7 mL/min (by C-G formula based on SCr of 1.17 mg/dL).   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 90 tablet 1 Past Month   atenolol  (TENORMIN ) 25 MG tablet Take 1 tablet (25 mg total) by mouth daily. 90 tablet 0 Past Month   clopidogrel  (PLAVIX ) 75 MG tablet Take 1 tablet (75 mg total) by mouth daily. 90 tablet 1 Past Month   lisinopril -hydrochlorothiazide  (ZESTORETIC ) 20-25 MG tablet Take 1 tablet by mouth daily. 90 tablet 1 Past Month   metFORMIN  (GLUCOPHAGE ) 500 MG tablet Take 2 tablets (1,000 mg total) by mouth 2 (two) times daily with a meal. 360 tablet 0 Past Month   omeprazole  (PRILOSEC) 20 MG capsule Take 1 capsule (20 mg total) by mouth daily. 30 capsule 3 Past Month   BD ULTRA-FINE LANCETS lancets Use as instructed 100 each 12    blood glucose meter kit and supplies KIT Dispense based on patient and insurance preference. Use up to four times daily as  directed. (FOR ICD-9 250.00, 250.01). 1 each 0    Blood Glucose Monitoring Suppl (TRUE METRIX METER) w/Device KIT 1 each by Does not apply route 3 (three) times daily. 1 kit 0    glucose blood (TRUE METRIX BLOOD GLUCOSE TEST) test strip Use as instructed 100 each 12    TRUEplus Lancets 28G MISC Use as instructed. Check blood glucose level by fingerstick twice per day. 100 each 3    Scheduled:   amLODipine   10 mg Oral Daily   aspirin  EC  81 mg Oral Daily   atorvastatin   80 mg Oral Daily   carvedilol   12.5 mg Oral BID   feeding supplement  237 mL Oral BID BM   insulin  aspart  0-9 Units Subcutaneous TID WC   lisinopril   20 mg Oral Daily   pantoprazole   40 mg Oral Daily   polyethylene glycol  17 g Oral Daily   senna-docusate  1 tablet Oral BID   sodium chloride  flush  3 mL Intravenous Q12H   Infusions:   heparin  1,000 Units/hr (01/27/24 0424)    Assessment: 53 yo male s/p cath that revealed severe three-vessel disease. Heparin  started 6/13 0236 hour. Of note, two instances of short heparin  pauses during infusion. Heparin  level returned 6/13 1118 hour as 0.38  units/ml (in target range 0.3-0.7 units/ml). CBCs are within normal limits.   6/15 AM - heparin  level 0.43, therapeutic on 1000 units/hr.  CBC stable.  CABG pending plavix  washout (LD 6/12) - tentatively Fri 6/20.  Goal of Therapy:  Heparin  level 0.3-0.7 units/ml Monitor platelets by anticoagulation protocol: Yes   Plan:  Continue heparin  1000 units/hr  Daily heparin  level, CBC Continue to monitor H&H and platelets  Cecillia Cogan, PharmD Clinical Pharmacist 01/28/2024  7:00 AM

## 2024-01-28 NOTE — Discharge Summary (Addendum)
 Discharge Summary   Patient ID: Zachary [name] MRN: [id number]; DOB: [birth year]  Admit date: 01/23/2024 Discharge date: 01/28/2024  PCP:  Collins Dean, NP   [facility] Providers Cardiologist:  Gaylyn Keas, MD       Discharge Diagnoses  Principal Problem:   Unstable angina [facility]) Active Problems:   Troponin level elevated   Hypertensive urgency   Coronary artery disease of native artery of native heart with stable angina pectoris (HCC)   Pure hypercholesterolemia   Gastroesophageal reflux disease without esophagitis   History of CVA (cerebrovascular accident)   Regurgitation of food   Diagnostic Studies/Procedures  LEFT HEART CATH AND CORONARY ANGIOGRAPHY 01/25/2024 Conclusions: Severe three-vessel CAD, as detailed below, including CTO's of mid LAD and distal RCA, as well as severe D1 and OM2 stenoses. Normal left ventricular filling pressure (LVEDP 10 mmHg).  Recommendations: Discontinue clopidogrel  and start aspirin  and IV heparin . Cardiac surgery consultation for CABG. Aggressive secondary prevention of ASCVD.  Sammy Crisp, MD [facility]  ECHO COMPLETE WO IMAGING ENHANCING AGENT 01/23/2024 IMPRESSIONS 1. Left ventricular ejection fraction, by estimation, is 55 to 60%. The left ventricle has normal function. The left ventricle demonstrates regional wall motion abnormalities (see scoring diagram/findings for description). There is mild left ventricular hypertrophy. Left ventricular diastolic parameters are consistent with Grade I diastolic dysfunction (impaired relaxation). Small apical, inferior wall motion abnormality confirmed on strain imaging . The average left ventricular global longitudinal strain is -14.1 %. The global longitudinal strain is abnormal. 2. Right ventricular systolic function is normal. The right ventricular size is normal. There is normal pulmonary artery systolic pressure. 3. The mitral valve is normal in structure. Trivial  mitral valve regurgitation. 4. The aortic valve is tricuspid. There is mild calcification of the aortic valve. Aortic valve regurgitation is not visualized. Aortic valve sclerosis is present, with no evidence of aortic valve stenosis. 5. The inferior vena cava is normal in size with greater than 50% respiratory variability, suggesting right atrial pressure of 3 mmHg.  CT CORONARY MORPH W/CTA COR W/SCORE W/CA W/CM &/OR WO/CM 01/24/2024 IMPRESSION: No pneumonia, pulmonary edema, or pleural effusion.  IMPRESSION: 1. Coronary calcium  score of 1442. This was 99th percentile for age, sex, and race matched control.  2. Normal coronary origin with right dominance.  3. CAD-RADS 4a Severe stenosis with high risk plaque modifier. (70-99%). Cardiac catheterization may be considered. CT-FFR not available. Consider symptom-guided anti-ischemic pharmacotherapy as well as risk factor modification per guideline directed care.    _____________   History of Present Illness   Zachary [name] is a 53 y.o. male with a hx of hypertension, diabetes mellitus, prior CVA w residual L sided deficits on chronic ASA and Clopidogrel  who presented to the hospital with worsening symptoms of post prandial vomiting. He also noted decreased exercise tolerance for about a year. He had previously undergone GI workup which was unremarkable. He stopped his BP meds because of his GI symptoms. His BP was markedly elevated on admission. His hs-Troponin was mildly elevated and flat. EKG showed old inferior infarct, probable anterior infarct. Cardiology was asked to see him for evaluation.  Hospital Course   Consultants: CT Surgery, Cardiology    He was admitted by the Teaching Service and seen in consultation by Cardiology. An echocardiogram was obtained and demonstrated normal EF with small focal wall motion abnormality. CCTA was arranged and demonstrated CAC score of1442 and suggestion of 3 vessel CAD. Cardiac catheterization was  arranged and demonstrated severe 3 vessel CAD  with chronic occlusion of the LAD, RCA and severe stenosis in the D1, OM2. CT Surgery was asked to see him. Dr. Luna Salinas saw him and recommended proceeding with CABG. Clopidogrel  was stopped to allow for washout. Surgery is scheduled for Friday, February 02, 2024. He was continued on ASA. He was noted to have NSVT on tele. His beta-blocker (Carvedilol ) was increased. Atorvastatin  dose was increased to 80 mg. NSVT resolved on higher dose beta-blocker. He remained stable without chest pain. He was seen by Dr. Paulita Boss today and, after review with Dr. Luna Salinas and Dr. Sherene Dilling of CT Surgery, it is felt that he is stable for discharge to home with plans to return to the hospital on Friday for his CABG.     Did the patient have an acute coronary syndrome (MI, NSTEMI, STEMI, etc) this admission?:  No                               Did the patient have a percutaneous coronary intervention (stent / angioplasty)?:  No.       _____________  Discharge Vitals Blood pressure (!) 126/92, pulse 73, temperature 97.9 F (36.6 C), temperature source Oral, resp. rate 19, height 5' 10 (1.778 m), weight 80.8 kg, SpO2 100%.  Filed Weights   01/26/24 0418 01/27/24 0650 01/28/24 0417  Weight: 79.9 kg 80.8 kg 80.8 kg    Labs & Radiologic Studies  CBC Recent Labs    01/27/24 0219 01/28/24 0210  WBC 7.8 6.9  HGB 14.5 14.7  HCT 43.6 44.6  MCV 82.7 82.6  PLT 324 323   Basic Metabolic Panel Recent Labs    52/84/13 0219 01/28/24 0210  NA 136 136  K 3.7 3.8  CL 104 104  CO2 25 23  GLUCOSE 112* 106*  BUN 14 15  CREATININE 1.04 1.17  CALCIUM  8.9 9.1   Liver Function Tests Recent Labs    01/23/24 0847  AST 16  ALT 11    High Sensitivity Troponin:   Recent Labs  Lab 01/23/24 0925 01/23/24 1454  TROPONINIHS 126* 125*     D-Dimer Recent Labs    01/23/24 1106  DDIMER 0.38     Hemoglobin A1C Recent Labs    01/24/24 0348  HGBA1C 6.6*      Fasting Lipid Panel Recent Labs    01/24/24 0348  CHOL 147  HDL 43  LDLCALC 91  TRIG 66  CHOLHDL 3.4     Thyroid Function Tests Recent Labs    01/23/24 0925  TSH 2.446     _____________   DG ESOPHAGUS W DOUBLE CM (HD) Result Date: 01/24/2024 CLINICAL DATA:  53 year old with complaint of regurgitation after meals. Barium swallow requested. EXAM: ESOPHAGUS/BARIUM SWALLOW/TABLET STUDY TECHNIQUE: Combined double and single contrast examination was performed using effervescent crystals, high-density barium, and thin liquid barium. This exam was performed by Quintin Buckle PA-C, and was supervised and interpreted by Creasie Doctor, MD. FLUOROSCOPY: Radiation Exposure Index (as provided by the fluoroscopic device): 17.1 mGy Kerma COMPARISON:  None Available. FINDINGS: Swallowing: Appears normal. No vestibular penetration or aspiration seen. Pharynx: Small volume residue post-swallow. Esophagus: Normal appearance. Esophageal motility: Within normal limits. Hiatal Hernia: None. Gastroesophageal reflux: None visualized. Ingested 13mm barium tablet: Passed normally Other: None.  IMPRESSION:  Small volume residue in the vallecula. Otherwise unremarkable UGI series.  Electronically Signed   By: Creasie Doctor M.D.   On: 01/24/2024 16:00   DG Chest  Port 1 View Result Date: 01/23/2024 CLINICAL DATA:  CT. EXAM: PORTABLE CHEST 1 VIEW COMPARISON:  None Available. FINDINGS: No focal consolidation, pleural effusion, or pneumothorax. The cardiac silhouette is within normal limits. No acute osseous pathology.  IMPRESSION:  No active disease.  Electronically Signed   By: Angus Bark M.D.   On: 01/23/2024 09:25    Disposition Pt is being discharged home today in good condition.  Follow-up Plans & Appointments  Follow-up Information     [facility] Follow up on 02/02/2024.   Why: Return on Friday June 20 as instructed for heart surgery. Contact information: [address] [place]  [address] [phone]               Discharge Instructions     Diet - low sodium heart healthy   Complete by: As directed    Driving Restrictions   Complete by: As directed    No driving   Increase activity slowly   Complete by: As directed    Lifting restrictions   Complete by: As directed    No lifting   Sexual Activity Restrictions   Complete by: As directed    None       Discharge Medications Allergies as of 01/28/2024   No Known Allergies      Medication List     PAUSE taking these medications    clopidogrel  75 MG tablet Wait to take this until your doctor or other care provider tells you to start again. Commonly known as: PLAVIX  Take 1 tablet (75 mg total) by mouth daily.       STOP taking these medications    atenolol  25 MG tablet Commonly known as: TENORMIN    lisinopril -hydrochlorothiazide  20-25 MG tablet Commonly known as: ZESTORETIC        TAKE these medications    amLODipine  10 MG tablet Commonly known as: NORVASC  Take 1 tablet (10 mg total) by mouth daily.   aspirin  EC 81 MG tablet Take 1 tablet (81 mg total) by mouth daily. Swallow whole. Start taking on: January 29, 2024   atorvastatin  80 MG tablet Commonly known as: LIPITOR Take 1 tablet (80 mg total) by mouth daily. Start taking on: January 29, 2024   BD Ultra-Fine Lancets lancets Use as instructed   TRUEplus Lancets 28G Misc Use as instructed. Check blood glucose level by fingerstick twice per day.   blood glucose meter kit and supplies Kit Dispense based on patient and insurance preference. Use up to four times daily as directed. (FOR ICD-9 250.00, 250.01).   carvedilol  12.5 MG tablet Commonly known as: COREG  Take 1 tablet (12.5 mg total) by mouth 2 (two) times daily.   lisinopril  20 MG tablet Commonly known as: ZESTRIL  Take 1 tablet (20 mg total) by mouth daily. Start taking on: January 29, 2024   metFORMIN  500 MG tablet Commonly  known as: GLUCOPHAGE  Take 2 tablets (1,000 mg total) by mouth 2 (two) times daily with a meal for 5 days. Do not take Friday morning February 02, 2024 What changed: additional instructions   omeprazole  20 MG capsule Commonly known as: PRILOSEC Take 1 capsule (20 mg total) by mouth daily.   True Metrix Blood Glucose Test test strip Generic drug: glucose blood Use as instructed   True Metrix Meter w/Device Kit 1 each by Does not apply route 3 (three) times daily.        Outstanding Labs/Studies   Duration of Discharge Encounter: APP Time: 32 minutes  Signed, Marlyse Single, PA-C 01/28/2024, 1:07 PM   I have personally seen and examined the patient.  My HPI, Exam, and assessment and plan are below, independent of the NPP above.  53 year old with diabetes and multivessel coronary artery disease who presents for pre-surgical evaluation. He was evaluated by Doctor Luna Salinas for his cardiac condition.   He has a history of multivessel coronary artery disease, confirmed by cardiac CT and heart catheterization showing significant blockages and chronic total occlusion (CTO) coronary disease. He is currently on a regimen of amlodipine , lisinopril , and carvedilol . He has been chest pain free and has not experienced any non-sustained ventricular tachycardia recently. No symptoms of chest pain or non-sustained ventricular tachycardia. He has been ambulating without symptoms.   His diabetes management includes metformin . He also has chronic gastrointestinal issues related to gastric emptying and diabetes. GERD symptoms are being managed with pantoprazole , which was switched from omeprazole .   He has a history of hypertension and hyperlipidemia, managed with amlodipine , lisinopril , and a higher dose of atorvastatin  (80 mg). He is not currently taking hydrochlorothiazide . He experienced a prior stroke, and his current medication regimen includes aspirin .   Inpatient Medications    Scheduled  Meds:  amLODipine   10 mg Oral Daily   aspirin  EC  81 mg Oral Daily   atorvastatin   80 mg Oral Daily   carvedilol   12.5 mg Oral BID   feeding supplement  237 mL Oral BID BM   insulin  aspart  0-9 Units Subcutaneous TID WC   lisinopril   20 mg Oral Daily   pantoprazole   40 mg Oral Daily   polyethylene glycol  17 g Oral Daily   senna-docusate  1 tablet Oral BID   sodium chloride  flush  3 mL Intravenous Q12H        Continuous Infusions:  heparin  1,000 Units/hr (01/28/24 0720)        PRN Meds: acetaminophen , ondansetron  (ZOFRAN ) IV, sodium chloride  flush        Vital Signs          Vitals:    01/28/24 0110 01/28/24 0415 01/28/24 0417 01/28/24 0700  BP: 121/87 (!) 124/93   (!) 134/95  Pulse: 70     70  Resp: 19        Temp: 97.6 F (36.4 C) 98.6 F (37 C)   98.3 F (36.8 C)  TempSrc: Oral Oral   Oral  SpO2: 99%     100%  Weight:     80.8 kg    Height:            No intake or output data in the 24 hours ending 01/28/24 1019       01/28/2024    4:17 AM 01/27/2024    6:50 AM 01/26/2024    4:18 AM  Last 3 Weights  Weight (lbs) 178 lb 2.1 oz 178 lb 2.1 oz 176 lb 2.4 oz  Weight (kg) 80.8 kg 80.8 kg 79.9 kg       Telemetry    SR- no further NSVT- Personally Reviewed; NSVT stopped 01/26/24 with increase in BB   Physical Exam at Discharge    GEN: Well nourished, well developed in no acute distress HEENT: Normal NECK: No JVD CARDIAC:RRR, no murmurs, rubs, gallops RESPIRATORY:  Clear to auscultation without rales, wheezing or rhonchi  MUSCULOSKELETAL:  No edema; No deformity  SKIN: Warm and dry NEUROLOGIC:  Alert and oriented x 3 PSYCHIATRIC:  Normal affect    Labs    High  Sensitivity Troponin:   Last Labs      Recent Labs  Lab 01/23/24 0925 01/23/24 1454  TROPONINIHS 126* 125*         Chemistry Last Labs         Recent Labs  Lab 01/23/24 0847 01/25/24 0232 01/26/24 0252 01/27/24 0219 01/28/24 0210  NA 138   < > 137 136 136  K 4.1   < > 3.7 3.7  3.8  CL 101   < > 103 104 104  CO2 27   < > 24 25 23   GLUCOSE 126*   < > 115* 112* 106*  BUN 7   < > 12 14 15   CREATININE 1.10   < > 0.96 1.04 1.17  CALCIUM  9.2   < > 8.8* 8.9 9.1  PROT 7.2  --   --   --   --   ALBUMIN 3.5  --   --   --   --   AST 16  --   --   --   --   ALT 11  --   --   --   --   ALKPHOS 81  --   --   --   --   BILITOT 0.8  --   --   --   --   GFRNONAA >60   < > >60 >60 >60  ANIONGAP 10   < > 10 7 9    < > = values in this interval not displayed.        Hematology Last Labs       Recent Labs  Lab 01/26/24 0252 01/27/24 0219 01/28/24 0210  WBC 6.8 7.8 6.9  RBC 5.43 5.27 5.40  HGB 15.0 14.5 14.7  HCT 44.8 43.6 44.6  MCV 82.5 82.7 82.6  MCH 27.6 27.5 27.2  MCHC 33.5 33.3 33.0  RDW 12.5 12.4 12.4  PLT 336 324 323        BNP Last Labs     Recent Labs  Lab 01/23/24 0925  BNP 77.0        DDimer  Last Labs     Recent Labs  Lab 01/23/24 1106  DDIMER 0.38            Patient Profile     53 y.o. male  with a hx of CVA ( left pontine and right paramedian pontine 2018), type 2 diabetes, hypertension and adjustment disorder who is being seen 01/23/2024 for the evaluation of elevated troponin at the request of [name], MD. Evaluated during his hospitalization with CT for CAD and found 3VD, s/p Cath with plans on for bypass this Friday   Assessment & Plan    Coronary artery disease Multivessel coronary artery disease with significant blockages confirmed on cardiac CT and heart catheterization. Currently asymptomatic for non-sustained ventricular tachycardia. Planned for Plavix  washout and coronary artery bypass surgery on Friday. Cardiothoracic surgeons concur with discharge and outpatient follow-up. Shared decision-making with him and cardiothoracic surgeons regarding discharge plan. - Discharge home with outpatient follow-up. - Continue aspirin  therapy. - Hold clopidogrel  due to upcoming surgery. - Discontinued heparin  today. -  Ambulate once more to ensure absence of cardiac symptoms. - Educated on red flag symptoms and recommend emergent ED evaluation if symptoms occur.   Hypertension Hypertension well-controlled on amlodipine , lisinopril , and carvedilol . Home hydrochlorothiazide  is being held. - Continue amlodipine . - Continue lisinopril . - Continue carvedilol  instead of atenolol . - Do not discharge on home hydrochlorothiazide .   Diabetes mellitus Diabetes  management includes metformin , which is reasonable to restart. Metformin  should be stopped on the day of surgery. - Restart metformin . - Stop metformin  on the day of surgery.   Hyperlipidemia Hyperlipidemia management includes atorvastatin . Plan to increase atorvastatin  to 80 mg. - Increase atorvastatin  to 80 mg (started inpatient continue as outpatient)   Stroke Stroke considered in the management plan. Aspirin  therapy is continued for stroke prevention. - Continue aspirin  therapy and staitn therapy   DC Planning:  - Discussed with Dr. Luna Salinas; reasonable for DC - Discussed with Dr. Sherene Dilling, reasonable to DC no further testing required - reviewed care plan with patient and with Katie in 2C - No further CP, stable hemodynamics no further testing needed prior to [place] CABG - planned for discharge this PM with pick up by his daughter  MD time: 34 minutes   Gloriann Larger, MD FASE [facility] Cardiologist [facility]  [address] [place], [address] [place], [place] [address] [phone]  1:26 PM

## 2024-01-28 NOTE — Progress Notes (Signed)
 Progress Note  Patient Name: Zachary [name] Date of Encounter: 01/28/2024  [facility] Cardiologist: Gaylyn Keas, MD   Subjective   53 year old with diabetes and multivessel coronary artery disease who presents for pre-surgical evaluation. He was evaluated by Doctor Luna Salinas for his cardiac condition.  He has a history of multivessel coronary artery disease, confirmed by cardiac CT and heart catheterization showing significant blockages and chronic total occlusion (CTO) coronary disease. He is currently on a regimen of amlodipine , lisinopril , and carvedilol . He has been chest pain free and has not experienced any non-sustained ventricular tachycardia recently. No symptoms of chest pain or non-sustained ventricular tachycardia. He has been ambulating without symptoms.  His diabetes management includes metformin . He also has chronic gastrointestinal issues related to gastric emptying and diabetes. GERD symptoms are being managed with pantoprazole , which was switched from omeprazole .  He has a history of hypertension and hyperlipidemia, managed with amlodipine , lisinopril , and a higher dose of atorvastatin  (80 mg). He is not currently taking hydrochlorothiazide . He experienced a prior stroke, and his current medication regimen includes aspirin .  Inpatient Medications    Scheduled Meds:  amLODipine   10 mg Oral Daily   aspirin  EC  81 mg Oral Daily   atorvastatin   80 mg Oral Daily   carvedilol   12.5 mg Oral BID   feeding supplement  237 mL Oral BID BM   insulin  aspart  0-9 Units Subcutaneous TID WC   lisinopril   20 mg Oral Daily   pantoprazole   40 mg Oral Daily   polyethylene glycol  17 g Oral Daily   senna-docusate  1 tablet Oral BID   sodium chloride  flush  3 mL Intravenous Q12H   Continuous Infusions:  heparin  1,000 Units/hr (01/28/24 0720)   PRN Meds: acetaminophen , ondansetron  (ZOFRAN ) IV, sodium chloride  flush   Vital Signs    Vitals:   01/28/24 0110 01/28/24 0415  01/28/24 0417 01/28/24 0700  BP: 121/87 (!) 124/93  (!) 134/95  Pulse: 70   70  Resp: 19     Temp: 97.6 F (36.4 C) 98.6 F (37 C)  98.3 F (36.8 C)  TempSrc: Oral Oral  Oral  SpO2: 99%   100%  Weight:   80.8 kg   Height:       No intake or output data in the 24 hours ending 01/28/24 1019     01/28/2024    4:17 AM 01/27/2024    6:50 AM 01/26/2024    4:18 AM  Last 3 Weights  Weight (lbs) 178 lb 2.1 oz 178 lb 2.1 oz 176 lb 2.4 oz  Weight (kg) 80.8 kg 80.8 kg 79.9 kg      Telemetry    SR- no further NSVT- Personally Reviewed  Physical Exam   GEN: Well nourished, well developed in no acute distress HEENT: Normal NECK: No JVD CARDIAC:RRR, no murmurs, rubs, gallops RESPIRATORY:  Clear to auscultation without rales, wheezing or rhonchi  MUSCULOSKELETAL:  No edema; No deformity  SKIN: Warm and dry NEUROLOGIC:  Alert and oriented x 3 PSYCHIATRIC:  Normal affect   Labs    High Sensitivity Troponin:   Recent Labs  Lab 01/23/24 0925 01/23/24 1454  TROPONINIHS 126* 125*      Chemistry Recent Labs  Lab 01/23/24 0847 01/25/24 0232 01/26/24 0252 01/27/24 0219 01/28/24 0210  NA 138   < > 137 136 136  K 4.1   < > 3.7 3.7 3.8  CL 101   < > 103 104 104  CO2 27   < > 24 25 23   GLUCOSE 126*   < > 115* 112* 106*  BUN 7   < > 12 14 15   CREATININE 1.10   < > 0.96 1.04 1.17  CALCIUM  9.2   < > 8.8* 8.9 9.1  PROT 7.2  --   --   --   --   ALBUMIN 3.5  --   --   --   --   AST 16  --   --   --   --   ALT 11  --   --   --   --   ALKPHOS 81  --   --   --   --   BILITOT 0.8  --   --   --   --   GFRNONAA >60   < > >60 >60 >60  ANIONGAP 10   < > 10 7 9    < > = values in this interval not displayed.     Hematology Recent Labs  Lab 01/26/24 0252 01/27/24 0219 01/28/24 0210  WBC 6.8 7.8 6.9  RBC 5.43 5.27 5.40  HGB 15.0 14.5 14.7  HCT 44.8 43.6 44.6  MCV 82.5 82.7 82.6  MCH 27.6 27.5 27.2  MCHC 33.5 33.3 33.0  RDW 12.5 12.4 12.4  PLT 336 324 323    BNP Recent  Labs  Lab 01/23/24 0925  BNP 77.0     DDimer  Recent Labs  Lab 01/23/24 1106  DDIMER 0.38      Patient Profile     53 y.o. male  with a hx of CVA ( left pontine and right paramedian pontine 2018), type 2 diabetes, hypertension and adjustment disorder who is being seen 01/23/2024 for the evaluation of elevated troponin at the request of [name], MD.   Assessment & Plan    Coronary artery disease Multivessel coronary artery disease with significant blockages confirmed on cardiac CT and heart catheterization. Currently asymptomatic for non-sustained ventricular tachycardia. Planned for Plavix  washout and coronary artery bypass surgery on Friday. Cardiothoracic surgeons concur with discharge and outpatient follow-up. Shared decision-making with him and cardiothoracic surgeons regarding discharge plan. - Discharge home with outpatient follow-up. - Continue aspirin  therapy. - Hold clopidogrel  due to upcoming surgery. - Discontinued heparin  today. - Ambulate once more to ensure absence of cardiac symptoms. - Educated on red flag symptoms and recommend emergent ED evaluation if symptoms occur.  Hypertension Hypertension well-controlled on amlodipine , lisinopril , and carvedilol . Home hydrochlorothiazide  is being held. - Continue amlodipine . - Continue lisinopril . - Continue carvedilol  instead of atenolol . - Do not discharge on home hydrochlorothiazide .  Diabetes mellitus Diabetes management includes metformin , which is reasonable to restart. Metformin  should be stopped on the day of surgery. - Restart metformin . - Stop metformin  on the day of surgery.  Hyperlipidemia Hyperlipidemia management includes atorvastatin . Plan to increase atorvastatin  to 80 mg. - Increase atorvastatin  to 80 mg (started inpatient continue as outpatient)  Stroke Stroke considered in the management plan. Aspirin  therapy is continued for stroke prevention. - Continue aspirin  therapy and staitn  therapy  DC Planning:  - Discussed with Dr. Sherene Dilling and Luna Salinas patient and nursing - no further CP, stable hemodynamics no further testing needed prior to [place] CABG - planned for discharge later today after shared decision making meeting     For questions or updates, please contact [facility] Please consult [email or web address] for contact info under    Gloriann Larger, MD FASE [facility] Cardiologist [facility]  [facility]  [address] [place], [place] [address] [phone]  10:19 AM

## 2024-01-28 NOTE — Discharge Instructions (Signed)
 Do NOT take Clopidogrel  (Plavix ) You can take Metformin  but do not take on Friday June 20 before your surgery.

## 2024-01-29 ENCOUNTER — Encounter: Payer: Self-pay | Admitting: *Deleted

## 2024-01-29 ENCOUNTER — Other Ambulatory Visit: Payer: Self-pay | Admitting: *Deleted

## 2024-01-29 ENCOUNTER — Telehealth: Payer: Self-pay

## 2024-01-29 DIAGNOSIS — I2510 Atherosclerotic heart disease of native coronary artery without angina pectoris: Secondary | ICD-10-CM

## 2024-01-29 DIAGNOSIS — K219 Gastro-esophageal reflux disease without esophagitis: Secondary | ICD-10-CM

## 2024-01-29 LAB — LIPOPROTEIN A (LPA): Lipoprotein (a): 254.2 nmol/L — ABNORMAL HIGH (ref <75.0)

## 2024-01-29 NOTE — Transitions of Care (Post Inpatient/ED Visit) (Signed)
 01/29/2024  Name: Zachary [name] MRN: [id number] DOB: 1971-04-09  Today's TOC FU Call Status: Today's TOC FU Call Status:: Successful TOC FU Call Completed TOC FU Call Complete Date: 01/29/24 Patient's Name and Date of Birth confirmed.  Transition Care Management Follow-up Telephone Call Date of Discharge: 01/28/24 Discharge Facility: [facility]) Type of Discharge: Inpatient Admission Primary Inpatient Discharge Diagnosis:: unstable angina How have you been since you were released from the hospital?: Better Any questions or concerns?: No  Items Reviewed: Did you receive and understand the discharge instructions provided?: Yes Medications obtained,verified, and reconciled?: Yes (Medications Reviewed) (He does not have a glucometer) Any new allergies since your discharge?: No Dietary orders reviewed?: Yes Type of Diet Ordered:: heart healthy, low sodium,diabetic Do you have support at home?: Yes People in Home [RPT]: child(ren), adult Name of Support/Comfort Primary Source: his son and daughter are with him  Medications Reviewed Today: Medications Reviewed Today     Reviewed by Burnett Carson, RN (Case Manager) on 01/29/24 at 1718  Med List Status: <None>   Medication Order Taking? Sig Documenting Provider Last Dose Status Informant  amLODipine  (NORVASC ) 10 MG tablet [id number]  Take 1 tablet (10 mg total) by mouth daily. Vessie Gouge  Active   aspirin  EC 81 MG tablet [id number]  Take 1 tablet (81 mg total) by mouth daily. Swallow whole. Marlyse Single T, PA-C  Active   atorvastatin  (LIPITOR) 80 MG tablet [id number]  Take 1 tablet (80 mg total) by mouth daily. Marlyse Single T, PA-C  Active   BD ULTRA-FINE LANCETS lancets [id number]  Use as instructed Lynne Sat, PA-C  Active   blood glucose meter kit and supplies KIT [id number]  Dispense based on patient and insurance preference. Use up to four times daily as directed. (FOR ICD-9 250.00, 250.01). Rawland Caddy, MD   Active   Blood Glucose Monitoring Suppl (TRUE METRIX METER) w/Device KIT [id number]  1 each by Does not apply route 3 (three) times daily. Lynne Sat, PA-C  Active   carvedilol  (COREG ) 12.5 MG tablet [id number]  Take 1 tablet (12.5 mg total) by mouth 2 (two) times daily. Marlyse Single T, PA-C  Active   clopidogrel  (PLAVIX ) 75 MG tablet [id number]  Take 1 tablet (75 mg total) by mouth daily. Dulce Gibbs M, [place]  Active   glucose blood (TRUE METRIX BLOOD GLUCOSE TEST) test strip [id number]  Use as instructed Fleming, Zelda W, NP  Active   lisinopril  (ZESTRIL ) 20 MG tablet [id number]  Take 1 tablet (20 mg total) by mouth daily. Marlyse Single T, PA-C  Active   metFORMIN  (GLUCOPHAGE ) 500 MG tablet [id number]  Take 2 tablets (1,000 mg total) by mouth 2 (two) times daily with a meal for 5 days. Do not take Friday morning February 02, 2024 Marlyse Single T, [place]  Active   omeprazole  (PRILOSEC) 20 MG capsule [id number]  Take 1 capsule (20 mg total) by mouth daily. Hassie Lint, PA-C  Active   TRUEplus Lancets 28G [place] [id number]  Use as instructed. Check blood glucose level by fingerstick twice per day. Collins Dean, NP  Active             Home Care and Equipment/Supplies: Were Home Health Services Ordered?: No Any new equipment or medical supplies ordered?: No  Functional Questionnaire: Do you need assistance with bathing/showering or dressing?: No Do you need assistance with meal preparation?: No Do you need assistance with eating?: No Do you have difficulty maintaining  continence: No Do you need assistance with getting out of bed/getting out of a chair/moving?: No Do you have difficulty managing or taking your medications?: No  Follow up appointments reviewed: PCP Follow-up appointment confirmed?: No MD Provider Line Number:[phone] Given: No (He will schedule an appointment with PCP after he has his CABG) Specialist Hospital Follow-up appointment confirmed?: Yes Date of  Specialist follow-up appointment?: 02/02/24 Follow-Up Specialty Provider:: scheduled for CABG Do you need transportation to your follow-up appointment?: No Do you understand care options if your condition(s) worsen?: Yes-patient verbalized understanding    SIGNATURE Burnett Carson, RN

## 2024-01-31 NOTE — Progress Notes (Addendum)
 Surgical Instructions   Your procedure is scheduled on Friday February 02, 2024. Report to [facility] Main Entrance A at 5:30 A.M., then check in with the Admitting office. Any questions or running late day of surgery: call [phone]  Questions prior to your surgery date: call [phone], Monday-Friday, 8am-4pm. If you experience any cold or flu symptoms such as cough, fever, chills, shortness of breath, etc. between now and your scheduled surgery, please notify us  at the above number.     Remember:  Do not eat or drink after midnight the night before your surgery  Take these medicines the morning of surgery with A SIP OF WATER  amLODipine  (NORVASC )  atorvastatin  (LIPITOR)  carvedilol  (COREG )   PER YOUR SURGEON'S INSTRUCTIONS  CONTINUE TO HOLD YOUR clopidogrel  (PLAVIX ).     DO NOT TAKE YOUR aspirin  THE MORNING OF SURGERY.     One week prior to surgery, STOP taking any Aleve, Naproxen, Ibuprofen, Motrin, Advil, Goody's, BC's, all herbal medications, fish oil, and non-prescription vitamins.   WHAT DO I DO ABOUT MY DIABETES MEDICATION?   Do not take oral diabetes medicines (pills) the morning of surgery.         PER YOUR SURGEON'S INSTRUCTIONS, HOLD YOUR  metFORMIN  (GLUCOPHAGE ) 2 DAYS PRIOR TO SURGERY WITH THE LAST DOSE BEING 01/30/2024.    The day of surgery, do not take other diabetes injectables, including Byetta (exenatide), Bydureon (exenatide ER), Victoza (liraglutide), or Trulicity (dulaglutide).  If your CBG is greater than 220 mg/dL, you may take  of your sliding scale (correction) dose of insulin .   HOW TO MANAGE YOUR DIABETES BEFORE AND AFTER SURGERY  Why is it important to control my blood sugar before and after surgery? Improving blood sugar levels before and after surgery helps healing and can limit problems. A way of improving blood sugar control is eating a healthy diet by:  Eating less sugar and carbohydrates  Increasing activity/exercise  Talking with  your doctor about reaching your blood sugar goals High blood sugars (greater than 180 mg/dL) can raise your risk of infections and slow your recovery, so you will need to focus on controlling your diabetes during the weeks before surgery. Make sure that the doctor who takes care of your diabetes knows about your planned surgery including the date and location.  How do I manage my blood sugar before surgery? Check your blood sugar at least 4 times a day, starting 2 days before surgery, to make sure that the level is not too high or low.  Check your blood sugar the morning of your surgery when you wake up and every 2 hours until you get to the Short Stay unit.  If your blood sugar is less than 70 mg/dL, you will need to treat for low blood sugar: Do not take insulin . Treat a low blood sugar (less than 70 mg/dL) with  cup of clear juice (cranberry or apple), 4 glucose tablets, OR glucose gel. Recheck blood sugar in 15 minutes after treatment (to make sure it is greater than 70 mg/dL). If your blood sugar is not greater than 70 mg/dL on recheck, call [phone] for further instructions. Report your blood sugar to the short stay nurse when you get to Short Stay.  If you are admitted to the hospital after surgery: Your blood sugar will be checked by the staff and you will probably be given insulin  after surgery (instead of oral diabetes medicines) to make sure you have good blood sugar levels. The goal for  blood sugar control after surgery is 80-180 mg/dL.                      Do NOT Smoke (Tobacco/Vaping) for 24 hours prior to your procedure.  If you use a CPAP at night, you may bring your mask/headgear for your overnight stay.   You will be asked to remove any contacts, glasses, piercing's, hearing aid's, dentures/partials prior to surgery. Please bring cases for these items if needed.    Patients discharged the day of surgery will not be allowed to drive home, and someone needs to stay  with them for 24 hours.  SURGICAL WAITING ROOM VISITATION Patients may have no more than 2 support people in the waiting area - these visitors may rotate.   Pre-op nurse will coordinate an appropriate time for 1 ADULT support person, who may not rotate, to accompany patient in pre-op.  Children under the age of 89 must have an adult with them who is not the patient and must remain in the main waiting area with an adult.  If the patient needs to stay at the hospital during part of their recovery, the visitor guidelines for inpatient rooms apply.  Please refer to the [facility] website for the visitor guidelines for any additional information.   If you received a COVID test during your pre-op visit  it is requested that you wear a mask when out in public, stay away from anyone that may not be feeling well and notify your surgeon if you develop symptoms. If you have been in contact with anyone that has tested positive in the last 10 days please notify you surgeon.      Pre-operative CHG Bathing Instructions   You can play a key role in reducing the risk of infection after surgery. Your skin needs to be as free of germs as possible. You can reduce the number of germs on your skin by washing with CHG (chlorhexidine gluconate) soap before surgery. CHG is an antiseptic soap that kills germs and continues to kill germs even after washing.   DO NOT use if you have an allergy to chlorhexidine/CHG or antibacterial soaps. If your skin becomes reddened or irritated, stop using the CHG and notify one of our RNs at [phone].              TAKE A SHOWER THE NIGHT BEFORE SURGERY AND THE DAY OF SURGERY    Please keep in mind the following:  You may shave your face before/day of surgery.  Place clean sheets on your bed the night before surgery Use a clean washcloth (not used since being washed) for each shower. DO NOT sleep with pet's night before surgery.  CHG Shower Instructions:  Wash your face  and private area with normal soap. If you choose to wash your hair, wash first with your normal shampoo.  After you use shampoo/soap, rinse your hair and body thoroughly to remove shampoo/soap residue.  Turn the water OFF and apply half the bottle of CHG soap to a CLEAN washcloth.  Apply CHG soap ONLY FROM YOUR NECK DOWN TO YOUR TOES (washing for 3-5 minutes)  DO NOT use CHG soap on face, private areas, open wounds, or sores.  Pay special attention to the area where your surgery is being performed.  If you are having back surgery, having someone wash your back for you may be helpful. Wait 2 minutes after CHG soap is applied, then you may rinse off the  CHG soap.  Pat dry with a clean towel  Put on clean pajamas    Additional instructions for the day of surgery: DO NOT APPLY any lotions, deodorants or cologne.   Do not wear jewelry  Do not bring valuables to the hospital. [facility] is not responsible for valuables/personal belongings. Put on clean/comfortable clothes.  Please brush your teeth.  Ask your nurse before applying any prescription medications to the skin.

## 2024-02-01 ENCOUNTER — Encounter (HOSPITAL_COMMUNITY): Payer: Self-pay

## 2024-02-01 ENCOUNTER — Ambulatory Visit (HOSPITAL_COMMUNITY)
Admission: RE | Admit: 2024-02-01 | Discharge: 2024-02-01 | Disposition: A | Discharge status: Home / Self Care | Source: Ambulatory Visit | Attending: Thoracic Surgery (Cardiothoracic Vascular Surgery) | Admitting: Thoracic Surgery (Cardiothoracic Vascular Surgery)

## 2024-02-01 ENCOUNTER — Encounter (HOSPITAL_COMMUNITY)
Admission: RE | Admit: 2024-02-01 | Discharge: 2024-02-01 | Discharge status: Home / Self Care | Source: Ambulatory Visit | Attending: Thoracic Surgery (Cardiothoracic Vascular Surgery)

## 2024-02-01 ENCOUNTER — Other Ambulatory Visit: Payer: Self-pay

## 2024-02-01 ENCOUNTER — Ambulatory Visit (HOSPITAL_COMMUNITY)
Admission: RE | Admit: 2024-02-01 | Discharge: 2024-02-01 | Discharge status: Home / Self Care | Source: Ambulatory Visit | Attending: Thoracic Surgery (Cardiothoracic Vascular Surgery) | Admitting: Thoracic Surgery (Cardiothoracic Vascular Surgery)

## 2024-02-01 VITALS — BP 125/87 | HR 70 | Temp 98.4°F | Resp 18 | Ht 70.0 in | Wt 178.9 lb

## 2024-02-01 DIAGNOSIS — Z01818 Encounter for other preprocedural examination: Secondary | ICD-10-CM

## 2024-02-01 DIAGNOSIS — I2510 Atherosclerotic heart disease of native coronary artery without angina pectoris: Secondary | ICD-10-CM | POA: Insufficient documentation

## 2024-02-01 LAB — COMPREHENSIVE METABOLIC PANEL WITH GFR
ALT: 20 U/L (ref 0–44)
AST: 19 U/L (ref 15–41)
Albumin: 3.7 g/dL (ref 3.5–5.0)
Alkaline Phosphatase: 63 U/L (ref 38–126)
Anion gap: 10 (ref 5–15)
BUN: 11 mg/dL (ref 6–20)
CO2: 28 mmol/L (ref 22–32)
Calcium: 9.7 mg/dL (ref 8.9–10.3)
Chloride: 102 mmol/L (ref 98–111)
Creatinine, Ser: 1.1 mg/dL (ref 0.61–1.24)
GFR, Estimated: >60 mL/min (ref >60)
Glucose, Bld: 119 mg/dL — ABNORMAL HIGH (ref 70–99)
Potassium: 4.5 mmol/L (ref 3.5–5.1)
Sodium: 140 mmol/L (ref 135–145)
Total Bilirubin: 0.7 mg/dL (ref 0.0–1.2)
Total Protein: 7.9 g/dL (ref 6.5–8.1)

## 2024-02-01 LAB — TYPE AND SCREEN
ABO/RH(D): B POS
Antibody Screen: NEGATIVE

## 2024-02-01 LAB — CBC
HCT: 47.4 % (ref 39.0–52.0)
Hemoglobin: 15.1 g/dL (ref 13.0–17.0)
MCH: 27.2 pg (ref 26.0–34.0)
MCHC: 31.9 g/dL (ref 30.0–36.0)
MCV: 85.3 fL (ref 80.0–100.0)
Platelets: 366 10*3/uL (ref 150–400)
RBC: 5.56 MIL/uL (ref 4.22–5.81)
RDW: 12.6 % (ref 11.5–15.5)
WBC: 5.8 10*3/uL (ref 4.0–10.5)
nRBC: 0 % (ref 0.0–0.2)

## 2024-02-01 LAB — SURGICAL PCR SCREEN
MRSA, PCR: NEGATIVE
Staphylococcus aureus: NEGATIVE

## 2024-02-01 LAB — APTT: aPTT: 30 s (ref 24–36)

## 2024-02-01 LAB — GLUCOSE, CAPILLARY: Glucose-Capillary: 108 mg/dL — ABNORMAL HIGH (ref 70–99)

## 2024-02-01 LAB — PROTIME-INR
INR: 1.1 (ref 0.8–1.2)
Prothrombin Time: 14.4 s (ref 11.4–15.2)

## 2024-02-01 MED ORDER — NITROGLYCERIN IN D5W 200-5 MCG/ML-% IV SOLN
2.0000 ug/min | INTRAVENOUS | Status: AC
  Administered 2024-02-02: 15 ug/min via INTRAVENOUS
  Filled 2024-02-01: qty 250

## 2024-02-01 MED ORDER — TRANEXAMIC ACID 1000 MG/10ML IV SOLN
1.5000 mg/kg/h | INTRAVENOUS | Status: AC
  Administered 2024-02-02: 1.5 mg/kg/h via INTRAVENOUS
  Filled 2024-02-01: qty 25

## 2024-02-01 MED ORDER — DEXMEDETOMIDINE HCL IN NACL 400 MCG/100ML IV SOLN
0.1000 ug/kg/h | INTRAVENOUS | Status: AC
  Administered 2024-02-02: .3 ug/kg/h via INTRAVENOUS
  Filled 2024-02-01: qty 100

## 2024-02-01 MED ORDER — EPINEPHRINE HCL 5 MG/250ML IV SOLN IN NS
0.0000 ug/min | INTRAVENOUS | Status: DC
  Filled 2024-02-01: qty 250

## 2024-02-01 MED ORDER — PLASMA-LYTE A IV SOLN
INTRAVENOUS | Status: DC
  Filled 2024-02-01: qty 2.5

## 2024-02-01 MED ORDER — VANCOMYCIN HCL 1250 MG/250ML IV SOLN
1250.0000 mg | INTRAVENOUS | Status: AC
  Administered 2024-02-02: 1250 mg via INTRAVENOUS
  Filled 2024-02-01: qty 250

## 2024-02-01 MED ORDER — POTASSIUM CHLORIDE 2 MEQ/ML IV SOLN
80.0000 meq | INTRAVENOUS | Status: DC
  Filled 2024-02-01: qty 40

## 2024-02-01 MED ORDER — NOREPINEPHRINE 4 MG/250ML-% IV SOLN
0.0000 ug/min | INTRAVENOUS | Status: DC
  Filled 2024-02-01: qty 250

## 2024-02-01 MED ORDER — PHENYLEPHRINE HCL-NACL 20-0.9 MG/250ML-% IV SOLN
30.0000 ug/min | INTRAVENOUS | Status: AC
  Administered 2024-02-02: 10 ug/min via INTRAVENOUS
  Filled 2024-02-01: qty 250

## 2024-02-01 MED ORDER — MILRINONE LACTATE IN DEXTROSE 20-5 MG/100ML-% IV SOLN
0.3000 ug/kg/min | INTRAVENOUS | Status: DC
  Filled 2024-02-01: qty 100

## 2024-02-01 MED ORDER — TRANEXAMIC ACID (OHS) PUMP PRIME SOLUTION
2.0000 mg/kg | INTRAVENOUS | Status: DC
  Filled 2024-02-01: qty 1.62

## 2024-02-01 MED ORDER — TRANEXAMIC ACID (OHS) BOLUS VIA INFUSION
15.0000 mg/kg | INTRAVENOUS | Status: AC
  Administered 2024-02-02: 1216.5 mg via INTRAVENOUS
  Filled 2024-02-01: qty 1217

## 2024-02-01 MED ORDER — INSULIN REGULAR(HUMAN) IN NACL 100-0.9 UT/100ML-% IV SOLN
INTRAVENOUS | Status: AC
  Administered 2024-02-02: 2 [IU]/h via INTRAVENOUS
  Filled 2024-02-01: qty 100

## 2024-02-01 MED ORDER — HEPARIN 30,000 UNITS/1000 ML (OHS) CELLSAVER SOLUTION
Status: DC
  Filled 2024-02-01: qty 1000

## 2024-02-01 MED ORDER — MAGNESIUM SULFATE 50 % IJ SOLN
40.0000 meq | INTRAMUSCULAR | Status: DC
  Filled 2024-02-01: qty 9.85

## 2024-02-01 MED ORDER — CEFAZOLIN SODIUM-DEXTROSE 2-4 GM/100ML-% IV SOLN
2.0000 g | INTRAVENOUS | Status: AC
  Administered 2024-02-02 (×2): 2 g via INTRAVENOUS
  Filled 2024-02-01: qty 100

## 2024-02-01 MED ORDER — CEFAZOLIN SODIUM-DEXTROSE 2-4 GM/100ML-% IV SOLN
2.0000 g | INTRAVENOUS | Status: DC
  Filled 2024-02-01: qty 100

## 2024-02-01 NOTE — Progress Notes (Signed)
 PCP - Okey Berg Cardiologist - Leary Provencal  PPM/ICD - denies Device Orders -  Rep Notified -   Chest x-ray - 02/01/24 EKG - 01/24/24 Stress Test - denies ECHO - 01/23/24 Cardiac Cath - 01/25/24  Sleep Study - denies CPAP -   Fasting Blood Sugar - pt says he does not check his blood sugar but he agrees to check it the morning of surgery before he comes to the hospital.  Checks Blood Sugar _____ times a day  Last dose of GLP1 agonist-  na GLP1 instructions:   Blood Thinner Instructions: paused since Sunday 01/28/24.  Aspirin  Instructions:continue. Hold DOS.  ERAS Protcol -no PRE-SURGERY Ensure or G2-   COVID TEST- na   Anesthesia review: yes- recent hospital admission 6/10-6/15/25-elevated troponin  Patient denies shortness of breath, fever, cough and chest pain at PAT appointment   All instructions explained to the patient, with a verbal understanding of the material. Patient agrees to go over the instructions while at home for a better understanding. Patient also instructed to self quarantine after being tested for COVID-19. The opportunity to ask questions was provided.

## 2024-02-01 NOTE — Anesthesia Preprocedure Evaluation (Signed)
 Anesthesia Evaluation  Patient identified by MRN, date of birth, ID band Patient awake    Reviewed: Allergy & Precautions, NPO status , Patient's Chart, lab work & pertinent test results, reviewed documented beta blocker date and time   History of Anesthesia Complications Negative for: history of anesthetic complications  Airway Mallampati: II  TM Distance: >3 FB Neck ROM: Full    Dental  (+) Dental Advisory Given   Pulmonary neg pulmonary ROS   Pulmonary exam normal        Cardiovascular hypertension, Pt. on medications and Pt. on home beta blockers + CAD  Normal cardiovascular exam   '25 Cath - Severe three-vessel CAD, as detailed below, including CTO's of mid LAD and distal RCA, as well as severe D1 and OM2 stenoses.  '25 TTE - EF 55 to 60%. RWMA+. There is mild left ventricular hypertrophy. Grade I diastolic dysfunction (impaired relaxation). Small apical, inferior wall motion abnormality confirmed on strain imaging. Trivial mitral valve regurgitation.       Neuro/Psych  PSYCHIATRIC DISORDERS      CVA, Residual Symptoms    GI/Hepatic Neg liver ROS,GERD  Medicated and Controlled,,  Endo/Other  diabetes, Type 2, Oral Hypoglycemic Agents    Renal/GU negative Renal ROS     Musculoskeletal negative musculoskeletal ROS (+)    Abdominal   Peds  Hematology negative hematology ROS (+)   Anesthesia Other Findings   Reproductive/Obstetrics                             Anesthesia Physical Anesthesia Plan  ASA: 4  Anesthesia Plan: General   Post-op Pain Management:    Induction: Intravenous  PONV Risk Score and Plan: 2 and Treatment may vary due to age or medical condition  Airway Management Planned: Oral ETT  Additional Equipment: Arterial line, CVP, PA Cath, TEE and Ultrasound Guidance Line Placement  Intra-op Plan:   Post-operative Plan: Post-operative  intubation/ventilation  Informed Consent: I have reviewed the patients History and Physical, chart, labs and discussed the procedure including the risks, benefits and alternatives for the proposed anesthesia with the patient or authorized representative who has indicated his/her understanding and acceptance.     Dental advisory given  Plan Discussed with: CRNA and Anesthesiologist  Anesthesia Plan Comments: (Left radial artery harvest planned. Arterial line must be placed on right arm)        Anesthesia Quick Evaluation

## 2024-02-02 ENCOUNTER — Inpatient Hospital Stay (HOSPITAL_COMMUNITY)
Admission: RE | Admit: 2024-02-02 | Discharge: 2024-02-06 | DRG: 236 | Disposition: A | Discharge status: Home / Self Care | Attending: Thoracic Surgery (Cardiothoracic Vascular Surgery) | Admitting: Thoracic Surgery (Cardiothoracic Vascular Surgery)

## 2024-02-02 ENCOUNTER — Encounter (HOSPITAL_COMMUNITY): Payer: Self-pay | Admitting: Thoracic Surgery (Cardiothoracic Vascular Surgery)

## 2024-02-02 ENCOUNTER — Encounter (HOSPITAL_COMMUNITY)
Admission: RE | Disposition: A | Discharge status: Home / Self Care | Payer: Self-pay | Source: Home / Self Care | Attending: Thoracic Surgery (Cardiothoracic Vascular Surgery)

## 2024-02-02 ENCOUNTER — Inpatient Hospital Stay (HOSPITAL_COMMUNITY): Payer: Self-pay

## 2024-02-02 ENCOUNTER — Ambulatory Visit (HOSPITAL_COMMUNITY)

## 2024-02-02 ENCOUNTER — Other Ambulatory Visit: Payer: Self-pay

## 2024-02-02 ENCOUNTER — Inpatient Hospital Stay (HOSPITAL_COMMUNITY)

## 2024-02-02 DIAGNOSIS — I25118 Atherosclerotic heart disease of native coronary artery with other forms of angina pectoris: Principal | ICD-10-CM | POA: Diagnosis present

## 2024-02-02 DIAGNOSIS — R2689 Other abnormalities of gait and mobility: Secondary | ICD-10-CM | POA: Diagnosis present

## 2024-02-02 DIAGNOSIS — I319 Disease of pericardium, unspecified: Secondary | ICD-10-CM | POA: Diagnosis not present

## 2024-02-02 DIAGNOSIS — I69398 Other sequelae of cerebral infarction: Secondary | ICD-10-CM | POA: Diagnosis not present

## 2024-02-02 DIAGNOSIS — J9811 Atelectasis: Secondary | ICD-10-CM | POA: Diagnosis not present

## 2024-02-02 DIAGNOSIS — D62 Acute posthemorrhagic anemia: Secondary | ICD-10-CM | POA: Diagnosis not present

## 2024-02-02 DIAGNOSIS — J90 Pleural effusion, not elsewhere classified: Secondary | ICD-10-CM | POA: Diagnosis not present

## 2024-02-02 DIAGNOSIS — E119 Type 2 diabetes mellitus without complications: Secondary | ICD-10-CM

## 2024-02-02 DIAGNOSIS — I2582 Chronic total occlusion of coronary artery: Secondary | ICD-10-CM | POA: Diagnosis present

## 2024-02-02 DIAGNOSIS — E785 Hyperlipidemia, unspecified: Secondary | ICD-10-CM | POA: Diagnosis present

## 2024-02-02 DIAGNOSIS — K219 Gastro-esophageal reflux disease without esophagitis: Secondary | ICD-10-CM | POA: Diagnosis present

## 2024-02-02 DIAGNOSIS — F4322 Adjustment disorder with anxiety: Secondary | ICD-10-CM | POA: Diagnosis present

## 2024-02-02 DIAGNOSIS — I639 Cerebral infarction, unspecified: Secondary | ICD-10-CM | POA: Diagnosis not present

## 2024-02-02 DIAGNOSIS — Z01818 Encounter for other preprocedural examination: Secondary | ICD-10-CM

## 2024-02-02 DIAGNOSIS — E1165 Type 2 diabetes mellitus with hyperglycemia: Secondary | ICD-10-CM | POA: Diagnosis present

## 2024-02-02 DIAGNOSIS — Z79899 Other long term (current) drug therapy: Secondary | ICD-10-CM

## 2024-02-02 DIAGNOSIS — Z7902 Long term (current) use of antithrombotics/antiplatelets: Secondary | ICD-10-CM

## 2024-02-02 DIAGNOSIS — I10 Essential (primary) hypertension: Secondary | ICD-10-CM | POA: Diagnosis present

## 2024-02-02 DIAGNOSIS — I2510 Atherosclerotic heart disease of native coronary artery without angina pectoris: Secondary | ICD-10-CM | POA: Diagnosis present

## 2024-02-02 DIAGNOSIS — I252 Old myocardial infarction: Secondary | ICD-10-CM | POA: Diagnosis not present

## 2024-02-02 DIAGNOSIS — E876 Hypokalemia: Secondary | ICD-10-CM | POA: Diagnosis not present

## 2024-02-02 DIAGNOSIS — K5900 Constipation, unspecified: Secondary | ICD-10-CM | POA: Diagnosis not present

## 2024-02-02 DIAGNOSIS — Z7984 Long term (current) use of oral hypoglycemic drugs: Secondary | ICD-10-CM

## 2024-02-02 DIAGNOSIS — Z951 Presence of aortocoronary bypass graft: Principal | ICD-10-CM

## 2024-02-02 HISTORY — PX: INTRAOPERATIVE TRANSESOPHAGEAL ECHOCARDIOGRAM: SHX5062

## 2024-02-02 HISTORY — PX: CORONARY ARTERY BYPASS GRAFT: SHX141

## 2024-02-02 LAB — POCT I-STAT 7, (LYTES, BLD GAS, ICA,H+H)
Acid-base deficit: 3 mmol/L — ABNORMAL HIGH (ref 0.0–2.0)
Acid-base deficit: 3 mmol/L — ABNORMAL HIGH (ref 0.0–2.0)
Acid-base deficit: 2 mmol/L (ref 0.0–2.0)
Acid-base deficit: 1 mmol/L (ref 0.0–2.0)
Acid-base deficit: 8 mmol/L — ABNORMAL HIGH (ref 0.0–2.0)
Acid-base deficit: 6 mmol/L — ABNORMAL HIGH (ref 0.0–2.0)
Bicarbonate: 22.9 mmol/L (ref 20.0–28.0)
Bicarbonate: 21.2 mmol/L (ref 20.0–28.0)
Bicarbonate: 23.1 mmol/L (ref 20.0–28.0)
Bicarbonate: 23.6 mmol/L (ref 20.0–28.0)
Bicarbonate: 17.3 mmol/L — ABNORMAL LOW (ref 20.0–28.0)
Bicarbonate: 19 mmol/L — ABNORMAL LOW (ref 20.0–28.0)
Calcium, Ion: 1.2 mmol/L (ref 1.15–1.40)
Calcium, Ion: 0.99 mmol/L — ABNORMAL LOW (ref 1.15–1.40)
Calcium, Ion: 1.09 mmol/L — ABNORMAL LOW (ref 1.15–1.40)
Calcium, Ion: 1.08 mmol/L — ABNORMAL LOW (ref 1.15–1.40)
Calcium, Ion: 1.23 mmol/L (ref 1.15–1.40)
Calcium, Ion: 1.21 mmol/L (ref 1.15–1.40)
HCT: 39 % (ref 39.0–52.0)
HCT: 26 % — ABNORMAL LOW (ref 39.0–52.0)
HCT: 28 % — ABNORMAL LOW (ref 39.0–52.0)
HCT: 28 % — ABNORMAL LOW (ref 39.0–52.0)
HCT: 29 % — ABNORMAL LOW (ref 39.0–52.0)
HCT: 29 % — ABNORMAL LOW (ref 39.0–52.0)
Hemoglobin: 13.3 g/dL (ref 13.0–17.0)
Hemoglobin: 8.8 g/dL — ABNORMAL LOW (ref 13.0–17.0)
Hemoglobin: 9.5 g/dL — ABNORMAL LOW (ref 13.0–17.0)
Hemoglobin: 9.5 g/dL — ABNORMAL LOW (ref 13.0–17.0)
Hemoglobin: 9.9 g/dL — ABNORMAL LOW (ref 13.0–17.0)
Hemoglobin: 9.9 g/dL — ABNORMAL LOW (ref 13.0–17.0)
O2 Saturation: 100 %
O2 Saturation: 100 %
O2 Saturation: 100 %
O2 Saturation: 100 %
O2 Saturation: 100 %
O2 Saturation: 99 %
Patient temperature: 37.8
Potassium: 3.5 mmol/L (ref 3.5–5.1)
Potassium: 3.8 mmol/L (ref 3.5–5.1)
Potassium: 4.6 mmol/L (ref 3.5–5.1)
Potassium: 3.8 mmol/L (ref 3.5–5.1)
Potassium: 4.4 mmol/L (ref 3.5–5.1)
Potassium: 4.2 mmol/L (ref 3.5–5.1)
Sodium: 140 mmol/L (ref 135–145)
Sodium: 138 mmol/L (ref 135–145)
Sodium: 138 mmol/L (ref 135–145)
Sodium: 138 mmol/L (ref 135–145)
Sodium: 137 mmol/L (ref 135–145)
Sodium: 138 mmol/L (ref 135–145)
TCO2: 24 mmol/L (ref 22–32)
TCO2: 22 mmol/L (ref 22–32)
TCO2: 24 mmol/L (ref 22–32)
TCO2: 25 mmol/L (ref 22–32)
TCO2: 18 mmol/L — ABNORMAL LOW (ref 22–32)
TCO2: 20 mmol/L — ABNORMAL LOW (ref 22–32)
pCO2 arterial: 41.5 mmHg (ref 32–48)
pCO2 arterial: 33.5 mmHg (ref 32–48)
pCO2 arterial: 38.2 mmHg (ref 32–48)
pCO2 arterial: 38.1 mmHg (ref 32–48)
pCO2 arterial: 32.1 mmHg (ref 32–48)
pCO2 arterial: 35.1 mmHg (ref 32–48)
pH, Arterial: 7.35 (ref 7.35–7.45)
pH, Arterial: 7.41 (ref 7.35–7.45)
pH, Arterial: 7.389 (ref 7.35–7.45)
pH, Arterial: 7.4 (ref 7.35–7.45)
pH, Arterial: 7.339 — ABNORMAL LOW (ref 7.35–7.45)
pH, Arterial: 7.344 — ABNORMAL LOW (ref 7.35–7.45)
pO2, Arterial: 458 mmHg — ABNORMAL HIGH (ref 83–108)
pO2, Arterial: 436 mmHg — ABNORMAL HIGH (ref 83–108)
pO2, Arterial: 271 mmHg — ABNORMAL HIGH (ref 83–108)
pO2, Arterial: 463 mmHg — ABNORMAL HIGH (ref 83–108)
pO2, Arterial: 174 mmHg — ABNORMAL HIGH (ref 83–108)
pO2, Arterial: 169 mmHg — ABNORMAL HIGH (ref 83–108)

## 2024-02-02 LAB — BASIC METABOLIC PANEL WITH GFR
Anion gap: 13 (ref 5–15)
BUN: 11 mg/dL (ref 6–20)
CO2: 17 mmol/L — ABNORMAL LOW (ref 22–32)
Calcium: 8.8 mg/dL — ABNORMAL LOW (ref 8.9–10.3)
Chloride: 107 mmol/L (ref 98–111)
Creatinine, Ser: 1.02 mg/dL (ref 0.61–1.24)
GFR, Estimated: >60 mL/min (ref >60)
Glucose, Bld: 132 mg/dL — ABNORMAL HIGH (ref 70–99)
Potassium: 4.6 mmol/L (ref 3.5–5.1)
Sodium: 137 mmol/L (ref 135–145)

## 2024-02-02 LAB — CBC
HCT: 31.9 % — ABNORMAL LOW (ref 39.0–52.0)
HCT: 31.4 % — ABNORMAL LOW (ref 39.0–52.0)
Hemoglobin: 10.6 g/dL — ABNORMAL LOW (ref 13.0–17.0)
Hemoglobin: 10.5 g/dL — ABNORMAL LOW (ref 13.0–17.0)
MCH: 27.2 pg (ref 26.0–34.0)
MCH: 28 pg (ref 26.0–34.0)
MCHC: 33.2 g/dL (ref 30.0–36.0)
MCHC: 33.4 g/dL (ref 30.0–36.0)
MCV: 82 fL (ref 80.0–100.0)
MCV: 83.7 fL (ref 80.0–100.0)
Platelets: 214 10*3/uL (ref 150–400)
Platelets: 257 10*3/uL (ref 150–400)
RBC: 3.89 MIL/uL — ABNORMAL LOW (ref 4.22–5.81)
RBC: 3.75 MIL/uL — ABNORMAL LOW (ref 4.22–5.81)
RDW: 12.4 % (ref 11.5–15.5)
RDW: 12.4 % (ref 11.5–15.5)
WBC: 11.7 10*3/uL — ABNORMAL HIGH (ref 4.0–10.5)
WBC: 12.6 10*3/uL — ABNORMAL HIGH (ref 4.0–10.5)
nRBC: 0 % (ref 0.0–0.2)
nRBC: 0 % (ref 0.0–0.2)

## 2024-02-02 LAB — POCT I-STAT EG7
Acid-base deficit: 2 mmol/L (ref 0.0–2.0)
Bicarbonate: 22.3 mmol/L (ref 20.0–28.0)
Calcium, Ion: 1.06 mmol/L — ABNORMAL LOW (ref 1.15–1.40)
HCT: 27 % — ABNORMAL LOW (ref 39.0–52.0)
Hemoglobin: 9.2 g/dL — ABNORMAL LOW (ref 13.0–17.0)
O2 Saturation: 89 %
Potassium: 3.8 mmol/L (ref 3.5–5.1)
Sodium: 138 mmol/L (ref 135–145)
TCO2: 23 mmol/L (ref 22–32)
pCO2, Ven: 36.2 mmHg — ABNORMAL LOW (ref 44–60)
pH, Ven: 7.399 (ref 7.25–7.43)
pO2, Ven: 56 mmHg — ABNORMAL HIGH (ref 32–45)

## 2024-02-02 LAB — POCT I-STAT, CHEM 8
BUN: 11 mg/dL (ref 6–20)
BUN: 11 mg/dL (ref 6–20)
BUN: 10 mg/dL (ref 6–20)
BUN: 10 mg/dL (ref 6–20)
BUN: 10 mg/dL (ref 6–20)
Calcium, Ion: 1.22 mmol/L (ref 1.15–1.40)
Calcium, Ion: 1.24 mmol/L (ref 1.15–1.40)
Calcium, Ion: 1.1 mmol/L — ABNORMAL LOW (ref 1.15–1.40)
Calcium, Ion: 1.09 mmol/L — ABNORMAL LOW (ref 1.15–1.40)
Calcium, Ion: 1.08 mmol/L — ABNORMAL LOW (ref 1.15–1.40)
Chloride: 104 mmol/L (ref 98–111)
Chloride: 103 mmol/L (ref 98–111)
Chloride: 101 mmol/L (ref 98–111)
Chloride: 101 mmol/L (ref 98–111)
Chloride: 101 mmol/L (ref 98–111)
Creatinine, Ser: 0.8 mg/dL (ref 0.61–1.24)
Creatinine, Ser: 1 mg/dL (ref 0.61–1.24)
Creatinine, Ser: 0.9 mg/dL (ref 0.61–1.24)
Creatinine, Ser: 0.8 mg/dL (ref 0.61–1.24)
Creatinine, Ser: 0.8 mg/dL (ref 0.61–1.24)
Glucose, Bld: 115 mg/dL — ABNORMAL HIGH (ref 70–99)
Glucose, Bld: 126 mg/dL — ABNORMAL HIGH (ref 70–99)
Glucose, Bld: 100 mg/dL — ABNORMAL HIGH (ref 70–99)
Glucose, Bld: 106 mg/dL — ABNORMAL HIGH (ref 70–99)
Glucose, Bld: 136 mg/dL — ABNORMAL HIGH (ref 70–99)
HCT: 39 % (ref 39.0–52.0)
HCT: 36 % — ABNORMAL LOW (ref 39.0–52.0)
HCT: 28 % — ABNORMAL LOW (ref 39.0–52.0)
HCT: 26 % — ABNORMAL LOW (ref 39.0–52.0)
HCT: 28 % — ABNORMAL LOW (ref 39.0–52.0)
Hemoglobin: 13.3 g/dL (ref 13.0–17.0)
Hemoglobin: 12.2 g/dL — ABNORMAL LOW (ref 13.0–17.0)
Hemoglobin: 9.5 g/dL — ABNORMAL LOW (ref 13.0–17.0)
Hemoglobin: 8.8 g/dL — ABNORMAL LOW (ref 13.0–17.0)
Hemoglobin: 9.5 g/dL — ABNORMAL LOW (ref 13.0–17.0)
Potassium: 3.6 mmol/L (ref 3.5–5.1)
Potassium: 3.9 mmol/L (ref 3.5–5.1)
Potassium: 4.3 mmol/L (ref 3.5–5.1)
Potassium: 4.5 mmol/L (ref 3.5–5.1)
Potassium: 3.8 mmol/L (ref 3.5–5.1)
Sodium: 141 mmol/L (ref 135–145)
Sodium: 137 mmol/L (ref 135–145)
Sodium: 137 mmol/L (ref 135–145)
Sodium: 136 mmol/L (ref 135–145)
Sodium: 138 mmol/L (ref 135–145)
TCO2: 27 mmol/L (ref 22–32)
TCO2: 27 mmol/L (ref 22–32)
TCO2: 26 mmol/L (ref 22–32)
TCO2: 26 mmol/L (ref 22–32)
TCO2: 26 mmol/L (ref 22–32)

## 2024-02-02 LAB — GLUCOSE, CAPILLARY
Glucose-Capillary: 104 mg/dL — ABNORMAL HIGH (ref 70–99)
Glucose-Capillary: 103 mg/dL — ABNORMAL HIGH (ref 70–99)
Glucose-Capillary: 92 mg/dL (ref 70–99)
Glucose-Capillary: 114 mg/dL — ABNORMAL HIGH (ref 70–99)
Glucose-Capillary: 95 mg/dL (ref 70–99)
Glucose-Capillary: 60 mg/dL — ABNORMAL LOW (ref 70–99)
Glucose-Capillary: 71 mg/dL (ref 70–99)
Glucose-Capillary: 76 mg/dL (ref 70–99)
Glucose-Capillary: 138 mg/dL — ABNORMAL HIGH (ref 70–99)
Glucose-Capillary: 161 mg/dL — ABNORMAL HIGH (ref 70–99)
Glucose-Capillary: 157 mg/dL — ABNORMAL HIGH (ref 70–99)
Glucose-Capillary: 149 mg/dL — ABNORMAL HIGH (ref 70–99)

## 2024-02-02 LAB — PLATELET COUNT: Platelets: 261 10*3/uL (ref 150–400)

## 2024-02-02 LAB — ECHO INTRAOPERATIVE TEE
Height: 70 in
Weight: 2862.42 [oz_av]

## 2024-02-02 LAB — ABO/RH: ABO/RH(D): B POS

## 2024-02-02 LAB — HEMOGLOBIN AND HEMATOCRIT, BLOOD
HCT: 29.2 % — ABNORMAL LOW (ref 39.0–52.0)
Hemoglobin: 9.6 g/dL — ABNORMAL LOW (ref 13.0–17.0)

## 2024-02-02 LAB — APTT: aPTT: 32 s (ref 24–36)

## 2024-02-02 LAB — PROTIME-INR
INR: 1.5 — ABNORMAL HIGH (ref 0.8–1.2)
Prothrombin Time: 17.9 s — ABNORMAL HIGH (ref 11.4–15.2)

## 2024-02-02 SURGERY — CORONARY ARTERY BYPASS GRAFTING (CABG)
Anesthesia: General | Site: Chest

## 2024-02-02 MED ORDER — DEXTROSE 50 % IV SOLN: 0.0000 mL | INTRAVENOUS | Status: DC | PRN

## 2024-02-02 MED ORDER — FENTANYL CITRATE (PF) 250 MCG/5ML IJ SOLN
INTRAMUSCULAR | Status: AC
  Filled 2024-02-02: qty 5

## 2024-02-02 MED ORDER — LACTATED RINGERS IV SOLN: INTRAVENOUS | Status: AC

## 2024-02-02 MED ORDER — ROCURONIUM BROMIDE 10 MG/ML (PF) SYRINGE
PREFILLED_SYRINGE | INTRAVENOUS | Status: DC | PRN
  Administered 2024-02-02: 20 mg via INTRAVENOUS
  Administered 2024-02-02: 50 mg via INTRAVENOUS
  Administered 2024-02-02: 100 mg via INTRAVENOUS
  Administered 2024-02-02: 50 mg via INTRAVENOUS

## 2024-02-02 MED ORDER — HEPARIN SODIUM (PORCINE) 1000 UNIT/ML IJ SOLN
INTRAMUSCULAR | Status: DC | PRN
  Administered 2024-02-02: 2000 [IU] via INTRAVENOUS
  Administered 2024-02-02: 27000 [IU] via INTRAVENOUS

## 2024-02-02 MED ORDER — CALCIUM CHLORIDE 10 % IV SOLN
1.0000 g | Freq: Once | INTRAVENOUS | Status: AC
  Administered 2024-02-02: 1 g via INTRAVENOUS

## 2024-02-02 MED ORDER — PANTOPRAZOLE SODIUM 40 MG PO TBEC
40.0000 mg | DELAYED_RELEASE_TABLET | Freq: Every day | ORAL | Status: DC
  Administered 2024-02-04 – 2024-02-06 (×3): 40 mg via ORAL
  Filled 2024-02-02 (×3): qty 1

## 2024-02-02 MED ORDER — SUGAMMADEX SODIUM 200 MG/2ML IV SOLN
INTRAVENOUS | Status: DC | PRN
  Administered 2024-02-02: 200 mg via INTRAVENOUS

## 2024-02-02 MED ORDER — PROPOFOL 1000 MG/100ML IV EMUL
INTRAVENOUS | Status: AC
  Filled 2024-02-02: qty 100

## 2024-02-02 MED ORDER — ORAL CARE MOUTH RINSE: 15.0000 mL | Freq: Once | OROMUCOSAL | Status: AC

## 2024-02-02 MED ORDER — METOPROLOL TARTRATE 12.5 MG HALF TABLET
12.5000 mg | ORAL_TABLET | Freq: Two times a day (BID) | ORAL | Status: DC
  Administered 2024-02-02: 12.5 mg via ORAL
  Filled 2024-02-02: qty 1

## 2024-02-02 MED ORDER — ROCURONIUM BROMIDE 10 MG/ML (PF) SYRINGE
PREFILLED_SYRINGE | INTRAVENOUS | Status: AC
  Filled 2024-02-02: qty 20

## 2024-02-02 MED ORDER — SODIUM CHLORIDE 0.9 % IV SOLN: 1.0000 g | Freq: Once | INTRAVENOUS | Status: DC

## 2024-02-02 MED ORDER — FENTANYL CITRATE (PF) 250 MCG/5ML IJ SOLN
INTRAMUSCULAR | Status: DC | PRN
  Administered 2024-02-02: 100 ug via INTRAVENOUS
  Administered 2024-02-02: 50 ug via INTRAVENOUS
  Administered 2024-02-02 (×4): 100 ug via INTRAVENOUS
  Administered 2024-02-02: 50 ug via INTRAVENOUS
  Administered 2024-02-02 (×2): 100 ug via INTRAVENOUS
  Administered 2024-02-02: 150 ug via INTRAVENOUS
  Administered 2024-02-02 (×2): 100 ug via INTRAVENOUS
  Administered 2024-02-02 (×2): 50 ug via INTRAVENOUS

## 2024-02-02 MED ORDER — SODIUM CHLORIDE 0.9% FLUSH: 3.0000 mL | INTRAVENOUS | Status: DC | PRN

## 2024-02-02 MED ORDER — MORPHINE SULFATE (PF) 2 MG/ML IV SOLN: 1.0000 mg | INTRAVENOUS | Status: DC | PRN

## 2024-02-02 MED ORDER — SODIUM CHLORIDE 0.45 % IV SOLN
INTRAVENOUS | Status: AC | PRN
  Administered 2024-02-02: 20 mL/h via INTRAVENOUS

## 2024-02-02 MED ORDER — TRAMADOL HCL 50 MG PO TABS: 50.0000 mg | ORAL_TABLET | ORAL | Status: DC | PRN

## 2024-02-02 MED ORDER — PLASMA-LYTE A IV SOLN: INTRAVENOUS | Status: DC | PRN

## 2024-02-02 MED ORDER — ALBUMIN HUMAN 5 % IV SOLN: INTRAVENOUS | Status: DC | PRN

## 2024-02-02 MED ORDER — MIDAZOLAM HCL (PF) 10 MG/2ML IJ SOLN
INTRAMUSCULAR | Status: AC
  Filled 2024-02-02: qty 2

## 2024-02-02 MED ORDER — LACTATED RINGERS IV SOLN: INTRAVENOUS | Status: DC | PRN

## 2024-02-02 MED ORDER — PANTOPRAZOLE SODIUM 40 MG IV SOLR
40.0000 mg | Freq: Every day | INTRAVENOUS | Status: AC
  Administered 2024-02-02 – 2024-02-03 (×2): 40 mg via INTRAVENOUS
  Filled 2024-02-02 (×2): qty 10

## 2024-02-02 MED ORDER — ACETAMINOPHEN 160 MG/5ML PO SOLN
1000.0000 mg | Freq: Four times a day (QID) | ORAL | Status: AC
Start: 2024-02-03 — End: 2024-02-07

## 2024-02-02 MED ORDER — CEFAZOLIN SODIUM-DEXTROSE 2-4 GM/100ML-% IV SOLN
2.0000 g | Freq: Three times a day (TID) | INTRAVENOUS | Status: AC
  Administered 2024-02-02 – 2024-02-04 (×6): 2 g via INTRAVENOUS
  Filled 2024-02-02 (×6): qty 100

## 2024-02-02 MED ORDER — DEXMEDETOMIDINE HCL IN NACL 400 MCG/100ML IV SOLN
0.0000 ug/kg/h | INTRAVENOUS | Status: DC
  Filled 2024-02-02: qty 100

## 2024-02-02 MED ORDER — CHLORHEXIDINE GLUCONATE 0.12 % MT SOLN: 15.0000 mL | Freq: Once | OROMUCOSAL | Status: AC

## 2024-02-02 MED ORDER — NITROGLYCERIN 0.2 MG/ML ON CALL CATH LAB
INTRAVENOUS | Status: DC | PRN
  Administered 2024-02-02: 20 ug via INTRAVENOUS

## 2024-02-02 MED ORDER — MIDAZOLAM HCL (PF) 5 MG/ML IJ SOLN
INTRAMUSCULAR | Status: DC | PRN
  Administered 2024-02-02 (×5): 2 mg via INTRAVENOUS

## 2024-02-02 MED ORDER — LACTATED RINGERS IV SOLN: INTRAVENOUS | Status: DC

## 2024-02-02 MED ORDER — ACETAMINOPHEN 160 MG/5ML PO SOLN
650.0000 mg | Freq: Once | ORAL | Status: AC
  Administered 2024-02-02: 650 mg
  Filled 2024-02-02: qty 20.3

## 2024-02-02 MED ORDER — ALBUMIN HUMAN 5 % IV SOLN
250.0000 mL | INTRAVENOUS | Status: AC | PRN
  Administered 2024-02-02 (×5): 12.5 g via INTRAVENOUS
  Filled 2024-02-02 (×2): qty 250

## 2024-02-02 MED ORDER — PHENYLEPHRINE HCL-NACL 20-0.9 MG/250ML-% IV SOLN: 0.0000 ug/min | INTRAVENOUS | Status: DC

## 2024-02-02 MED ORDER — SODIUM CHLORIDE 0.9 % IV SOLN: 250.0000 mL | INTRAVENOUS | Status: DC

## 2024-02-02 MED ORDER — SODIUM CHLORIDE 0.9 % IV SOLN: INTRAVENOUS | Status: DC | PRN

## 2024-02-02 MED ORDER — PROPOFOL 10 MG/ML IV BOLUS
INTRAVENOUS | Status: DC | PRN
  Administered 2024-02-02: 30 mg via INTRAVENOUS
  Administered 2024-02-02: 100 mg via INTRAVENOUS
  Administered 2024-02-02: 20 mg via INTRAVENOUS

## 2024-02-02 MED ORDER — METOCLOPRAMIDE HCL 5 MG/ML IJ SOLN
10.0000 mg | Freq: Four times a day (QID) | INTRAMUSCULAR | Status: AC
  Administered 2024-02-02 – 2024-02-03 (×6): 10 mg via INTRAVENOUS
  Filled 2024-02-02 (×6): qty 2

## 2024-02-02 MED ORDER — ONDANSETRON HCL 4 MG/2ML IJ SOLN: 4.0000 mg | Freq: Four times a day (QID) | INTRAMUSCULAR | Status: DC | PRN

## 2024-02-02 MED ORDER — MAGNESIUM SULFATE 4 GM/100ML IV SOLN
4.0000 g | Freq: Once | INTRAVENOUS | Status: AC
  Administered 2024-02-02: 4 g via INTRAVENOUS
  Filled 2024-02-02: qty 100

## 2024-02-02 MED ORDER — METOPROLOL TARTRATE 5 MG/5ML IV SOLN
2.5000 mg | INTRAVENOUS | Status: DC | PRN
  Administered 2024-02-02 – 2024-02-03 (×2): 5 mg via INTRAVENOUS
  Filled 2024-02-02 (×2): qty 5

## 2024-02-02 MED ORDER — NOREPINEPHRINE 4 MG/250ML-% IV SOLN
INTRAVENOUS | Status: AC
  Filled 2024-02-02: qty 250

## 2024-02-02 MED ORDER — 0.9 % SODIUM CHLORIDE (POUR BTL) OPTIME
TOPICAL | Status: DC | PRN
  Administered 2024-02-02: 6000 mL

## 2024-02-02 MED ORDER — VANCOMYCIN HCL IN DEXTROSE 1-5 GM/200ML-% IV SOLN
1000.0000 mg | Freq: Once | INTRAVENOUS | Status: AC
  Administered 2024-02-02: 1000 mg via INTRAVENOUS
  Filled 2024-02-02: qty 200

## 2024-02-02 MED ORDER — CHLORHEXIDINE GLUCONATE CLOTH 2 % EX PADS
6.0000 | MEDICATED_PAD | Freq: Every day | CUTANEOUS | Status: DC
  Administered 2024-02-02 – 2024-02-06 (×5): 6 via TOPICAL

## 2024-02-02 MED ORDER — PHENYLEPHRINE 80 MCG/ML (10ML) SYRINGE FOR IV PUSH (FOR BLOOD PRESSURE SUPPORT)
PREFILLED_SYRINGE | INTRAVENOUS | Status: AC
  Filled 2024-02-02: qty 10

## 2024-02-02 MED ORDER — PROPOFOL 10 MG/ML IV BOLUS
INTRAVENOUS | Status: AC
  Filled 2024-02-02: qty 20

## 2024-02-02 MED ORDER — EPINEPHRINE HCL 5 MG/250ML IV SOLN IN NS
INTRAVENOUS | Status: AC
  Filled 2024-02-02: qty 250

## 2024-02-02 MED ORDER — MIDAZOLAM HCL 2 MG/2ML IJ SOLN: 2.0000 mg | INTRAMUSCULAR | Status: DC | PRN

## 2024-02-02 MED ORDER — SODIUM CHLORIDE 0.9% FLUSH
3.0000 mL | Freq: Two times a day (BID) | INTRAVENOUS | Status: DC
  Administered 2024-02-03 – 2024-02-04 (×3): 3 mL via INTRAVENOUS

## 2024-02-02 MED ORDER — OXYCODONE HCL 5 MG PO TABS
5.0000 mg | ORAL_TABLET | ORAL | Status: DC | PRN
  Administered 2024-02-03: 10 mg via ORAL
  Filled 2024-02-02: qty 2

## 2024-02-02 MED ORDER — EPHEDRINE 5 MG/ML INJ
INTRAVENOUS | Status: AC
  Filled 2024-02-02: qty 5

## 2024-02-02 MED ORDER — HEPARIN SODIUM (PORCINE) 1000 UNIT/ML IJ SOLN
INTRAMUSCULAR | Status: AC
Start: 2024-02-02 — End: 2024-02-02
  Filled 2024-02-02: qty 1

## 2024-02-02 MED ORDER — METOPROLOL TARTRATE 25 MG/10 ML ORAL SUSPENSION: 12.5000 mg | Freq: Two times a day (BID) | ORAL | Status: DC

## 2024-02-02 MED ORDER — CHLORHEXIDINE GLUCONATE 4 % EX SOLN: 30.0000 mL | CUTANEOUS | Status: DC

## 2024-02-02 MED ORDER — CHLORHEXIDINE GLUCONATE 0.12 % MT SOLN
15.0000 mL | OROMUCOSAL | Status: AC
  Administered 2024-02-02: 15 mL via OROMUCOSAL
  Filled 2024-02-02: qty 15

## 2024-02-02 MED ORDER — ASPIRIN 81 MG PO CHEW
324.0000 mg | CHEWABLE_TABLET | Freq: Once | ORAL | Status: AC
  Administered 2024-02-02: 324 mg via ORAL
  Filled 2024-02-02: qty 4

## 2024-02-02 MED ORDER — NITROGLYCERIN IN D5W 200-5 MCG/ML-% IV SOLN
0.0000 ug/min | INTRAVENOUS | Status: DC
  Administered 2024-02-02: 5 ug/min via INTRAVENOUS

## 2024-02-02 MED ORDER — LACTATED RINGERS IV SOLN
INTRAVENOUS | Status: AC
Start: 2024-02-02 — End: 2024-02-03

## 2024-02-02 MED ORDER — ASPIRIN 325 MG PO TBEC
325.0000 mg | DELAYED_RELEASE_TABLET | Freq: Every day | ORAL | Status: DC
  Administered 2024-02-03: 325 mg via ORAL
  Filled 2024-02-02: qty 1

## 2024-02-02 MED ORDER — ASPIRIN 81 MG PO CHEW: 324.0000 mg | CHEWABLE_TABLET | Freq: Every day | ORAL | Status: DC

## 2024-02-02 MED ORDER — HEMOSTATIC AGENTS (NO CHARGE) OPTIME
TOPICAL | Status: DC | PRN
  Administered 2024-02-02: 1 via TOPICAL

## 2024-02-02 MED ORDER — NICARDIPINE HCL IN NACL 20-0.86 MG/200ML-% IV SOLN
3.0000 mg/h | INTRAVENOUS | Status: AC
  Administered 2024-02-03 (×2): 15 mg/h via INTRAVENOUS
  Administered 2024-02-03: 3 mg/h via INTRAVENOUS
  Administered 2024-02-03 (×3): 15 mg/h via INTRAVENOUS
  Administered 2024-02-03: 13 mg/h via INTRAVENOUS
  Filled 2024-02-02 (×5): qty 200
  Filled 2024-02-02: qty 400
  Filled 2024-02-02: qty 200

## 2024-02-02 MED ORDER — LIDOCAINE 2% (20 MG/ML) 5 ML SYRINGE
INTRAMUSCULAR | Status: AC
  Filled 2024-02-02: qty 5

## 2024-02-02 MED ORDER — ACETAMINOPHEN 500 MG PO TABS
1000.0000 mg | ORAL_TABLET | Freq: Four times a day (QID) | ORAL | Status: DC
  Administered 2024-02-02 – 2024-02-06 (×12): 1000 mg via ORAL
  Filled 2024-02-02 (×13): qty 2

## 2024-02-02 MED ORDER — METOPROLOL TARTRATE 12.5 MG HALF TABLET: 12.5000 mg | ORAL_TABLET | Freq: Once | ORAL | Status: DC

## 2024-02-02 MED ORDER — ORAL CARE MOUTH RINSE: 15.0000 mL | OROMUCOSAL | Status: DC | PRN

## 2024-02-02 MED ORDER — SODIUM CHLORIDE (PF) 0.9 % IJ SOLN: OROMUCOSAL | Status: DC | PRN

## 2024-02-02 MED ORDER — CHLORHEXIDINE GLUCONATE 0.12 % MT SOLN
OROMUCOSAL | Status: AC
  Administered 2024-02-02: 15 mL via OROMUCOSAL
  Filled 2024-02-02: qty 15

## 2024-02-02 MED ORDER — ATORVASTATIN CALCIUM 80 MG PO TABS
80.0000 mg | ORAL_TABLET | Freq: Every day | ORAL | Status: DC
  Administered 2024-02-03 – 2024-02-06 (×4): 80 mg via ORAL
  Filled 2024-02-02 (×5): qty 1

## 2024-02-02 MED ORDER — DOCUSATE SODIUM 100 MG PO CAPS
200.0000 mg | ORAL_CAPSULE | Freq: Every day | ORAL | Status: DC
  Administered 2024-02-03 – 2024-02-05 (×3): 200 mg via ORAL
  Filled 2024-02-02 (×4): qty 2

## 2024-02-02 MED ORDER — PHENYLEPHRINE 80 MCG/ML (10ML) SYRINGE FOR IV PUSH (FOR BLOOD PRESSURE SUPPORT)
PREFILLED_SYRINGE | INTRAVENOUS | Status: DC | PRN
  Administered 2024-02-02: 80 ug via INTRAVENOUS

## 2024-02-02 MED ORDER — SODIUM CHLORIDE 0.9 % IV SOLN
INTRAVENOUS | Status: AC
  Administered 2024-02-02: 10 mL/h via INTRAVENOUS

## 2024-02-02 MED ORDER — ROCURONIUM BROMIDE 10 MG/ML (PF) SYRINGE
PREFILLED_SYRINGE | INTRAVENOUS | Status: AC
  Filled 2024-02-02: qty 10

## 2024-02-02 MED ORDER — GLYCOPYRROLATE PF 0.2 MG/ML IJ SOSY
PREFILLED_SYRINGE | INTRAMUSCULAR | Status: AC
  Filled 2024-02-02: qty 1

## 2024-02-02 MED ORDER — POTASSIUM CHLORIDE 10 MEQ/50ML IV SOLN
10.0000 meq | INTRAVENOUS | Status: AC
  Administered 2024-02-02 (×3): 10 meq via INTRAVENOUS
  Filled 2024-02-02 (×3): qty 50

## 2024-02-02 MED ORDER — PROTAMINE SULFATE 10 MG/ML IV SOLN
INTRAVENOUS | Status: AC
  Filled 2024-02-02: qty 25

## 2024-02-02 MED ORDER — CHLORHEXIDINE GLUCONATE 0.12 % MT SOLN: 15.0000 mL | Freq: Once | OROMUCOSAL | Status: DC

## 2024-02-02 MED ORDER — INSULIN REGULAR(HUMAN) IN NACL 100-0.9 UT/100ML-% IV SOLN: INTRAVENOUS | Status: DC

## 2024-02-02 MED ORDER — BISACODYL 10 MG RE SUPP: 10.0000 mg | Freq: Every day | RECTAL | Status: DC

## 2024-02-02 MED ORDER — ~~LOC~~ CARDIAC SURGERY, PATIENT & FAMILY EDUCATION
Freq: Once | Status: DC
  Filled 2024-02-02: qty 1

## 2024-02-02 MED ORDER — PROTAMINE SULFATE 10 MG/ML IV SOLN
INTRAVENOUS | Status: AC
  Filled 2024-02-02: qty 10

## 2024-02-02 MED ORDER — BISACODYL 5 MG PO TBEC
10.0000 mg | DELAYED_RELEASE_TABLET | Freq: Every day | ORAL | Status: DC
  Administered 2024-02-03 – 2024-02-05 (×3): 10 mg via ORAL
  Filled 2024-02-02 (×4): qty 2

## 2024-02-02 MED ORDER — PROTAMINE SULFATE 10 MG/ML IV SOLN
INTRAVENOUS | Status: DC | PRN
  Administered 2024-02-02: 250 mg via INTRAVENOUS
  Administered 2024-02-02: 20 mg via INTRAVENOUS

## 2024-02-02 MED ORDER — NITROGLYCERIN IN D5W 200-5 MCG/ML-% IV SOLN
INTRAVENOUS | Status: AC
  Filled 2024-02-02: qty 250

## 2024-02-02 MED ORDER — ORAL CARE MOUTH RINSE
15.0000 mL | OROMUCOSAL | Status: DC
  Administered 2024-02-02 – 2024-02-04 (×21): 15 mL via OROMUCOSAL

## 2024-02-02 SURGICAL SUPPLY — 97 items
ADAPTER CARDIO PERF ANTE/RETRO (ADAPTER) ×1 IMPLANT
ADAPTER MULTI PERFUSION 15 (ADAPTER) ×3 IMPLANT
BAG DECANTER FOR FLEXI CONT (MISCELLANEOUS) ×3 IMPLANT
BLADE CLIPPER SURG (BLADE) ×3 IMPLANT
BLADE STERNUM SYSTEM 6 (BLADE) ×3 IMPLANT
BLADE SURG 11 STRL SS (BLADE) ×1 IMPLANT
BLADE SURG 15 STRL LF DISP TIS (BLADE) IMPLANT
BNDG ELASTIC 4INX 5YD STR LF (GAUZE/BANDAGES/DRESSINGS) ×1 IMPLANT
BNDG ELASTIC 4X5.8 VLCR STR LF (GAUZE/BANDAGES/DRESSINGS) ×3 IMPLANT
BNDG ELASTIC 6INX 5YD STR LF (GAUZE/BANDAGES/DRESSINGS) ×3 IMPLANT
BNDG GAUZE DERMACEA FLUFF 4 (GAUZE/BANDAGES/DRESSINGS) ×3 IMPLANT
CANISTER SUCTION 3000ML PPV (SUCTIONS) ×3 IMPLANT
CANNULA AORTIC ROOT 9FR (CANNULA) ×3 IMPLANT
CANNULA EZ GLIDE AORTIC 21FR (CANNULA) ×3 IMPLANT
CANNULA GUNDRY RCSP 15FR (MISCELLANEOUS) ×1 IMPLANT
CANNULA MC2 2 STG 36/46 NON-V (CANNULA) ×1 IMPLANT
CANNULA VESSEL 3MM BLUNT TIP (CANNULA) ×9 IMPLANT
CATH ROBINSON RED A/P 18FR (CATHETERS) ×4 IMPLANT
CATH THORACIC 36FR (CATHETERS) ×3 IMPLANT
CATH THORACIC 36FR RT ANG (CATHETERS) ×3 IMPLANT
CLIP APPLIE 9.375 SM OPEN (CLIP) IMPLANT
CLIP FOGARTY SPRING 6M (CLIP) ×1 IMPLANT
CLIP TI MEDIUM 24 (CLIP) IMPLANT
CLIP TI WIDE RED SMALL 24 (CLIP) ×2 IMPLANT
CONTAINER PROTECT SURGISLUSH (MISCELLANEOUS) ×6 IMPLANT
COVER MAYO STAND STRL (DRAPES) ×1 IMPLANT
CUFF TOURN SGL QUICK 18X4 (TOURNIQUET CUFF) IMPLANT
CUFF TRNQT CYL 24X4X16.5-23 (TOURNIQUET CUFF) IMPLANT
DERMABOND ADVANCED .7 DNX6 (GAUZE/BANDAGES/DRESSINGS) ×1 IMPLANT
DRAPE EXTREMITY T 121X128X90 (DISPOSABLE) ×3 IMPLANT
DRAPE HALF SHEET 40X57 (DRAPES) ×1 IMPLANT
DRAPE SRG 135X102X78XABS (DRAPES) ×3 IMPLANT
DRAPE WARM FLUID 44X44 (DRAPES) ×3 IMPLANT
DRSG COVADERM 4X14 (GAUZE/BANDAGES/DRESSINGS) ×3 IMPLANT
ELECTRODE REM PT RTRN 9FT ADLT (ELECTROSURGICAL) ×6 IMPLANT
FELT TEFLON 1X6 (MISCELLANEOUS) ×5 IMPLANT
GAUZE 4X4 16PLY ~~LOC~~+RFID DBL (SPONGE) ×2 IMPLANT
GAUZE SPONGE 4X4 12PLY STRL (GAUZE/BANDAGES/DRESSINGS) ×7 IMPLANT
GEL ULTRASOUND 20GR AQUASONIC (MISCELLANEOUS) IMPLANT
GLOVE SS BIOGEL STRL SZ 7.5 (GLOVE) ×3 IMPLANT
GOWN STRL REUS W/ TWL LRG LVL3 (GOWN DISPOSABLE) ×12 IMPLANT
GOWN STRL REUS W/ TWL XL LVL3 (GOWN DISPOSABLE) ×6 IMPLANT
HEMOSTAT POWDER SURGIFOAM 1G (HEMOSTASIS) ×10 IMPLANT
HEMOSTAT SURGICEL 2X14 (HEMOSTASIS) ×3 IMPLANT
INSERT FOGARTY XLG (MISCELLANEOUS) IMPLANT
KIT BASIN OR (CUSTOM PROCEDURE TRAY) ×3 IMPLANT
KIT SUCTION CATH 14FR (SUCTIONS) ×6 IMPLANT
KIT TURNOVER KIT B (KITS) ×3 IMPLANT
KIT VASOVIEW ACCESSORY VH 2004 (KITS) ×1 IMPLANT
KIT VASOVIEW HEMOPRO 2 VH 4000 (KITS) ×3 IMPLANT
MARKER GRAFT CORONARY BYPASS (MISCELLANEOUS) ×9 IMPLANT
NS IRRIG 1000ML POUR BTL (IV SOLUTION) ×16 IMPLANT
PACK E OPEN HEART (SUTURE) ×3 IMPLANT
PACK OPEN HEART (CUSTOM PROCEDURE TRAY) ×3 IMPLANT
PAD ARMBOARD POSITIONER FOAM (MISCELLANEOUS) ×6 IMPLANT
PAD ELECT DEFIB RADIOL ZOLL (MISCELLANEOUS) ×3 IMPLANT
PENCIL BUTTON HOLSTER BLD 10FT (ELECTRODE) ×3 IMPLANT
POSITIONER HEAD DONUT 9IN (MISCELLANEOUS) ×3 IMPLANT
PUNCH AORTIC ROTATE 4.0MM (MISCELLANEOUS) IMPLANT
PUNCH AORTIC ROTATE 4.5MM 8IN (MISCELLANEOUS) ×1 IMPLANT
PUNCH AORTIC ROTATE 5MM 8IN (MISCELLANEOUS) IMPLANT
SET MPS 3-ND DEL (MISCELLANEOUS) ×1 IMPLANT
SHEARS HARMONIC 9CM CVD (BLADE) ×3 IMPLANT
SOLUTION ANTFG W/FOAM PAD STRL (MISCELLANEOUS) ×1 IMPLANT
SPONGE T-LAP 18X18 ~~LOC~~+RFID (SPONGE) ×9 IMPLANT
SPONGE T-LAP 4X18 ~~LOC~~+RFID (SPONGE) ×2 IMPLANT
STOPCOCK 4 WAY LG BORE MALE ST (IV SETS) ×1 IMPLANT
SUPPORT HEART JANKE-BARRON (MISCELLANEOUS) ×3 IMPLANT
SUT BONE WAX W31G (SUTURE) ×3 IMPLANT
SUT MNCRL AB 4-0 PS2 18 (SUTURE) IMPLANT
SUT PROLENE 3 0 SH DA (SUTURE) ×2 IMPLANT
SUT PROLENE 4 0 SH DA (SUTURE) IMPLANT
SUT PROLENE 4-0 RB1 .5 CRCL 36 (SUTURE) ×1 IMPLANT
SUT PROLENE 6 0 C 1 30 (SUTURE) ×8 IMPLANT
SUT PROLENE 7 0 BV 1 (SUTURE) ×1 IMPLANT
SUT PROLENE 7 0 BV1 MDA (SUTURE) ×4 IMPLANT
SUT PROLENE 8 0 BV175 6 (SUTURE) ×1 IMPLANT
SUT STEEL 6MS V (SUTURE) ×3 IMPLANT
SUT STEEL STERNAL CCS#1 18IN (SUTURE) IMPLANT
SUT STEEL SZ 6 DBL 3X14 BALL (SUTURE) ×3 IMPLANT
SUT VIC AB 1 CTX36XBRD ANBCTR (SUTURE) ×6 IMPLANT
SUT VIC AB 2-0 CTX 27 (SUTURE) IMPLANT
SUT VIC AB 3-0 SH 27X BRD (SUTURE) IMPLANT
SUT VIC AB 3-0 X1 27 (SUTURE) IMPLANT
SYR 50ML SLIP (SYRINGE) IMPLANT
SYSTEM SAHARA CHEST DRAIN ATS (WOUND CARE) ×3 IMPLANT
TAPE CLOTH SURG 4X10 WHT LF (GAUZE/BANDAGES/DRESSINGS) ×2 IMPLANT
TAPE PAPER 2X10 WHT MICROPORE (GAUZE/BANDAGES/DRESSINGS) ×1 IMPLANT
TOWEL GREEN STERILE (TOWEL DISPOSABLE) ×3 IMPLANT
TOWEL GREEN STERILE FF (TOWEL DISPOSABLE) ×3 IMPLANT
TRAY FOLEY SLVR 14FR TEMP STAT (SET/KITS/TRAYS/PACK) ×1 IMPLANT
TRAY FOLEY SLVR 16FR TEMP STAT (SET/KITS/TRAYS/PACK) ×2 IMPLANT
TUBE SUCT INTRACARD DLP 20F (MISCELLANEOUS) ×3 IMPLANT
TUBE SUCTION CARDIAC 10FR (CANNULA) ×3 IMPLANT
TUBING LAP HI FLOW INSUFFLATIO (TUBING) ×3 IMPLANT
UNDERPAD 30X36 HEAVY ABSORB (UNDERPADS AND DIAPERS) ×3 IMPLANT
WATER STERILE IRR 1000ML POUR (IV SOLUTION) ×6 IMPLANT

## 2024-02-02 NOTE — Progress Notes (Signed)
      [address]       ,[place] [address]             [phone]      S/p CABG x 4  BP (!) 123/94   Pulse 81   Temp 98.8 F (37.1 C)   Resp 16   Ht 5' 10 (1.778 m)   Wt 81.1 kg   SpO2 100%   BMI 25.67 kg/m  21/14 CI 1.83 on NTG   Intake/Output Summary (Last 24 hours) at 02/02/2024 1648 Last data filed at 02/02/2024 1600 Gross per 24 hour  Intake 3387.93 ml  Output 1865 ml  Net 1522.93 ml   Hct 32 PLT 214K K 3.8  Doing well early postop Wean vent  Landon Pinion C. Luna Salinas, MD [facility] [phone]

## 2024-02-02 NOTE — Progress Notes (Signed)
  Echocardiogram Echocardiogram Transesophageal has been performed.  Dione Franks 02/02/2024, 2:33 PM

## 2024-02-02 NOTE — TOC Initial Note (Addendum)
 Transition of Care [facility]) - Initial/Assessment Note    Patient Details  Name: Zachary [name] MRN: [id number] Date of Birth: [birth year]  Transition of Care [facility] CM/SW Contact:    Benjiman Bras, RN Phone Number: [phone] 02/02/2024, 3:14 PM  Clinical Narrative:    Readmission              TOC CM spoke to pt's dtr and pt lives at home alone, Was independent pta.  Will continue to follow for dc needs.    Barriers to Discharge: Continued Medical Work up   Patient Goals and CMS Choice            Expected Discharge Plan and Services   Discharge Planning Services: CM Consult   Living arrangements for the past 2 months: Apartment                                      Prior Living Arrangements/Services Living arrangements for the past 2 months: Apartment Lives with:: Self                   Activities of Daily Living      Permission Sought/Granted                  Emotional Assessment   Attitude/Demeanor/Rapport: Intubated (Following Commands or Not Following Commands)          Admission diagnosis:  Coronary artery disease involving native coronary artery of native heart without angina pectoris [I25.10] S/P CABG x 4 [Z95.1] Patient Active Problem List   Diagnosis Date Noted   S/P CABG x 4 02/02/2024   Chest pain 01/26/2024   Coronary artery disease of native artery of native heart with stable angina pectoris (HCC) 01/26/2024   Regurgitation of food 01/24/2024   Unstable angina (HCC) 01/23/2024   Troponin level elevated 01/23/2024   Gastroesophageal reflux disease without esophagitis 01/23/2024   Hypertensive urgency 01/23/2024   Pure hypercholesterolemia 01/23/2024   History of CVA (cerebrovascular accident) 01/23/2024   Adjustment disorder with mixed anxiety and depressed mood    Sleep disturbance    Hyperlipidemia LDL goal <70    Hypokalemia    Hyponatremia    AKI (acute kidney injury) (HCC)    Diabetes mellitus type 2 in  nonobese (HCC)    Benign essential HTN    Gait disturbance, post-stroke 03/23/2017   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 03/18/2017   PCP:  Collins Dean, NP Pharmacy:   [facility] [address] [place] [address] Phone: [phone] Fax: [phone]     Social Drivers of Health (SDOH) Social History: SDOH Screenings   Food Insecurity: Patient Declined (01/24/2024)  Housing: Low Risk  (01/24/2024)  Transportation Needs: No Transportation Needs (01/24/2024)  Utilities: Not At Risk (01/24/2024)  Alcohol Screen: Low Risk  (08/02/2023)  Depression (PHQ2-9): Low Risk  (08/03/2023)  Financial Resource Strain: Patient Declined (08/02/2023)  Physical Activity: Unknown (08/02/2023)  Social Connections: Unknown (08/02/2023)  Stress: No Stress Concern Present (08/02/2023)  Tobacco Use: Low Risk  (02/02/2024)   SDOH Interventions:     Readmission Risk Interventions     No data to display

## 2024-02-02 NOTE — Interval H&P Note (Signed)
 History and Physical Interval Note:  Vascular lab doesn't correlate with Allen's test.  Maintains pulsatility on pulse ox with radial occlusion- will make final decision in OR 02/02/2024 7:16 AM  Zachary [name]  has presented today for surgery, with the diagnosis of CAD.  The various methods of treatment have been discussed with the patient and family. After consideration of risks, benefits and other options for treatment, the patient has consented to  Procedure(s): CORONARY ARTERY BYPASS GRAFTING (CABG) (N/A) SURGICAL PROCUREMENT, ARTERY, RADIAL (Left) ECHOCARDIOGRAM, TRANSESOPHAGEAL, INTRAOPERATIVE (N/A) as a surgical intervention.  The patient's history has been reviewed, patient examined, no change in status, stable for surgery.  I have reviewed the patient's chart and labs.  Questions were answered to the patient's satisfaction.     [name]

## 2024-02-02 NOTE — Brief Op Note (Signed)
 02/02/2024  4:34 PM  PATIENT:  Zachary [name]  53 y.o. male  PRE-OPERATIVE DIAGNOSIS:  CORONARY ARTERY DISEASE  POST-OPERATIVE DIAGNOSIS:  CORONARY ARTERY DISEASE  PROCEDURE:  Procedure(s): CORONARY ARTERY BYPASS GRAFTING X 4, USING LEFT INTERNAL MAMMARY ARTERY AND ENDOSCOPIC HARVESTED RIGHT GREATER SAPHENOUS VEIN (N/A) ECHOCARDIOGRAM, TRANSESOPHAGEAL, INTRAOPERATIVE (N/A) Vein harvest time: Vein prep time: -LIMA to LAD -SVG to acute marginal -SVG to OM -SVG to diagonal  SURGEON:  Surgeons and Role:    * [name], MD - Primary  PHYSICIAN ASSISTANT: Levora Reas PA-C, Margo Shells PA-S  ASSISTANTS: Elmira Haddock RNFA   ANESTHESIA:   general  EBL:  600 ml  BLOOD ADMINISTERED: cell saver  DRAINS: Mediastinal drains + left pleural  LOCAL MEDICATIONS USED:  NONE  SPECIMEN:  No Specimen  DISPOSITION OF SPECIMEN:  N/A  COUNTS:  YES  TOURNIQUET:  * No tourniquets in log *  DICTATION: .Dragon Dictation  PLAN OF CARE: Admit to inpatient   PATIENT DISPOSITION:  ICU - intubated and hemodynamically stable.   Delay start of Pharmacological VTE agent (>24hrs) due to surgical blood loss or risk of bleeding: yes

## 2024-02-02 NOTE — Anesthesia Procedure Notes (Signed)
 Central Venous Catheter Insertion Performed by: Juventino Oppenheim, MD, anesthesiologist Start/End6/20/2025 7:02 AM, 02/02/2024 7:14 AM Patient location: Pre-op. Preanesthetic checklist: patient identified, IV checked, risks and benefits discussed, surgical consent, monitors and equipment checked, pre-op evaluation, timeout performed and anesthesia consent Position: Trendelenburg Lidocaine  1% used for infiltration and patient sedated Hand hygiene performed , maximum sterile barriers used  and Seldinger technique used Catheter size: 8.5 Fr Central line was placed.Sheath introducer Procedure performed using ultrasound guided technique. Ultrasound Notes:anatomy identified, needle tip was noted to be adjacent to the nerve/plexus identified, no ultrasound evidence of intravascular and/or intraneural injection and image(s) printed for medical record Attempts: 1 Following insertion, line sutured, dressing applied and Biopatch. Post procedure assessment: blood return through all ports, free fluid flow and no air  Patient tolerated the procedure well with no immediate complications.

## 2024-02-02 NOTE — Anesthesia Procedure Notes (Signed)
 Central Venous Catheter Insertion Performed by: Juventino Oppenheim, MD, anesthesiologist Start/End6/20/2025 7:12 AM, 02/02/2024 7:14 AM Patient location: Pre-op. Preanesthetic checklist: patient identified, IV checked, risks and benefits discussed, surgical consent, monitors and equipment checked, pre-op evaluation, timeout performed and anesthesia consent Position: Trendelenburg Hand hygiene performed  and maximum sterile barriers used  Total catheter length 10. PA cath was placed.Swan type:thermodilution PA Cath depth:45 Procedure performed without using ultrasound guided technique. Attempts: 1 Patient tolerated the procedure well with no immediate complications.

## 2024-02-02 NOTE — Progress Notes (Signed)
 Pt assessed for extubation, positive cuff leak, VC 800 ml, NIF -20 x 3.

## 2024-02-02 NOTE — Anesthesia Procedure Notes (Signed)
 Procedure Name: Intubation Date/Time: 02/02/2024 7:39 AM  Performed by: Rochelle Chu, CRNAPre-anesthesia Checklist: Patient identified, Emergency Drugs available, Suction available and Patient being monitored Patient Re-evaluated:Patient Re-evaluated prior to induction Oxygen Delivery Method: Circle system utilized Preoxygenation: Pre-oxygenation with 100% oxygen Induction Type: IV induction Ventilation: Mask ventilation without difficulty Laryngoscope Size: Mac and 4 Grade View: Grade II Tube type: Oral Tube size: 8.0 mm Number of attempts: 2 Airway Equipment and Method: Stylet and Oral airway Placement Confirmation: ETT inserted through vocal cords under direct vision, positive ETCO2 and breath sounds checked- equal and bilateral Secured at: 23 cm Tube secured with: Tape Dental Injury: Teeth and Oropharynx as per pre-operative assessment

## 2024-02-02 NOTE — Op Note (Unsigned)
 NAME: Zachary [name], Zachary [name] MEDICAL RECORD NO: [id number] ACCOUNT NO: [id number] DATE OF BIRTH: 10/14/1970 FACILITY: [facility] LOCATION: [facility] PHYSICIAN: Milon Aloe. Luna Salinas, MD  Operative Report   DATE OF PROCEDURE: 02/02/2024  PREOPERATIVE DIAGNOSIS:  Severe three-vessel coronary artery disease.  POSTOPERATIVE DIAGNOSIS:  Severe three-vessel coronary artery disease.  PROCEDURES PERFORMED: 1.  Median sternotomy, extracorporeal circulation, coronary artery bypass grafting x 4 (left internal mammary artery to LAD, saphenous vein graft to first diagonal, saphenous vein graft to obtuse marginal 1, saphenous vein graft to acute marginal). 2.  Endoscopic vein harvest right leg.  SURGEON:  Milon Aloe. Luna Salinas, MD.  ASSISTANT:  Levora Reas, PA.  Experienced assistance was necessary for this case due to surgical complexity.  Levora Reas independently harvested the saphenous vein endoscopically and then closed the leg incision.  She then assisted with exposure, retraction of delicate tissues,  suctioning, and suture management during the anastomosis.  ANESTHESIA:  General.  FINDINGS:  Transesophageal echocardiography revealed preserved left ventricular function with no significant valvular pathology, unchanged post bypass, diffusely diseased, fair quality target vessels, posterior descending, small, diffusely diseased,  heavily calcified, not suitable for grafting.  CLINICAL NOTE:  The patient is a 53 year old man with multiple cardiac risk factors who presented with complaints of persistent nausea and vomiting.  He has had an extensive gastrointestinal workup, which was negative and the feeling was that this could  be an anginal equivalent.  Coronary CT was performed and he has had an elevated calcium  score of 1442.  Cardiac catheterization was performed and he was found to have three-vessel disease with a chronic totally occluded LAD and diffusely diseased  vessels.  He was referred for  coronary artery bypass grafting.  The indications, risks, benefits, and alternatives were discussed in detail with the patient.  He understood and accepts the risks and agreed to proceed.  The plan was to assess the radial  artery intraoperatively for possible use as a graft in addition to the left internal mammary artery and the saphenous veins.  He was not a candidate for bilateral mammary's with poorly controlled diabetes.  OPERATIVE NOTE:  The patient was brought to the preoperative holding area on 02/02/2024.  There, the anesthesia service under the direction of Dr. Hobart Lulas placed a Swan-Ganz catheter and an arterial blood pressure monitoring line.  Assessment of the  radial artery showed significant diminishment of pulse oximetry signal with radial occlusion.  Decision was made to reassess in the OR to see if it was a suitable conduit for use.  The patient was taken to the operating room, anesthetized and intubated.   A Foley catheter was placed.  Intravenous antibiotics were administered.  Dr. Glenetta Lane performed transesophageal echocardiography.  Please see his separately dictated note for full details of that procedure.  The chest, abdomen, legs, and left arm were  prepped and draped in the usual sterile fashion.  A timeout was performed.  An incision was made over the volar aspect of the left wrist.  A short segment of the radial artery was dissected out.  With proximal occlusion, there was a significant decrease, but the radial was a relatively small vessel.   There was a Doppler signal with radial occlusion, but it was not felt that this would be an adequate vessel to justify any degree of risk.  Therefore, the vessel was not harvested.  That incision was closed and the arm was tucked back to the patient's  side.  Simultaneously, an incision was made in the medial  aspect of the right leg.  The greater saphenous vein was identified and was harvested endoscopically from the upper calf to the  groin.  The saphenous vein was a good quality vessel.  A median  sternotomy was performed and the left internal mammary artery was harvested under direct vision.  The mammary artery was a good quality vessel with excellent flow and divided distally.  2000 units of heparin  was administered during the vessel harvest.   The remainder of full heparin  dose was given prior to opening the pericardium.  After the conduits had been prepared, the sternal retractor was placed and was gradually opened.  The pericardium was opened.  After confirming adequate anticoagulation with ACT measurement, the aorta was cannulated via concentric 2-0 Ethibond pledgeted  pursestring sutures.  A dual-stage venous cannula was placed via a pursestring suture in the right atrial appendage.  Cardiopulmonary bypass was initiated.  Flows were maintained per protocol.  The patient was cooled to 32 degrees Celsius.  The coronary  arteries were inspected and anastomotic sites were chosen.  The posterior descending was not a graftable vessel, but an acute marginal running parallel to it, it did appear to be satisfactory for bypass.  LAD was diffusely diseased, but there was an area  that was bypassable.  Both diagonal and OM1 appeared to be adequate targets.  The conduits were inspected and prepared.  A retrograde cardioplegia cannula was placed via a pursestring suture in the right atrium and directed into the coronary sinus.  A  foam pad was placed in the pericardium to insulate the heart.  A temperature probe was placed in the myocardial septum and a cardioplegic cannula was placed in the ascending aorta.  The aorta was cross-clamped.  The left ventricle was emptied via the aortic root vent.  Cardiac arrest then was achieved with a combination of cold, antegrade blood, cardioplegia, and topical ice saline was achieved with a combination of cold, antegrade  and retrograde cardioplegia and topical IV saline.  500 mL of cardioplegia was  administered antegrade.  There was a diastolic arrest.  An additional 500 mL was given retrograde.  There was septal cooling to 11 degrees Celsius.  A reverse saphenous vein graft was placed end to side to the first diagonal branch of the LAD.  This vessel was a 1.5 mm fair quality target at the site of the anastomosis.  The vein was anastomosed end to side with a running 7-0 Prolene suture.  A probe  passed easily proximally and distally. At the completion of each anastomosis, cardioplegia was administered down the graft and there was good flow and good hemostasis.  A reverse saphenous vein graft then was placed end to side to obtuse marginal 1.  This was a relatively small OM branch that bifurcated just beyond the anastomosis.  It did accept a 1.5-mm probe.  The vein was anastomosed end to side with a running 7-0  Prolene suture.  The inferior wall was inspected.  The final decision was made not to graft the posterior descending due to its poor quality and diffuse calcification.  The acute marginal parallel to it was a 1.5 mm vessel at the site of anastomosis.  An  end-to-side anastomosis was performed with a running 7-0 Prolene suture.  Cardioplegia was administered down the graft and there was good flow and good hemostasis.  The left internal mammary artery was brought through a window in the pericardium.  The distal limb was beveled.  It was anastomosed end  to side to the distal LAD.  The LAD was a diffusely diseased vessel, but there was a site that was relatively less  diseased where the graft was placed.  A 1.5-mm probe did pass proximally and distally from that site.  The anastomosis was performed with a running 8-0 Prolene suture.  At the completion of the mammary to LAD anastomosis, the bulldog clamp was briefly  removed and inspected for hemostasis.  Rapid septal rewarming was noted.  The bulldog clamp was replaced and the mammary pedicle was tacked to the epicardial surface of the heart with  6-0 Prolene sutures.  Additional retrograde cardioplegia was administered.  The vein grafts were cut to length.  The cardioplegia cannula was removed from the ascending aorta.  The proximal vein graft was anastomosed and these were performed with 4.5 mm punch aortotomies with  running 6-0 Prolene sutures.  At the completion of the final proximal anastomosis, the patient was placed in Trendelenburg position.  Lidocaine  was administered.  The aortic root was de-aired and the aortic cross clamp was removed.  The total cross  clamp time was 76 minutes.  The patient required single defibrillation with 10 joules and then, was in sinus rhythm thereafter.  While rewarming was completed, all proximal and distal anastomosis were inspected for hemostasis.  Epicardial pacing wires were placed on the right ventricle and right atrium.  When the patient was rewarmed to a core temperature of 37 degrees Celsius, he  was weaned from cardiopulmonary bypass on the first attempt.  Total bypass time was 110 minutes.  The initial cardiac index was relatively low but improved with volume resuscitation.  Transesophageal echocardiography showed good wall motion.  A test dose of protamine was administered and was well tolerated.  The atrial and aortic cannula were removed.  The remainder of the protamine was administered without incident.  The chest was irrigated with warm saline.  Hemostasis was achieved.  Left  pleural and mediastinal chest tubes were placed through separate subcostal incisions.  The pericardium was reapproximated with interrupted 3-0 silk suture.  It came together easily without tension.  The sternum was closed with a combination of single and  double heavy gauge stainless steel wires.  The pectoralis fascia and subcutaneous tissue and skin were closed in a standard fashion.  All sponge, needle, and instrument counts were correct at the end of the procedure.  The patient was taken from the  operating room to  the surgical intensive care unit, intubated and in fair condition.    MUK D: 02/02/2024 4:47:04 pm T: 02/02/2024 10:48:00 pm  JOB: [id number]/ [id number]

## 2024-02-02 NOTE — Hospital Course (Addendum)
 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  He presented to the ED on 01/23/2024 with complaints of N/V for a year.  The patient states it occurs almost every time he eats and feels like it is getting worse.  He states they had not been able to identify a cause from my stomach, so maybe it's my heart and he presented for evaluation.  Upon further questioning the patient denied chest pain at time of presentation but states he tends to experience chest pain shortly after vomiting episodes.  Workup in ED showed no cardiopulmonary abnormalities on CXR.  His troponin level was minimally elevated, labs were unremarkable, EKG showed evidence of previous MI, however nothing acute.  He was noted to be non-compliant with blood pressure medications.  Echocardiogram obtained showed normal EF with small inferior apical wall motion abnormality.  Coronary CTA was also obtained and showed a score of 1442 with LAD, Diagonal, OM, and Ramus disease.  Due to this he underwent catheterization by Dr. Mady which revealed 3V CAD with no LM involvement, chronically occluded LAD which filled via collaterals.  It was felt he would benefit from coronary bypass grafting procedure and Cardiothoracic surgical consultation has been requested. Currently the patient denies chest pain, shortness of breath, and further N/V.  He is a non smoker.  He denies family history of CAD.  He is able to get around without difficulty.   Dr. Kerrin discussed the need for coronary artery bypass grafting surgery. Potential risks, benefits, and complications of the surgery were discussed with the patient and he agreed to proceed with surgery. Pre operative carotid duplex US  showed no significant internal carotid artery stenosis bilaterally.  Hospital Course: Zachary [name] presented to [facility] and was brought  to the operating room on 02/02/24. The patient underwent a CABG x 4 utilizing LIMA to LAD, SVG to acute marginal, SVG to OM, and SVG to Diagonal as well as endoscopic harvest of the right greater saphenous vein. His radial artery was not felt to be sufficient for arterial graft so it was not harvested. He tolerated the procedure well and was transported from the OR to [facility] ICU in stable condition. He was weaned Nicardipine  drip. He had RBBB on EKG post op which resolved but had J point elevation. As discussed with cardiology, **. [name], a line, chest tubes and foley were removed early in his post operative course. He was transitioned off the Insulin  drip. His pre op HGA1C was 6.6. he was above his pre op weight and was diuresed. He had expected post op blood loss anemia. He did not require a post op transfusion.

## 2024-02-02 NOTE — Progress Notes (Signed)
 Extubation Procedure Note  Patient Details:   Name: Zachary [name] DOB: 04-May-1971 MRN: [id number]   Airway Documentation:  Airway 8 mm (Active)  Secured at (cm) 23 cm 02/02/24 1945  Measured From Lips 02/02/24 1945  Secured Location Right 02/02/24 1945  Secured By [employer] Tape 02/02/24 1945  Bite Block No 02/02/24 1945  Prone position No 02/02/24 1945  Cuff Pressure (cm H2O) Green OR 18-26 CmH2O 02/02/24 1945  Site Condition Dry 02/02/24 1945     Evaluation  O2 sats: stable throughout Complications: No apparent complications Patient did tolerate procedure well. Bilateral Breath Sounds: Clear   Able to speak: Yes  Barnett Libel 02/02/2024, 9:57 PM

## 2024-02-02 NOTE — Discharge Summary (Signed)
 [address]       [place] [address]             [phone]    Physician Discharge Summary  Patient ID: Zachary [name] MRN: [id number] DOB/AGE: 55-Feb-1972 55 y.o.  Admit date: 02/02/2024 Discharge date: 02/06/2024  Admission Diagnoses:  Patient Active Problem List   Diagnosis Date Noted   S/P CABG x 4 02/02/2024   Chest pain 01/26/2024   Coronary artery disease of native artery of native heart with stable angina pectoris (HCC) 01/26/2024   Regurgitation of food 01/24/2024   Unstable angina (HCC) 01/23/2024   Troponin level elevated 01/23/2024   Gastroesophageal reflux disease without esophagitis 01/23/2024   Hypertensive urgency 01/23/2024   Pure hypercholesterolemia 01/23/2024   History of CVA (cerebrovascular accident) 01/23/2024   Adjustment disorder with mixed anxiety and depressed mood    Sleep disturbance    Hyperlipidemia LDL goal <70    Hypokalemia    Hyponatremia    AKI (acute kidney injury) (HCC)    Diabetes mellitus type 2 in nonobese (HCC)    Benign essential HTN    Gait disturbance, post-stroke 03/23/2017   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 03/18/2017     Discharge Diagnoses:  Patient Active Problem List   Diagnosis Date Noted   S/P CABG x 4 02/02/2024   Chest pain 01/26/2024   Coronary artery disease of native artery of native heart with stable angina pectoris (HCC) 01/26/2024   Regurgitation of food 01/24/2024   Unstable angina (HCC) 01/23/2024   Troponin level elevated 01/23/2024   Gastroesophageal reflux disease without esophagitis 01/23/2024   Hypertensive urgency 01/23/2024   Pure hypercholesterolemia 01/23/2024   History of CVA (cerebrovascular accident) 01/23/2024   Adjustment disorder with mixed anxiety and depressed mood    Sleep  disturbance    Hyperlipidemia LDL goal <70    Hypokalemia    Hyponatremia    AKI (acute kidney injury) (HCC)    Diabetes mellitus type 2 in nonobese (HCC)    Benign essential HTN    Gait disturbance, post-stroke 03/23/2017   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 03/18/2017     Discharged Condition: Stable  HPI:  Zachary [name] is a 53 yo AA male with known history of Adjusted mood disorder with anxiety, HTN, DM Type 2, HLD, Right and Left Pontine CVA with residual gait disturbance and GERD.  He presented to the ED on 01/23/2024 with complaints of N/V for a year.  The patient states it occurs almost every time he eats and feels like it is getting worse.  He states they had not been able to identify a cause from my stomach, so maybe it's my heart and he presented for evaluation.  Upon further questioning the patient denied chest pain at time of presentation but states he tends to experience chest pain shortly after vomiting episodes.  Workup in ED showed no cardiopulmonary abnormalities on CXR.  His troponin level was minimally elevated, labs were unremarkable, EKG showed evidence of previous MI, however nothing acute.  He was noted to be non-compliant with blood pressure medications.  Echocardiogram obtained showed normal EF with small inferior apical wall motion abnormality.  Coronary  CTA was also obtained and showed a score of 1442 with LAD, Diagonal, OM, and Ramus disease.  Due to this he underwent catheterization by Dr. Mady which revealed 3V CAD with no LM involvement, chronically occluded LAD which filled via collaterals.  It was felt he would benefit from coronary bypass grafting procedure and Cardiothoracic surgical consultation has been requested. Currently the patient denies chest pain, shortness of breath, and  further N/V.  He is a non smoker.  He denies family history of CAD.  He is able to get around without difficulty.   Dr. Kerrin discussed the need for coronary artery bypass grafting surgery. Potential risks, benefits, and complications of the surgery were discussed with the patient and he agreed to proceed with surgery. Pre operative carotid duplex US  showed no significant internal carotid artery stenosis bilaterally.  Hospital Course: [name] presented to [facility] and was brought to the operating room on 02/02/24. The patient underwent a CABG x 4 utilizing LIMA to LAD, SVG to acute marginal, SVG to OM, and SVG to Diagonal as well as endoscopic harvest of the right greater saphenous vein. His radial artery was not felt to be sufficient for arterial graft so it was not harvested. He tolerated the procedure well and was transported from the OR to [facility] ICU in stable condition. He was weaned Nicardipine  drip. He had RBBB on EKG post op which resolved but had J point elevation. 12 lead repeat EKG showed changes consistent with probable pericarditis. Bedside echocardiogram showed no significant wall motion abnormality.  [name], a line, chest tubes and foley were removed early in his post operative course. He was transitioned off the Insulin  drip. He was restarted on Metformin  and given low dose Insulin . His pre op HGA1C was 6.6. he was above his pre op weight and was diuresed. He had expected post op blood loss anemia. He did not require a post op transfusion. Epicardial pacing wires were removed on 06/22. Plavix  was started on 06/23. He was above his pre op weight and was given Lasix . He was stable for transfer from the ICU to 4E for further convalescence on 06/22. He continued to maintain sinus rhythm. He has been tolerating a diet and has had a bowel movement. Of note, Prilosec will not be restarted as on Plavix . All wounds are clean, dry, healing without signs of infection. He is felt stable  for discharge today.  Consults: None  Significant Diagnostic Studies:  Narrative & Impression  CLINICAL DATA:  Coronary artery bypass graft   EXAM: CHEST - 2 VIEW   COMPARISON:  Chest x-ray performed February 04, 2024   FINDINGS: Interval removal right-sided venous sheath. Postsurgical changes from sternotomy. Low lung volumes. Mild interstitial prominence. Minimal blunting of both costophrenic angles.   IMPRESSION: 1. No evidence of significant pneumothorax. 2. Trace bilateral pleural effusions. 3. Pulmonary vascular congestion, similar.     Electronically Signed   By: Maude Naegeli M.D.   On: 02/05/2024 07:40     Treatments: surgery:  1.  Median sternotomy, extracorporeal circulation, coronary artery bypass grafting x 4 (left internal mammary artery to LAD, saphenous vein graft to first diagonal, saphenous vein graft to obtuse marginal 1, saphenous vein graft to acute marginal). 2.  Endoscopic vein harvest right leg by Dr. Kerrin on 02/02/2024.  Discharge Exam: Blood pressure 114/81, pulse 92, temperature 98.9 F (37.2 C), temperature source Oral, resp. rate 19, height 5' 10 (1.778 m), weight 79.6 kg, SpO2 97%. Cardiovascular: RRR Pulmonary:  Slightly diminished bibasilar breath sounds Abdomen: Soft, non tender, bowel sounds present. Extremities: Trace lower extremity edema.  Wounds: Sternal, RLE, and LUE wounds are all clean and dry.  No erythema or signs of infection.   Discharge Medications:  The patient has been discharged on:   1.Beta Blocker:  Yes [ x  ]                              No   [   ]                              If No, reason:  2.Ace Inhibitor/ARB: Yes [   ]                                     No  [  x  ]                                     If No, reason: Will try to start as outpatient once BP allows  3.Statin:   Yes [ x  ]                  No  [   ]                  If No, reason:  4.Ecasa:  Yes  [ x  ]                  No   [   ]                   If No, reason:  Patient had ACS upon admission:  Plavix /P2Y12 inhibitor: Yes [  x ]                                      No  [   ]     Discharge Instructions     Amb Referral to Cardiac Rehabilitation   Complete by: As directed    Diagnosis: CABG   CABG X ___: 4   After initial evaluation and assessments completed: Virtual Based Care may be provided alone or in conjunction with Phase 2 Cardiac Rehab based on patient barriers.: Yes   Intensive Cardiac Rehabilitation (ICR) [facility] location only OR Traditional Cardiac Rehabilitation (TCR) *If criteria for ICR are not met will enroll in TCR ([facility] only): Yes      Allergies as of 02/06/2024   No Known Allergies      Medication List     STOP taking these medications    lisinopril  20 MG tablet Commonly known as: ZESTRIL    omeprazole  20 MG capsule Commonly known as: PRILOSEC       TAKE these medications    amLODipine  10 MG tablet Commonly known as: NORVASC  Take 1 tablet (10 mg total) by mouth daily.   aspirin  EC 81 MG tablet Take 1 tablet (81 mg total) by mouth daily. Swallow whole.   atorvastatin  80 MG tablet Commonly known as: LIPITOR  Take 1 tablet (80 mg total) by mouth daily.   BD Ultra-Fine Lancets lancets Use as instructed  TRUEplus Lancets 28G Misc Use as instructed. Check blood glucose level by fingerstick twice per day.   blood glucose meter kit and supplies Kit Dispense based on patient and insurance preference. Use up to four times daily as directed. (FOR ICD-9 250.00, 250.01).   carvedilol  12.5 MG tablet Commonly known as: COREG  Take 1 tablet (12.5 mg total) by mouth 2 (two) times daily.   clopidogrel  75 MG tablet Commonly known as: PLAVIX  Take 1 tablet (75 mg total) by mouth daily.   furosemide  40 MG tablet Commonly known as: LASIX  Take 1 tablet (40 mg total) by mouth daily. For 5 days then stop   metFORMIN  500 MG tablet Commonly known as: GLUCOPHAGE  Take 1 tablet (500 mg total)  by mouth 2 (two) times daily with a meal. What changed:  how much to take additional instructions   oxyCODONE  5 MG immediate release tablet Commonly known as: Oxy IR/ROXICODONE  Take 1 tablet (5 mg total) by mouth every 6 (six) hours as needed for severe pain (pain score 7-10).   potassium chloride  SA 20 MEQ tablet Commonly known as: KLOR-CON  M Take 1.5 tablets (30 mEq total) by mouth daily. For 5 days then stop   True Metrix Blood Glucose Test test strip Generic drug: glucose blood Use as instructed   True Metrix Meter w/Device Kit 1 each by Does not apply route 3 (three) times daily.               Durable Medical Equipment  (From admission, onward)           Start     Ordered   02/06/24 0717  For home use only DME Walker rolling  Once       Question Answer Comment  Walker: With 5 Inch Wheels   Patient needs a walker to treat with the following condition Physical deconditioning      02/06/24 0717            Follow-up Information     [facility] A Dept. of [place]. [facility] [employer]. Go on 02/27/2024.   Specialty: Radiology Why: Please arrive by 12:00 pm in order to have a PA/LAT CXR taken PRIOR to office appointment Contact information: [address] [place]  [phone]        Kerrin Elspeth BROCKS, MD. Go on 02/27/2024.   Specialty: Cardiothoracic Surgery Why: Appointment time is at 1:00 pm Contact information: [address] [place] [address] [phone]         Wyn Jackee VEAR Mickey., NP. Go on 02/20/2024.   Specialty: Cardiology Why: Appointment time is at 8:25 am Contact information: [address] [place] [address] [phone]                 Signed:  Kyla CHRISTELLA Dwan DEVONNA 02/06/2024, 7:24 AM

## 2024-02-02 NOTE — Discharge Instructions (Signed)

## 2024-02-02 NOTE — Anesthesia Postprocedure Evaluation (Signed)
 Anesthesia Post Note  Patient: Zachary [name]  Procedure(s) Performed: CORONARY ARTERY BYPASS GRAFTING X 4, USING LEFT INTERNAL MAMMARY ARTERY AND ENDOSCOPIC HARVESTED RIGHT GREATER SAPHENOUS VEIN (Chest) ECHOCARDIOGRAM, TRANSESOPHAGEAL, INTRAOPERATIVE     Patient location during evaluation: ICU Anesthesia Type: General Level of consciousness: sedated and patient remains intubated per anesthesia plan Pain management: pain level controlled Vital Signs Assessment: post-procedure vital signs reviewed and stable Respiratory status: patient remains intubated per anesthesia plan Cardiovascular status: stable Postop Assessment: no apparent nausea or vomiting Anesthetic complications: no   No notable events documented.  Last Vitals:  Vitals:   02/02/24 0718 02/02/24 1343  BP:  128/79  Pulse: 70 81  Resp: 18 16  Temp:    SpO2: 97% 100%    Last Pain:  Vitals:   02/02/24 0617  TempSrc:   PainSc: 0-No pain                 Juventino Oppenheim

## 2024-02-02 NOTE — Anesthesia Procedure Notes (Signed)
 Arterial Line Insertion Start/End6/20/2025 7:15 AM Performed by: Rochelle Chu, CRNA, CRNA  Patient location: Pre-op. Preanesthetic checklist: patient identified, IV checked, site marked, risks and benefits discussed, surgical consent, monitors and equipment checked, pre-op evaluation, timeout performed and anesthesia consent Lidocaine  1% used for infiltration Right, radial was placed Catheter size: 20 G Hand hygiene performed  and maximum sterile barriers used   Attempts: 1 Procedure performed without using ultrasound guided technique. Following insertion, dressing applied and Biopatch. Post procedure assessment: normal and unchanged  Patient tolerated the procedure well with no immediate complications.

## 2024-02-02 NOTE — Transfer of Care (Signed)
 Immediate Anesthesia Transfer of Care Note  Patient: Zachary [name]  Procedure(s) Performed: CORONARY ARTERY BYPASS GRAFTING X 4, USING LEFT INTERNAL MAMMARY ARTERY AND ENDOSCOPIC HARVESTED RIGHT GREATER SAPHENOUS VEIN (Chest) ECHOCARDIOGRAM, TRANSESOPHAGEAL, INTRAOPERATIVE  Patient Location: ICU  Anesthesia Type:General  Level of Consciousness: sedated and Patient remains intubated per anesthesia plan  Airway & Oxygen Therapy: Patient remains intubated per anesthesia plan and Patient placed on Ventilator (see vital sign flow sheet for setting)  Post-op Assessment: Report given to RN and Post -op Vital signs reviewed and stable  Post vital signs: Reviewed and stable  Last Vitals:  See ICU flowsheet   Last Pain:  Vitals:   02/02/24 0617  TempSrc:   PainSc: 0-No pain         Complications: No notable events documented. Patient transported to ICU with standard monitors (HR, BP, SPO2, RR) and emergency drugs/equipment. Controlled ventilation maintained via ambu bag. Report given to bedside RN and respiratory therapist. Pt connected to ICU monitor and ventilator. All questions answered and vital signs stable before leaving

## 2024-02-03 ENCOUNTER — Inpatient Hospital Stay (HOSPITAL_COMMUNITY)

## 2024-02-03 LAB — CBC
HCT: 31.6 % — ABNORMAL LOW (ref 39.0–52.0)
HCT: 34.2 % — ABNORMAL LOW (ref 39.0–52.0)
Hemoglobin: 10.4 g/dL — ABNORMAL LOW (ref 13.0–17.0)
Hemoglobin: 11.4 g/dL — ABNORMAL LOW (ref 13.0–17.0)
MCH: 27.4 pg (ref 26.0–34.0)
MCH: 27.8 pg (ref 26.0–34.0)
MCHC: 32.9 g/dL (ref 30.0–36.0)
MCHC: 33.3 g/dL (ref 30.0–36.0)
MCV: 83.4 fL (ref 80.0–100.0)
MCV: 83.4 fL (ref 80.0–100.0)
Platelets: 278 10*3/uL (ref 150–400)
Platelets: 304 10*3/uL (ref 150–400)
RBC: 3.79 MIL/uL — ABNORMAL LOW (ref 4.22–5.81)
RBC: 4.1 MIL/uL — ABNORMAL LOW (ref 4.22–5.81)
RDW: 12.8 % (ref 11.5–15.5)
RDW: 12.8 % (ref 11.5–15.5)
WBC: 13.4 10*3/uL — ABNORMAL HIGH (ref 4.0–10.5)
WBC: 15.1 10*3/uL — ABNORMAL HIGH (ref 4.0–10.5)
nRBC: 0 % (ref 0.0–0.2)
nRBC: 0 % (ref 0.0–0.2)

## 2024-02-03 LAB — BASIC METABOLIC PANEL WITH GFR
Anion gap: 9 (ref 5–15)
Anion gap: 9 (ref 5–15)
BUN: 9 mg/dL (ref 6–20)
BUN: 10 mg/dL (ref 6–20)
CO2: 19 mmol/L — ABNORMAL LOW (ref 22–32)
CO2: 22 mmol/L (ref 22–32)
Calcium: 8.4 mg/dL — ABNORMAL LOW (ref 8.9–10.3)
Calcium: 8.9 mg/dL (ref 8.9–10.3)
Chloride: 107 mmol/L (ref 98–111)
Chloride: 102 mmol/L (ref 98–111)
Creatinine, Ser: 0.93 mg/dL (ref 0.61–1.24)
Creatinine, Ser: 0.91 mg/dL (ref 0.61–1.24)
GFR, Estimated: >60 mL/min (ref >60)
GFR, Estimated: >60 mL/min (ref >60)
Glucose, Bld: 133 mg/dL — ABNORMAL HIGH (ref 70–99)
Glucose, Bld: 139 mg/dL — ABNORMAL HIGH (ref 70–99)
Potassium: 3.9 mmol/L (ref 3.5–5.1)
Potassium: 3.8 mmol/L (ref 3.5–5.1)
Sodium: 135 mmol/L (ref 135–145)
Sodium: 133 mmol/L — ABNORMAL LOW (ref 135–145)

## 2024-02-03 LAB — GLUCOSE, CAPILLARY
Glucose-Capillary: 142 mg/dL — ABNORMAL HIGH (ref 70–99)
Glucose-Capillary: 143 mg/dL — ABNORMAL HIGH (ref 70–99)
Glucose-Capillary: 137 mg/dL — ABNORMAL HIGH (ref 70–99)
Glucose-Capillary: 148 mg/dL — ABNORMAL HIGH (ref 70–99)
Glucose-Capillary: 161 mg/dL — ABNORMAL HIGH (ref 70–99)
Glucose-Capillary: 145 mg/dL — ABNORMAL HIGH (ref 70–99)
Glucose-Capillary: 154 mg/dL — ABNORMAL HIGH (ref 70–99)
Glucose-Capillary: 155 mg/dL — ABNORMAL HIGH (ref 70–99)
Glucose-Capillary: 137 mg/dL — ABNORMAL HIGH (ref 70–99)
Glucose-Capillary: 146 mg/dL — ABNORMAL HIGH (ref 70–99)
Glucose-Capillary: 116 mg/dL — ABNORMAL HIGH (ref 70–99)

## 2024-02-03 LAB — MAGNESIUM
Magnesium: 2 mg/dL (ref 1.7–2.4)
Magnesium: 2 mg/dL (ref 1.7–2.4)

## 2024-02-03 LAB — POCT I-STAT 7, (LYTES, BLD GAS, ICA,H+H)
Acid-base deficit: 3 mmol/L — ABNORMAL HIGH (ref 0.0–2.0)
Bicarbonate: 20.6 mmol/L (ref 20.0–28.0)
Calcium, Ion: 1.1 mmol/L — ABNORMAL LOW (ref 1.15–1.40)
HCT: 31 % — ABNORMAL LOW (ref 39.0–52.0)
Hemoglobin: 10.5 g/dL — ABNORMAL LOW (ref 13.0–17.0)
O2 Saturation: 99 %
Patient temperature: 35.2
Potassium: 3.6 mmol/L (ref 3.5–5.1)
Sodium: 139 mmol/L (ref 135–145)
TCO2: 21 mmol/L — ABNORMAL LOW (ref 22–32)
pCO2 arterial: 27.1 mmHg — ABNORMAL LOW (ref 32–48)
pH, Arterial: 7.483 — ABNORMAL HIGH (ref 7.35–7.45)
pO2, Arterial: 142 mmHg — ABNORMAL HIGH (ref 83–108)

## 2024-02-03 MED ORDER — CARVEDILOL 12.5 MG PO TABS
12.5000 mg | ORAL_TABLET | Freq: Two times a day (BID) | ORAL | Status: DC
  Administered 2024-02-03 – 2024-02-06 (×7): 12.5 mg via ORAL
  Filled 2024-02-03 (×7): qty 1

## 2024-02-03 MED ORDER — AMLODIPINE BESYLATE 10 MG PO TABS
10.0000 mg | ORAL_TABLET | Freq: Every day | ORAL | Status: DC
  Administered 2024-02-03 – 2024-02-06 (×4): 10 mg via ORAL
  Filled 2024-02-03 (×4): qty 1

## 2024-02-03 MED ORDER — FUROSEMIDE 10 MG/ML IJ SOLN
20.0000 mg | Freq: Two times a day (BID) | INTRAMUSCULAR | Status: DC
  Administered 2024-02-03 (×2): 20 mg via INTRAVENOUS
  Filled 2024-02-03 (×2): qty 2

## 2024-02-03 MED ORDER — INSULIN GLARGINE-YFGN 100 UNIT/ML ~~LOC~~ SOLN
15.0000 [IU] | Freq: Two times a day (BID) | SUBCUTANEOUS | Status: DC
  Administered 2024-02-03 – 2024-02-06 (×6): 15 [IU] via SUBCUTANEOUS
  Filled 2024-02-03 (×8): qty 0.15

## 2024-02-03 MED ORDER — ENOXAPARIN SODIUM 40 MG/0.4ML IJ SOSY
40.0000 mg | PREFILLED_SYRINGE | Freq: Every day | INTRAMUSCULAR | Status: DC
  Administered 2024-02-03 – 2024-02-05 (×3): 40 mg via SUBCUTANEOUS
  Filled 2024-02-03 (×3): qty 0.4

## 2024-02-03 MED ORDER — INSULIN ASPART 100 UNIT/ML IJ SOLN
0.0000 [IU] | INTRAMUSCULAR | Status: DC
  Administered 2024-02-03 (×3): 2 [IU] via SUBCUTANEOUS
  Administered 2024-02-04: 4 [IU] via SUBCUTANEOUS

## 2024-02-03 MED ORDER — POTASSIUM CHLORIDE 10 MEQ/50ML IV SOLN
10.0000 meq | INTRAVENOUS | Status: AC
  Administered 2024-02-03 (×3): 10 meq via INTRAVENOUS
  Filled 2024-02-03 (×3): qty 50

## 2024-02-03 NOTE — Progress Notes (Signed)
 1 Day Post-Op Procedure(s) (LRB): CORONARY ARTERY BYPASS GRAFTING X 4, USING LEFT INTERNAL MAMMARY ARTERY AND ENDOSCOPIC HARVESTED RIGHT GREATER SAPHENOUS VEIN (N/A) ECHOCARDIOGRAM, TRANSESOPHAGEAL, INTRAOPERATIVE (N/A) Subjective: Some incisional pain, no nausea  Objective: Vital signs in last 24 hours: Temp:  [95.2 F (35.1 C)-100.6 F (38.1 C)] 99.1 F (37.3 C) (06/21 0730) Pulse Rate:  [71-97] 78 (06/21 0730) Cardiac Rhythm: Normal sinus rhythm (06/21 0019) Resp:  [6-33] 29 (06/21 0730) BP: (102-130)/(66-94) 123/76 (06/21 0730) SpO2:  [92 %-100 %] 95 % (06/21 0730) Arterial Line BP: (89-322)/(51-314) 147/65 (06/21 0700) FiO2 (%):  [40 %-50 %] 40 % (06/20 2015) Weight:  [83.1 kg] 83.1 kg (06/21 0500)  Hemodynamic parameters for last 24 hours: PAP: (13-66)/(2-52) 28/15 CVP:  [1 mmHg-84 mmHg] 16 mmHg CO:  [3.2 L/min-6.7 L/min] 4.3 L/min CI:  [1.61 L/min/m2-3.4 L/min/m2] 2.1 L/min/m2  Intake/Output from previous day: 06/20 0701 - 06/21 0700 In: 5627.1 [I.V.:2677.2; IV Piggyback:2949.9] Out: 6301 [Urine:3245; Chest Tube:453] Intake/Output this shift: No intake/output data recorded.  General appearance: alert, cooperative, and no distress Neurologic: intact Heart: regular rate and rhythm Lungs: diminished breath sounds bibasilar Abdomen: mildly distended, nontender, + BS  Lab Results: Recent Labs    02/02/24 2023 02/02/24 2058 02/02/24 2216 02/03/24 0503  WBC 12.6*  --   --  13.4*  HGB 10.5*   < > 9.9* 10.4*  HCT 31.4*   < > 29.0* 31.6*  PLT 257  --   --  278   < > = values in this interval not displayed.   BMET:  Recent Labs    02/02/24 2023 02/02/24 2058 02/02/24 2216 02/03/24 0503  NA 137   < > 138 135  K 4.6   < > 4.2 3.9  CL 107  --   --  107  CO2 17*  --   --  19*  GLUCOSE 132*  --   --  133*  BUN 11  --   --  9  CREATININE 1.02  --   --  0.93  CALCIUM  8.8*  --   --  8.4*   < > = values in this interval not displayed.    PT/INR:  Recent Labs     02/02/24 1348  LABPROT 17.9*  INR 1.5*   ABG    Component Value Date/Time   PHART 7.344 (L) 02/02/2024 2216   HCO3 19.0 (L) 02/02/2024 2216   TCO2 20 (L) 02/02/2024 2216   ACIDBASEDEF 6.0 (H) 02/02/2024 2216   O2SAT 99 02/02/2024 2216   CBG (last 3)  Recent Labs    02/03/24 0114 02/03/24 0309 02/03/24 0502  GLUCAP 142* 137* 148*    Assessment/Plan: S/P Procedure(s) (LRB): CORONARY ARTERY BYPASS GRAFTING X 4, USING LEFT INTERNAL MAMMARY ARTERY AND ENDOSCOPIC HARVESTED RIGHT GREATER SAPHENOUS VEIN (N/A) ECHOCARDIOGRAM, TRANSESOPHAGEAL, INTRAOPERATIVE (N/A) POD # 1 Looks good NEURO- intact CV- in SR with good hemodynamics  Required nicardipine  drip for hypertension  ECG - RBBB resolved but has J point elevation   Will review with Cardiology   Nothing clinically to suggest ischemia  ASA, Coreg , Norvasc   Statin  Dc Swan and A line RESP- IS for atelectasis RENAL- creatinine normal lytes oK  Lasix  this AM ENDO- CBG controlled on insulin  drip   Transition to Semglee  + SSI GI - no nausea, mild gastric distention  Advance diet as tolerated SCD + enoxaparin  for DVT prophylaxis Dc chest tubes   LOS: 1 day    Zachary [name] 02/03/2024

## 2024-02-03 NOTE — Plan of Care (Signed)
  Problem: Clinical Measurements: Goal: Ability to maintain clinical measurements within normal limits will improve Outcome: Progressing Goal: Will remain free from infection Outcome: Progressing Goal: Diagnostic test results will improve Outcome: Progressing Goal: Respiratory complications will improve Outcome: Progressing Goal: Cardiovascular complication will be avoided Outcome: Progressing   Problem: Coping: Goal: Level of anxiety will decrease Outcome: Progressing   Problem: Elimination: Goal: Will not experience complications related to bowel motility Outcome: Progressing Goal: Will not experience complications related to urinary retention Outcome: Progressing   Problem: Pain Managment: Goal: General experience of comfort will improve and/or be controlled Outcome: Progressing

## 2024-02-03 NOTE — Progress Notes (Signed)
 12-lead EKG performed.Critical value noted.  Clementina Sport, RN, noted.

## 2024-02-03 NOTE — Progress Notes (Signed)
 Morning ECG showed ST changes, MD aware. He will discuss with cardiology. VSS and patient has no complaints or symptoms at this time. See flowsheet for full assessment.

## 2024-02-03 NOTE — Progress Notes (Signed)
   [address] [place] [address]             [phone]    POD # 1  Up in chair, ambulated well No complaints  BP 123/73   Pulse 92   Temp 99 F (37.2 C) (Oral)   Resp (!) 23   Ht 5' 10 (1.778 m)   Wt 83.1 kg   SpO2 95%   BMI 26.30 kg/m    Intake/Output Summary (Last 24 hours) at 02/03/2024 1801 Last data filed at 02/03/2024 1200 Gross per 24 hour  Intake 2473.16 ml  Output 3013 ml  Net -539.84 ml   Bedside echo with no significant wall motion abnormality  ECG with STR changes- suspect pericarditis  Elspeth C. Kerrin, MD [facility] [phone]

## 2024-02-03 NOTE — Progress Notes (Signed)
 Repeat ECG complete, results reviewed by Dr Zenaida. Per MD, okay to ambulate patient. Remains asymptomatic.

## 2024-02-04 ENCOUNTER — Inpatient Hospital Stay (HOSPITAL_COMMUNITY)

## 2024-02-04 LAB — CBC
HCT: 30.5 % — ABNORMAL LOW (ref 39.0–52.0)
Hemoglobin: 10.1 g/dL — ABNORMAL LOW (ref 13.0–17.0)
MCH: 27.6 pg (ref 26.0–34.0)
MCHC: 33.1 g/dL (ref 30.0–36.0)
MCV: 83.3 fL (ref 80.0–100.0)
Platelets: 260 10*3/uL (ref 150–400)
RBC: 3.66 MIL/uL — ABNORMAL LOW (ref 4.22–5.81)
RDW: 12.9 % (ref 11.5–15.5)
WBC: 13.3 10*3/uL — ABNORMAL HIGH (ref 4.0–10.5)
nRBC: 0 % (ref 0.0–0.2)

## 2024-02-04 LAB — BASIC METABOLIC PANEL WITH GFR
Anion gap: 8 (ref 5–15)
BUN: 12 mg/dL (ref 6–20)
CO2: 22 mmol/L (ref 22–32)
Calcium: 8.4 mg/dL — ABNORMAL LOW (ref 8.9–10.3)
Chloride: 104 mmol/L (ref 98–111)
Creatinine, Ser: 1.01 mg/dL (ref 0.61–1.24)
GFR, Estimated: >60 mL/min (ref >60)
Glucose, Bld: 155 mg/dL — ABNORMAL HIGH (ref 70–99)
Potassium: 3.4 mmol/L — ABNORMAL LOW (ref 3.5–5.1)
Sodium: 134 mmol/L — ABNORMAL LOW (ref 135–145)

## 2024-02-04 LAB — GLUCOSE, CAPILLARY
Glucose-Capillary: 112 mg/dL — ABNORMAL HIGH (ref 70–99)
Glucose-Capillary: 122 mg/dL — ABNORMAL HIGH (ref 70–99)
Glucose-Capillary: 140 mg/dL — ABNORMAL HIGH (ref 70–99)
Glucose-Capillary: 151 mg/dL — ABNORMAL HIGH (ref 70–99)
Glucose-Capillary: 177 mg/dL — ABNORMAL HIGH (ref 70–99)
Glucose-Capillary: 85 mg/dL (ref 70–99)

## 2024-02-04 MED ORDER — ALUM & MAG HYDROXIDE-SIMETH 200-200-20 MG/5ML PO SUSP: 15.0000 mL | ORAL | Status: DC | PRN

## 2024-02-04 MED ORDER — CLOPIDOGREL BISULFATE 75 MG PO TABS
75.0000 mg | ORAL_TABLET | Freq: Every day | ORAL | Status: DC
  Administered 2024-02-05 – 2024-02-06 (×2): 75 mg via ORAL
  Filled 2024-02-04 (×2): qty 1

## 2024-02-04 MED ORDER — FUROSEMIDE 40 MG PO TABS
40.0000 mg | ORAL_TABLET | Freq: Every day | ORAL | Status: DC
  Administered 2024-02-04 – 2024-02-06 (×3): 40 mg via ORAL
  Filled 2024-02-04 (×3): qty 1

## 2024-02-04 MED ORDER — POTASSIUM CHLORIDE CRYS ER 20 MEQ PO TBCR
40.0000 meq | EXTENDED_RELEASE_TABLET | ORAL | Status: AC
  Administered 2024-02-04 (×2): 40 meq via ORAL
  Filled 2024-02-04 (×2): qty 2

## 2024-02-04 MED ORDER — POTASSIUM CHLORIDE CRYS ER 20 MEQ PO TBCR
20.0000 meq | EXTENDED_RELEASE_TABLET | ORAL | Status: DC
  Administered 2024-02-04: 20 meq via ORAL
  Filled 2024-02-04: qty 1

## 2024-02-04 MED ORDER — INSULIN ASPART 100 UNIT/ML IJ SOLN: 0.0000 [IU] | Freq: Three times a day (TID) | INTRAMUSCULAR | Status: DC

## 2024-02-04 MED ORDER — INSULIN ASPART 100 UNIT/ML IJ SOLN: 0.0000 [IU] | Freq: Every day | INTRAMUSCULAR | Status: DC

## 2024-02-04 MED ORDER — ASPIRIN 81 MG PO TBEC
81.0000 mg | DELAYED_RELEASE_TABLET | Freq: Every day | ORAL | Status: DC
  Administered 2024-02-04 – 2024-02-06 (×3): 81 mg via ORAL
  Filled 2024-02-04 (×3): qty 1

## 2024-02-04 MED ORDER — SODIUM CHLORIDE 0.9% FLUSH
3.0000 mL | Freq: Two times a day (BID) | INTRAVENOUS | Status: DC
  Administered 2024-02-04 – 2024-02-06 (×5): 3 mL via INTRAVENOUS

## 2024-02-04 MED ORDER — SODIUM CHLORIDE 0.9 % IV SOLN
250.0000 mL | INTRAVENOUS | Status: AC | PRN
Start: 2024-02-04 — End: 2024-02-05

## 2024-02-04 MED ORDER — SODIUM CHLORIDE 0.9% FLUSH: 3.0000 mL | INTRAVENOUS | Status: DC | PRN

## 2024-02-04 MED ORDER — MAGNESIUM HYDROXIDE 400 MG/5ML PO SUSP
30.0000 mL | Freq: Every day | ORAL | Status: DC | PRN
Start: 2024-02-04 — End: 2024-02-06

## 2024-02-04 MED ORDER — METFORMIN HCL 500 MG PO TABS
500.0000 mg | ORAL_TABLET | Freq: Two times a day (BID) | ORAL | Status: DC
  Administered 2024-02-04 – 2024-02-06 (×5): 500 mg via ORAL
  Filled 2024-02-04 (×5): qty 1

## 2024-02-04 MED ORDER — ~~LOC~~ CARDIAC SURGERY, PATIENT & FAMILY EDUCATION: Freq: Once | Status: AC

## 2024-02-04 MED ORDER — INSULIN ASPART 100 UNIT/ML IJ SOLN
0.0000 [IU] | Freq: Three times a day (TID) | INTRAMUSCULAR | Status: DC
  Administered 2024-02-04 – 2024-02-05 (×2): 2 [IU] via SUBCUTANEOUS

## 2024-02-04 NOTE — Progress Notes (Signed)
 2 Days Post-Op Procedure(s) (LRB): CORONARY ARTERY BYPASS GRAFTING X 4, USING LEFT INTERNAL MAMMARY ARTERY AND ENDOSCOPIC HARVESTED RIGHT GREATER SAPHENOUS VEIN (N/A) ECHOCARDIOGRAM, TRANSESOPHAGEAL, INTRAOPERATIVE (N/A) Subjective: No complaints this AM, ambulated already  Objective: Vital signs in last 24 hours: Temp:  [98.6 F (37 C)-100 F (37.8 C)] 99.3 F (37.4 C) (06/22 0600) Pulse Rate:  [77-97] 90 (06/22 0701) Cardiac Rhythm: Bundle branch block (06/21 2000) Resp:  [15-36] 27 (06/22 0701) BP: (89-141)/(64-125) 123/89 (06/22 0701) SpO2:  [91 %-98 %] 94 % (06/22 0701) Arterial Line BP: (92-147)/(63-97) 97/75 (06/21 1045) Weight:  [83.3 kg] 83.3 kg (06/22 0500)  Hemodynamic parameters for last 24 hours: PAP: (25-28)/(12-15) 25/13 CVP:  [11 mmHg-16 mmHg] 11 mmHg CO:  [4.3 L/min] 4.3 L/min CI:  [2.15 L/min/m2] 2.15 L/min/m2  Intake/Output from previous day: 06/21 0701 - 06/22 0700 In: 1240.1 [P.O.:360; I.V.:543.2; IV Piggyback:336.9] Out: 3695 [Urine:3685; Chest Tube:10] Intake/Output this shift: No intake/output data recorded.  General appearance: alert, cooperative, and no distress Neurologic: intact Heart: regular rate and rhythm and faint rub Lungs: diminished breath sounds bibasilar Abdomen: normal findings: soft, non-tender  Lab Results: Recent Labs    02/03/24 1737 02/04/24 0359  WBC 15.1* 13.3*  HGB 11.4* 10.1*  HCT 34.2* 30.5*  PLT 304 260   BMET:  Recent Labs    02/03/24 1737 02/04/24 0359  NA 133* 134*  K 3.8 3.4*  CL 102 104  CO2 22 22  GLUCOSE 139* 155*  BUN 10 12  CREATININE 0.91 1.01  CALCIUM  8.9 8.4*    PT/INR:  Recent Labs    02/02/24 1348  LABPROT 17.9*  INR 1.5*   ABG    Component Value Date/Time   PHART 7.344 (L) 02/02/2024 2216   HCO3 19.0 (L) 02/02/2024 2216   TCO2 20 (L) 02/02/2024 2216   ACIDBASEDEF 6.0 (H) 02/02/2024 2216   O2SAT 99 02/02/2024 2216   CBG (last 3)  Recent Labs    02/03/24 2253 02/04/24 0012  02/04/24 0302  GLUCAP 116* 151* 177*    Assessment/Plan: S/P Procedure(s) (LRB): CORONARY ARTERY BYPASS GRAFTING X 4, USING LEFT INTERNAL MAMMARY ARTERY AND ENDOSCOPIC HARVESTED RIGHT GREATER SAPHENOUS VEIN (N/A) ECHOCARDIOGRAM, TRANSESOPHAGEAL, INTRAOPERATIVE (N/A) Plan for transfer to step-down: see transfer orders Looks great NEURO- intact CV- in SR with normal BP  Dc pacing wires  ASA, add Plavix  tomorrow  Statin  Coreg  + norvasc  RESP- CXR shows small right effusion  IS RENAL- creatinine normal  Hypokalemia- supplement  PO Lasix  ENDO- CBG mildly elevated  Resume metformin   Continue Semglee   Change SSI to [facility] and [facility] GI- tolerating diet SCD + enoxaparin  Continue cardiac rehab  LOS: 2 days    Zachary [name] 02/04/2024

## 2024-02-04 NOTE — Plan of Care (Signed)
  Problem: Education: Goal: Knowledge of General Education information will improve Description: Including pain rating scale, medication(s)/side effects and non-pharmacologic comfort measures Outcome: Progressing   Problem: Health Behavior/Discharge Planning: Goal: Ability to manage health-related needs will improve Outcome: Progressing   Problem: Clinical Measurements: Goal: Ability to maintain clinical measurements within normal limits will improve Outcome: Progressing Goal: Will remain free from infection Outcome: Progressing Goal: Diagnostic test results will improve Outcome: Progressing Goal: Respiratory complications will improve Outcome: Progressing Goal: Cardiovascular complication will be avoided Outcome: Progressing   Problem: Activity: Goal: Risk for activity intolerance will decrease Outcome: Progressing   Problem: Nutrition: Goal: Adequate nutrition will be maintained Outcome: Progressing   Problem: Coping: Goal: Level of anxiety will decrease Outcome: Progressing   Problem: Elimination: Goal: Will not experience complications related to bowel motility Outcome: Progressing Goal: Will not experience complications related to urinary retention Outcome: Progressing   Problem: Pain Managment: Goal: General experience of comfort will improve and/or be controlled Outcome: Progressing   Problem: Safety: Goal: Ability to remain free from injury will improve Outcome: Progressing   Problem: Skin Integrity: Goal: Risk for impaired skin integrity will decrease Outcome: Progressing   Problem: Education: Goal: Will demonstrate proper wound care and an understanding of methods to prevent future damage Outcome: Progressing Goal: Knowledge of disease or condition will improve Outcome: Progressing Goal: Knowledge of the prescribed therapeutic regimen will improve Outcome: Progressing Goal: Individualized Educational Video(s) Outcome: Progressing   Problem:  Activity: Goal: Risk for activity intolerance will decrease Outcome: Progressing   Problem: Cardiac: Goal: Will achieve and/or maintain hemodynamic stability Outcome: Progressing   Problem: Clinical Measurements: Goal: Postoperative complications will be avoided or minimized Outcome: Progressing   Problem: Respiratory: Goal: Respiratory status will improve Outcome: Progressing   Problem: Skin Integrity: Goal: Wound healing without signs and symptoms of infection Outcome: Progressing Goal: Risk for impaired skin integrity will decrease Outcome: Progressing   Problem: Urinary Elimination: Goal: Ability to achieve and maintain adequate renal perfusion and functioning will improve Outcome: Progressing   Problem: Education: Goal: Ability to describe self-care measures that may prevent or decrease complications (Diabetes Survival Skills Education) will improve Outcome: Progressing Goal: Individualized Educational Video(s) Outcome: Progressing   Problem: Coping: Goal: Ability to adjust to condition or change in health will improve Outcome: Progressing   Problem: Fluid Volume: Goal: Ability to maintain a balanced intake and output will improve Outcome: Progressing   Problem: Health Behavior/Discharge Planning: Goal: Ability to identify and utilize available resources and services will improve Outcome: Progressing Goal: Ability to manage health-related needs will improve Outcome: Progressing   Problem: Metabolic: Goal: Ability to maintain appropriate glucose levels will improve Outcome: Progressing   Problem: Nutritional: Goal: Maintenance of adequate nutrition will improve Outcome: Progressing Goal: Progress toward achieving an optimal weight will improve Outcome: Progressing   Problem: Skin Integrity: Goal: Risk for impaired skin integrity will decrease Outcome: Progressing   Problem: Tissue Perfusion: Goal: Adequacy of tissue perfusion will improve Outcome:  Progressing

## 2024-02-04 NOTE — Progress Notes (Signed)
 Pt received from 2HR, patient is alert and oriented, V/S obtained, CCMD notified, CHG bath given, no any complains, all the needs met, call bell in reach.    02/04/24 1443  Vitals  Temp 98.3 F (36.8 C)  Temp Source Oral  BP (!) 135/93  MAP (mmHg) 106  BP Location Right Arm  BP Method Automatic  Patient Position (if appropriate) Lying  Pulse Rate 92  Pulse Rate Source Monitor  ECG Heart Rate 92  Resp 20  Level of Consciousness  Level of Consciousness Alert  Oxygen Therapy  SpO2 95 %  O2 Device Room Air

## 2024-02-04 NOTE — Plan of Care (Signed)

## 2024-02-05 ENCOUNTER — Encounter (HOSPITAL_COMMUNITY): Payer: Self-pay | Admitting: Thoracic Surgery (Cardiothoracic Vascular Surgery)

## 2024-02-05 ENCOUNTER — Inpatient Hospital Stay (HOSPITAL_COMMUNITY)

## 2024-02-05 LAB — CBC
HCT: 33.2 % — ABNORMAL LOW (ref 39.0–52.0)
Hemoglobin: 11 g/dL — ABNORMAL LOW (ref 13.0–17.0)
MCH: 27.2 pg (ref 26.0–34.0)
MCHC: 33.1 g/dL (ref 30.0–36.0)
MCV: 82.2 fL (ref 80.0–100.0)
Platelets: 278 10*3/uL (ref 150–400)
RBC: 4.04 MIL/uL — ABNORMAL LOW (ref 4.22–5.81)
RDW: 12.9 % (ref 11.5–15.5)
WBC: 12.6 10*3/uL — ABNORMAL HIGH (ref 4.0–10.5)
nRBC: 0 % (ref 0.0–0.2)

## 2024-02-05 LAB — GLUCOSE, CAPILLARY
Glucose-Capillary: 110 mg/dL — ABNORMAL HIGH (ref 70–99)
Glucose-Capillary: 139 mg/dL — ABNORMAL HIGH (ref 70–99)
Glucose-Capillary: 105 mg/dL — ABNORMAL HIGH (ref 70–99)
Glucose-Capillary: 124 mg/dL — ABNORMAL HIGH (ref 70–99)

## 2024-02-05 LAB — BASIC METABOLIC PANEL WITH GFR
Anion gap: 11 (ref 5–15)
BUN: 12 mg/dL (ref 6–20)
CO2: 23 mmol/L (ref 22–32)
Calcium: 8.5 mg/dL — ABNORMAL LOW (ref 8.9–10.3)
Chloride: 104 mmol/L (ref 98–111)
Creatinine, Ser: 0.97 mg/dL (ref 0.61–1.24)
GFR, Estimated: >60 mL/min (ref >60)
Glucose, Bld: 109 mg/dL — ABNORMAL HIGH (ref 70–99)
Potassium: 3.7 mmol/L (ref 3.5–5.1)
Sodium: 138 mmol/L (ref 135–145)

## 2024-02-05 MED ORDER — LACTULOSE 10 GM/15ML PO SOLN
20.0000 g | Freq: Once | ORAL | Status: AC
  Administered 2024-02-05: 20 g via ORAL
  Filled 2024-02-05: qty 30

## 2024-02-05 MED ORDER — POTASSIUM CHLORIDE CRYS ER 20 MEQ PO TBCR
30.0000 meq | EXTENDED_RELEASE_TABLET | Freq: Two times a day (BID) | ORAL | Status: AC
  Administered 2024-02-05 (×2): 30 meq via ORAL
  Filled 2024-02-05 (×2): qty 1

## 2024-02-05 MED ORDER — POTASSIUM CHLORIDE CRYS ER 20 MEQ PO TBCR: 20.0000 meq | EXTENDED_RELEASE_TABLET | Freq: Every day | ORAL | Status: DC

## 2024-02-05 NOTE — Progress Notes (Signed)
 CARDIAC REHAB PHASE I   Pt has just returned from walk in hallway. Reports tolerating well with no pain, SOB or dizziness. Encouraged continued mobility and IS use today. Post OHS education including site care, restrictions, heart healthy diabetic diet, sternal precautions, IS use at home, home needs at discharge, exercise guidelines and CRP2 reviewed. All questions and concerns addressed. Will refer to [facility] for CRP2. Will continue to follow.   1030-1050 Vaughn Asberry Hacking, RN BSN 02/05/2024 10:46 AM

## 2024-02-05 NOTE — Progress Notes (Signed)
 Mobility Specialist Progress Note:    02/05/24 1036  Mobility  Activity Ambulated with assistance in hallway  Level of Assistance Minimal assist, patient does 75% or more  Assistive Device None  Distance Ambulated (ft) 300 ft  Range of Motion/Exercises Active  RUE Weight Bearing Per Provider Order WBAT  LUE Weight Bearing Per Provider Order WBAT  Activity Response Tolerated well  Mobility Referral Yes  Mobility visit 1 Mobility  Mobility Specialist Start Time (ACUTE ONLY) 1018  Mobility Specialist Stop Time (ACUTE ONLY) 1028  Mobility Specialist Time Calculation (min) (ACUTE ONLY) 10 min   Pt received in bed, agreeable to mobility session. MinA required to stand. Able to follow and recall sternal precautions. Tolerated well, Max HR 110 bpm. Returned pt to room, sitting up in chair. Post Mobility BP 148/93 (104).  Pavlos Yon Mobility Specialist Please contact via Special educational needs teacher or  Rehab office at [phone]

## 2024-02-05 NOTE — Progress Notes (Addendum)
                  [address]           [place], [place] [address]                     [phone]        3 Days Post-Op Procedure(s) (LRB): CORONARY ARTERY BYPASS GRAFTING X 4, USING LEFT INTERNAL MAMMARY ARTERY AND ENDOSCOPIC HARVESTED RIGHT GREATER SAPHENOUS VEIN (N/A) ECHOCARDIOGRAM, TRANSESOPHAGEAL, INTRAOPERATIVE (N/A)  Subjective: Patient just waking up this am. He has not had a bowel movement. He has no other complaint.  Objective: Vital signs in last 24 hours: Temp:  [98.1 F (36.7 C)-99.7 F (37.6 C)] 99.7 F (37.6 C) (06/23 0405) Pulse Rate:  [83-99] 93 (06/23 0405) Cardiac Rhythm: Normal sinus rhythm (06/22 1900) Resp:  [20-27] 20 (06/23 0405) BP: (116-148)/(77-100) 122/79 (06/23 0405) SpO2:  [93 %-95 %] 93 % (06/23 0405)  Pre op weight 81.1 kg Current Weight  02/04/24 83.3 kg      Intake/Output from previous day: 06/22 0701 - 06/23 0700 In: 960 [P.O.:960] Out: 2600 [Urine:2600]   Physical Exam:  Cardiovascular: RRR Pulmonary: Slightly diminished bibasilar breath sounds Abdomen: Soft, non tender, bowel sounds present. Extremities: Trace lower extremity edema.  Wounds: Clean and dry.  No erythema or signs of infection.  Lab Results: CBC: Recent Labs    02/04/24 0359 02/05/24 0400  WBC 13.3* 12.6*  HGB 10.1* 11.0*  HCT 30.5* 33.2*  PLT 260 278   BMET:  Recent Labs    02/04/24 0359 02/05/24 0400  NA 134* 138  K 3.4* 3.7  CL 104 104  CO2 22 23  GLUCOSE 155* 109*  BUN 12 12  CREATININE 1.01 0.97  CALCIUM  8.4* 8.5*    PT/INR:  Lab Results  Component Value Date   INR 1.5 (H) 02/02/2024   INR 1.1 02/01/2024   INR 0.96 03/18/2017   ABG:  INR: Will add last result for INR, ABG once components are confirmed Will add last 4 CBG results once components are confirmed  Assessment/Plan:  1. CV - SR. On Amlodipine  10 mg daily, Carvedilol  12.5 mg bid, and Plavix  75 mg daily. 2.  Pulmonary - On room air. PA/LAT CXR  appears to show bibasilar atelectasis/pleural effusions, tiny left apical pneumothorax appears resolved. Encourage incentive spirometer 3. Above pre op weight, requires further diuresis-continue oral Lasix  4.  Expected post op acute blood loss anemia - H and H this am stable at 11 and 33.2 5. DM-CBGs 112/85/110. On Metformin  500 mg bid and Insulin . Pre op HGA1C 6.6 6. Supplement potassium 7. LOC constipation 8. On Lovenox  for DVT prophylaxis 9. Disposition-possible discharge in am  Kyla HERO ZimmermanPA-C 6:58 AM Patient seen and examined, agree with above  Elspeth C. Kerrin, MD [facility] [phone]

## 2024-02-06 LAB — BASIC METABOLIC PANEL WITH GFR
Anion gap: 13 (ref 5–15)
BUN: 14 mg/dL (ref 6–20)
CO2: 25 mmol/L (ref 22–32)
Calcium: 8.9 mg/dL (ref 8.9–10.3)
Chloride: 100 mmol/L (ref 98–111)
Creatinine, Ser: 1.01 mg/dL (ref 0.61–1.24)
GFR, Estimated: >60 mL/min (ref >60)
Glucose, Bld: 120 mg/dL — ABNORMAL HIGH (ref 70–99)
Potassium: 3.8 mmol/L (ref 3.5–5.1)
Sodium: 138 mmol/L (ref 135–145)

## 2024-02-06 LAB — CBC
HCT: 34.5 % — ABNORMAL LOW (ref 39.0–52.0)
Hemoglobin: 11.6 g/dL — ABNORMAL LOW (ref 13.0–17.0)
MCH: 27.7 pg (ref 26.0–34.0)
MCHC: 33.6 g/dL (ref 30.0–36.0)
MCV: 82.3 fL (ref 80.0–100.0)
Platelets: 334 10*3/uL (ref 150–400)
RBC: 4.19 MIL/uL — ABNORMAL LOW (ref 4.22–5.81)
RDW: 12.9 % (ref 11.5–15.5)
WBC: 11.4 10*3/uL — ABNORMAL HIGH (ref 4.0–10.5)
nRBC: 0 % (ref 0.0–0.2)

## 2024-02-06 LAB — GLUCOSE, CAPILLARY: Glucose-Capillary: 101 mg/dL — ABNORMAL HIGH (ref 70–99)

## 2024-02-06 MED ORDER — POTASSIUM CHLORIDE CRYS ER 20 MEQ PO TBCR
30.0000 meq | EXTENDED_RELEASE_TABLET | Freq: Every day | ORAL | 0 refills | Status: DC
Start: 2024-02-06 — End: 2024-02-22

## 2024-02-06 MED ORDER — ACETAMINOPHEN 325 MG PO TABS: 650.0000 mg | ORAL_TABLET | Freq: Four times a day (QID) | ORAL | Status: DC | PRN

## 2024-02-06 MED ORDER — METFORMIN HCL 500 MG PO TABS
500.0000 mg | ORAL_TABLET | Freq: Two times a day (BID) | ORAL | 1 refills | Status: DC
Start: 2024-02-06 — End: 2024-04-29

## 2024-02-06 MED ORDER — POTASSIUM CHLORIDE CRYS ER 20 MEQ PO TBCR
30.0000 meq | EXTENDED_RELEASE_TABLET | Freq: Every day | ORAL | Status: DC
  Administered 2024-02-06: 30 meq via ORAL
  Filled 2024-02-06: qty 1

## 2024-02-06 MED ORDER — METFORMIN HCL 500 MG PO TABS: 500.0000 mg | ORAL_TABLET | Freq: Two times a day (BID) | ORAL | 1 refills | Status: DC

## 2024-02-06 MED ORDER — OXYCODONE HCL 5 MG PO TABS: 5.0000 mg | ORAL_TABLET | Freq: Four times a day (QID) | ORAL | 0 refills | Status: DC | PRN

## 2024-02-06 MED ORDER — FUROSEMIDE 40 MG PO TABS: 40.0000 mg | ORAL_TABLET | Freq: Every day | ORAL | 0 refills | Status: DC

## 2024-02-06 NOTE — Progress Notes (Signed)
 Mobility Specialist Progress Note:    02/06/24 0945  Mobility  Activity Ambulated with assistance in hallway  Level of Assistance Minimal assist, patient does 75% or more  Assistive Device None  Distance Ambulated (ft) 300 ft  RUE Weight Bearing Per Provider Order WBAT  LUE Weight Bearing Per Provider Order WBAT  Activity Response Tolerated well  Mobility Referral Yes  Mobility visit 1 Mobility  Mobility Specialist Start Time (ACUTE ONLY) [id number]  Mobility Specialist Stop Time (ACUTE ONLY) 0932  Mobility Specialist Time Calculation (min) (ACUTE ONLY) 9 min   Pt received in bed agreeable to mobility. MinA required STS. No c/o throughout ambulation. Returned to room w/o fault. Pt in bed. Personal belongings and call light within reach. All needs met.   Lavanda Pollack Mobility Specialist  Please contact via [employer] or  Rehab Office [phone]

## 2024-02-06 NOTE — Progress Notes (Addendum)
                  [address]           [place], [place] [address]                     [phone]        4 Days Post-Op Procedure(s) (LRB): CORONARY ARTERY BYPASS GRAFTING X 4, USING LEFT INTERNAL MAMMARY ARTERY AND ENDOSCOPIC HARVESTED RIGHT GREATER SAPHENOUS VEIN (N/A) ECHOCARDIOGRAM, TRANSESOPHAGEAL, INTRAOPERATIVE (N/A)  Subjective: Patient had a bowel movement yesterday. He has no complaint this am and would like to go home.  Objective: Vital signs in last 24 hours: Temp:  [98.4 F (36.9 C)-99.1 F (37.3 C)] 98.9 F (37.2 C) (06/24 0352) Pulse Rate:  [88-99] 92 (06/24 0352) Cardiac Rhythm: Normal sinus rhythm (06/23 2100) Resp:  [15-30] 19 (06/24 0352) BP: (108-135)/(77-93) 114/81 (06/24 0352) SpO2:  [95 %-97 %] 97 % (06/24 0352) Weight:  [79.6 kg] 79.6 kg (06/24 0500)  Pre op weight 81.1 kg Current Weight  02/06/24 79.6 kg      Intake/Output from previous day: 06/23 0701 - 06/24 0700 In: -  Out: 650 [Urine:650]   Physical Exam:  Cardiovascular: RRR Pulmonary: Slightly diminished bibasilar breath sounds Abdomen: Soft, non tender, bowel sounds present. Extremities: Trace lower extremity edema.  Wounds: Sternal, RLE, and LUE wounds are all clean and dry.  No erythema or signs of infection.  Lab Results: CBC: Recent Labs    02/05/24 0400 02/06/24 0316  WBC 12.6* 11.4*  HGB 11.0* 11.6*  HCT 33.2* 34.5*  PLT 278 334   BMET:  Recent Labs    02/05/24 0400 02/06/24 0316  NA 138 138  K 3.7 3.8  CL 104 100  CO2 23 25  GLUCOSE 109* 120*  BUN 12 14  CREATININE 0.97 1.01  CALCIUM  8.5* 8.9    PT/INR:  Lab Results  Component Value Date   INR 1.5 (H) 02/02/2024   INR 1.1 02/01/2024   INR 0.96 03/18/2017   ABG:  INR: Will add last result for INR, ABG once components are confirmed Will add last 4 CBG results once components are confirmed  Assessment/Plan:  1. CV - SR. On Amlodipine  10 mg daily, Carvedilol  12.5 mg bid, and  Plavix  75 mg daily. 2.  Pulmonary - On room air. Encourage incentive spirometer 3. Requires further diuresis-continue oral Lasix  4.  Expected post op acute blood loss anemia - H and H this am stable at 11.6 and 34.5 5. DM-CBGs 105/124/101. On Metformin  500 mg bid and Insulin . Pre op HGA1C 6.6 6. Supplement potassium 8. On Lovenox  for DVT prophylaxis 9. Disposition-discharge. At patient request, will order rolling walker  Donielle M ZimmermanPA-C 7:06 AM  Patient seen and examined, agree with above Dc home today  Elspeth C. Kerrin, MD [facility] [phone]

## 2024-02-06 NOTE — Progress Notes (Signed)
 Explained discharge instructions to patient. Reviewed follow up appointment and next medication administration times. Also reviewed education. Patient verbalized having an understanding for instructions given. All belongings are in the patient's possession to include his roller walker. IV and telemetry were removed. CCMD was notified. No other needs verbalized. Will transport downstairs to the discharge lounge to await his ride.

## 2024-02-06 NOTE — TOC Transition Note (Signed)
 Transition of Care (TOC) - Discharge Note Rayfield Gobble RN, BSN Transitions of Care Unit 4E- RN Case Manager See Treatment Team for direct phone #   Patient Details  Name: Zachary [name] MRN: [id number] Date of Birth: 10/24/1970  Transition of Care [facility]) CM/SW Contact:  Gobble Rayfield Hurst, RN Phone Number: 02/06/2024, 11:29 AM   Clinical Narrative:    Pt stable for transition home today. Order placed for DME- RW. Pt without preference for provider.   Call made to [employer] liaison for DME need- RW to be delivered to pt prior to discharge.   No HH needs noted, pt has ride coming later this am to transport home.  No further TOC needs noted   Final next level of care: Home/Self Care Barriers to Discharge: Barriers Resolved   Patient Goals and CMS Choice Patient states their goals for this hospitalization and ongoing recovery are:: return home   Choice offered to / list presented to : Patient      Discharge Placement               Home        Discharge Plan and Services Additional resources added to the After Visit Summary for     Discharge Planning Services: CM Consult Post Acute Care Choice: Durable Medical Equipment          DME Arranged: Vannie rolling DME Agency: [employer] Date DME Agency Contacted: 02/06/24 Time DME Agency Contacted: [phone] Representative spoke with at DME Agency: Darlyn HH Arranged: NA HH Agency: NA        Social Drivers of Health (SDOH) Interventions SDOH Screenings   Food Insecurity: Patient Declined (02/04/2024)  Housing: Patient Declined (02/04/2024)  Transportation Needs: Patient Declined (02/04/2024)  Utilities: Patient Declined (02/04/2024)  Alcohol Screen: Low Risk  (08/02/2023)  Depression (PHQ2-9): Low Risk  (08/03/2023)  Financial Resource Strain: Patient Declined (08/02/2023)  Physical Activity: Unknown (08/02/2023)  Social Connections: Unknown (08/02/2023)  Stress: No Stress Concern Present (08/02/2023)  Tobacco Use:  Low Risk  (02/02/2024)     Readmission Risk Interventions    02/06/2024   11:29 AM  Readmission Risk Prevention Plan  Post Dischage Appt Complete  Medication Screening Complete  Transportation Screening Complete

## 2024-02-06 NOTE — Progress Notes (Signed)
 CARDIAC REHAB PHASE I    Postop OHS education completed. Referral for CRP2 sent to [facility]. Plan for discharge home today.  Vaughn Asberry Hacking, RN BSN 02/06/2024 7:39 AM

## 2024-02-07 ENCOUNTER — Other Ambulatory Visit: Payer: Self-pay | Admitting: Nurse Practitioner

## 2024-02-07 ENCOUNTER — Telehealth: Payer: Self-pay | Admitting: *Deleted

## 2024-02-07 ENCOUNTER — Other Ambulatory Visit: Payer: Self-pay

## 2024-02-07 DIAGNOSIS — E119 Type 2 diabetes mellitus without complications: Secondary | ICD-10-CM

## 2024-02-07 MED ORDER — TRUE METRIX BLOOD GLUCOSE TEST VI STRP
ORAL_STRIP | 12 refills | Status: AC
  Filled 2024-02-07: qty 100, 50d supply, fill #0

## 2024-02-07 MED ORDER — ACCU-CHEK GUIDE ME W/DEVICE KIT
1.0000 | PACK | Freq: Two times a day (BID) | 0 refills | Status: AC
  Filled 2024-02-07: qty 1, 30d supply, fill #0

## 2024-02-07 MED ORDER — ACCU-CHEK SOFTCLIX LANCETS MISC
1.0000 | Freq: Two times a day (BID) | 3 refills | Status: AC
  Filled 2024-02-07: qty 100, 50d supply, fill #0

## 2024-02-07 NOTE — Transitions of Care (Post Inpatient/ED Visit) (Signed)
 02/07/2024  Name: Zachary [name] MRN: [id number] DOB: 09/01/1970  Today's TOC FU Call Status: Today's TOC FU Call Status:: Successful TOC FU Call Completed TOC FU Call Complete Date: 02/07/24 Patient's Name and Date of Birth confirmed.  Transition Care Management Follow-up Telephone Call Date of Discharge: 02/06/24 Discharge Facility: [facility]) Type of Discharge: Inpatient Admission Primary Inpatient Discharge Diagnosis:: S/P CABG x 4 How have you been since you were released from the hospital?: Better Any questions or concerns?: No  Items Reviewed: Did you receive and understand the discharge instructions provided?: Yes Medications obtained,verified, and reconciled?: Yes (Medications Reviewed) Any new allergies since your discharge?: No Dietary orders reviewed?: Yes Type of Diet Ordered:: heart healthy, low sodium, carb modified Do you have support at home?: Yes People in Home [RPT]: child(ren), adult Name of Support/Comfort Primary Source: adult children and ex wife are supportive  Medications Reviewed Today: Medications Reviewed Today     Reviewed by Lucky Andrea LABOR, RN (Registered Nurse) on 02/07/24 at 1036  Med List Status: <None>   Medication Order Taking? Sig Documenting Provider Last Dose Status Informant  amLODipine  (NORVASC ) 10 MG tablet [id number] Yes Take 1 tablet (10 mg total) by mouth daily. Danton Jon HERO, [place]  Active Self  aspirin  EC 81 MG tablet [id number]  Take 1 tablet (81 mg total) by mouth daily. Swallow whole.  Patient not taking: Reported on 02/07/2024   Lelon Hamilton T, PA-C  Active Self  atorvastatin  (LIPITOR ) 80 MG tablet [id number] Yes Take 1 tablet (80 mg total) by mouth daily. Lelon Hamilton T, PA-C  Active Self  BD ULTRA-FINE LANCETS lancets [id number]  Use as instructed  Patient not taking: Reported on 02/07/2024   Marcus Annabella RAMAN, PA-C  Active Self  blood glucose meter kit and supplies KIT [id number]  Dispense based on patient and insurance  preference. Use up to four times daily as directed. (FOR ICD-9 250.00, 250.01).  Patient not taking: Reported on 02/07/2024   Babs Arthea DASEN, MD  Active Self  Blood Glucose Monitoring Suppl (TRUE METRIX METER) w/Device KIT [id number]  1 each by Does not apply route 3 (three) times daily.  Patient not taking: Reported on 02/07/2024   Marcus Annabella RAMAN, PA-C  Active Self  carvedilol  (COREG ) 12.5 MG tablet [id number] Yes Take 1 tablet (12.5 mg total) by mouth 2 (two) times daily. Lelon Hamilton T, PA-C  Active Self  clopidogrel  (PLAVIX ) 75 MG tablet [id number] Yes Take 1 tablet (75 mg total) by mouth daily. Danton Jon HERO, PA-C  Active Self  furosemide  (LASIX ) 40 MG tablet [id number] Yes Take 1 tablet (40 mg total) by mouth daily. For 5 days then stop Zimmerman, Donielle M, PA-C  Active   glucose blood (TRUE METRIX BLOOD GLUCOSE TEST) test strip [id number]  Use as instructed  Patient not taking: Reported on 02/07/2024   Theotis Haze ORN, NP  Active Self  metFORMIN  (GLUCOPHAGE ) 500 MG tablet [id number] Yes Take 1 tablet (500 mg total) by mouth 2 (two) times daily with a meal. Dwan Aldo M, PA-C  Active   oxyCODONE  (OXY IR/ROXICODONE ) 5 MG immediate release tablet [id number] Yes Take 1 tablet (5 mg total) by mouth every 6 (six) hours as needed for severe pain (pain score 7-10). Dwan Aldo M, PA-C  Active   potassium chloride  (KLOR-CON  M) 20 MEQ tablet [id number] Yes Take 1.5 tablets (30 mEq total) by mouth daily. For 5 days then stop Zimmerman, Donielle M, PA-C  Active   TRUEplus Lancets 28G  MISC [id number]  Use as instructed. Check blood glucose level by fingerstick twice per day.  Patient not taking: Reported on 02/07/2024   Theotis Haze ORN, NP  Active Self            Home Care and Equipment/Supplies: Were Home Health Services Ordered?: No Any new equipment or medical supplies ordered?: Yes Name of Medical supply agency?: RW received prior to d/c Were you able to get the  equipment/medical supplies?: Yes Do you have any questions related to the use of the equipment/supplies?: No  Functional Questionnaire: Do you need assistance with bathing/showering or dressing?: No Do you need assistance with meal preparation?: No Do you need assistance with eating?: No Do you have difficulty maintaining continence: No Do you need assistance with getting out of bed/getting out of a chair/moving?: No Do you have difficulty managing or taking your medications?: No  Follow up appointments reviewed: PCP Follow-up appointment confirmed?: No (Patient will call to schedule) MD Provider Line Number:[phone] Given: No Specialist Hospital Follow-up appointment confirmed?: Yes Date of Specialist follow-up appointment?: 02/20/24 Follow-Up Specialty Provider:: Dr. Dick/Cardiology on 7/8 and Dr. Hendrickson/Cardiology 7/15 Do you need transportation to your follow-up appointment?: No Do you understand care options if your condition(s) worsen?: Yes-patient verbalized understanding  SDOH Interventions Today    Flowsheet Row Most Recent Value  SDOH Interventions   Food Insecurity Interventions Intervention Not Indicated  Housing Interventions Intervention Not Indicated  Transportation Interventions Intervention Not Indicated  Utilities Interventions Intervention Not Indicated   RNCM sent secure communication to PCP to request order for glucometer.  Andrea Dimes RN, BSN [place]  [facility] RN Care Manager [phone]

## 2024-02-08 MED FILL — Mannitol IV Soln 20%: INTRAVENOUS | Qty: 500 | Status: AC

## 2024-02-08 MED FILL — Heparin Sodium (Porcine) Inj 1000 Unit/ML: Qty: 1000 | Status: AC

## 2024-02-08 MED FILL — Heparin Sodium (Porcine) Inj 1000 Unit/ML: INTRAMUSCULAR | Qty: 10 | Status: AC

## 2024-02-08 MED FILL — Potassium Chloride Inj 2 mEq/ML: INTRAVENOUS | Qty: 40 | Status: AC

## 2024-02-08 MED FILL — Magnesium Sulfate Inj 50%: INTRAMUSCULAR | Qty: 10 | Status: AC

## 2024-02-08 MED FILL — Lidocaine HCl Local Soln Prefilled Syringe 100 MG/5ML (2%): INTRAMUSCULAR | Qty: 5 | Status: AC

## 2024-02-08 MED FILL — Sodium Chloride IV Soln 0.9%: INTRAVENOUS | Qty: 2000 | Status: AC

## 2024-02-08 MED FILL — Sodium Bicarbonate IV Soln 8.4%: INTRAVENOUS | Qty: 50 | Status: AC

## 2024-02-08 MED FILL — Electrolyte-R (PH 7.4) Solution: INTRAVENOUS | Qty: 4000 | Status: AC

## 2024-02-19 NOTE — Progress Notes (Unsigned)
 Cardiology Office Note    Patient Name: Zachary [name] Date of Encounter: 02/20/2024  Primary Care Provider:  Theotis [name] ORN, NP Primary Cardiologist:  Zachary Bihari, MD Primary Electrophysiologist: None   Past Medical History    Past Medical History:  Diagnosis Date   Diabetes mellitus without complication (HCC)    Hypertension    Stroke [facility]) 05/2017   Right Pontine Stroke    History of Present Illness  Zachary [name] is a 53 y.o. male with a PMH of CAD s/p 3v CAD and CABG x 4 (LIMA to LAD, SVG to acute marginal, SVG to OM, and SVG to Diagona), right/left pontine CVA 2018, DM type II, HTN, HLD and adjustment disorder with anxiety who presents today for posthospital follow-up.  Zachary [name] presented to the ED on 01/23/2024 with complaint of eating issues complicated regurgitation following meals. He had an extensive GI workup which was unrevealing.  He also endorsed exertional fatigue and high sensitivity troponins were 126 and 125.  2D echo was completed showing EF of 55 to 60% with mild LVH and grade 1 DD with small inferior apical wall motion abnormality.  He completed a coronary CTA to rule out CAD that showed calcium  score of 1442 with 70-99% mLAD/D1/OM1/ostial ramus with 50-70% AR.LHC was recommended that showed severe three-vessel CAD including CTO's of the mid LAD and distal RCA as well as severe stenosis of the D1 and OM.  CVTS was consulted with recommendation to undergo CABG after Plavix  washout.  He was discharged on 01/28/2024 underwent scheduled bypass on 02/02/2024.He underwent a CABG x 4 utilizing LIMA to LAD, SVG to acute marginal, SVG to OM, and SVG to Diagonal as well as endoscopic harvest of the right greater saphenous vein. His radial artery was not felt to be sufficient for arterial graft so it was not harvested. He tolerated the procedure well and had Plavix  restarted on 02/05/2024 and was discharged on 02/06/2024.  He is scheduled to follow-up with CVTS on 02/27/2024.  Mr.  [name] presents today for post bypass follow-up with his wife and daughter. He underwent coronary artery bypass grafting on June 20th, with four bypasses performed. Post-surgery, he has experienced improvement in exertional fatigue and complete resolution of regurgitation. He was restarted on Plavix  on June 23rd and discharged on June 24th. He has been experiencing tolerable pain, occasionally requiring oxycodone , especially in the evenings before sleep. No swelling has been noted since the procedure, and his blood pressure and heart rate are stable. He has been using a spirometer regularly to prevent pneumonia and reports no issues with fast heartbeats or swelling. He is currently on Lasix , which was prescribed for five days post-surgery, and he has not experienced any swelling since then. Regarding his gastrointestinal issues, the regurgitation has fully stopped post-surgery. He was not on Prilosec prior to the surgery. He has a history of visiting a gastroenterologist, where no significant issues were found. He is gradually returning to normal activities, including walking inside the house. His appetite is improving, and he is focusing on weight loss. Patient denies chest pain, palpitations, dyspnea, PND, orthopnea, nausea, vomiting, dizziness, syncope, edema, weight gain, or early satiety.  Discussed the use of AI scribe software for clinical note transcription with the patient, who gave verbal consent to proceed.  History of Present Illness   Review of Systems  Please see the history of present illness.    All other systems reviewed and are otherwise negative except as noted above.  Physical Exam  Wt Readings from Last 3 Encounters:  02/20/24 172 lb 9.6 oz (78.3 kg)  02/06/24 175 lb 6.4 oz (79.6 kg)  02/01/24 178 lb 14.4 oz (81.1 kg)   VS: Vitals:   02/20/24 0838  BP: 122/78  Pulse: 92  SpO2: 98%  ,Body mass index is 24.77 kg/m. GEN: Well nourished, well developed in no acute  distress Neck: No JVD; No carotid bruits Pulmonary: Clear to auscultation without rales, wheezing or rhonchi  Cardiovascular: Normal rate. Regular rhythm. Normal S1. Normal S2.   Murmurs: There is no murmur.  ABDOMEN: Soft, non-tender, non-distended EXTREMITIES:  No edema; No deformity   EKG/LABS/ Recent Cardiac Studies   ECG personally reviewed by me today -none completed today  Risk Assessment/Calculations:    CHA2DS2-VASc Score =     This indicates a  % annual risk of stroke. The patient's score is based upon:           Lab Results  Component Value Date   WBC 11.4 (H) 02/06/2024   HGB 11.6 (L) 02/06/2024   HCT 34.5 (L) 02/06/2024   MCV 82.3 02/06/2024   PLT 334 02/06/2024   Lab Results  Component Value Date   CREATININE 1.01 02/06/2024   BUN 14 02/06/2024   NA 138 02/06/2024   K 3.8 02/06/2024   CL 100 02/06/2024   CO2 25 02/06/2024   Lab Results  Component Value Date   CHOL 147 01/24/2024   HDL 43 01/24/2024   LDLCALC 91 01/24/2024   TRIG 66 01/24/2024   CHOLHDL 3.4 01/24/2024    Lab Results  Component Value Date   HGBA1C 6.6 (H) 01/24/2024   Assessment & Plan    Assessment & Plan   1.  CAD: -s/p LHC on 01/25/2024 with three-vessel CAD and subsequent CABG x 4 on 02/02/2024  - Today patient is doing well with no further complaints of fatigue  No chest pain, arrhythmias, or swelling. Blood pressure and heart rate controlled. - Order chest x-ray to rule out pneumonia or fluid accumulation. - Check CBC and BMET - Clear for cardiac rehab. - Encourage walking with caution to avoid overexertion. - Follow up with thoracic doctors on July 15th.  2.  Essential hypertension: - Patient's blood pressure today was stable at 122/78 - Continue Norvasc  10 mg daily, carvedilol  12.5 mg twice daily  3.  History of CVA: -Doing well no new neurological symptoms reported. - Continue Plavix  75 mg and ASA 81 mg  4.  Hyperlipidemia: - Patient's last LDL cholesterol  was 91 - Continue Lipitor  80 mg daily and will recheck LFT and lipids in 3 months  5.  Consolidation of right lower lobe post-CABG: Slight consolidation noted on right side. Using spirometer regularly. - Continue using spirometer for breathing exercises. - Order chest x-ray to check for pneumonia or fluid. - Resume Lasix  if fluid is detected on chest x-ray.    Cardiac Rehabilitation Eligibility Assessment  The patient is ready to start cardiac rehabilitation pending clearance from the cardiac surgeon.     Disposition: Follow-up with Zachary Bihari, MD or APP in 3 months   Signed, Wyn Raddle, Jackee Shove, NP 02/20/2024, 12:20 PM [facility]

## 2024-02-20 ENCOUNTER — Other Ambulatory Visit: Payer: Self-pay

## 2024-02-20 ENCOUNTER — Ambulatory Visit: Attending: Nurse Practitioner | Admitting: Nurse Practitioner

## 2024-02-20 ENCOUNTER — Ambulatory Visit: Payer: Self-pay | Admitting: Nurse Practitioner

## 2024-02-20 ENCOUNTER — Ambulatory Visit (HOSPITAL_COMMUNITY)
Admission: RE | Admit: 2024-02-20 | Discharge: 2024-02-20 | Disposition: A | Discharge status: Home / Self Care | Payer: MEDICAID | Source: Ambulatory Visit | Attending: Nurse Practitioner | Admitting: Nurse Practitioner

## 2024-02-20 ENCOUNTER — Encounter: Payer: Self-pay | Admitting: Nurse Practitioner

## 2024-02-20 VITALS — BP 122/78 | HR 92 | Ht 70.0 in | Wt 172.6 lb

## 2024-02-20 DIAGNOSIS — I2510 Atherosclerotic heart disease of native coronary artery without angina pectoris: Secondary | ICD-10-CM | POA: Insufficient documentation

## 2024-02-20 DIAGNOSIS — E785 Hyperlipidemia, unspecified: Secondary | ICD-10-CM

## 2024-02-20 DIAGNOSIS — I10 Essential (primary) hypertension: Secondary | ICD-10-CM

## 2024-02-20 DIAGNOSIS — J181 Lobar pneumonia, unspecified organism: Secondary | ICD-10-CM | POA: Diagnosis present

## 2024-02-20 DIAGNOSIS — Z8673 Personal history of transient ischemic attack (TIA), and cerebral infarction without residual deficits: Secondary | ICD-10-CM | POA: Insufficient documentation

## 2024-02-20 LAB — CBC

## 2024-02-20 NOTE — Patient Instructions (Addendum)
 Medication Instructions:  Your physician recommends that you continue on your current medications as directed. Please refer to the Current Medication list given to you today.  *If you need a refill on your cardiac medications before your next appointment, please call your pharmacy*  Lab Work: [facility] & BMET If you have labs (blood work) drawn today and your tests are completely normal, you will receive your results only by: MyChart Message (if you have MyChart) OR A paper copy in the mail If you have any lab test that is abnormal or we need to change your treatment, we will call you to review the results.  Testing/Procedures: A chest x-ray takes a picture of the organs and structures inside the chest, including the heart, lungs, and blood vessels. This test can show several things, including, whether the heart is enlarges; whether fluid is building up in the lungs; and whether pacemaker / defibrillator leads are still in place.   Follow-Up: At [facility], you and your health needs are our priority.  As part of our continuing mission to provide you with exceptional heart care, our providers are all part of one team.  This team includes your primary Cardiologist (physician) and Advanced Practice Providers or APPs (Physician Assistants and Nurse Practitioners) who all work together to provide you with the care you need, when you need it.  Your next appointment:   3 month(s)  Provider:   Jackee Alberts, NP       We recommend signing up for the patient portal called MyChart.  Sign up information is provided on this After Visit Summary.  MyChart is used to connect with patients for Virtual Visits (Telemedicine).  Patients are able to view lab/test results, encounter notes, upcoming appointments, etc.  Non-urgent messages can be sent to your provider as well.   To learn more about what you can do with MyChart, go to [email or web address].   Other Instructions DEEP BREATHING WHEN  COUCHING NO DRIVING  NO HEAVY LIFTING

## 2024-02-21 ENCOUNTER — Telehealth (HOSPITAL_COMMUNITY): Payer: Self-pay

## 2024-02-21 LAB — CBC
Hematocrit: 38.7 (ref 37.5–51.0)
Hemoglobin: 12.3 g/dL — AB (ref 13.0–17.7)
MCH: 27.6 pg (ref 26.6–33.0)
MCHC: 31.8 g/dL (ref 31.5–35.7)
MCV: 87 fL (ref 79–97)
Platelets: 753 x10E3/uL — AB (ref 150–450)
RBC: 4.45 x10E6/uL (ref 4.14–5.80)
RDW: 13 (ref 11.6–15.4)
WBC: 8.1 x10E3/uL (ref 3.4–10.8)

## 2024-02-21 LAB — BASIC METABOLIC PANEL WITH GFR
BUN/Creatinine Ratio: 15 (ref 9–20)
BUN: 15 mg/dL (ref 6–24)
CO2: 19 mmol/L — ABNORMAL LOW (ref 20–29)
Calcium: 9.8 mg/dL (ref 8.7–10.2)
Chloride: 101 mmol/L (ref 96–106)
Creatinine, Ser: 1.03 mg/dL (ref 0.76–1.27)
Glucose: 113 mg/dL — ABNORMAL HIGH (ref 70–99)
Potassium: 4.8 mmol/L (ref 3.5–5.2)
Sodium: 137 mmol/L (ref 134–144)
eGFR: 86 mL/min/1.73 (ref >59)

## 2024-02-21 NOTE — Telephone Encounter (Signed)
 Attempted to call patient in regards to interest in cardiac rehab- no answer, left message to call us  back. Sent MyChart message.

## 2024-02-22 MED ORDER — POTASSIUM CHLORIDE CRYS ER 20 MEQ PO TBCR: 20.0000 meq | EXTENDED_RELEASE_TABLET | Freq: Every day | ORAL | 0 refills | Status: DC

## 2024-02-22 MED ORDER — FUROSEMIDE 20 MG PO TABS: 20.0000 mg | ORAL_TABLET | Freq: Every day | ORAL | 0 refills | Status: DC

## 2024-02-22 NOTE — Telephone Encounter (Signed)
 Patient is following up. He would like clarification on results + medication instructions.

## 2024-02-22 NOTE — Progress Notes (Signed)
 Zachary [name]

## 2024-02-23 ENCOUNTER — Telehealth (HOSPITAL_COMMUNITY): Payer: Self-pay

## 2024-02-23 ENCOUNTER — Other Ambulatory Visit: Payer: Self-pay | Admitting: Thoracic Surgery (Cardiothoracic Vascular Surgery)

## 2024-02-23 DIAGNOSIS — I2510 Atherosclerotic heart disease of native coronary artery without angina pectoris: Secondary | ICD-10-CM

## 2024-02-23 NOTE — Telephone Encounter (Signed)
 Patient returned call regarding interest in the Cardiac Rehab Program. Patient expressed interest. Explained scheduling process and went over insurance, patient verbalized understanding. Will contact patient for scheduling once f/u has been completed on 7/15.

## 2024-02-27 ENCOUNTER — Ambulatory Visit (HOSPITAL_COMMUNITY)
Admission: RE | Admit: 2024-02-27 | Discharge: 2024-02-27 | Disposition: A | Discharge status: Home / Self Care | Payer: MEDICAID | Source: Ambulatory Visit | Attending: Internal Medicine | Admitting: Internal Medicine

## 2024-02-27 ENCOUNTER — Other Ambulatory Visit: Payer: Self-pay | Admitting: Thoracic Surgery (Cardiothoracic Vascular Surgery)

## 2024-02-27 ENCOUNTER — Other Ambulatory Visit: Payer: Self-pay | Admitting: *Deleted

## 2024-02-27 ENCOUNTER — Ambulatory Visit: Payer: Self-pay | Attending: Thoracic Surgery (Cardiothoracic Vascular Surgery)

## 2024-02-27 VITALS — BP 123/82 | HR 88 | Resp 20 | Ht 70.0 in | Wt 171.0 lb

## 2024-02-27 DIAGNOSIS — Z951 Presence of aortocoronary bypass graft: Secondary | ICD-10-CM

## 2024-02-27 DIAGNOSIS — I728 Aneurysm of other specified arteries: Secondary | ICD-10-CM

## 2024-02-27 DIAGNOSIS — I2510 Atherosclerotic heart disease of native coronary artery without angina pectoris: Secondary | ICD-10-CM | POA: Insufficient documentation

## 2024-02-27 NOTE — Progress Notes (Signed)
 [address] [place] [address]             [phone]       HPI: Patient returns for routine postoperative follow-up having undergone CORONARY ARTERY BYPASS GRAFTING X 4, USING LEFT INTERNAL MAMMARY ARTERY AND ENDOSCOPIC HARVESTED RIGHT GREATER SAPHENOUS VEIN  on 02/02/2024 with Dr. Kerrin. He has a past medical history of type 2 diabetes, hyperlipidemia, hypertension, and right/left pontine stroke.  The postoperative recovery was uneventful and patient was discharged after 4 days. His blood pressure has remained under control with current medications  Since hospital discharge the patient reports that he overall is doing well.  He had some sternal pain early on but now reports that it has improved. He is no longer taking pain medications.  He denies shortness of breath, chest pain, nausea, dizziness and lower leg swelling. Incision sites have been healing well and denies erythema and drainage.    Current Outpatient Medications  Medication Sig Dispense Refill   Accu-Chek Softclix Lancets lancets Check glucose bu fingerstick twice a day. 100 each 3   amLODipine  (NORVASC ) 10 MG tablet Take 1 tablet (10 mg total) by mouth daily. 90 tablet 1   aspirin  EC 81 MG tablet Take 1 tablet (81 mg total) by mouth daily. Swallow whole.     atorvastatin  (LIPITOR ) 80 MG tablet Take 1 tablet (80 mg total) by mouth daily. 90 tablet 3   blood glucose meter kit and supplies KIT Dispense based on patient and insurance preference. Use up to four times daily as directed. (FOR ICD-9 250.00, 250.01). 1 each 0   Blood Glucose Monitoring Suppl (ACCU-CHEK GUIDE ME) w/Device KIT Use as directed 2 (two) times daily. 1 kit 0   carvedilol  (COREG ) 12.5 MG tablet Take 1 tablet (12.5 mg total) by mouth 2 (two) times daily. 180 tablet 3   clopidogrel  (PLAVIX ) 75 MG tablet Take 1 tablet (75 mg total) by mouth daily. 90 tablet 1   furosemide  (LASIX ) 20 MG tablet Take 1 tablet (20 mg total) by  mouth daily. As directed 30 tablet 0   glucose blood (TRUE METRIX BLOOD GLUCOSE TEST) test strip Use as instructed. Check blood glucose level by fingerstick twice per day. 100 each 12   metFORMIN  (GLUCOPHAGE ) 500 MG tablet Take 1 tablet (500 mg total) by mouth 2 (two) times daily with a meal. 60 tablet 1   oxyCODONE  (OXY IR/ROXICODONE ) 5 MG immediate release tablet Take 1 tablet (5 mg total) by mouth every 6 (six) hours as needed for severe pain (pain score 7-10). 28 tablet 0   potassium chloride  SA (KLOR-CON  M20) 20 MEQ tablet Take 1 tablet (20 mEq total) by mouth daily. As directed (Patient not taking: Reported on 02/27/2024) 30 tablet 0   No current facility-administered medications for this visit.    Review of Systems  Constitutional: Negative.   Respiratory: Negative.  Negative for cough and shortness of breath.   Cardiovascular: Negative.  Negative for chest pain.  Skin: Negative.     Physical Exam Constitutional:      Appearance: Normal appearance.  HENT:     Head: Normocephalic and atraumatic.  Cardiovascular:     Rate and Rhythm: Normal rate and regular rhythm.     Heart sounds: Normal heart sounds, S1 normal and S2 normal.     Comments: No sternal instability Pulmonary:     Effort: Pulmonary effort is normal.     Breath  sounds: Normal breath sounds.  Musculoskeletal:     Right ankle: No swelling.     Left ankle: No swelling.  Skin:    General: Skin is warm and dry.     Comments: Chest incision scabbed over without erythema or drainage  Lower leg incisions scabbed without erythema and discharge   Neurological:     General: No focal deficit present.     Mental Status: He is alert and oriented to person, place, and time.      Diagnostic Tests: CLINICAL DATA:  Coronary artery disease involving native coronary artery of native heart without angina pectoris.   EXAM: CHEST - 2 VIEW   COMPARISON:  Chest radiograph 02/20/2024   FINDINGS: No pulmonary edema. Heart  size is normal. Post CABG changes with median sternotomy wires. No acute bone abnormality. Minimal blunting at the costophrenic angles. Calcification in left upper abdomen could be an atherosclerotic calcification involving the splenic artery. Cannot exclude a 1 cm splenic artery aneurysm.   IMPRESSION: 1. No acute chest findings. Minimal blunting at the costophrenic angles and there may be trace pleural fluid. No significant change from the prior examination. 2. Possible 1 cm splenic artery aneurysm. This could be confirmed with abdominal CT.     Electronically Signed   By: Juliene Balder M.D.   On: 02/27/2024 12:54     Assessment/Plan:  S/P CABG x 4 We discussed chest x-ray results. At time of visit chest xray impression was not present.  After x-ray was read by radiology it was found that patient had a calcification in the left upper abdomen.  It was recommended by radiologist that patient have an abdominal CT scan.  This has been ordered. Called patient to discuss this.  We also discussed driving which he has not been doing.  Told patient that it was ok to start driving since he is not using pain medications. He is interested in participating in cardiac rehab and the order has been placed.  Discussed continuance of sternal precautions until full 6-8 weeks out from surgery.   Patient also requested a social work referral since he has been having difficulties with finances and filing for disability.  Sent in referral at this time.   Patient is to continue follow up with cardiology and PCP.  Last cardiology visit was 02/20/2024 and no medication changes were made.  Continue with current medications. He can follow up with the office as needed.    Manuelita CHRISTELLA Rough, PA-C [facility] [phone]

## 2024-02-27 NOTE — Patient Instructions (Addendum)
-  Will get abdominal CT scan for finding on xray

## 2024-02-28 ENCOUNTER — Telehealth: Payer: Self-pay | Admitting: Licensed Clinical Social Worker

## 2024-02-28 NOTE — Telephone Encounter (Signed)
 H&V Care Navigation CSW Progress Note  Clinical Social Worker contacted patient by phone to f/u on referral for questions about disability/assistance. No answer today at [phone]. Will re-attempt again as able.  Patient is participating in a Managed Medicaid Plan:  Unclear, card on file but no benefits pulling will review  SDOH Screenings   Food Insecurity: No Food Insecurity (02/07/2024)  Housing: Unknown (02/07/2024)  Transportation Needs: No Transportation Needs (02/07/2024)  Utilities: Not At Risk (02/07/2024)  Alcohol Screen: Low Risk  (08/02/2023)  Depression (PHQ2-9): Low Risk  (02/07/2024)  Financial Resource Strain: Patient Declined (08/02/2023)  Physical Activity: Unknown (08/02/2023)  Social Connections: Unknown (08/02/2023)  Stress: No Stress Concern Present (08/02/2023)  Tobacco Use: Low Risk  (02/27/2024)     Marit Lark, MSW, LCSW Clinical Social Worker II [facility] Heart/Vascular Care Navigation  [phone]- work cell phone (preferred)

## 2024-02-29 ENCOUNTER — Telehealth: Payer: Self-pay | Admitting: Licensed Clinical Social Worker

## 2024-02-29 ENCOUNTER — Ambulatory Visit (HOSPITAL_COMMUNITY)
Admission: RE | Admit: 2024-02-29 | Discharge: 2024-02-29 | Disposition: A | Discharge status: Home / Self Care | Source: Ambulatory Visit | Attending: Thoracic Surgery (Cardiothoracic Vascular Surgery) | Admitting: Thoracic Surgery (Cardiothoracic Vascular Surgery)

## 2024-02-29 DIAGNOSIS — I700 Atherosclerosis of aorta: Secondary | ICD-10-CM | POA: Insufficient documentation

## 2024-02-29 DIAGNOSIS — K5730 Diverticulosis of large intestine without perforation or abscess without bleeding: Secondary | ICD-10-CM | POA: Insufficient documentation

## 2024-02-29 DIAGNOSIS — I3139 Other pericardial effusion (noninflammatory): Secondary | ICD-10-CM | POA: Diagnosis not present

## 2024-02-29 DIAGNOSIS — J90 Pleural effusion, not elsewhere classified: Secondary | ICD-10-CM | POA: Diagnosis not present

## 2024-02-29 DIAGNOSIS — I728 Aneurysm of other specified arteries: Secondary | ICD-10-CM | POA: Insufficient documentation

## 2024-02-29 MED ORDER — IOHEXOL 350 MG/ML SOLN
100.0000 mL | Freq: Once | INTRAVENOUS | Status: AC | PRN
  Administered 2024-02-29: 100 mL via INTRAVENOUS

## 2024-02-29 NOTE — Telephone Encounter (Signed)
 H&V Care Navigation CSW Progress Note  Clinical Social Worker contacted patient by phone to f/u on referral for questions about disability/assistance. No answer today at [phone],note he is at imaging appt, left 2nd voicemail. Will re-attempt again as able.   Patient is participating in a Managed Medicaid Plan: Yes, [employer] but no pharm benefits pulling if pt returns call will route to Prior Auth team to see if active.   SDOH Screenings   Food Insecurity: No Food Insecurity (02/07/2024)  Housing: Unknown (02/07/2024)  Transportation Needs: No Transportation Needs (02/07/2024)  Utilities: Not At Risk (02/07/2024)  Alcohol Screen: Low Risk  (08/02/2023)  Depression (PHQ2-9): Low Risk  (02/07/2024)  Financial Resource Strain: Patient Declined (08/02/2023)  Physical Activity: Unknown (08/02/2023)  Social Connections: Unknown (08/02/2023)  Stress: No Stress Concern Present (08/02/2023)  Tobacco Use: Low Risk  (02/27/2024)     Marit Lark, MSW, LCSW Clinical Social Worker II [facility] Heart/Vascular Care Navigation  [phone]- work cell phone (preferred)

## 2024-03-01 ENCOUNTER — Telehealth: Payer: Self-pay | Admitting: Licensed Clinical Social Worker

## 2024-03-01 NOTE — Progress Notes (Signed)
 Heart and Vascular Care Navigation  03/01/2024  Zachary [name] [facility] 07-11-1971 [id number]  Reason for Referral: disability referral; financial assistance Patient is participating in a Managed Medicaid Plan:Yes- [facility]  Engaged with patient by telephone for initial visit for Heart and Vascular Care Coordination.                                                                                                   Assessment:                                     LCSW received a call back today from pt. Introduced self, role, reason for call. Confirmed home address, however pt is staying with his ex wife and adult daughter at this time for assistance. He has not worked since April of this year, was previously employed full time and receiving benefits. He is not sure if he had any short term benefits through his work. Encouraged pt to f/u with work for any benefits but that it may be past time where they can be accessed. Currently pt receiving assistance for rent from family and savings, he does express some financial hardship. He thinks his daughter may have started an application for disability through social security but he isn't sure.   I encouraged him to speak with his daughter about disability filing, if she has not or has questions she can give me a call. We discussed [facility] referral if needed and how they may be able to provide assistance. We also discussed rent/utility assistance programs locally. Cautioned that disability usually takes 6-12 months plus to process and that he should consider if keeping apartment is the best option financially. Will discuss further once I have clarity regarding disability.   HRT/VAS Care Coordination     Patients Home Cardiology Office TCTS   Outpatient Care Team Social Worker   Social Worker Name: Marit Lark, [place], [phone]   Living arrangements for the past 2 months Apartment   Lives with: Other (Comment); Adult Children  daughter and ex wife    Patient Current Insurance Coverage Medicaid   Patient Has Concern With Paying Medical Bills No   Does Patient Have Prescription Coverage? Yes   Home Assistive Devices/Equipment Walker (specify type); Scales   DME Agency [employer]   [facility] Agency NA       Social History:                                                                             SDOH Screenings   Food Insecurity: No Food Insecurity (03/01/2024)  Housing: High Risk (03/01/2024)  Transportation Needs: No Transportation Needs (03/01/2024)  Utilities: Not At Risk (03/01/2024)  Alcohol Screen: Low Risk  (08/02/2023)  Depression (PHQ2-9):  Low Risk  (02/07/2024)  Financial Resource Strain: High Risk (03/01/2024)  Physical Activity: Unknown (08/02/2023)  Social Connections: Unknown (08/02/2023)  Stress: No Stress Concern Present (08/02/2023)  Tobacco Use: Low Risk  (02/27/2024)  Health Literacy: Adequate Health Literacy (03/01/2024)    SDOH Interventions: Financial Resources:  Surveyor, quantity Strain Interventions: [employer] Provided (utility/rent assistance packet; will f/u about referral to [employer]) DSS for financial assistance and Tree surgeon for Medical illustrator Insecurity:  Food Insecurity Interventions: Intervention Not Indicated  Housing Insecurity:  Housing Interventions: [employer] Provided (will send rent/utility assistance information packet)  Transportation:   Transportation Interventions: Patient Resources (Friends/Family), Payor Benefit   Other Care Navigation Interventions:     Provided Pharmacy assistance resources  Pt shares he has had no difficulty since enrolling in [place] with his medications- gets [facility]   Follow-up plan:   Will f/u if I do not hear from pt again about whether or not he has filed for disability or is needing assistance with that and will send rent/utility assistance information as well at the time pending if we need ROI for  [facility].

## 2024-03-06 ENCOUNTER — Telehealth: Payer: Self-pay | Admitting: Licensed Clinical Social Worker

## 2024-03-06 NOTE — Telephone Encounter (Signed)
 H&V Care Navigation CSW Progress Note  Clinical Social Worker contacted patient by phone to f/u on any updates regarding disability. I had asked pt during our previous call to speak with his daughter about whether or not she had completed an application for him. No answer at [phone], left voicemail requesting return call. Will re-attempt again as able.  Patient is participating in a Managed Medicaid Plan:  Yes  SDOH Screenings   Food Insecurity: No Food Insecurity (03/01/2024)  Housing: High Risk (03/01/2024)  Transportation Needs: No Transportation Needs (03/01/2024)  Utilities: Not At Risk (03/01/2024)  Alcohol Screen: Low Risk  (08/02/2023)  Depression (PHQ2-9): Low Risk  (02/07/2024)  Financial Resource Strain: High Risk (03/01/2024)  Physical Activity: Unknown (08/02/2023)  Social Connections: Unknown (08/02/2023)  Stress: No Stress Concern Present (08/02/2023)  Tobacco Use: Low Risk  (02/27/2024)  Health Literacy: Adequate Health Literacy (03/01/2024)    Marit Lark, MSW, LCSW Clinical Social Worker II [facility] Heart/Vascular Care Navigation  [phone]- work cell phone (preferred)

## 2024-03-11 ENCOUNTER — Telehealth: Payer: Self-pay | Admitting: Licensed Clinical Social Worker

## 2024-03-11 NOTE — Telephone Encounter (Signed)
 H&V Care Navigation CSW Progress Note  Clinical Social Worker contacted patient by phone to f/u on referral for disability. No answer again today at [phone]. Left voicemail. Pt returned call this afternoon. Shares he has received information from [employer] confirming his application was received. LCSW will send resources for rent/utilities and food assistance. Remain available should pt have any additional questions. No additional questions shared at this time when asked.  Patient is participating in a Managed Medicaid Plan:  Yes  SDOH Screenings   Food Insecurity: No Food Insecurity (03/01/2024)  Housing: High Risk (03/01/2024)  Transportation Needs: No Transportation Needs (03/01/2024)  Utilities: Not At Risk (03/01/2024)  Alcohol Screen: Low Risk  (08/02/2023)  Depression (PHQ2-9): Low Risk  (02/07/2024)  Financial Resource Strain: High Risk (03/01/2024)  Physical Activity: Unknown (08/02/2023)  Social Connections: Unknown (08/02/2023)  Stress: No Stress Concern Present (08/02/2023)  Tobacco Use: Low Risk  (02/27/2024)  Health Literacy: Adequate Health Literacy (03/01/2024)    Marit Lark, MSW, LCSW Clinical Social Worker II [facility] Heart/Vascular Care Navigation  [phone]- work cell phone (preferred)

## 2024-03-15 ENCOUNTER — Telehealth: Payer: Self-pay

## 2024-03-15 DIAGNOSIS — K5730 Diverticulosis of large intestine without perforation or abscess without bleeding: Secondary | ICD-10-CM

## 2024-03-15 NOTE — Telephone Encounter (Signed)
 Diverticula of intestine  Called patient to discuss CT scan of abdomen results.  This was ordered after chest xray showed a possible splenic artery aneurysm. 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 Curvilinear calcific densities in the left upper quadrant radiography correspond to hyperdense material or calcification within diverticulum at the splenic flexure.  Discussed that no further follow up is needed at this time. All questions answered.   Imaging:   CLINICAL DATA:  Possible splenic artery aneurysm   EXAM: CT ABDOMEN WITHOUT AND WITH CONTRAST   TECHNIQUE: Multidetector CT imaging of the abdomen was performed following the standard protocol before and following the bolus administration of intravenous contrast.   RADIATION DOSE REDUCTION: This exam was performed according to the departmental dose-optimization program which includes automated exposure control, adjustment of the mA and/or kV according to patient size and/or use of iterative reconstruction technique.   CONTRAST:  OMNIPAQUE  IOHEXOL  350 MG/ML SOLN   COMPARISON:  Chest x-ray 02/27/2024   FINDINGS: Lower chest: Lung bases demonstrate no acute airspace disease. Small bilateral pleural effusions. Coronary vascular calcification. Upper normal cardiac size. Small volume pericardial effusion. Partially imaged lower sternotomy changes.   Hepatobiliary: No focal liver abnormality is seen. No gallstones, gallbladder wall thickening, or biliary dilatation.   Pancreas: Unremarkable. No pancreatic ductal dilatation or surrounding inflammatory changes.   Spleen: Normal in size without focal abnormality.   Adrenals/Urinary Tract: Adrenal glands are unremarkable. Kidneys show no hydronephrosis. Hyperdensity within the bilateral renal collecting systems on the non contrasted images suggestive of  small volume of excreted contrast. Small stones not excluded.   Negative for mass   Stomach/Bowel: Stomach within normal limits. No acute bowel wall thickening. Diverticular disease of the colon. Curvilinear calcific densities in the left upper quadrant correspond to hyperdense material or calcification within diverticulum at the splenic flexure, this corresponds to the radiographic curvilinear calcification   Vascular/Lymphatic: Nonaneurysmal aorta. Atherosclerosis. Negative for splenic artery aneurysm. No suspicious lymph nodes   Other: Negative for upper abdominal ascites or free air   Musculoskeletal: No acute osseous abnormality   IMPRESSION: 1. Negative for splenic artery aneurysm. 2. Diverticular disease of the colon without acute inflammatory process. Curvilinear calcific densities in the left upper quadrant on radiography correspond to hyperdense material or calcification within diverticulum at the splenic flexure. 3. Small bilateral pleural effusions. Small volume pericardial effusion. 4. Aortic atherosclerosis.   Aortic Atherosclerosis (ICD10-I70.0).     Electronically Signed   By: Luke Bun M.D.   On: 03/07/2024 21:31

## 2024-03-21 ENCOUNTER — Telehealth: Payer: Self-pay

## 2024-03-21 DIAGNOSIS — Z951 Presence of aortocoronary bypass graft: Secondary | ICD-10-CM

## 2024-03-21 DIAGNOSIS — I25118 Atherosclerotic heart disease of native coronary artery with other forms of angina pectoris: Secondary | ICD-10-CM

## 2024-03-21 NOTE — Telephone Encounter (Signed)
 Call to patient to set up GXT so patient can qualify for cardiac rehab. Spoke with spouse who agrees to plan. Order placed.

## 2024-03-22 ENCOUNTER — Ambulatory Visit: Payer: Self-pay | Admitting: Cardiology

## 2024-03-22 ENCOUNTER — Other Ambulatory Visit: Payer: Self-pay | Admitting: Nurse Practitioner

## 2024-03-22 ENCOUNTER — Ambulatory Visit (HOSPITAL_COMMUNITY)
Admission: RE | Admit: 2024-03-22 | Discharge: 2024-03-22 | Disposition: A | Discharge status: Home / Self Care | Payer: MEDICAID | Source: Ambulatory Visit | Attending: Cardiology | Admitting: Cardiology

## 2024-03-22 DIAGNOSIS — R942 Abnormal results of pulmonary function studies: Secondary | ICD-10-CM

## 2024-03-22 DIAGNOSIS — Z951 Presence of aortocoronary bypass graft: Secondary | ICD-10-CM | POA: Insufficient documentation

## 2024-03-22 DIAGNOSIS — I25118 Atherosclerotic heart disease of native coronary artery with other forms of angina pectoris: Secondary | ICD-10-CM | POA: Diagnosis present

## 2024-03-22 LAB — EXERCISE TOLERANCE TEST
Angina Index: 0
Duke Treadmill Score: 3
Estimated workload: 4.6
Exercise duration (min): 3 min
Exercise duration (sec): 0 s
MPHR: 165 {beats}/min
Peak HR: 117 {beats}/min
Percent HR: 70 %
Rest HR: 87 {beats}/min
ST Depression (mm): 0 mm

## 2024-04-01 ENCOUNTER — Other Ambulatory Visit: Payer: Self-pay | Admitting: Nurse Practitioner

## 2024-04-02 ENCOUNTER — Telehealth: Payer: Self-pay | Admitting: Cardiology

## 2024-04-02 ENCOUNTER — Telehealth (HOSPITAL_COMMUNITY): Payer: Self-pay

## 2024-04-02 NOTE — Telephone Encounter (Signed)
 Nat called in regards to pt's cardiac rehab referral. I advised her that the pt has been cleared for cardiac rehab but we are waiting for his medicaid form to be sent back from Dr. Robie office. We sent the form over 3 times once on 7/15, 7/30, and on 8/13, with no success of getting the form back. I advised Nat that I sent the form again today. I provided Nat with their phone number to follow up.

## 2024-04-02 NOTE — Telephone Encounter (Addendum)
 Zachary [name], called stating cardiac rehab faxed over a form on 7/30 and again this morning that needs to be completed so he can start rehab.  They are just waiting on that form to be completed and faxed back to them.

## 2024-04-02 NOTE — Telephone Encounter (Signed)
 Spoke with Zachary [name] and stated that I had checked our [employer] system this morning and this afternoon and did not see the faxed form. Explained that Dr. Dorine nurse is not in the office today but that I would forward this information to her to review when she is back in the office. Zachary [name] verbalized understanding of plan and had no further questions at this time.

## 2024-04-04 ENCOUNTER — Telehealth (HOSPITAL_COMMUNITY): Payer: Self-pay | Admitting: *Deleted

## 2024-04-04 ENCOUNTER — Telehealth (HOSPITAL_COMMUNITY): Payer: Self-pay

## 2024-04-04 NOTE — Telephone Encounter (Signed)
 Patient called back to get scheduled in the Cardiac Rehab Program. Patient will come in for orientation on 9/09 and will attend the 10:15 exercise class.  Sent MyChart message.

## 2024-04-04 NOTE — Telephone Encounter (Signed)
 Attempted to call patient to schedule cardiac rehab- no answer, left message. Sent MyChart message.

## 2024-04-04 NOTE — Telephone Encounter (Signed)
 Pt insurance is active and benefits verified through [facility]. Co-pay $0, DED $0/$0 met, out of pocket $0/$0 met, co-insurance 0%. No pre-authorization required. 04/04/2024 @ 11:21am, spoke with Lawayne, REF# [id number].  TCR/ICR? TCR Visit(date of service)limitation? 36 sessions Can multiple codes be used on the same date of service/visit?(IF ITS A LIMIT) Yes  Is this a lifetime maximum or an annual maximum? Annual Has the member used any of these services to date? No Is there a time limit (weeks/months) on start of program and/or program completion? 6 months from event on 6/20

## 2024-04-04 NOTE — Telephone Encounter (Signed)
-----   Message from Wilbert Bihari sent at 04/04/2024  9:23 AM EDT ----- Regarding: RE: Cardiac Rehab He is clear for cardiac rehab based on the stress test with inability to achieve at least 5 METS ----- Message ----- From: Janit Geni CROME, RN Sent: 04/03/2024   4:32 PM EDT To: Wilbert JONELLE Bihari, MD; Saturnino KATHEE Koyanagi, RN Subject: Cardiac Rehab                                  Hello,  I think I may have confused Dr. Bihari so I wanted to double check on this patient.  Patient had exercise stress test 03/22/24 with these results: No ischemia but nondiagnostic study for ischemia due to inability to reach 85% maximum predicted heart rate.  He has moderately reduced functional capacity only being able to achieve 4.6 METS.  He therefore qualifies for cardiac rehab.  Please refer to cardiac rehab.  Natonya Finstad sent over a cardiac rehab form some time ago that she needed signed by Dr. Bihari so patient could start rehab, but we had some work to do to get him qualified. I gave Dr. Bihari a copy of the form today that I had held onto since July in case we needed it. However, I didn't realize there was old writing on the bottom of the form saying he needed additional testing. I probably should have crossed that out and I just want to make sure I didn't muddy the waters.   I will place on Dr. Dorine desk so she can review and let us  know if he does in fact qualify. Geni, RN

## 2024-04-21 NOTE — Progress Notes (Signed)
 Subjective:   There are no active problems to display for this patient.    Chief Complaint  Patient presents with  . Eye Problem    Pt c/o bilateral eye irritation, pain in left eye. No vision problems. Started 2 days ago. No otc meds     History of Present Illness This is a 53 year old male presenting with bilateral eye redness.  The patient suspects he may have pink eye. He reports irritation in his left eye that began on Friday morning, accompanied by mild irritation. Initially, the symptoms were present in his right eye but shifted to the left eye upon waking up on Saturday. He has not experienced any trauma to the eye or exposure to dust, grass, dirt, or glass. He has not noticed any discharge from his eyes or goopiness. He reports no shortness of breath, cough, runny nose, sneezing, or sore throat. He has no known allergies and has not taken any medication for his symptoms. He has not experienced any changes in vision, such as blurred or double vision, photosensitivity or loss of vision. He also reports no fever. He uses glasses for vision correction. He does not wear contacts.   Parts of patient history reviewed include PMH, problem list, medications, allergies, and social history.  Objective:   Vitals:   04/21/24 1606  BP: (!) 164/108  Pulse: 102  Resp: 18  Temp: 98.3 F (36.8 C)  TempSrc: Oral  SpO2: 100%  Weight: 79.4 kg (175 lb)    Physical Exam Vitals and nursing note reviewed.  Constitutional:      General: He is not in acute distress.    Appearance: Normal appearance. He is normal weight. He is not ill-appearing or toxic-appearing.  HENT:     Head: Normocephalic and atraumatic.     Right Ear: Tympanic membrane and ear canal normal.     Left Ear: Tympanic membrane and ear canal normal.     Nose: Nose normal. No congestion or rhinorrhea.     Mouth/Throat:     Mouth: Mucous membranes are moist.  Eyes:     General: Lids are normal. Lids are everted, no  foreign bodies appreciated. Vision grossly intact.        Right eye: No foreign body or discharge.        Left eye: No foreign body or discharge.     Extraocular Movements: Extraocular movements intact.     Conjunctiva/sclera:     Right eye: Right conjunctiva is injected. No exudate or hemorrhage.    Left eye: Left conjunctiva is injected. No exudate or hemorrhage.    Funduscopic exam:    Right eye: Red reflex present.        Left eye: Red reflex present. Pulmonary:     Effort: Pulmonary effort is normal.  Musculoskeletal:     Cervical back: Neck supple.  Skin:    General: Skin is warm.  Neurological:     Mental Status: He is alert and oriented to person, place, and time. Mental status is at baseline.      Assessment/Plan:   Assessment & Plan  Bilateral bacterial conjunctivitis  Patient presents with scleral injection consistent with bacterial conjunctivitis.  No recent eye trauma or suspected microtrauma (dust, sand, etc). PERRLA. EOM intact without pain. No periorbital swelling. No headache or significant photophobia, visual acuity grossly intact. Given history and exam I have low suspicion for globe rupture, acute angle glaucoma, cellulitis, uveitis, HSV keratitis, or Foreign Body.  Patient does not wears  contacts but does wear glasses, will treat with ofloxacin 1 to 2 drops 4 times daily for the next 7 days.  Supportive measures discussed.  Follow-up with ophthalmology if no improvement.  Red flags were to ED evaluation advised.  Home Care   Patient has been instructed on medications, dosages, side effects, and possible interactions as associated with each diagnosis in my impression and plan above. Patient education (verbal/handout) given on diagnosis, pathophysiology, treatment of diagnosis, side effects of medication use for treatment, restrictions while taking medication, and supportive measures.   Patient was instructed on when to follow up and know that they can follow up  here, with their PCP, Urgent Care, ED.  They have been instructed that if symptoms worsen that should return to the clinic, go to the nearest ED, or activate EMS. Red Flags associated with their diagnoses were reviewed and patient was educated on what to do if red.       Patient agreed with plan and voiced understanding.  No barriers to adherence perceived by myself.  Portions of this note may have been dictated using Dragon dictation software/hardware and may contain grammatical or spelling errors.   Electronically signed by:   Sotero Pore, DNP ENP-C FNP-C [facility]  04/21/2024 5:01 PM

## 2024-04-23 ENCOUNTER — Encounter (HOSPITAL_COMMUNITY)
Admission: RE | Admit: 2024-04-23 | Discharge: 2024-04-23 | Disposition: A | Discharge status: Home / Self Care | Source: Ambulatory Visit | Attending: Cardiology | Admitting: Cardiology

## 2024-04-23 ENCOUNTER — Ambulatory Visit: Payer: Self-pay | Admitting: Cardiology

## 2024-04-23 VITALS — BP 132/78 | HR 89 | Ht 70.0 in | Wt 173.7 lb

## 2024-04-23 DIAGNOSIS — Z48812 Encounter for surgical aftercare following surgery on the circulatory system: Secondary | ICD-10-CM | POA: Insufficient documentation

## 2024-04-23 DIAGNOSIS — Z951 Presence of aortocoronary bypass graft: Secondary | ICD-10-CM | POA: Insufficient documentation

## 2024-04-23 DIAGNOSIS — I25118 Atherosclerotic heart disease of native coronary artery with other forms of angina pectoris: Secondary | ICD-10-CM | POA: Insufficient documentation

## 2024-04-23 LAB — GLUCOSE, CAPILLARY: Glucose-Capillary: 136 mg/dL — ABNORMAL HIGH (ref 70–99)

## 2024-04-23 NOTE — Progress Notes (Signed)
 Cardiac Rehab Medication Review   Does the patient  feel that his/her medications are working for him/her?  yes  Has the patient been experiencing any side effects to the medications prescribed?  no  Does the patient measure his/her own blood pressure or blood glucose at home?  Yes, checks BP and CBG at least once daily  Does the patient have any problems obtaining medications due to transportation or finances?   no  Understanding of regimen: excellent Understanding of indications: excellent Potential of compliance: excellent    Comments: Medication list reviewed and up-to-date.    Zachary [name] 04/23/2024 9:50 AM

## 2024-04-23 NOTE — Progress Notes (Signed)
 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 Swallow whole., Disp: , Rfl:    atorvastatin  (LIPITOR ) 80 MG tablet, Take 1 tablet (80 mg total) by mouth daily., Disp: 90 tablet, Rfl: 3   blood glucose meter kit and supplies KIT, Dispense based on patient and insurance preference. Use up to four times daily as directed. (FOR ICD-9 250.00, 250.01)., Disp: 1 each, Rfl: 0   Blood Glucose Monitoring Suppl (ACCU-CHEK GUIDE ME) w/Device KIT, Use as directed 2 (two) times daily., Disp: 1 kit, Rfl: 0   carvedilol  (COREG ) 12.5 MG tablet, Take 1 tablet (12.5 mg total) by mouth 2 (two) times daily., Disp: 180 tablet, Rfl: 3   clopidogrel  (PLAVIX ) 75 MG tablet, Take 1 tablet (75 mg total) by mouth daily., Disp: 90 tablet, Rfl: 1   furosemide  (LASIX ) 20 MG tablet, TAKE 1 TABLET (20 MG TOTAL) BY MOUTH DAILY. AS DIRECTED, Disp: 90 tablet, Rfl: 0   glucose blood (TRUE METRIX BLOOD GLUCOSE TEST) test strip, Use as instructed. Check blood glucose level by fingerstick twice per day., Disp: 100 each, Rfl: 12   metFORMIN  (GLUCOPHAGE ) 500 MG tablet, Take 1 tablet (500 mg total) by  mouth 2 (two) times daily with a meal., Disp: 60 tablet, Rfl: 1   oxyCODONE  (OXY IR/ROXICODONE ) 5 MG immediate release tablet, Take 1 tablet (5 mg total) by mouth every 6 (six) hours as needed for severe pain (pain score 7-10). (Patient not taking: Reported on 04/23/2024), Disp: 28 tablet, Rfl: 0   potassium chloride  SA (KLOR-CON  M20) 20 MEQ tablet, Take 1 tablet (20 mEq total) by mouth daily. As directed (Patient not taking: Reported on 04/23/2024), Disp: 30 tablet, Rfl: 0  Past Medical History: Past Medical History:  Diagnosis Date   Diabetes mellitus without complication (HCC)    Hypertension    Stroke (HCC) 05/2017   Right [place] Stroke    Tobacco Use: Social History   Tobacco Use  Smoking Status Never  Smokeless Tobacco Never    Labs: Review Flowsheet  More data exists      Latest Ref Rng & Units 01/18/2021 08/03/2023 01/23/2024 01/24/2024 02/02/2024  Labs for ITP Cardiac and Pulmonary Rehab  Cholestrol 0 - 200 mg/dL 793  849  - 852  -  LDL (calc) 0 - 99 mg/dL 872  89  - 91  -  HDL-C >40 mg/dL 50  44  - 43  -  Trlycerides <150 mg/dL 834  87  - 66  -  Hemoglobin A1c 4.8 - 5.6 % 7.9  7.1  6.9  6.6  -  PH, Arterial 7.35 - 7.45 - - - - 7.344  7.339  7.483  7.400  7.389  7.410  7.350   PCO2 arterial 32 - 48 mmHg - - - - 35.1  32.1  27.1  38.1  38.2  33.5  41.5   Bicarbonate 20.0 - 28.0 mmol/L - - - - 19.0  17.3  20.6  23.6  23.1  22.3  21.2  22.9   TCO2 22 - 32 mmol/L - - - - 20  18  21  25  26  26  24  26  23  22  27  24  27    Acid-base deficit 0.0 - 2.0 mmol/L - - - - 6.0  8.0  3.0  1.0  2.0  2.0  3.0  3.0   O2 Saturation % - - - - 99  100  99  100  100  89  100  100     Details       Multiple values from one day are sorted in reverse-chronological order         Capillary Blood Glucose: Lab Results  Component Value Date   GLUCAP 136 (H) 04/23/2024   GLUCAP 101 (H) 02/06/2024   GLUCAP 124 (H) 02/05/2024   GLUCAP 105 (H) 02/05/2024   GLUCAP 139 (H) 02/05/2024      Exercise Target Goals: Exercise Program Goal: Individual exercise prescription set using results from initial 6 min walk test and THRR while considering  patient's activity barriers and safety.   Exercise Prescription Goal: Initial exercise prescription builds to 30-45 minutes a day of aerobic activity, 2-3 days per week.  Home exercise guidelines will be given to patient during program as part of exercise prescription that the participant will acknowledge.  Activity Barriers & Risk Stratification:  Activity Barriers & Cardiac Risk Stratification - 04/23/24 0932       Activity Barriers & Cardiac Risk Stratification   Activity Barriers Balance Concerns;Other (comment)    Comments Lack of feeling -left side from stroke    Cardiac Risk Stratification High          6 Minute Walk:  6 Minute Walk     Row Name 04/23/24 1026         6 Minute Walk   Phase Initial     Distance 989 feet     Walk Time 6 minutes     # of Rest Breaks 0     MPH 1.87     METS 3.48     RPE 10.5     Perceived Dyspnea  0     VO2 Peak 12.19     Symptoms No     Resting HR 89 bpm     Resting BP 132/78     Resting Oxygen Saturation  98 %     Exercise Oxygen Saturation  during 6 min walk 99 %     Max Ex. HR 104 bpm     Max Ex. BP 138/88     2 Minute Post BP 124/84        Oxygen Initial Assessment:   Oxygen Re-Evaluation:   Oxygen Discharge (Final Oxygen Re-Evaluation):   Initial Exercise Prescription:  Initial Exercise Prescription - 04/23/24 1500       Date of Initial Exercise RX and Referring Provider   Date 04/23/24    Referring Provider Shlomo Wilbert SAUNDERS, MD    Expected Discharge Date 07/15/24      NuStep   Level 1    SPM 75    Minutes 15  METs 2.5      Arm Ergometer   Level --    Watts --    RPM --    Minutes --    METs --      Track   Laps 11    Minutes 15    METs 2.4      Prescription Details   Frequency (times per week) 3    Duration Progress to 30 minutes  of continuous aerobic without signs/symptoms of physical distress      Intensity   THRR 40-80% of Max Heartrate 66-132    Ratings of Perceived Exertion 11-13    Perceived Dyspnea 0-4      Progression   Progression Continue to progress workloads to maintain intensity without signs/symptoms of physical distress.      Resistance Training   Training Prescription Yes    Weight 3 lbs    Reps 10-15          Perform Capillary Blood Glucose checks as needed.  Exercise Prescription Changes:   Exercise Comments:   Exercise Goals and Review:   Exercise Goals     Row Name 04/23/24 0917             Exercise Goals   Increase Physical Activity Yes       Intervention Provide advice, education, support and counseling about physical activity/exercise needs.;Develop an individualized exercise prescription for aerobic and resistive training based on initial evaluation findings, risk stratification, comorbidities and participant's personal goals.       Expected Outcomes Long Term: Exercising regularly at least 3-5 days a week.;Short Term: Attend rehab on a regular basis to increase amount of physical activity.;Long Term: Add in home exercise to make exercise part of routine and to increase amount of physical activity.       Increase Strength and Stamina Yes       Intervention Provide advice, education, support and counseling about physical activity/exercise needs.;Develop an individualized exercise prescription for aerobic and resistive training based on initial evaluation findings, risk stratification, comorbidities and participant's personal goals.       Expected Outcomes Short Term: Increase workloads from initial exercise prescription for resistance, speed, and METs.;Short Term: Perform resistance training exercises routinely during rehab and add in resistance training at home;Long Term: Improve cardiorespiratory fitness, muscular endurance and strength as measured by increased METs and  functional capacity ( )       Able to understand and use rate of perceived exertion (RPE) scale Yes       Intervention Provide education and explanation on how to use RPE scale       Expected Outcomes Short Term: Able to use RPE daily in rehab to express subjective intensity level;Long Term:  Able to use RPE to guide intensity level when exercising independently       Knowledge and understanding of Target Heart Rate Range (THRR) Yes       Intervention Provide education and explanation of THRR including how the numbers were predicted and where they are located for reference       Expected Outcomes Short Term: Able to state/look up THRR;Long Term: Able to use THRR to govern intensity when exercising independently;Short Term: Able to use daily as guideline for intensity in rehab       Able to check pulse independently Yes       Intervention Provide education and demonstration on how to check pulse in carotid and radial arteries.;Review the importance of being able to check your own pulse  for safety during independent exercise       Expected Outcomes Short Term: Able to explain why pulse checking is important during independent exercise;Long Term: Able to check pulse independently and accurately       Understanding of Exercise Prescription Yes       Intervention Provide education, explanation, and written materials on patient's individual exercise prescription       Expected Outcomes Short Term: Able to explain program exercise prescription;Long Term: Able to explain home exercise prescription to exercise independently          Exercise Goals Re-Evaluation :   Discharge Exercise Prescription (Final Exercise Prescription Changes):   Nutrition:  Target Goals: Understanding of nutrition guidelines, daily intake of sodium 1500mg , cholesterol 200mg , calories 30% from fat and 7% or less from saturated fats, daily to have 5 or more servings of fruits and vegetables.  Biometrics:  Pre Biometrics -  04/23/24 0820       Pre Biometrics   Waist Circumference 38.75 inches    Hip Circumference 40.75 inches    Waist to Hip Ratio 0.95 %    Triceps Skinfold 15 mm    % Body Fat 25.4 %    Grip Strength 21 kg    Flexibility 9.25 in    Single Leg Stand 7.37 seconds           Nutrition Therapy Plan and Nutrition Goals:   Nutrition Assessments:  MEDIFICTS Score Key: >=70 Need to make dietary changes  40-70 Heart Healthy Diet <= 40 Therapeutic Level Cholesterol Diet    Picture Your Plate Scores: <59 Unhealthy dietary pattern with much room for improvement. 41-50 Dietary pattern unlikely to meet recommendations for good health and room for improvement. 51-60 More healthful dietary pattern, with some room for improvement.  >60 Healthy dietary pattern, although there may be some specific behaviors that could be improved.    Nutrition Goals Re-Evaluation:   Nutrition Goals Re-Evaluation:   Nutrition Goals Discharge (Final Nutrition Goals Re-Evaluation):   Psychosocial: Target Goals: Acknowledge presence or absence of significant depression and/or stress, maximize coping skills, provide positive support system. Participant is able to verbalize types and ability to use techniques and skills needed for reducing stress and depression.  Initial Review & Psychosocial Screening:  Initial Psych Review & Screening - 04/23/24 0915       Initial Review   Current issues with None Identified      Family Dynamics   Good Support System? Yes    Comments Tu has support from his ex-wife, son, and daughter.      Screening Interventions   Interventions Encouraged to exercise;Provide feedback about the scores to participant    Expected Outcomes Short Term goal: Identification and review with participant of any Quality of Life or Depression concerns found by scoring the questionnaire.;Long Term goal: The participant improves quality of Life and PHQ9 Scores as seen by post scores and/or  verbalization of changes;Short Term goal: Utilizing psychosocial counselor, staff and physician to assist with identification of specific Stressors or current issues interfering with healing process. Setting desired goal for each stressor or current issue identified.;Long Term Goal: Stressors or current issues are controlled or eliminated.          Quality of Life Scores:  Quality of Life - 04/23/24 0939       Quality of Life   Select Quality of Life      Quality of Life Scores   Health/Function Pre 18.88 %    Socioeconomic  Pre 19.17 %    Psych/Spiritual Pre 17.75 %    Family Pre 27.88 %    GLOBAL Pre 19.95 %         Scores of 19 and below usually indicate a poorer quality of life in these areas.  A difference of  2-3 points is a clinically meaningful difference.  A difference of 2-3 points in the total score of the Quality of Life Index has been associated with significant improvement in overall quality of life, self-image, physical symptoms, and general health in studies assessing change in quality of life.  PHQ-9: Review Flowsheet  More data exists      04/23/2024 02/07/2024 08/03/2023 12/24/2020 04/30/2019  Depression screen PHQ 2/9  Decreased Interest 0 0 0 0 0  Down, Depressed, Hopeless 0 0 0 0 0  PHQ - 2 Score 0 0 0 0 0  Altered sleeping 0 - 0 0 0  Tired, decreased energy 0 - 0 0 0  Change in appetite 1 - 0 0 0  Feeling bad or failure about yourself  0 - 0 0 0  Trouble concentrating 0 - 0 0 0  Moving slowly or fidgety/restless 0 - 0 0 0  Suicidal thoughts 0 - 0 0 0  PHQ-9 Score 1 - 0 0 0  Difficult doing work/chores Not difficult at all - - - -   Interpretation of Total Score  Total Score Depression Severity:  1-4 = Minimal depression, 5-9 = Mild depression, 10-14 = Moderate depression, 15-19 = Moderately severe depression, 20-27 = Severe depression   Psychosocial Evaluation and Intervention:   Psychosocial Re-Evaluation:   Psychosocial Discharge (Final  Psychosocial Re-Evaluation):   Vocational Rehabilitation: Provide vocational rehab assistance to qualifying candidates.   Vocational Rehab Evaluation & Intervention:  Vocational Rehab - 04/23/24 0952       Initial Vocational Rehab Evaluation & Intervention   Assessment shows need for Vocational Rehabilitation Yes    Vocational Rehab Packet given to patient 04/23/24          Education: Education Goals: Education classes will be provided on a weekly basis, covering required topics. Participant will state understanding/return demonstration of topics presented.     Core Videos: Exercise    Move It!  Clinical staff conducted group or individual video education with verbal and written material and guidebook.  Patient learns the recommended Pritikin exercise program. Exercise with the goal of living a long, healthy life. Some of the health benefits of exercise include controlled diabetes, healthier blood pressure levels, improved cholesterol levels, improved heart and lung capacity, improved sleep, and better body composition. Everyone should speak with their doctor before starting or changing an exercise routine.  Biomechanical Limitations Clinical staff conducted group or individual video education with verbal and written material and guidebook.  Patient learns how biomechanical limitations can impact exercise and how we can mitigate and possibly overcome limitations to have an impactful and balanced exercise routine.  Body Composition Clinical staff conducted group or individual video education with verbal and written material and guidebook.  Patient learns that body composition (ratio of muscle mass to fat mass) is a key component to assessing overall fitness, rather than body weight alone. Increased fat mass, especially visceral belly fat, can put us  at increased risk for metabolic syndrome, type 2 diabetes, heart disease, and even death. It is recommended to combine diet and  exercise (cardiovascular and resistance training) to improve your body composition. Seek guidance from your physician and exercise physiologist before implementing  an exercise routine.  Exercise Action Plan Clinical staff conducted group or individual video education with verbal and written material and guidebook.  Patient learns the recommended strategies to achieve and enjoy long-term exercise adherence, including variety, self-motivation, self-efficacy, and positive decision making. Benefits of exercise include fitness, good health, weight management, more energy, better sleep, less stress, and overall well-being.  Medical   Heart Disease Risk Reduction Clinical staff conducted group or individual video education with verbal and written material and guidebook.  Patient learns our heart is our most vital organ as it circulates oxygen, nutrients, white blood cells, and hormones throughout the entire body, and carries waste away. Data supports a plant-based eating plan like the Pritikin Program for its effectiveness in slowing progression of and reversing heart disease. The video provides a number of recommendations to address heart disease.   Metabolic Syndrome and Belly Fat  Clinical staff conducted group or individual video education with verbal and written material and guidebook.  Patient learns what metabolic syndrome is, how it leads to heart disease, and how one can reverse it and keep it from coming back. You have metabolic syndrome if you have 3 of the following 5 criteria: abdominal obesity, high blood pressure, high triglycerides, low HDL cholesterol, and high blood sugar.  Hypertension and Heart Disease Clinical staff conducted group or individual video education with verbal and written material and guidebook.  Patient learns that high blood pressure, or hypertension, is very common in the [place] . Hypertension is largely due to excessive salt intake, but other important risk  factors include being overweight, physical inactivity, drinking too much alcohol, smoking, and not eating enough potassium from fruits and vegetables. High blood pressure is a leading risk factor for heart attack, stroke, congestive heart failure, dementia, kidney failure, and premature death. Long-term effects of excessive salt intake include stiffening of the arteries and thickening of heart muscle and organ damage. Recommendations include ways to reduce hypertension and the risk of heart disease.  Diseases of Our Time - Focusing on Diabetes Clinical staff conducted group or individual video education with verbal and written material and guidebook.  Patient learns why the best way to stop diseases of our time is prevention, through food and other lifestyle changes. Medicine (such as prescription pills and surgeries) is often only a Band-Aid on the problem, not a long-term solution. Most common diseases of our time include obesity, type 2 diabetes, hypertension, heart disease, and cancer. The Pritikin Program is recommended and has been proven to help reduce, reverse, and/or prevent the damaging effects of metabolic syndrome.  Nutrition   Overview of the Pritikin Eating Plan  Clinical staff conducted group or individual video education with verbal and written material and guidebook.  Patient learns about the Pritikin Eating Plan for disease risk reduction. The Pritikin Eating Plan emphasizes a wide variety of unrefined, minimally-processed carbohydrates, like fruits, vegetables, whole grains, and legumes. Go, Caution, and Stop food choices are explained. Plant-based and lean animal proteins are emphasized. Rationale provided for low sodium intake for blood pressure control, low added sugars for blood sugar stabilization, and low added fats and oils for coronary artery disease risk reduction and weight management.  Calorie Density  Clinical staff conducted group or individual video education with verbal  and written material and guidebook.  Patient learns about calorie density and how it impacts the Pritikin Eating Plan. Knowing the characteristics of the food you choose will help you decide whether those foods will lead to weight gain or  weight loss, and whether you want to consume more or less of them. Weight loss is usually a side effect of the Pritikin Eating Plan because of its focus on low calorie-dense foods.  Label Reading  Clinical staff conducted group or individual video education with verbal and written material and guidebook.  Patient learns about the Pritikin recommended label reading guidelines and corresponding recommendations regarding calorie density, added sugars, sodium content, and whole grains.  Dining Out - Part 1  Clinical staff conducted group or individual video education with verbal and written material and guidebook.  Patient learns that restaurant meals can be sabotaging because they can be so high in calories, fat, sodium, and/or sugar. Patient learns recommended strategies on how to positively address this and avoid unhealthy pitfalls.  Facts on Fats  Clinical staff conducted group or individual video education with verbal and written material and guidebook.  Patient learns that lifestyle modifications can be just as effective, if not more so, as many medications for lowering your risk of heart disease. A Pritikin lifestyle can help to reduce your risk of inflammation and atherosclerosis (cholesterol build-up, or plaque, in the artery walls). Lifestyle interventions such as dietary choices and physical activity address the cause of atherosclerosis. A review of the types of fats and their impact on blood cholesterol levels, along with dietary recommendations to reduce fat intake is also included.  Nutrition Action Plan  Clinical staff conducted group or individual video education with verbal and written material and guidebook.  Patient learns how to incorporate Pritikin  recommendations into their lifestyle. Recommendations include planning and keeping personal health goals in mind as an important part of their success.  Healthy Mind-Set    Healthy Minds, Bodies, Hearts  Clinical staff conducted group or individual video education with verbal and written material and guidebook.  Patient learns how to identify when they are stressed. Video will discuss the impact of that stress, as well as the many benefits of stress management. Patient will also be introduced to stress management techniques. The way we think, act, and feel has an impact on our hearts.  How Our Thoughts Can Heal Our Hearts  Clinical staff conducted group or individual video education with verbal and written material and guidebook.  Patient learns that negative thoughts can cause depression and anxiety. This can result in negative lifestyle behavior and serious health problems. Cognitive behavioral therapy is an effective method to help control our thoughts in order to change and improve our emotional outlook.  Additional Videos:  Exercise    Improving Performance  Clinical staff conducted group or individual video education with verbal and written material and guidebook.  Patient learns to use a non-linear approach by alternating intensity levels and lengths of time spent exercising to help burn more calories and lose more body fat. Cardiovascular exercise helps improve heart health, metabolism, hormonal balance, blood sugar control, and recovery from fatigue. Resistance training improves strength, endurance, balance, coordination, reaction time, metabolism, and muscle mass. Flexibility exercise improves circulation, posture, and balance. Seek guidance from your physician and exercise physiologist before implementing an exercise routine and learn your capabilities and proper form for all exercise.  Introduction to Yoga  Clinical staff conducted group or individual video education with verbal and  written material and guidebook.  Patient learns about yoga, a discipline of the coming together of mind, breath, and body. The benefits of yoga include improved flexibility, improved range of motion, better posture and core strength, increased lung function, weight loss, and  positive self-image. Yoga's heart health benefits include lowered blood pressure, healthier heart rate, decreased cholesterol and triglyceride levels, improved immune function, and reduced stress. Seek guidance from your physician and exercise physiologist before implementing an exercise routine and learn your capabilities and proper form for all exercise.  Medical   Aging: Enhancing Your Quality of Life  Clinical staff conducted group or individual video education with verbal and written material and guidebook.  Patient learns key strategies and recommendations to stay in good physical health and enhance quality of life, such as prevention strategies, having an advocate, securing a Health Care Proxy and Power of Attorney, and keeping a list of medications and system for tracking them. It also discusses how to avoid risk for bone loss.  Biology of Weight Control  Clinical staff conducted group or individual video education with verbal and written material and guidebook.  Patient learns that weight gain occurs because we consume more calories than we burn (eating more, moving less). Even if your body weight is normal, you may have higher ratios of fat compared to muscle mass. Too much body fat puts you at increased risk for cardiovascular disease, heart attack, stroke, type 2 diabetes, and obesity-related cancers. In addition to exercise, following the Pritikin Eating Plan can help reduce your risk.  Decoding Lab Results  Clinical staff conducted group or individual video education with verbal and written material and guidebook.  Patient learns that lab test reflects one measurement whose values change over time and are influenced  by many factors, including medication, stress, sleep, exercise, food, hydration, pre-existing medical conditions, and more. It is recommended to use the knowledge from this video to become more involved with your lab results and evaluate your numbers to speak with your doctor.   Diseases of Our Time - Overview  Clinical staff conducted group or individual video education with verbal and written material and guidebook.  Patient learns that according to the CDC, 50% to 70% of chronic diseases (such as obesity, type 2 diabetes, elevated lipids, hypertension, and heart disease) are avoidable through lifestyle improvements including healthier food choices, listening to satiety cues, and increased physical activity.  Sleep Disorders Clinical staff conducted group or individual video education with verbal and written material and guidebook.  Patient learns how good quality and duration of sleep are important to overall health and well-being. Patient also learns about sleep disorders and how they impact health along with recommendations to address them, including discussing with a physician.  Nutrition  Dining Out - Part 2 Clinical staff conducted group or individual video education with verbal and written material and guidebook.  Patient learns how to plan ahead and communicate in order to maximize their dining experience in a healthy and nutritious manner. Included are recommended food choices based on the type of restaurant the patient is visiting.   Fueling a Banker conducted group or individual video education with verbal and written material and guidebook.  There is a strong connection between our food choices and our health. Diseases like obesity and type 2 diabetes are very prevalent and are in large-part due to lifestyle choices. The Pritikin Eating Plan provides plenty of food and hunger-curbing satisfaction. It is easy to follow, affordable, and helps reduce health  risks.  Menu Workshop  Clinical staff conducted group or individual video education with verbal and written material and guidebook.  Patient learns that restaurant meals can sabotage health goals because they are often packed with calories, fat, sodium, and  sugar. Recommendations include strategies to plan ahead and to communicate with the manager, chef, or server to help order a healthier meal.  Planning Your Eating Strategy  Clinical staff conducted group or individual video education with verbal and written material and guidebook.  Patient learns about the Pritikin Eating Plan and its benefit of reducing the risk of disease. The Pritikin Eating Plan does not focus on calories. Instead, it emphasizes high-quality, nutrient-rich foods. By knowing the characteristics of the foods, we choose, we can determine their calorie density and make informed decisions.  Targeting Your Nutrition Priorities  Clinical staff conducted group or individual video education with verbal and written material and guidebook.  Patient learns that lifestyle habits have a tremendous impact on disease risk and progression. This video provides eating and physical activity recommendations based on your personal health goals, such as reducing LDL cholesterol, losing weight, preventing or controlling type 2 diabetes, and reducing high blood pressure.  Vitamins and Minerals  Clinical staff conducted group or individual video education with verbal and written material and guidebook.  Patient learns different ways to obtain key vitamins and minerals, including through a recommended healthy diet. It is important to discuss all supplements you take with your doctor.   Healthy Mind-Set    Smoking Cessation  Clinical staff conducted group or individual video education with verbal and written material and guidebook.  Patient learns that cigarette smoking and tobacco addiction pose a serious health risk which affects millions of  people. Stopping smoking will significantly reduce the risk of heart disease, lung disease, and many forms of cancer. Recommended strategies for quitting are covered, including working with your doctor to develop a successful plan.  Culinary   Becoming a Set designer conducted group or individual video education with verbal and written material and guidebook.  Patient learns that cooking at home can be healthy, cost-effective, quick, and puts them in control. Keys to cooking healthy recipes will include looking at your recipe, assessing your equipment needs, planning ahead, making it simple, choosing cost-effective seasonal ingredients, and limiting the use of added fats, salts, and sugars.  Cooking - Breakfast and Snacks  Clinical staff conducted group or individual video education with verbal and written material and guidebook.  Patient learns how important breakfast is to satiety and nutrition through the entire day. Recommendations include key foods to eat during breakfast to help stabilize blood sugar levels and to prevent overeating at meals later in the day. Planning ahead is also a key component.  Cooking - Educational psychologist conducted group or individual video education with verbal and written material and guidebook.  Patient learns eating strategies to improve overall health, including an approach to cook more at home. Recommendations include thinking of animal protein as a side on your plate rather than center stage and focusing instead on lower calorie dense options like vegetables, fruits, whole grains, and plant-based proteins, such as beans. Making sauces in large quantities to freeze for later and leaving the skin on your vegetables are also recommended to maximize your experience.  Cooking - Healthy Salads and Dressing Clinical staff conducted group or individual video education with verbal and written material and guidebook.  Patient learns that  vegetables, fruits, whole grains, and legumes are the foundations of the Pritikin Eating Plan. Recommendations include how to incorporate each of these in flavorful and healthy salads, and how to create homemade salad dressings. Proper handling of ingredients is also covered. Cooking - Soups  and Desserts  Cooking - Soups and Desserts Clinical staff conducted group or individual video education with verbal and written material and guidebook.  Patient learns that Pritikin soups and desserts make for easy, nutritious, and delicious snacks and meal components that are low in sodium, fat, sugar, and calorie density, while high in vitamins, minerals, and filling fiber. Recommendations include simple and healthy ideas for soups and desserts.   Overview     The Pritikin Solution Program Overview Clinical staff conducted group or individual video education with verbal and written material and guidebook.  Patient learns that the results of the Pritikin Program have been documented in more than 100 articles published in peer-reviewed journals, and the benefits include reducing risk factors for (and, in some cases, even reversing) high cholesterol, high blood pressure, type 2 diabetes, obesity, and more! An overview of the three key pillars of the Pritikin Program will be covered: eating well, doing regular exercise, and having a healthy mind-set.  WORKSHOPS  Exercise: Exercise Basics: Building Your Action Plan Clinical staff led group instruction and group discussion with PowerPoint presentation and patient guidebook. To enhance the learning environment the use of posters, models and videos may be added. At the conclusion of this workshop, patients will comprehend the difference between physical activity and exercise, as well as the benefits of incorporating both, into their routine. Patients will understand the FITT (Frequency, Intensity, Time, and Type) principle and how to use it to build an exercise action  plan. In addition, safety concerns and other considerations for exercise and cardiac rehab will be addressed by the presenter. The purpose of this lesson is to promote a comprehensive and effective weekly exercise routine in order to improve patients' overall level of fitness.   Managing Heart Disease: Your Path to a Healthier Heart Clinical staff led group instruction and group discussion with PowerPoint presentation and patient guidebook. To enhance the learning environment the use of posters, models and videos may be added.At the conclusion of this workshop, patients will understand the anatomy and physiology of the heart. Additionally, they will understand how Pritikin's three pillars impact the risk factors, the progression, and the management of heart disease.  The purpose of this lesson is to provide a high-level overview of the heart, heart disease, and how the Pritikin lifestyle positively impacts risk factors.  Exercise Biomechanics Clinical staff led group instruction and group discussion with PowerPoint presentation and patient guidebook. To enhance the learning environment the use of posters, models and videos may be added. Patients will learn how the structural parts of their bodies function and how these functions impact their daily activities, movement, and exercise. Patients will learn how to promote a neutral spine, learn how to manage pain, and identify ways to improve their physical movement in order to promote healthy living. The purpose of this lesson is to expose patients to common physical limitations that impact physical activity. Participants will learn practical ways to adapt and manage aches and pains, and to minimize their effect on regular exercise. Patients will learn how to maintain good posture while sitting, walking, and lifting.  Balance Training and Fall Prevention  Clinical staff led group instruction and group discussion with PowerPoint presentation and  patient guidebook. To enhance the learning environment the use of posters, models and videos may be added. At the conclusion of this workshop, patients will understand the importance of their sensorimotor skills (vision, proprioception, and the vestibular system) in maintaining their ability to balance as they age. Patients will  apply a variety of balancing exercises that are appropriate for their current level of function. Patients will understand the common causes for poor balance, possible solutions to these problems, and ways to modify their physical environment in order to minimize their fall risk. The purpose of this lesson is to teach patients about the importance of maintaining balance as they age and ways to minimize their risk of falling.  WORKSHOPS   Nutrition:  Fueling a Ship broker led group instruction and group discussion with PowerPoint presentation and patient guidebook. To enhance the learning environment the use of posters, models and videos may be added. Patients will review the foundational principles of the Pritikin Eating Plan and understand what constitutes a serving size in each of the food groups. Patients will also learn Pritikin-friendly foods that are better choices when away from home and review make-ahead meal and snack options. Calorie density will be reviewed and applied to three nutrition priorities: weight maintenance, weight loss, and weight gain. The purpose of this lesson is to reinforce (in a group setting) the key concepts around what patients are recommended to eat and how to apply these guidelines when away from home by planning and selecting Pritikin-friendly options. Patients will understand how calorie density may be adjusted for different weight management goals.  Mindful Eating  Clinical staff led group instruction and group discussion with PowerPoint presentation and patient guidebook. To enhance the learning environment the use of posters,  models and videos may be added. Patients will briefly review the concepts of the Pritikin Eating Plan and the importance of low-calorie dense foods. The concept of mindful eating will be introduced as well as the importance of paying attention to internal hunger signals. Triggers for non-hunger eating and techniques for dealing with triggers will be explored. The purpose of this lesson is to provide patients with the opportunity to review the basic principles of the Pritikin Eating Plan, discuss the value of eating mindfully and how to measure internal cues of hunger and fullness using the Hunger Scale. Patients will also discuss reasons for non-hunger eating and learn strategies to use for controlling emotional eating.  Targeting Your Nutrition Priorities Clinical staff led group instruction and group discussion with PowerPoint presentation and patient guidebook. To enhance the learning environment the use of posters, models and videos may be added. Patients will learn how to determine their genetic susceptibility to disease by reviewing their family history. Patients will gain insight into the importance of diet as part of an overall healthy lifestyle in mitigating the impact of genetics and other environmental insults. The purpose of this lesson is to provide patients with the opportunity to assess their personal nutrition priorities by looking at their family history, their own health history and current risk factors. Patients will also be able to discuss ways of prioritizing and modifying the Pritikin Eating Plan for their highest risk areas  Menu  Clinical staff led group instruction and group discussion with PowerPoint presentation and patient guidebook. To enhance the learning environment the use of posters, models and videos may be added. Using menus brought in from [employer], or printed from [employer], patients will apply the Pritikin dining out guidelines that were presented in the  [employer] video. Patients will also be able to practice these guidelines in a variety of provided scenarios. The purpose of this lesson is to provide patients with the opportunity to practice hands-on learning of the Pritikin Dining Out guidelines with actual menus and  practice scenarios.  Label Reading Clinical staff led group instruction and group discussion with PowerPoint presentation and patient guidebook. To enhance the learning environment the use of posters, models and videos may be added. Patients will review and discuss the Pritikin label reading guidelines presented in Pritikin's Label Reading Educational series video. Using fool labels brought in from local grocery stores and markets, patients will apply the label reading guidelines and determine if the packaged food meet the Pritikin guidelines. The purpose of this lesson is to provide patients with the opportunity to review, discuss, and practice hands-on learning of the Pritikin Label Reading guidelines with actual packaged food labels. Cooking School  Pritikin's [employer] are designed to teach patients ways to prepare quick, simple, and affordable recipes at home. The importance of nutrition's role in chronic disease risk reduction is reflected in its emphasis in the overall Pritikin program. By learning how to prepare essential core Pritikin Eating Plan recipes, patients will increase control over what they eat; be able to customize the flavor of foods without the use of added salt, sugar, or fat; and improve the quality of the food they consume. By learning a set of core recipes which are easily assembled, quickly prepared, and affordable, patients are more likely to prepare more healthy foods at home. These workshops focus on convenient breakfasts, simple entres, side dishes, and desserts which can be prepared with minimal effort and are consistent with nutrition recommendations for cardiovascular risk  reduction. Cooking [employer] are taught by a Armed forces logistics/support/administrative officer (RD) who has been trained by the [employer]. The chef or RD has a clear understanding of the importance of minimizing - if not completely eliminating - added fat, sugar, and sodium in recipes. Throughout the series of Cooking School Workshop sessions, patients will learn about healthy ingredients and efficient methods of cooking to build confidence in their capability to prepare    Cooking School weekly topics:  Adding Flavor- Sodium-Free  Fast and Healthy Breakfasts  Powerhouse Plant-Based Proteins  Satisfying Salads and Dressings  Simple Sides and Sauces  International Cuisine-Spotlight on the [employer] Zones  Delicious Desserts  Savory Soups  [employer] - Meals in a Astronomer Appetizers and Snacks  Comforting Weekend Breakfasts  One-Pot Wonders   Fast Evening Meals  Landscape architect Your Pritikin Plate  WORKSHOPS   Healthy Mindset (Psychosocial):  Focused Goals, Sustainable Changes Clinical staff led group instruction and group discussion with PowerPoint presentation and patient guidebook. To enhance the learning environment the use of posters, models and videos may be added. Patients will be able to apply effective goal setting strategies to establish at least one personal goal, and then take consistent, meaningful action toward that goal. They will learn to identify common barriers to achieving personal goals and develop strategies to overcome them. Patients will also gain an understanding of how our mind-set can impact our ability to achieve goals and the importance of cultivating a positive and growth-oriented mind-set. The purpose of this lesson is to provide patients with a deeper understanding of how to set and achieve personal goals, as well as the tools and strategies needed to overcome common obstacles which may arise along the way.  From Head to Heart: The  Power of a Healthy Outlook  Clinical staff led group instruction and group discussion with PowerPoint presentation and patient guidebook. To enhance the learning environment the use of posters, models and videos may be added. Patients will be able  to recognize and describe the impact of emotions and mood on physical health. They will discover the importance of self-care and explore self-care practices which may work for them. Patients will also learn how to utilize the 4 C's to cultivate a healthier outlook and better manage stress and challenges. The purpose of this lesson is to demonstrate to patients how a healthy outlook is an essential part of maintaining good health, especially as they continue their cardiac rehab journey.  Healthy Sleep for a Healthy Heart Clinical staff led group instruction and group discussion with PowerPoint presentation and patient guidebook. To enhance the learning environment the use of posters, models and videos may be added. At the conclusion of this workshop, patients will be able to demonstrate knowledge of the importance of sleep to overall health, well-being, and quality of life. They will understand the symptoms of, and treatments for, common sleep disorders. Patients will also be able to identify daytime and nighttime behaviors which impact sleep, and they will be able to apply these tools to help manage sleep-related challenges. The purpose of this lesson is to provide patients with a general overview of sleep and outline the importance of quality sleep. Patients will learn about a few of the most common sleep disorders. Patients will also be introduced to the concept of "sleep hygiene," and discover ways to self-manage certain sleeping problems through simple daily behavior changes. Finally, the workshop will motivate patients by clarifying the links between quality sleep and their goals of heart-healthy living.   Recognizing and Reducing Stress Clinical staff led  group instruction and group discussion with PowerPoint presentation and patient guidebook. To enhance the learning environment the use of posters, models and videos may be added. At the conclusion of this workshop, patients will be able to understand the types of stress reactions, differentiate between acute and chronic stress, and recognize the impact that chronic stress has on their health. They will also be able to apply different coping mechanisms, such as reframing negative self-talk. Patients will have the opportunity to practice a variety of stress management techniques, such as deep abdominal breathing, progressive muscle relaxation, and/or guided imagery.  The purpose of this lesson is to educate patients on the role of stress in their lives and to provide healthy techniques for coping with it.  Learning Barriers/Preferences:  Learning Barriers/Preferences - 04/23/24 0951       Learning Barriers/Preferences   Learning Barriers None    Learning Preferences Verbal Instruction          Education Topics:  Knowledge Questionnaire Score:  Knowledge Questionnaire Score - 04/23/24 1559       Knowledge Questionnaire Score   Pre Score 16/24          Core Components/Risk Factors/Patient Goals at Admission:  Personal Goals and Risk Factors at Admission - 04/23/24 0916       Core Components/Risk Factors/Patient Goals on Admission   Diabetes Yes    Intervention Provide education about signs/symptoms and action to take for hypo/hyperglycemia.;Provide education about proper nutrition, including hydration, and aerobic/resistive exercise prescription along with prescribed medications to achieve blood glucose in normal ranges: Fasting glucose 65-99 mg/dL    Expected Outcomes Short Term: Participant verbalizes understanding of the signs/symptoms and immediate care of hyper/hypoglycemia, proper foot care and importance of medication, aerobic/resistive exercise and nutrition plan for blood  glucose control.;Long Term: Attainment of HbA1C < 7%.    Hypertension Yes    Intervention Provide education on lifestyle modifcations including regular physical activity/exercise,  weight management, moderate sodium restriction and increased consumption of fresh fruit, vegetables, and low fat dairy, alcohol moderation, and smoking cessation.;Monitor prescription use compliance.    Expected Outcomes Short Term: Continued assessment and intervention until BP is < 140/49mm HG in hypertensive participants. < 130/36mm HG in hypertensive participants with diabetes, heart failure or chronic kidney disease.;Long Term: Maintenance of blood pressure at goal levels.    Lipids Yes    Intervention Provide education and support for participant on nutrition & aerobic/resistive exercise along with prescribed medications to achieve LDL 70mg , HDL >40mg .    Expected Outcomes Short Term: Participant states understanding of desired cholesterol values and is compliant with medications prescribed. Participant is following exercise prescription and nutrition guidelines.;Long Term: Cholesterol controlled with medications as prescribed, with individualized exercise RX and with personalized nutrition plan. Value goals: LDL < 70mg , HDL > 40 mg.    Personal Goal Other Yes    Personal Goal Improve mobility/ walking. Be able to go back to work.    Intervention Develop an exercise action plan that Lanell can do to help improve mobility, increase walking ability. Vocational Rehab information provided.    Expected Outcomes Markees will be able to get around comfortably with better mobility as measured by self-report and 6-minute walk test.          Core Components/Risk Factors/Patient Goals Review:    Core Components/Risk Factors/Patient Goals at Discharge (Final Review):    ITP Comments:  ITP Comments     Row Name 04/23/24 0820           ITP Comments Medical Director- Dr Wilbert Bihari, MD. Introduction to the Cardiac  Rehab Program. Initial orientation folder reviewed with [facility].          Comments: Tyron attended orientation for the cardiac rehabilitation program on  04/23/2024  to perform initial intake and exercise walk test. She was introduced to the [employer] education and orientation packet was reviewed. Completed 6-minute walk test, measurements, initial ITP, and exercise prescription. Vital signs stable. Telemetry-normal sinus rhythm, singular PVC, asymptomatic.   Service time was from 820 to 1039. Arnoldo CHRISTELLA Gal, MS, ACSM CEP 04/23/2024 [phone]

## 2024-04-29 ENCOUNTER — Ambulatory Visit: Attending: Nurse Practitioner | Admitting: Nurse Practitioner

## 2024-04-29 ENCOUNTER — Encounter (HOSPITAL_COMMUNITY)
Admission: RE | Admit: 2024-04-29 | Discharge: 2024-04-29 | Disposition: A | Discharge status: Home / Self Care | Source: Ambulatory Visit | Attending: Cardiology | Admitting: Cardiology

## 2024-04-29 ENCOUNTER — Other Ambulatory Visit: Payer: Self-pay

## 2024-04-29 VITALS — BP 113/78 | HR 87 | Resp 18 | Ht 70.0 in | Wt 170.0 lb

## 2024-04-29 DIAGNOSIS — Z7982 Long term (current) use of aspirin: Secondary | ICD-10-CM

## 2024-04-29 DIAGNOSIS — Z951 Presence of aortocoronary bypass graft: Secondary | ICD-10-CM | POA: Diagnosis present

## 2024-04-29 DIAGNOSIS — I10 Essential (primary) hypertension: Secondary | ICD-10-CM

## 2024-04-29 DIAGNOSIS — Z7984 Long term (current) use of oral hypoglycemic drugs: Secondary | ICD-10-CM

## 2024-04-29 DIAGNOSIS — E119 Type 2 diabetes mellitus without complications: Secondary | ICD-10-CM | POA: Diagnosis not present

## 2024-04-29 DIAGNOSIS — R350 Frequency of micturition: Secondary | ICD-10-CM

## 2024-04-29 DIAGNOSIS — Z79899 Other long term (current) drug therapy: Secondary | ICD-10-CM

## 2024-04-29 DIAGNOSIS — Z23 Encounter for immunization: Secondary | ICD-10-CM | POA: Diagnosis not present

## 2024-04-29 DIAGNOSIS — I25118 Atherosclerotic heart disease of native coronary artery with other forms of angina pectoris: Secondary | ICD-10-CM | POA: Diagnosis not present

## 2024-04-29 DIAGNOSIS — Z09 Encounter for follow-up examination after completed treatment for conditions other than malignant neoplasm: Secondary | ICD-10-CM | POA: Diagnosis not present

## 2024-04-29 DIAGNOSIS — Z48812 Encounter for surgical aftercare following surgery on the circulatory system: Secondary | ICD-10-CM | POA: Diagnosis not present

## 2024-04-29 LAB — GLUCOSE, CAPILLARY
Glucose-Capillary: 120 mg/dL — ABNORMAL HIGH (ref 70–99)
Glucose-Capillary: 147 mg/dL — ABNORMAL HIGH (ref 70–99)

## 2024-04-29 MED ORDER — METFORMIN HCL 500 MG PO TABS
500.0000 mg | ORAL_TABLET | Freq: Two times a day (BID) | ORAL | 1 refills | Status: AC
  Filled 2024-04-29: qty 180, 90d supply, fill #0

## 2024-04-29 NOTE — Progress Notes (Unsigned)
 Assessment & Plan:  Zachary [name] was seen today for medical management of chronic issues.  Diagnoses and all orders for this visit:  Primary hypertension -     CMP14+EGFR Managed in coordination with cardiology. - Adjust blood pressure medications as needed in collaboration with cardiology.   Need for influenza vaccination -     Flu vaccine trivalent PF, 6mos and older(Flulaval,Afluria,Fluarix,Fluzone)  Frequency of urination -     PSA  Diabetes mellitus treated with oral medication  A1c not at goal Added farxiga  -     metFORMIN  (GLUCOPHAGE ) 500 MG tablet; Take 1 tablet (500 mg total) by mouth 2 (two) times daily with a meal. -     Hemoglobin A1c Managed with metformin . Blood sugar levels stable. - Refill metformin  for 6 months. - Check A1c at next visit.   Need for vaccination against Streptococcus pneumoniae -     Pneumococcal conjugate vaccine 20-valent (PCV20)  HFU Heart disease, post-cardiothoracic surgery Pain at incision site well-managed. Cardiac rehabilitation initiated.    General Health Maintenance Due for flu and pneumonia vaccines. Prostate cancer screening needed. - Administer flu vaccine. - Administer pneumonia vaccine. - Order PSA test. - Discuss shingles vaccine at next visit.  Home care needs Lives independently with family support nearby. Medicaid covers personal care hours if needed. - Discuss personal care hours if assistance is needed.  Patient has been counseled on age-appropriate routine health concerns for screening and prevention. These are reviewed and up-to-date. Referrals have been placed accordingly. Immunizations are up-to-date or declined.    Subjective:   Chief Complaint  Patient presents with   Medical Management of Chronic Issues   History of Present Illness   He has a past medical history of type 2 diabetes, hyperlipidemia, hypertension, and right/left pontine stroke.  CORONARY ARTERY BYPASS GRAFTING X 4, USING LEFT INTERNAL  MAMMARY ARTERY AND ENDOSCOPIC HARVESTED RIGHT GREATER SAPHENOUS VEIN  on 02/02/2024    Zachary [name] is a 53 year old male who presents for HTN and HFU  PER RECENT CARDIOLOGY OFFICE VISIT. Zachary [name] presented to the ED on 01/23/2024 with complaint of eating issues complicated regurgitation following meals. He had an extensive GI workup which was unrevealing.  He also endorsed exertional fatigue and high sensitivity troponins were 126 and 125.  2D echo was completed showing EF of 55 to 60% with mild LVH and grade 1 DD with small inferior apical wall motion abnormality.  He completed a coronary CTA to rule out CAD that showed calcium  score of 1442 with 70-99% mLAD/D1/OM1/ostial ramus with 50-70% AR.LHC was recommended that showed severe three-vessel CAD including CTO's of the mid LAD and distal RCA as well as severe stenosis of the D1 and OM.  CVTS was consulted with recommendation to undergo CABG after Plavix  washout.  He was discharged on 01/28/2024 underwent scheduled bypass on 02/02/2024.He underwent a CABG x 4 utilizing LIMA to LAD, SVG to acute marginal, SVG to OM, and SVG to Diagonal as well as endoscopic harvest of the right greater saphenous vein. His radial artery was not felt to be sufficient for arterial graft so it was not harvested. He tolerated the procedure well and had Plavix  restarted on 02/05/2024 and was discharged on 02/06/2024.  He is scheduled to follow-up with CVTS on 02/27/2024.  He was restarted on Plavix  on June 23rd and discharged on June 24th. He has been experiencing tolerable pain, no longer taking oxycodone .  No swelling has been noted since the procedure, and his blood  pressure and heart rate are stable. He has been using a spirometer regularly to prevent pneumonia and reports no issues with fast heartbeats or swelling. He is currently on Lasix , which was prescribed for five days post-surgery, and he has not experienced any swelling since then. Regarding his gastrointestinal issues,  the regurgitation has fully stopped post-surgery. He was not on Prilosec prior to the surgery. He has a history of visiting a gastroenterologist, where no significant issues were found. He is gradually returning to normal activities, including walking inside the house. His appetite is improving, and he is focusing on weight loss. Patient denies chest pain, palpitations, dyspnea, PND, orthopnea, nausea, vomiting, dizziness, syncope, edema, weight gain, or early satiety.     He recently started cardiac rehabilitation and reports today that it is going well.  He is managing pain from a thoracic incision without issues. He lives independently in his own apartment, with his ex-wife and children living nearby. He checks on him regularly.  Blood pressure at goal with amlodipine  10mg  daily, carvedilol  12.5 mg twice daily and furosemide  20 mg daily. BP Readings from Last 3 Encounters:  04/29/24 113/78  04/23/24 132/78  02/27/24 123/82    A1c not quite at goal.  He is currently taking metformin  500 mg twice daily. [id number]`21`21`1````[id number]`176 Lab Results  Component Value Date   HGBA1C 7.2 (H) 04/29/2024    Review of Systems  Constitutional:  Negative for fever, malaise/fatigue and weight loss.  HENT: Negative.  Negative for nosebleeds.   Eyes: Negative.  Negative for blurred vision, double vision and photophobia.  Respiratory: Negative.  Negative for cough and shortness of breath.   Cardiovascular: Negative.  Negative for chest pain, palpitations and leg swelling.  Gastrointestinal: Negative.  Negative for heartburn, nausea and vomiting.  Genitourinary:  Positive for frequency. Negative for dysuria, flank pain, hematuria and urgency.  Musculoskeletal: Negative.  Negative for myalgias.  Neurological: Negative.  Negative for dizziness, focal weakness, seizures and headaches.  Psychiatric/Behavioral: Negative.   Negative for suicidal ideas.     Past Medical History:  Diagnosis Date   Diabetes mellitus without complication (HCC)    Hypertension    Stroke (HCC) 05/2017   Right Pontine Stroke    Past Surgical History:  Procedure Laterality Date   CORONARY ARTERY BYPASS GRAFT N/A 02/02/2024   Procedure: CORONARY ARTERY BYPASS GRAFTING X 4, USING LEFT INTERNAL MAMMARY ARTERY AND ENDOSCOPIC HARVESTED RIGHT GREATER SAPHENOUS VEIN;  Surgeon: Zachary Elspeth BROCKS, MD;  Location: [facility] OR;  Service: Open Heart Surgery;  Laterality: N/A;   INTRAOPERATIVE TRANSESOPHAGEAL ECHOCARDIOGRAM N/A 02/02/2024   Procedure: ECHOCARDIOGRAM, TRANSESOPHAGEAL, INTRAOPERATIVE;  Surgeon: Zachary Elspeth BROCKS, MD;  Location: [facility];  Service: Open Heart Surgery;  Laterality: N/A;   LEFT HEART CATH AND CORONARY ANGIOGRAPHY N/A 01/25/2024   Procedure: LEFT HEART CATH AND CORONARY ANGIOGRAPHY;  Surgeon: Mady Bruckner, MD;  Location: [facility] INVASIVE CV LAB;  Service: Cardiovascular;  Laterality: N/A;    Family History  Problem Relation Age of Onset   Hypertension Mother     Social History Reviewed with no changes to be made today.   Outpatient Medications Prior to Visit  Medication Sig Dispense Refill   Accu-Chek Softclix Lancets lancets Check glucose bu fingerstick twice a day. 100 each 3   amLODipine  (NORVASC ) 10 MG tablet Take 1 tablet (10 mg total) by mouth daily. 90 tablet 1   aspirin  EC 81 MG tablet Take 1 tablet (81 mg total) by mouth daily. Swallow whole.     atorvastatin  (  LIPITOR ) 80 MG tablet Take 1 tablet (80 mg total) by mouth daily. 90 tablet 3   blood glucose meter kit and supplies KIT Dispense based on patient and insurance preference. Use up to four times daily as directed. (FOR ICD-9 250.00, 250.01). 1 each 0   Blood Glucose Monitoring Suppl (ACCU-CHEK GUIDE ME) w/Device KIT Use as directed 2 (two) times daily. 1 kit 0   carvedilol  (COREG ) 12.5 MG tablet Take 1 tablet (12.5 mg total) by mouth 2 (two) times  daily. 180 tablet 3   clopidogrel  (PLAVIX ) 75 MG tablet Take 1 tablet (75 mg total) by mouth daily. 90 tablet 1   furosemide  (LASIX ) 20 MG tablet TAKE 1 TABLET (20 MG TOTAL) BY MOUTH DAILY. AS DIRECTED 90 tablet 0   glucose blood (TRUE METRIX BLOOD GLUCOSE TEST) test strip Use as instructed. Check blood glucose level by fingerstick twice per day. 100 each 12   metFORMIN  (GLUCOPHAGE ) 500 MG tablet Take 1 tablet (500 mg total) by mouth 2 (two) times daily with a meal. 60 tablet 1   potassium chloride  SA (KLOR-CON  M20) 20 MEQ tablet Take 1 tablet (20 mEq total) by mouth daily. As directed (Patient not taking: Reported on 04/29/2024) 30 tablet 0   oxyCODONE  (OXY IR/ROXICODONE ) 5 MG immediate release tablet Take 1 tablet (5 mg total) by mouth every 6 (six) hours as needed for severe pain (pain score 7-10). (Patient not taking: Reported on 04/29/2024) 28 tablet 0   No facility-administered medications prior to visit.    No Known Allergies     Objective:    BP 113/78 (BP Location: Left Arm, Patient Position: Sitting, Cuff Size: Normal)   Pulse 87   Resp 18   Ht 5' 10 (1.778 m)   Wt 170 lb (77.1 kg)   SpO2 97%   BMI 24.39 kg/m  Wt Readings from Last 3 Encounters:  04/29/24 170 lb (77.1 kg)  04/23/24 173 lb 11.6 oz (78.8 kg)  02/27/24 171 lb (77.6 kg)    Physical Exam Vitals and nursing note reviewed.  Constitutional:      Appearance: He is well-developed.  HENT:     Head: Normocephalic and atraumatic.  Cardiovascular:     Rate and Rhythm: Normal rate and regular rhythm.     Heart sounds: Normal heart sounds. No murmur heard.    No friction rub. No gallop.  Pulmonary:     Effort: Pulmonary effort is normal. No tachypnea or respiratory distress.     Breath sounds: Normal breath sounds. No decreased breath sounds, wheezing, rhonchi or rales.  Chest:     Chest wall: No tenderness.  Musculoskeletal:        General: Normal range of motion.     Cervical back: Normal range of motion.   Skin:    General: Skin is warm and dry.  Neurological:     Mental Status: He is alert and oriented to person, place, and time.     Coordination: Coordination normal.  Psychiatric:        Behavior: Behavior normal. Behavior is cooperative.        Thought Content: Thought content normal.        Judgment: Judgment normal.          Patient has been counseled extensively about nutrition and exercise as well as the importance of adherence with medications and regular follow-up. The patient was given clear instructions to go to ER or return to medical center if symptoms don't improve, worsen or  new problems develop. The patient verbalized understanding.   Follow-up: Return in about 16 weeks (around 08/19/2024).   Haze LELON Servant, FNP-BC [facility] [place], [place] [phone]   04/30/2024, 9:17 PM

## 2024-04-29 NOTE — Progress Notes (Signed)
 Daily Session Note  Patient Details  Name: Zachary [name] MRN: [id number] Date of Birth: [birth year] Referring Provider:   Flowsheet Row CARDIAC REHAB PHASE II ORIENTATION from 04/23/2024 in [facility]  Referring Provider Shlomo Wilbert SAUNDERS, MD    Encounter Date: 04/29/2024  Check In:  Session Check In - 04/29/24 1044       Check-In   Supervising physician immediately available to respond to emergencies [facility] MD immediately available    Physician(s) Rosabel Mose NP    Location [facility]    Staff Present Arnoldo Gal, MS, ACSM-CEP, Exercise Physiologist;Jetta Vannie BS, ACSM-CEP, Exercise Physiologist;Aries Townley, RN, Valere Music, RN, Mallory Parkins, MS, ACSM-CEP, CCRP, Exercise Physiologist;Johnny Fayette, MS, Exercise Physiologist    Virtual Visit No    Medication changes reported     No    Fall or balance concerns reported    No    Tobacco Cessation No Change    Warm-up and Cool-down Performed as group-led instruction   Cardiac Rehab Orientation   Resistance Training Performed Yes    VAD Patient? No    PAD/SET Patient? No      Pain Assessment   Currently in Pain? No/denies    Pain Score 0-No pain    Multiple Pain Sites No          Capillary Blood Glucose: Results for orders placed or performed during the hospital encounter of 04/29/24 (from the past 24 hours)  Glucose, capillary     Status: Abnormal   Collection Time: 04/29/24 10:37 AM  Result Value Ref Range   Glucose-Capillary 147 (H) 70 - 99 mg/dL  Glucose, capillary     Status: Abnormal   Collection Time: 04/29/24 11:24 AM  Result Value Ref Range   Glucose-Capillary 120 (H) 70 - 99 mg/dL     Exercise Prescription Changes - 04/29/24 1030       Response to Exercise   Blood Pressure (Admit) 100/74    Blood Pressure (Exercise) 118/70    Blood Pressure (Exit) 102/66    Heart Rate (Admit) 84 bpm    Heart Rate (Exercise) 107  bpm    Heart Rate (Exit) 93 bpm    Rating of Perceived Exertion (Exercise) 11    Symptoms None    Comments Off to a good start with exercise.    Duration Continue with 30 min of aerobic exercise without signs/symptoms of physical distress.    Intensity THRR unchanged      Progression   Progression Continue to progress workloads to maintain intensity without signs/symptoms of physical distress.    Average METs 1.7      Resistance Training   Training Prescription Yes    Weight 3 lbs    Reps 10-15    Time 5 Minutes      Interval Training   Interval Training No      NuStep   Level 1    SPM 41    Minutes 15    METs 1.2      Track   Laps 9    Minutes 15    METs 2.15          Social History   Tobacco Use  Smoking Status Never  Smokeless Tobacco Never    Goals Met:  Exercise tolerated well No report of concerns or symptoms today Strength training completed today  Goals Unmet:  Not Applicable  Comments: Pt started cardiac rehab today.  Pt  tolerated light exercise without difficulty. VSS, telemetry-Sinus Rhythm, asymptomatic.  Medication list reconciled. Pt denies barriers to medicaiton compliance.  PSYCHOSOCIAL ASSESSMENT:  PHQ-1. Pt exhibits positive coping skills, hopeful outlook with supportive family. No psychosocial needs identified at this time, no psychosocial interventions necessary.   Pt oriented to exercise equipment and routine.    Understanding verbalized. Hadassah Elpidio Quan RN BSN    Dr. Wilbert Bihari is Medical Director for Cardiac Rehab at [facility].

## 2024-04-30 ENCOUNTER — Ambulatory Visit: Payer: Self-pay | Admitting: Nurse Practitioner

## 2024-04-30 ENCOUNTER — Ambulatory Visit: Payer: Self-pay | Admitting: Cardiology

## 2024-04-30 DIAGNOSIS — I6350 Cerebral infarction due to unspecified occlusion or stenosis of unspecified cerebral artery: Secondary | ICD-10-CM

## 2024-04-30 DIAGNOSIS — I25118 Atherosclerotic heart disease of native coronary artery with other forms of angina pectoris: Secondary | ICD-10-CM

## 2024-04-30 DIAGNOSIS — E119 Type 2 diabetes mellitus without complications: Secondary | ICD-10-CM

## 2024-04-30 LAB — CMP14+EGFR
ALT: 10 IU/L (ref 0–44)
AST: 12 IU/L (ref 0–40)
Albumin: 3.5 g/dL — ABNORMAL LOW (ref 3.8–4.9)
Alkaline Phosphatase: 80 IU/L (ref 47–123)
BUN/Creatinine Ratio: 14 (ref 9–20)
BUN: 17 mg/dL (ref 6–24)
Bilirubin Total: 0.3 mg/dL (ref 0.0–1.2)
CO2: 23 mmol/L (ref 20–29)
Calcium: 9.5 mg/dL (ref 8.7–10.2)
Chloride: 100 mmol/L (ref 96–106)
Creatinine, Ser: 1.23 mg/dL (ref 0.76–1.27)
Globulin, Total: 4.4 g/dL (ref 1.5–4.5)
Glucose: 86 mg/dL (ref 70–99)
Potassium: 4.4 mmol/L (ref 3.5–5.2)
Sodium: 139 mmol/L (ref 134–144)
Total Protein: 7.9 g/dL (ref 6.0–8.5)
eGFR: 69 mL/min/1.73 (ref >59)

## 2024-04-30 LAB — HEMOGLOBIN A1C
Est. average glucose Bld gHb Est-mCnc: 160 mg/dL
Hgb A1c MFr Bld: 7.2 % — ABNORMAL HIGH (ref 4.8–5.6)

## 2024-04-30 LAB — PSA: Prostate Specific Ag, Serum: 1.2 ng/mL (ref 0.0–4.0)

## 2024-04-30 MED ORDER — DAPAGLIFLOZIN PROPANEDIOL 5 MG PO TABS
5.0000 mg | ORAL_TABLET | Freq: Every day | ORAL | 3 refills | Status: AC
  Filled 2024-04-30: qty 90, 90d supply, fill #0

## 2024-05-01 ENCOUNTER — Encounter (HOSPITAL_COMMUNITY)
Admission: RE | Admit: 2024-05-01 | Discharge: 2024-05-01 | Disposition: A | Discharge status: Home / Self Care | Source: Ambulatory Visit | Attending: Cardiology | Admitting: Cardiology

## 2024-05-01 ENCOUNTER — Ambulatory Visit: Payer: Self-pay | Admitting: Cardiology

## 2024-05-01 ENCOUNTER — Other Ambulatory Visit: Payer: Self-pay

## 2024-05-01 DIAGNOSIS — Z951 Presence of aortocoronary bypass graft: Secondary | ICD-10-CM

## 2024-05-01 LAB — GLUCOSE, CAPILLARY
Glucose-Capillary: 134 mg/dL — ABNORMAL HIGH (ref 70–99)
Glucose-Capillary: 161 mg/dL — ABNORMAL HIGH (ref 70–99)

## 2024-05-03 ENCOUNTER — Encounter (HOSPITAL_COMMUNITY)
Admission: RE | Admit: 2024-05-03 | Discharge: 2024-05-03 | Disposition: A | Discharge status: Home / Self Care | Source: Ambulatory Visit | Attending: Cardiology | Admitting: Cardiology

## 2024-05-03 ENCOUNTER — Other Ambulatory Visit: Payer: Self-pay

## 2024-05-03 DIAGNOSIS — Z951 Presence of aortocoronary bypass graft: Secondary | ICD-10-CM

## 2024-05-06 ENCOUNTER — Encounter (HOSPITAL_COMMUNITY)
Admission: RE | Admit: 2024-05-06 | Discharge: 2024-05-06 | Disposition: A | Discharge status: Home / Self Care | Source: Ambulatory Visit | Attending: Cardiology | Admitting: Cardiology

## 2024-05-06 DIAGNOSIS — Z951 Presence of aortocoronary bypass graft: Secondary | ICD-10-CM

## 2024-05-08 ENCOUNTER — Encounter (HOSPITAL_COMMUNITY)
Admission: RE | Admit: 2024-05-08 | Discharge: 2024-05-08 | Disposition: A | Discharge status: Home / Self Care | Source: Ambulatory Visit | Attending: Cardiology | Admitting: Cardiology

## 2024-05-08 DIAGNOSIS — Z951 Presence of aortocoronary bypass graft: Secondary | ICD-10-CM | POA: Diagnosis not present

## 2024-05-08 NOTE — Progress Notes (Signed)
 QUALITY OF LIFE SCORE REVIEW  Pt completed Quality of Life survey as a participant in Cardiac Rehab.  Scores 19.0 or below are considered low.   Overall 19.95, Health and Function 18.88, socioeconomic 19.17, physiological and spiritual 17.75, family 27.88. Patient quality of life slightly altered by physical constraints which limits ability to perform as prior to recent cardiac illness. Zachary [name] says he is dissatisfied with his health due to his recent open heart surgery. Zachary [name] says he has a more hopeful outlook. Zachary [name] says that he applied for disability back in June. Zachary [name] say he does  not have any income currently. Zachary [name] says he is getting help from his family.  Offered emotional support and reassurance.  Will continue to monitor and intervene as necessary.Hadassah Elpidio Quan RN BSN

## 2024-05-10 ENCOUNTER — Encounter (HOSPITAL_COMMUNITY): Admission: RE | Admit: 2024-05-10 | Source: Ambulatory Visit

## 2024-05-13 ENCOUNTER — Encounter (HOSPITAL_COMMUNITY)
Admission: RE | Admit: 2024-05-13 | Discharge: 2024-05-13 | Disposition: A | Discharge status: Home / Self Care | Source: Ambulatory Visit | Attending: Cardiology | Admitting: Cardiology

## 2024-05-13 DIAGNOSIS — Z951 Presence of aortocoronary bypass graft: Secondary | ICD-10-CM | POA: Diagnosis not present

## 2024-05-15 ENCOUNTER — Encounter (HOSPITAL_COMMUNITY)
Admission: RE | Admit: 2024-05-15 | Discharge: 2024-05-15 | Disposition: A | Discharge status: Home / Self Care | Source: Ambulatory Visit | Attending: Cardiology | Admitting: Cardiology

## 2024-05-15 DIAGNOSIS — Z951 Presence of aortocoronary bypass graft: Secondary | ICD-10-CM | POA: Diagnosis present

## 2024-05-17 ENCOUNTER — Encounter (HOSPITAL_COMMUNITY)
Admission: RE | Admit: 2024-05-17 | Discharge: 2024-05-17 | Disposition: A | Discharge status: Home / Self Care | Source: Ambulatory Visit | Attending: Cardiology | Admitting: Cardiology

## 2024-05-17 DIAGNOSIS — Z951 Presence of aortocoronary bypass graft: Secondary | ICD-10-CM

## 2024-05-20 ENCOUNTER — Encounter (HOSPITAL_COMMUNITY)
Admission: RE | Admit: 2024-05-20 | Discharge: 2024-05-20 | Disposition: A | Discharge status: Home / Self Care | Source: Ambulatory Visit | Attending: Cardiology | Admitting: Cardiology

## 2024-05-20 DIAGNOSIS — Z951 Presence of aortocoronary bypass graft: Secondary | ICD-10-CM

## 2024-05-21 NOTE — Progress Notes (Signed)
 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 Swallow whole., Disp: , Rfl:    atorvastatin  (LIPITOR ) 80 MG tablet, Take 1 tablet (80 mg total) by mouth daily., Disp: 90 tablet, Rfl: 3   blood glucose meter kit and supplies KIT, Dispense based on patient and insurance preference. Use up to four times daily as directed. (FOR ICD-9 250.00, 250.01)., Disp: 1 each, Rfl: 0   Blood Glucose Monitoring Suppl (ACCU-CHEK GUIDE ME) w/Device KIT, Use as directed 2 (two) times daily., Disp: 1 kit, Rfl: 0   carvedilol  (COREG ) 12.5 MG tablet, Take 1 tablet (12.5 mg total) by mouth 2 (two) times daily., Disp: 180 tablet, Rfl: 3   clopidogrel  (PLAVIX ) 75 MG tablet, Take 1 tablet (75 mg total) by mouth daily., Disp: 90 tablet, Rfl: 1   dapagliflozin  propanediol (FARXIGA ) 5 MG TABS tablet, Take 1 tablet (5 mg total) by mouth daily., Disp: 90 tablet, Rfl: 3   furosemide  (LASIX ) 20 MG tablet, TAKE 1 TABLET (20 MG TOTAL) BY MOUTH DAILY. AS DIRECTED, Disp: 90 tablet, Rfl: 0   glucose blood (TRUE METRIX BLOOD GLUCOSE TEST) test strip, Use as instructed. Check blood glucose level by  fingerstick twice per day., Disp: 100 each, Rfl: 12   metFORMIN  (GLUCOPHAGE ) 500 MG tablet, Take 1 tablet (500 mg total) by mouth 2 (two) times daily with a meal., Disp: 180 tablet, Rfl: 1   potassium chloride  SA (KLOR-CON  M20) 20 MEQ tablet, Take 1 tablet (20 mEq total) by mouth daily. 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    Tobacco Use: Social History   Tobacco Use  Smoking Status Never  Smokeless Tobacco Never    Labs: Review Flowsheet  More data exists      Latest Ref Rng & Units 08/03/2023 01/23/2024 01/24/2024 02/02/2024 04/29/2024  Labs for ITP Cardiac and Pulmonary Rehab  Cholestrol 0 - 200 mg/dL 849  - 852  - -  LDL (calc) 0 - 99 mg/dL 89  - 91  - -  HDL-C >59 mg/dL 44  - 43  - -  Trlycerides <150 mg/dL 87  - 66  - -  Hemoglobin A1c 4.8 - 5.6 % 7.1  6.9  6.6  - 7.2   PH, Arterial 7.35 - 7.45 - - - 7.344  7.339  7.483  7.400  7.389  7.410  7.350  -  PCO2 arterial 32 - 48 mmHg - - -  35.1  32.1  27.1  38.1  38.2  33.5  41.5  -  Bicarbonate 20.0 - 28.0 mmol/L - - - 19.0  17.3  20.6  23.6  23.1  22.3  21.2  22.9  -  TCO2 22 - 32 mmol/L - - - 20  18  21  25  26  26  24  26  23  22  27  24  27   -  Acid-base deficit 0.0 - 2.0 mmol/L - - - 6.0  8.0  3.0  1.0  2.0  2.0  3.0  3.0  -  O2 Saturation % - - - 99  100  99  100  100  89  100  100  -    Details       Multiple values from one day are sorted in reverse-chronological order          Exercise Target Goals: Exercise Program Goal: Individual exercise prescription set using results from initial 6 min walk test and THRR while considering  patient's activity barriers and safety.   Exercise Prescription Goal: Initial exercise prescription builds to 30-45 minutes a day of aerobic activity, 2-3 days per week.   Home exercise guidelines will be given to patient during program as part of exercise prescription that the participant will acknowledge.   Education: Aerobic Exercise: - Group verbal and visual presentation on the components of exercise prescription. Introduces F.I.T.T principle from ACSM for exercise prescriptions.  Reviews F.I.T.T. principles of aerobic exercise including progression. Written material provided at class time.   Education: Resistance Exercise: - Group verbal and visual presentation on the components of exercise prescription. Introduces F.I.T.T principle from ACSM for exercise prescriptions  Reviews F.I.T.T. principles of resistance exercise including progression. Written material provided at class time.    Education: Exercise & Equipment Safety: - Individual verbal instruction and demonstration of equipment use and safety with use of the equipment.   Education: Exercise Physiology & General Exercise Guidelines: - Group verbal and written instruction with models to review the exercise physiology of the cardiovascular system and associated critical values. Provides general exercise guidelines with specific guidelines to those with heart or lung disease. Written material provided at class time.   Education: Flexibility, Balance, Mind/Body Relaxation: - Group verbal and visual presentation with interactive activity on the components of exercise prescription. Introduces F.I.T.T principle from ACSM for exercise prescriptions. Reviews F.I.T.T. principles of flexibility and balance exercise training including progression. Also discusses the mind body connection.  Reviews various relaxation techniques to help reduce and manage stress (i.e. Deep breathing, progressive muscle relaxation, and visualization). Balance handout provided to take home. Written material provided at class time.   Activity Barriers & Risk Stratification:  Activity Barriers & Cardiac Risk Stratification - 04/23/24  0932       Activity Barriers & Cardiac Risk Stratification   Activity Barriers Balance Concerns;Other (comment)    Comments Lack of feeling -left side from stroke    Cardiac Risk Stratification High          6 Minute Walk:  6 Minute Walk     Row Name 04/23/24 1026         6 Minute Walk   Phase Initial     Distance 989 feet     Walk Time 6 minutes     # of Rest Breaks 0     MPH 1.87     METS 3.48     RPE 10.5  Perceived Dyspnea  0     VO2 Peak 12.19     Symptoms No     Resting HR 89 bpm     Resting BP 132/78     Resting Oxygen Saturation  98 %     Exercise Oxygen Saturation  during 6 min walk 99 %     Max Ex. HR 104 bpm     Max Ex. BP 138/88     2 Minute Post BP 124/84        Oxygen Initial Assessment:   Oxygen Re-Evaluation:   Oxygen Discharge (Final Oxygen Re-Evaluation):   Initial Exercise Prescription:  Initial Exercise Prescription - 04/23/24 1500       Date of Initial Exercise RX and Referring Provider   Date 04/23/24    Referring Provider Shlomo Wilbert SAUNDERS, MD    Expected Discharge Date 07/15/24      NuStep   Level 1    SPM 75    Minutes 15    METs 2.5      Arm Ergometer   Level --    Watts --    RPM --    Minutes --    METs --      Track   Laps 11    Minutes 15    METs 2.4      Prescription Details   Frequency (times per week) 3    Duration Progress to 30 minutes of continuous aerobic without signs/symptoms of physical distress      Intensity   THRR 40-80% of Max Heartrate 66-132    Ratings of Perceived Exertion 11-13    Perceived Dyspnea 0-4      Progression   Progression Continue to progress workloads to maintain intensity without signs/symptoms of physical distress.      Resistance Training   Training Prescription Yes    Weight 3 lbs    Reps 10-15          Perform Capillary Blood Glucose checks as needed.  Exercise Prescription Changes:   Exercise Prescription Changes     Row Name 04/29/24 1030 05/08/24  1029 05/20/24 1035         Response to Exercise   Blood Pressure (Admit) 100/74 114/66 110/68     Blood Pressure (Exercise) 118/70 132/78 140/92     Blood Pressure (Exit) 102/66 112/78 120/62     Heart Rate (Admit) 84 bpm 80 bpm 85 bpm     Heart Rate (Exercise) 107 bpm 113 bpm 113 bpm     Heart Rate (Exit) 93 bpm 88 bpm 85 bpm     Rating of Perceived Exertion (Exercise) 11 12 12      Symptoms None None None     Comments Off to a good start with exercise. Encouraged Coleson to increase pace on th eNuStep. --     Duration Continue with 30 min of aerobic exercise without signs/symptoms of physical distress. Continue with 30 min of aerobic exercise without signs/symptoms of physical distress. Continue with 30 min of aerobic exercise without signs/symptoms of physical distress.     Intensity THRR unchanged THRR unchanged THRR unchanged       Progression   Progression Continue to progress workloads to maintain intensity without signs/symptoms of physical distress. Continue to progress workloads to maintain intensity without signs/symptoms of physical distress. Continue to progress workloads to maintain intensity without signs/symptoms of physical distress.     Average METs 1.7 2.1 2       Resistance Training  Training Prescription Yes No Yes     Weight 3 lbs Relaxation day, no weights 3 lbs     Reps 10-15 -- 10-15     Time 5 Minutes -- 5 Minutes       Interval Training   Interval Training No No No       NuStep   Level 1 1 1      SPM 41 65 63     Minutes 15 15 15      METs 1.2 1.6 1.5       Track   Laps 9 12 12      Minutes 15 15 15      METs 2.15 2.53 2.53        Exercise Comments:   Exercise Comments     Row Name 04/29/24 1134           Exercise Comments Desman tolerated low intensity exercise fair without symptoms. Very slow pace with walking secondary to stroke in the past and slow pace in the NuStep. Oriented to the exercise equipment, stretching, and hand weight routine.           Exercise Goals and Review:   Exercise Goals     Row Name 04/23/24 0917             Exercise Goals   Increase Physical Activity Yes       Intervention Provide advice, education, support and counseling about physical activity/exercise needs.;Develop an individualized exercise prescription for aerobic and resistive training based on initial evaluation findings, risk stratification, comorbidities and participant's personal goals.       Expected Outcomes Long Term: Exercising regularly at least 3-5 days a week.;Short Term: Attend rehab on a regular basis to increase amount of physical activity.;Long Term: Add in home exercise to make exercise part of routine and to increase amount of physical activity.       Increase Strength and Stamina Yes       Intervention Provide advice, education, support and counseling about physical activity/exercise needs.;Develop an individualized exercise prescription for aerobic and resistive training based on initial evaluation findings, risk stratification, comorbidities and participant's personal goals.       Expected Outcomes Short Term: Increase workloads from initial exercise prescription for resistance, speed, and METs.;Short Term: Perform resistance training exercises routinely during rehab and add in resistance training at home;Long Term: Improve cardiorespiratory fitness, muscular endurance and strength as measured by increased METs and functional capacity ( )       Able to understand and use rate of perceived exertion (RPE) scale Yes       Intervention Provide education and explanation on how to use RPE scale       Expected Outcomes Short Term: Able to use RPE daily in rehab to express subjective intensity level;Long Term:  Able to use RPE to guide intensity level when exercising independently       Knowledge and understanding of Target Heart Rate Range (THRR) Yes       Intervention Provide education and explanation of THRR including how the  numbers were predicted and where they are located for reference       Expected Outcomes Short Term: Able to state/look up THRR;Long Term: Able to use THRR to govern intensity when exercising independently;Short Term: Able to use daily as guideline for intensity in rehab       Able to check pulse independently Yes       Intervention Provide education and demonstration on how to check pulse in carotid  and radial arteries.;Review the importance of being able to check your own pulse for safety during independent exercise       Expected Outcomes Short Term: Able to explain why pulse checking is important during independent exercise;Long Term: Able to check pulse independently and accurately       Understanding of Exercise Prescription Yes       Intervention Provide education, explanation, and written materials on patient's individual exercise prescription       Expected Outcomes Short Term: Able to explain program exercise prescription;Long Term: Able to explain home exercise prescription to exercise independently          Exercise Goals Re-Evaluation :  Exercise Goals Re-Evaluation     Row Name 04/29/24 1134             Exercise Goal Re-Evaluation   Exercise Goals Review Increase Physical Activity;Increase Strength and Stamina;Able to understand and use rate of perceived exertion (RPE) scale       Comments Harsh was able to understand and use RPE scale appropriately.       Expected Outcomes Progress workloads as tolerated to help increase strength and stamina.          Discharge Exercise Prescription (Final Exercise Prescription Changes):  Exercise Prescription Changes - 05/20/24 1035       Response to Exercise   Blood Pressure (Admit) 110/68    Blood Pressure (Exercise) 140/92    Blood Pressure (Exit) 120/62    Heart Rate (Admit) 85 bpm    Heart Rate (Exercise) 113 bpm    Heart Rate (Exit) 85 bpm    Rating of Perceived Exertion (Exercise) 12    Symptoms None    Duration Continue  with 30 min of aerobic exercise without signs/symptoms of physical distress.    Intensity THRR unchanged      Progression   Progression Continue to progress workloads to maintain intensity without signs/symptoms of physical distress.    Average METs 2      Resistance Training   Training Prescription Yes    Weight 3 lbs    Reps 10-15    Time 5 Minutes      Interval Training   Interval Training No      NuStep   Level 1    SPM 63    Minutes 15    METs 1.5      Track   Laps 12    Minutes 15    METs 2.53          Nutrition:  Target Goals: Understanding of nutrition guidelines, daily intake of sodium 1500mg , cholesterol 200mg , calories 30% from fat and 7% or less from saturated fats, daily to have 5 or more servings of fruits and vegetables.  Education: Nutrition 1 -Group instruction provided by verbal, written material, interactive activities, discussions, models, and posters to present general guidelines for heart healthy nutrition including macronutrients, label reading, and promoting whole foods over processed counterparts. Education serves as Pensions consultant of discussion of heart healthy eating for all. Written material provided at class time.    Education: Nutrition 2 -Group instruction provided by verbal, written material, interactive activities, discussions, models, and posters to present general guidelines for heart healthy nutrition including sodium, cholesterol, and saturated fat. Providing guidance of habit forming to improve blood pressure, cholesterol, and body weight. Written material provided at class time.     Biometrics:  Pre Biometrics - 04/23/24 0820       Pre Biometrics   Waist Circumference 38.75  inches    Hip Circumference 40.75 inches    Waist to Hip Ratio 0.95 %    Triceps Skinfold 15 mm    % Body Fat 25.4 %    Grip Strength 21 kg    Flexibility 9.25 in    Single Leg Stand 7.37 seconds           Nutrition Therapy Plan and Nutrition  Goals:   Nutrition Assessments:  MEDIFICTS Score Key: >=70 Need to make dietary changes  40-70 Heart Healthy Diet <= 40 Therapeutic Level Cholesterol Diet  Flowsheet Row CARDIAC REHAB PHASE II EXERCISE from 04/29/2024 in [facility] for Heart, Vascular, & Lung Health  Picture Your Plate Total Score on Admission 54   Picture Your Plate Scores: <59 Unhealthy dietary pattern with much room for improvement. 41-50 Dietary pattern unlikely to meet recommendations for good health and room for improvement. 51-60 More healthful dietary pattern, with some room for improvement.  >60 Healthy dietary pattern, although there may be some specific behaviors that could be improved.    Nutrition Goals Re-Evaluation:   Nutrition Goals Discharge (Final Nutrition Goals Re-Evaluation):   Psychosocial: Target Goals: Acknowledge presence or absence of significant depression and/or stress, maximize coping skills, provide positive support system. Participant is able to verbalize types and ability to use techniques and skills needed for reducing stress and depression.   Education: Stress, Anxiety, and Depression - Group verbal and visual presentation to define topics covered.  Reviews how body is impacted by stress, anxiety, and depression.  Also discusses healthy ways to reduce stress and to treat/manage anxiety and depression. Written material provided at class time.   Education: Sleep Hygiene -Provides group verbal and written instruction about how sleep can affect your health.  Define sleep hygiene, discuss sleep cycles and impact of sleep habits. Review good sleep hygiene tips.   Initial Review & Psychosocial Screening:  Initial Psych Review & Screening - 04/23/24 0915       Initial Review   Current issues with None Identified      Family Dynamics   Good Support System? Yes    Comments Kaeo has support from his ex-wife, son, and daughter.      Screening  Interventions   Interventions Encouraged to exercise;Provide feedback about the scores to participant    Expected Outcomes Short Term goal: Identification and review with participant of any Quality of Life or Depression concerns found by scoring the questionnaire.;Long Term goal: The participant improves quality of Life and PHQ9 Scores as seen by post scores and/or verbalization of changes;Short Term goal: Utilizing psychosocial counselor, staff and physician to assist with identification of specific Stressors or current issues interfering with healing process. Setting desired goal for each stressor or current issue identified.;Long Term Goal: Stressors or current issues are controlled or eliminated.          Quality of Life Scores:   Quality of Life - 04/23/24 0939       Quality of Life   Select Quality of Life      Quality of Life Scores   Health/Function Pre 18.88 %    Socioeconomic Pre 19.17 %    Psych/Spiritual Pre 17.75 %    Family Pre 27.88 %    GLOBAL Pre 19.95 %         Scores of 19 and below usually indicate a poorer quality of life in these areas.  A difference of  2-3 points is a clinically meaningful difference.  A  difference of 2-3 points in the total score of the Quality of Life Index has been associated with significant improvement in overall quality of life, self-image, physical symptoms, and general health in studies assessing change in quality of life.  PHQ-9: Review Flowsheet  More data exists      04/29/2024 04/23/2024 02/07/2024 08/03/2023 12/24/2020  Depression screen PHQ 2/9  Decreased Interest 0 0 0 0 0  Down, Depressed, Hopeless 0 0 0 0 0  PHQ - 2 Score 0 0 0 0 0  Altered sleeping 0 0 - 0 0  Tired, decreased energy 0 0 - 0 0  Change in appetite 0 1 - 0 0  Feeling bad or failure about yourself  0 0 - 0 0  Trouble concentrating 0 0 - 0 0  Moving slowly or fidgety/restless 0 0 - 0 0  Suicidal thoughts 0 0 - 0 0  PHQ-9 Score 0 1 - 0 0  Difficult doing  work/chores Not difficult at all Not difficult at all - - -   Interpretation of Total Score  Total Score Depression Severity:  1-4 = Minimal depression, 5-9 = Mild depression, 10-14 = Moderate depression, 15-19 = Moderately severe depression, 20-27 = Severe depression   Psychosocial Evaluation and Intervention:   Psychosocial Re-Evaluation:  Psychosocial Re-Evaluation     Row Name 04/29/24 1655 05/21/24 0945           Psychosocial Re-Evaluation   Current issues with None Identified Current Stress Concerns      Comments Azzan says he is in the process of applying for disabilty. Will review PHQ9 in the upcoming future. PHQ9 reviewed on 09/22/2.Trayden says he is dissatisfied with his health due to his recent open heart surgery. Duvid says he has a more hopeful outlook. Jordan says that he applied for disability back in June. Zaydon say he does  not have any income currently. Gal says he is getting help from his family.      Expected Outcomes -- Geffrey will have controlled or decreased stress concerns upon completion of cardiac rehab      Interventions Encouraged to attend Cardiac Rehabilitation for the exercise Encouraged to attend Cardiac Rehabilitation for the exercise      Continue Psychosocial Services  Follow up required by staff Follow up required by staff        Initial Review   Source of Stress Concerns -- Retirement/disability;Financial      Comments -- Will continue to montior and offer support as needed.         Psychosocial Discharge (Final Psychosocial Re-Evaluation):  Psychosocial Re-Evaluation - 05/21/24 0945       Psychosocial Re-Evaluation   Current issues with Current Stress Concerns    Comments PHQ9 reviewed on 09/22/2.Khi says he is dissatisfied with his health due to his recent open heart surgery. Alwaleed says he has a more hopeful outlook. Saeed says that he applied for disability back in June. Daquavion say he does  not have any income currently. Raymund  says he is getting help from his family.    Expected Outcomes Rowland will have controlled or decreased stress concerns upon completion of cardiac rehab    Interventions Encouraged to attend Cardiac Rehabilitation for the exercise    Continue Psychosocial Services  Follow up required by staff      Initial Review   Source of Stress Concerns Retirement/disability;Financial    Comments Will continue to montior and offer support as needed.  Vocational Rehabilitation: Provide vocational rehab assistance to qualifying candidates.   Vocational Rehab Evaluation & Intervention:  Vocational Rehab - 04/23/24 0952       Initial Vocational Rehab Evaluation & Intervention   Assessment shows need for Vocational Rehabilitation Yes    Vocational Rehab Packet given to patient 04/23/24          Education: Education Goals: Education classes will be provided on a variety of topics geared toward better understanding of heart health and risk factor modification. Participant will state understanding/return demonstration of topics presented as noted by education test scores.  Learning Barriers/Preferences:  Learning Barriers/Preferences - 04/23/24 0951       Learning Barriers/Preferences   Learning Barriers None    Learning Preferences Verbal Instruction          General Cardiac Education Topics:  AED/CPR: - Group verbal and written instruction with the use of models to demonstrate the basic use of the AED with the basic ABC's of resuscitation.   Test and Procedures: - Group verbal and visual presentation and models provide information about basic cardiac anatomy and function. Reviews the testing methods done to diagnose heart disease and the outcomes of the test results. Describes the treatment choices: Medical Management, Angioplasty, or Coronary Bypass Surgery for treating various heart conditions including Myocardial Infarction, Angina, Valve Disease, and Cardiac Arrhythmias.  Written material provided at class time.   Medication Safety: - Group verbal and visual instruction to review commonly prescribed medications for heart and lung disease. Reviews the medication, class of the drug, and side effects. Includes the steps to properly store meds and maintain the prescription regimen. Written material provided at class time.   Intimacy: - Group verbal instruction through game format to discuss how heart and lung disease can affect sexual intimacy. Written material provided at class time.   Know Your Numbers and Heart Failure: - Group verbal and visual instruction to discuss disease risk factors for cardiac and pulmonary disease and treatment options.  Reviews associated critical values for Overweight/Obesity, Hypertension, Cholesterol, and Diabetes.  Discusses basics of heart failure: signs/symptoms and treatments.  Introduces Heart Failure Zone chart for action plan for heart failure. Written material provided at class time.   Infection Prevention: - Provides verbal and written material to individual with discussion of infection control including proper hand washing and proper equipment cleaning during exercise session.   Falls Prevention: - Provides verbal and written material to individual with discussion of falls prevention and safety.   Other: -Provides group and verbal instruction on various topics (see comments)   Knowledge Questionnaire Score:  Knowledge Questionnaire Score - 04/23/24 1559       Knowledge Questionnaire Score   Pre Score 16/24          Core Components/Risk Factors/Patient Goals at Admission:  Personal Goals and Risk Factors at Admission - 04/23/24 0916       Core Components/Risk Factors/Patient Goals on Admission   Diabetes Yes    Intervention Provide education about signs/symptoms and action to take for hypo/hyperglycemia.;Provide education about proper nutrition, including hydration, and aerobic/resistive exercise  prescription along with prescribed medications to achieve blood glucose in normal ranges: Fasting glucose 65-99 mg/dL    Expected Outcomes Short Term: Participant verbalizes understanding of the signs/symptoms and immediate care of hyper/hypoglycemia, proper foot care and importance of medication, aerobic/resistive exercise and nutrition plan for blood glucose control.;Long Term: Attainment of HbA1C < 7%.    Hypertension Yes    Intervention Provide education on lifestyle modifcations  including regular physical activity/exercise, weight management, moderate sodium restriction and increased consumption of fresh fruit, vegetables, and low fat dairy, alcohol moderation, and smoking cessation.;Monitor prescription use compliance.    Expected Outcomes Short Term: Continued assessment and intervention until BP is < 140/31mm HG in hypertensive participants. < 130/31mm HG in hypertensive participants with diabetes, heart failure or chronic kidney disease.;Long Term: Maintenance of blood pressure at goal levels.    Lipids Yes    Intervention Provide education and support for participant on nutrition & aerobic/resistive exercise along with prescribed medications to achieve LDL 70mg , HDL >40mg .    Expected Outcomes Short Term: Participant states understanding of desired cholesterol values and is compliant with medications prescribed. Participant is following exercise prescription and nutrition guidelines.;Long Term: Cholesterol controlled with medications as prescribed, with individualized exercise RX and with personalized nutrition plan. Value goals: LDL < 70mg , HDL > 40 mg.    Personal Goal Other Yes    Personal Goal Improve mobility/ walking. Be able to go back to work.    Intervention Develop an exercise action plan that Les can do to help improve mobility, increase walking ability. Vocational Rehab information provided.    Expected Outcomes Alize will be able to get around comfortably with better mobility  as measured by self-report and 6-minute walk test.          Education:Diabetes - Individual verbal and written instruction to review signs/symptoms of diabetes, desired ranges of glucose level fasting, after meals and with exercise. Acknowledge that pre and post exercise glucose checks will be done for 3 sessions at entry of program.   Core Components/Risk Factors/Patient Goals Review:   Goals and Risk Factor Review     Row Name 04/29/24 1658 05/21/24 0948           Core Components/Risk Factors/Patient Goals Review   Personal Goals Review Weight Management/Obesity;Stress;Diabetes;Lipids;Hypertension Weight Management/Obesity;Stress;Diabetes;Lipids;Hypertension      Review Osric started cardiac rehab on 04/29/24.Pistol did fair with exercise. Vital signs and CBG's were stable. Brayn is decontioned. Jene started cardiac rehab on 04/29/24.Korrey is doing well with exercise at cardiac rehab for his fitness level. Vital signs and CBG's have been stable.      Expected Outcomes Jaxton will continue to participate in cardiac rehab for exercise, nutrition and lifestyle modifcations. Theodus will continue to participate in cardiac rehab for exercise, nutrition and lifestyle modifcations.         Core Components/Risk Factors/Patient Goals at Discharge (Final Review):   Goals and Risk Factor Review - 05/21/24 0948       Core Components/Risk Factors/Patient Goals Review   Personal Goals Review Weight Management/Obesity;Stress;Diabetes;Lipids;Hypertension    Review Quinlin started cardiac rehab on 04/29/24.Johnathen is doing well with exercise at cardiac rehab for his fitness level. Vital signs and CBG's have been stable.    Expected Outcomes Haydin will continue to participate in cardiac rehab for exercise, nutrition and lifestyle modifcations.          ITP Comments:  ITP Comments     Row Name 04/23/24 0820 04/29/24 1511 04/29/24 1654 05/21/24 0944     ITP Comments Medical Director- Dr  Wilbert Bihari, MD. Introduction to the Cardiac Rehab Program. Initial orientation folder reviewed with Sherwin. 30-day ITP review. Anddy started cardiac rehab today and tolerated low intensity exercise without symptoms. 30 Day ITP Review. Jhoel started cardiac rehab on 04/29/24. Yunus did fair with exercise for his fitness level. 30 Day ITP Review. Jemario started cardiac rehab on 04/29/24. Cinque is off to  a good start with exercise for his fitness level.       Comments: See ITP Comments

## 2024-05-22 ENCOUNTER — Encounter (HOSPITAL_COMMUNITY): Admission: RE | Admit: 2024-05-22 | Source: Ambulatory Visit

## 2024-05-24 ENCOUNTER — Encounter (HOSPITAL_COMMUNITY)
Admission: RE | Admit: 2024-05-24 | Discharge: 2024-05-24 | Disposition: A | Discharge status: Home / Self Care | Source: Ambulatory Visit | Attending: Cardiology | Admitting: Cardiology

## 2024-05-24 VITALS — BP 130/84 | HR 85 | Wt 171.1 lb

## 2024-05-24 DIAGNOSIS — Z951 Presence of aortocoronary bypass graft: Secondary | ICD-10-CM | POA: Diagnosis not present

## 2024-05-24 NOTE — Progress Notes (Signed)
 Reviewed home exercise guidelines with Zachary [name] including endpoints, temperature precautions, target heart rate and rate of perceived exertion. He is currently walking 15 minutes 2-3 days/week as his mode of home exercise. He is still limited by some left-sided weakness from previous stroke. Zachary [name] voices understanding of instructions given.  Zachary CHRISTELLA Gal, MS, ACSM CEP

## 2024-05-27 ENCOUNTER — Encounter (HOSPITAL_COMMUNITY): Admission: RE | Admit: 2024-05-27 | Source: Ambulatory Visit

## 2024-05-27 NOTE — Progress Notes (Unsigned)
 Cardiology Office Note    Date:  05/28/2024  ID:  Zachary [name], DOB 08-19-1970, MRN [id number] PCP:  Theotis Haze ORN, NP  Cardiologist:  Wilbert Bihari, MD  Electrophysiologist:  None   Chief Complaint: f/u CAD  History of Present Illness: Zachary [name] is a 53 y.o. male with visit-pertinent history of hypertension, HLD, diabetes mellitus, prior CVA 2018 (on ASA + Plavix  prior to recent cardiac eval), CAD s/p CABG 01/2024 seen for follow-up.  The patient was originally admitted 01/2024 with post-prandial vomiting with reported unrevealing GI workup, but also noting decreased exercise tolerance for the past year. He had stopped his BP meds due to GI symptoms. Our team saw him in the hospital for chest pain and mildly elevated troponin (peak 126). 2d echo showed EF 55-60% with small apical inferior WMA, G1DD, aortic sclerosis without stenosis. He also had NSVT on telemetry. Cor CTA then cath showed MVCAD. He was discharged home and returned for planned CABG on 02/02/24 s/p LIMA-LAD, SVG-AM, SVG-OM, SVG-Diag. Post-op course notable for transient RBBB and diffuse ST elevation/possible pericarditis on EKG but does not look like he required specific intervention for the latter. Last saw Jackee Alberts, NP 02/20/24 and felt to be doing well. CXR showed tiny pleural effusions. He later saw CVTS in 02/2024 with CXR raising question of splenic artery aneurysm so underwent CT abd showing negative for this finding, did note diverticular disease, small bilateral pleural effusions, small volume pericardial fluid. They did not feel these findings required any further intervention. His insurance required evaluation to qualify for cardiac rehab so he had an ETT 03/2024 which was nondiagnostic due to inability to reach 85% PMHR, no ST deviation, normal hemodynamic response, no significant exercise induced arrhythmias. Cardiac rehab was subsequently approved and he has been doing well with this.  He returns for  follow-up doing great without CP, SOB, palpitations, nausea, vomiting, orthopnea, edema. He is unsure if he is still taking Lasix  (last rx'd #90 with zero refills in 03/2024) but knows he is not taking potassium any longer (last rx'd #30 with zero refills in 02/2024). He is planning on moving to [place] at the end of his month with family.  Labwork independently reviewed: 04/2024 A1c 7.2 02/2024 Cr 1.03, K 4.8, Hgb 12.3, plt 753 01/2024 Mg OK, LDL 91, trig 66, TSH OK   ROS: .    Please see the history of present illness. All other systems are reviewed and otherwise negative.  Studies Reviewed: SABRA    EKG:  EKG is ordered today, personally reviewed, demonstrating  EKG Interpretation Date/Time:  Tuesday May 28 2024 10:37:48 EDT Ventricular Rate:  76 PR Interval:  126 QRS Duration:  82 QT Interval:  366 QTC Calculation: 411 R Axis:   8  Text Interpretation: Normal sinus rhythm Possible Left atrial enlargement Inferior infarct (cited on or before 02-Feb-2024) Previous RBBB and diffuse ST elevation has resolved from prior EKG Nonspecific TW changes Confirmed by Ioannis Schuh [phone]) on 05/28/2024 10:46:31 AM    CV Studies: Cardiac studies reviewed are outlined and summarized above. Otherwise please see EMR for full report.   Current Reported Medications:.    Current Meds  Medication Sig   Accu-Chek Softclix Lancets lancets Check glucose bu fingerstick twice a day.   amLODipine  (NORVASC ) 10 MG tablet Take 1 tablet (10 mg total) by mouth daily.   aspirin  EC 81 MG tablet Take 1 tablet (81 mg total) by mouth daily. Swallow whole.  atorvastatin  (LIPITOR ) 80 MG tablet Take 1 tablet (80 mg total) by mouth daily.   blood glucose meter kit and supplies KIT Dispense based on patient and insurance preference. Use up to four times daily as directed. (FOR ICD-9 250.00, 250.01).   Blood Glucose Monitoring Suppl (ACCU-CHEK GUIDE ME) w/Device KIT Use as directed 2 (two) times daily.   carvedilol   (COREG ) 12.5 MG tablet Take 1 tablet (12.5 mg total) by mouth 2 (two) times daily.   clopidogrel  (PLAVIX ) 75 MG tablet Take 1 tablet (75 mg total) by mouth daily.   dapagliflozin  propanediol (FARXIGA ) 5 MG TABS tablet Take 1 tablet (5 mg total) by mouth daily.   furosemide  (LASIX ) 20 MG tablet TAKE 1 TABLET (20 MG TOTAL) BY MOUTH DAILY. AS DIRECTED   glucose blood (TRUE METRIX BLOOD GLUCOSE TEST) test strip Use as instructed. Check blood glucose level by fingerstick twice per day.   metFORMIN  (GLUCOPHAGE ) 500 MG tablet Take 1 tablet (500 mg total) by mouth 2 (two) times daily with a meal.    Physical Exam:    VS:  BP 120/70 (BP Location: Left Arm, Patient Position: Sitting, Cuff Size: Normal)   Pulse 76   Ht 5' 10 (1.778 m)   Wt 170 lb (77.1 kg)   BMI 24.39 kg/m    Wt Readings from Last 3 Encounters:  05/28/24 170 lb (77.1 kg)  04/29/24 170 lb (77.1 kg)  04/23/24 173 lb 11.6 oz (78.8 kg)    GEN: Well nourished, well developed in no acute distress NECK: No JVD; No carotid bruits. Left eye shows conjunctival injection CARDIAC: RRR, no murmurs, rubs, gallops RESPIRATORY:  Clear to auscultation without rales, wheezing or rhonchi  ABDOMEN: Soft, non-tender, non-distended EXTREMITIES:  No edema; No acute deformity   Asessement and Plan:.    1. CAD S/p CABG, HLD - doing great without recent angina. While EKG still shows nonspecific TW changes, it looks significantly improved from last hospital EKG in 01/2024 when he had RBBB with ST elevation. He is feeling well with cardiac rehab. Continue ASA 81mg  daily, Plavix  75mg  daily (note on chronic DAPT since CVA 2018), atorvastatin  80mg  daily, amlodipine  10mg  daily, carvedilol  12.5mg  BID. Will get CBC, CMET, lipids - he has some conjunctival injection today of the left eye that he feels might represent pinkeye for which I advised he see urgent care or PCP today. I advised that he may return another day to have labs drawn.   2. Pleural effusions and  pericardial effusion - s/p course of Lasix /potassium in post-op phase. Lungs are clear, no SOB, no orthopnea. Will remove KCl from med list since he is no longer on this. He will call us  to clarify if he is taking Lasix  any longer. Await labs to help guide rec as well.  3. HTN - stable on current regimen, no changes made today.  4. NSVT - noted in hospital prior to CABG, now quiescent by symptoms. Continue beta blocker.    Disposition: F/u with [facility] -patient will be moving to [place] later this month. I asked RN to share with him the process for transferring records once he has selected a cardiologist to follow with up there.  Signed, Lainee Lehrman N Alyxis Grippi, PA-C

## 2024-05-28 ENCOUNTER — Ambulatory Visit: Admitting: Nurse Practitioner

## 2024-05-28 ENCOUNTER — Encounter: Payer: Self-pay | Admitting: Physician Assistant

## 2024-05-28 ENCOUNTER — Ambulatory Visit: Attending: Physician Assistant | Admitting: Physician Assistant

## 2024-05-28 VITALS — BP 120/70 | HR 76 | Ht 70.0 in | Wt 170.0 lb

## 2024-05-28 DIAGNOSIS — I10 Essential (primary) hypertension: Secondary | ICD-10-CM | POA: Diagnosis present

## 2024-05-28 DIAGNOSIS — E785 Hyperlipidemia, unspecified: Secondary | ICD-10-CM | POA: Insufficient documentation

## 2024-05-28 DIAGNOSIS — I2510 Atherosclerotic heart disease of native coronary artery without angina pectoris: Secondary | ICD-10-CM | POA: Insufficient documentation

## 2024-05-28 DIAGNOSIS — I4729 Other ventricular tachycardia: Secondary | ICD-10-CM | POA: Diagnosis present

## 2024-05-28 DIAGNOSIS — J90 Pleural effusion, not elsewhere classified: Secondary | ICD-10-CM | POA: Insufficient documentation

## 2024-05-28 NOTE — Patient Instructions (Addendum)
 Medication Instructions:   Please call the office  let us  know if you are taking Lasix  ( furosemide ) and if so what is the dosage.  *If you need a refill on your cardiac medications before your next appointment, please call your pharmacy*   Lab Work: fasting CMET LIPIDs CBC  If you have labs (blood work) drawn today and your tests are completely normal, you will receive your results only by: MyChart Message (if you have MyChart) OR A paper copy in the mail If you have any lab test that is abnormal or we need to change your treatment, we will call you to review the results.   Testing/Procedures: Not  needed   Follow-Up: At [facility], you and your health needs are our priority.  As part of our continuing mission to provide you with exceptional heart care, we have created designated Provider Care Teams.  These Care Teams include your primary Cardiologist (physician) and Advanced Practice Providers (APPs -  Physician Assistants and Nurse Practitioners) who all work together to provide you with the care you need, when you need it.     Your next appointment:   As needed    The format for your next appointment:   In Person  Provider:   Raphael Bring, PA-C         Other Instructions   Please go to urgent care or PCP  to have your eyes  evaluated- today    When you establish with an new cardiologist in [place]. Have the their office send for your  medical records

## 2024-05-29 ENCOUNTER — Ambulatory Visit: Payer: Self-pay | Admitting: Physician Assistant

## 2024-05-29 ENCOUNTER — Encounter (HOSPITAL_COMMUNITY)

## 2024-05-29 LAB — COMPREHENSIVE METABOLIC PANEL WITH GFR
ALT: 25 IU/L (ref 0–44)
AST: 25 IU/L (ref 0–40)
Albumin: 3.8 g/dL (ref 3.8–4.9)
Alkaline Phosphatase: 100 IU/L (ref 47–123)
BUN/Creatinine Ratio: 12 (ref 9–20)
BUN: 13 mg/dL (ref 6–24)
Bilirubin Total: 0.4 mg/dL (ref 0.0–1.2)
CO2: 22 mmol/L (ref 20–29)
Calcium: 9.1 mg/dL (ref 8.7–10.2)
Chloride: 99 mmol/L (ref 96–106)
Creatinine, Ser: 1.1 mg/dL (ref 0.76–1.27)
Globulin, Total: 3.9 g/dL (ref 1.5–4.5)
Glucose: 94 mg/dL (ref 70–99)
Potassium: 4.5 mmol/L (ref 3.5–5.2)
Sodium: 138 mmol/L (ref 134–144)
Total Protein: 7.7 g/dL (ref 6.0–8.5)
eGFR: 79 mL/min/1.73 (ref >59)

## 2024-05-29 LAB — CBC
Hematocrit: 37.8 % (ref 37.5–51.0)
Hemoglobin: 11.6 g/dL — ABNORMAL LOW (ref 13.0–17.7)
MCH: 25.2 pg — ABNORMAL LOW (ref 26.6–33.0)
MCHC: 30.7 g/dL — ABNORMAL LOW (ref 31.5–35.7)
MCV: 82 fL (ref 79–97)
Platelets: 478 x10E3/uL — ABNORMAL HIGH (ref 150–450)
RBC: 4.6 x10E6/uL (ref 4.14–5.80)
RDW: 14.4 % (ref 11.6–15.4)
WBC: 7.6 x10E3/uL (ref 3.4–10.8)

## 2024-05-29 LAB — LIPID PANEL
Chol/HDL Ratio: 3 ratio (ref 0.0–5.0)
Cholesterol, Total: 137 mg/dL (ref 100–199)
HDL: 45 mg/dL (ref >39)
LDL Chol Calc (NIH): 79 mg/dL (ref 0–99)
Triglycerides: 60 mg/dL (ref 0–149)
VLDL Cholesterol Cal: 13 mg/dL (ref 5–40)

## 2024-05-30 ENCOUNTER — Telehealth (HOSPITAL_COMMUNITY): Payer: Self-pay | Admitting: *Deleted

## 2024-05-30 ENCOUNTER — Ambulatory Visit
Admission: EM | Admit: 2024-05-30 | Discharge: 2024-05-30 | Disposition: A | Discharge status: Home / Self Care | Attending: Family Medicine | Admitting: Family Medicine

## 2024-05-30 DIAGNOSIS — H1032 Unspecified acute conjunctivitis, left eye: Secondary | ICD-10-CM | POA: Diagnosis not present

## 2024-05-30 HISTORY — DX: Adjustment disorder with mixed anxiety and depressed mood: F43.23

## 2024-05-30 MED ORDER — GENTAMICIN SULFATE 0.3 % OP SOLN: 2.0000 [drp] | Freq: Three times a day (TID) | OPHTHALMIC | 0 refills | Status: AC

## 2024-05-30 NOTE — ED Provider Notes (Signed)
 [name]    CSN: [id number] Arrival date & time: 05/30/24  1100      History   Chief Complaint Chief Complaint  Patient presents with   Eye Problem    HPI Zachary [name] is a 53 y.o. male.    Eye Problem Here for irritation and redness of his left eye.  Began bothering him some on October 12 or so.  No cough or congestion.  No severe pain.  It is tearing and draining a little bit.  NKDA  Medical history significant for diabetes and stroke and CAD  Past Medical History:  Diagnosis Date   Adjustment disorder with mixed anxiety and depressed mood    Diabetes mellitus without complication (HCC)    Hypertension    Stroke (HCC) 05/2017   Right Pontine Stroke    Patient Active Problem List   Diagnosis Date Noted   S/P CABG x 4 02/02/2024   Chest pain 01/26/2024   Coronary artery disease of native artery of native heart with stable angina pectoris 01/26/2024   Regurgitation of food 01/24/2024   Unstable angina (HCC) 01/23/2024   Troponin level elevated 01/23/2024   Gastroesophageal reflux disease without esophagitis 01/23/2024   Hypertensive urgency 01/23/2024   Pure hypercholesterolemia 01/23/2024   History of CVA (cerebrovascular accident) 01/23/2024   Regurgitation and rechewing 02/13/2023   Sleep disturbance    Hyperlipidemia LDL goal <70    Hypokalemia    Hyponatremia    AKI (acute kidney injury)    Diabetes mellitus type 2 in nonobese (HCC)    Benign essential HTN    Gait disturbance, post-stroke 03/23/2017   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 03/18/2017    Past Surgical History:  Procedure Laterality Date   CORONARY ARTERY BYPASS GRAFT N/A 02/02/2024   Procedure: CORONARY ARTERY BYPASS GRAFTING X 4, USING LEFT INTERNAL MAMMARY ARTERY AND ENDOSCOPIC  HARVESTED RIGHT GREATER SAPHENOUS VEIN;  Surgeon: Kerrin Elspeth BROCKS, MD;  Location: [facility] OR;  Service: Open Heart Surgery;  Laterality: N/A;   INTRAOPERATIVE TRANSESOPHAGEAL ECHOCARDIOGRAM N/A 02/02/2024   Procedure: ECHOCARDIOGRAM, TRANSESOPHAGEAL, INTRAOPERATIVE;  Surgeon: Kerrin Elspeth BROCKS, MD;  Location: [facility] OR;  Service: Open Heart Surgery;  Laterality: N/A;   LEFT HEART CATH AND CORONARY ANGIOGRAPHY N/A 01/25/2024   Procedure: LEFT HEART CATH AND CORONARY ANGIOGRAPHY;  Surgeon: Mady Bruckner, MD;  Location: [facility] INVASIVE CV LAB;  Service: Cardiovascular;  Laterality: N/A;       Home Medications    Prior to Admission medications   Medication Sig Start Date End Date Taking? Authorizing Provider  gentamicin (GARAMYCIN) 0.3 % ophthalmic solution Place 2 drops into the left eye 3 (three) times daily for 5 days. 05/30/24 06/04/24 Yes Vonna Sharlet POUR, MD  Accu-Chek Softclix Lancets lancets Check glucose bu fingerstick twice a day. 02/07/24   Fleming, Zelda W, NP  amLODipine  (NORVASC ) 10 MG tablet Take 1 tablet (10 mg total) by mouth daily. 08/03/23   Danton Jon HERO, PA-C  aspirin  EC 81 MG tablet Take 1 tablet (81 mg total) by mouth daily. Swallow whole. 01/29/24   Lelon Hamilton T, PA-C  atorvastatin  (LIPITOR ) 80 MG tablet Take 1 tablet (80 mg total) by mouth daily. 01/29/24   Lelon Hamilton T, PA-C  blood glucose meter kit and supplies KIT Dispense based on patient and insurance preference. Use up to four times daily  as directed. (FOR ICD-9 250.00, 250.01). 04/12/17   Babs Arthea DASEN, MD  Blood Glucose Monitoring Suppl (ACCU-CHEK GUIDE ME) w/Device KIT Use as directed 2 (two) times daily. 02/07/24   Fleming, Zelda W, NP  carvedilol  (COREG ) 12.5 MG tablet Take 1 tablet (12.5 mg total) by mouth 2 (two) times daily. 01/28/24   Lelon Hamilton T, PA-C  clopidogrel  (PLAVIX ) 75 MG tablet Take 1 tablet (75 mg total) by mouth daily. 08/03/23   Danton Jon HERO, PA-C  dapagliflozin  propanediol  (FARXIGA ) 5 MG TABS tablet Take 1 tablet (5 mg total) by mouth daily. 04/30/24   Fleming, Zelda W, NP  furosemide  (LASIX ) 20 MG tablet TAKE 1 TABLET (20 MG TOTAL) BY MOUTH DAILY. AS DIRECTED 04/04/24   Wyn Jackee VEAR Mickey., NP  glucose blood (TRUE METRIX BLOOD GLUCOSE TEST) test strip Use as instructed. Check blood glucose level by fingerstick twice per day. 02/07/24   Fleming, Zelda W, NP  metFORMIN  (GLUCOPHAGE ) 500 MG tablet Take 1 tablet (500 mg total) by mouth 2 (two) times daily with a meal. 04/29/24   Theotis Haze ORN, NP    Family History Family History  Problem Relation Age of Onset   Hypertension Mother     Social History Social History   Tobacco Use   Smoking status: Never   Smokeless tobacco: Never  Vaping Use   Vaping status: Never Used  Substance Use Topics   Alcohol use: Not Currently    Comment: occ   Drug use: No     Allergies   Patient has no known allergies.   Review of Systems Review of Systems   Physical Exam Triage Vital Signs ED Triage Vitals  Encounter Vitals Group     BP 05/30/24 1131 (!) 144/100     Girls Systolic BP Percentile --      Girls Diastolic BP Percentile --      Boys Systolic BP Percentile --      Boys Diastolic BP Percentile --      Pulse Rate 05/30/24 1131 79     Resp 05/30/24 1131 16     Temp 05/30/24 1131 98.6 F (37 C)     Temp Source 05/30/24 1131 Oral     SpO2 05/30/24 1131 96 %     Weight 05/30/24 1130 170 lb (77.1 kg)     Height 05/30/24 1130 5' 10 (1.778 m)     Head Circumference --      Peak Flow --      Pain Score 05/30/24 1128 0     Pain Loc --      Pain Education --      Exclude from Growth Chart --    No data found.  Updated Vital Signs BP (!) 144/100 (BP Location: Left Arm)   Pulse 79   Temp 98.6 F (37 C) (Oral)   Resp 16   Ht 5' 10 (1.778 m)   Wt 77.1 kg   SpO2 96%   BMI 24.39 kg/m   Visual Acuity Right Eye Distance: (S) 2025 (Corrected) Left Eye Distance: (S) 20/30 (Corrected) Bilateral  Distance: (S) 20/20 (Corrected)  Right Eye Near:   Left Eye Near:    Bilateral Near:     Physical Exam Vitals reviewed.  Constitutional:      General: He is not in acute distress.    Appearance: He is not ill-appearing, toxic-appearing or diaphoretic.  HENT:     Mouth/Throat:     Mouth: Mucous membranes are moist.  Eyes:     Extraocular Movements: Extraocular movements intact.     Pupils: Pupils are equal, round, and reactive to light.     Comments: There is mild injection of the conjunctiva of the left eye.  No swelling of the eyelids.  The right eye is normal  Neurological:     Mental Status: He is alert.      UC Treatments / Results  Labs (all labs ordered are listed, but only abnormal results are displayed) Labs Reviewed - No data to display  EKG   Radiology No results found.  Procedures Procedures (including critical care time)  Medications Ordered in UC Medications - No data to display  Initial Impression / Assessment and Plan / UC Course  I have reviewed the triage vital signs and the nursing notes.  Pertinent labs & imaging results that were available during my care of the patient were reviewed by me and considered in my medical decision making (see chart for details).     Gentamicin is sent in to treat the conjunctivitis.  He is given contact information for the ophthalmologist on-call so that if he is not improving much he can make an appointment with them. Final Clinical Impressions(s) / UC Diagnoses   Final diagnoses:  Acute conjunctivitis of left eye, unspecified acute conjunctivitis type     Discharge Instructions      Put gentamicin eyedrops in the affected eye(s) 3 times daily for 5 days.  Follow-up with ophthalmology if not improving.     ED Prescriptions     Medication Sig Dispense Auth. Provider   gentamicin (GARAMYCIN) 0.3 % ophthalmic solution Place 2 drops into the left eye 3 (three) times daily for 5 days. 5 mL Vonna Sharlet POUR, MD      PDMP not reviewed this encounter.   Vonna Sharlet POUR, MD 05/30/24 1300

## 2024-05-30 NOTE — Telephone Encounter (Signed)
 Left message to call cardiac rehab.Hadassah Elpidio Quan RN BSN

## 2024-05-30 NOTE — Discharge Instructions (Addendum)
 Put gentamicin eyedrops in the affected eye(s) 3 times daily for 5 days.  Follow-up with ophthalmology if not improving.

## 2024-05-30 NOTE — ED Triage Notes (Signed)
 Patient reports redness, irritation, and excessive tearing in the left eye. He suspects it may be conjunctivitis (pink eye). Denies any trauma. Currently wears corrective eyeglasses.

## 2024-05-31 ENCOUNTER — Encounter (HOSPITAL_COMMUNITY)

## 2024-06-03 ENCOUNTER — Encounter (HOSPITAL_COMMUNITY): Admission: RE | Admit: 2024-06-03 | Source: Ambulatory Visit

## 2024-06-05 ENCOUNTER — Telehealth (HOSPITAL_COMMUNITY): Payer: Self-pay

## 2024-06-05 ENCOUNTER — Encounter (HOSPITAL_COMMUNITY): Admission: RE | Admit: 2024-06-05 | Source: Ambulatory Visit

## 2024-06-05 NOTE — Telephone Encounter (Signed)
 Called patient regarding 5x no call, no shows for 10:15 CR class. Patient states he is moving to [place] and will not be attending cardiac rehab any longer. Cancelled appts, removed from schedule.

## 2024-06-06 NOTE — Progress Notes (Signed)
 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  ADL [facility]:   Initial Exercise Prescription:  Initial Exercise Prescription - 04/23/24 1500       Date of Initial Exercise RX and Referring Provider   Date 04/23/24    Referring Provider Shlomo Wilbert SAUNDERS, MD    Expected Discharge Date 07/15/24      NuStep   Level 1    SPM 75    Minutes 15    METs 2.5      Arm Ergometer   Level --    Watts --    RPM --    Minutes --    METs --      Track   Laps 11    Minutes 15    METs 2.4      Prescription Details   Frequency (times per week) 3    Duration Progress to 30 minutes of continuous aerobic without signs/symptoms of physical distress      Intensity   THRR 40-80% of Max Heartrate 66-132    Ratings of Perceived Exertion 11-13    Perceived Dyspnea 0-4      Progression   Progression Continue to progress workloads to maintain intensity without signs/symptoms of physical distress.      Resistance Training   Training Prescription Yes    Weight 3 lbs    Reps 10-15          Discharge Exercise Prescription (Final Exercise Prescription Changes):  Exercise Prescription Changes - 05/24/24 1025       Response to Exercise   Blood Pressure (Admit) 130/84    Blood Pressure (Exercise) 132/92    Blood Pressure (Exit) 104/80    Heart Rate (Admit) 85 bpm    Heart Rate (Exercise) 115 bpm    Heart Rate (Exit) 93 bpm    Rating of Perceived Exertion (Exercise) 12     Symptoms None    Comments Reviewed home exercise guidelines and goals with Sherwin.    Duration Continue with 30 min of aerobic exercise without signs/symptoms of physical distress.    Intensity THRR unchanged      Progression   Progression Continue to progress workloads to maintain intensity without signs/symptoms of physical distress.    Average METs 2      Resistance Training   Training Prescription Yes    Weight 3 lbs    Reps 10-15    Time 5 Minutes      Interval Training   Interval Training No      NuStep   Level 1    SPM 68    Minutes 15    METs 1.6      Track   Laps 11    Minutes 15    METs 2.4      Home Exercise Plan   Plans to continue exercise at Home (comment)   Walking   Frequency Add 3 additional days to program exercise sessions.  Initial Home Exercises Provided 05/24/24          Functional Capacity:  6 Minute Walk     Row Name 04/23/24 1026         6 Minute Walk   Phase Initial     Distance 989 feet     Walk Time 6 minutes     # of Rest Breaks 0     MPH 1.87     METS 3.48     RPE 10.5     Perceived Dyspnea  0     VO2 Peak 12.19     Symptoms No     Resting HR 89 bpm     Resting BP 132/78     Resting Oxygen Saturation  98 %     Exercise Oxygen Saturation  during 6 min walk 99 %     Max Ex. HR 104 bpm     Max Ex. BP 138/88     2 Minute Post BP 124/84        Psychological, QOL, Others - Outcomes: PHQ 2/9:    04/29/2024    1:44 PM 04/23/2024    9:25 AM 02/07/2024   10:27 AM 08/03/2023    9:31 AM 12/24/2020    3:47 PM  Depression screen PHQ 2/9  Decreased Interest 0 0 0 0 0  Down, Depressed, Hopeless 0 0 0 0 0  PHQ - 2 Score 0 0 0 0 0  Altered sleeping 0 0  0 0  Tired, decreased energy 0 0  0 0  Change in appetite 0 1  0 0  Feeling bad or failure about yourself  0 0  0 0  Trouble concentrating 0 0  0 0  Moving slowly or fidgety/restless 0 0  0 0  Suicidal thoughts 0 0  0 0  PHQ-9 Score 0 1  0 0  Difficult doing work/chores  Not difficult at all Not difficult at all       Quality of Life:  Quality of Life - 04/23/24 0939       Quality of Life   Select Quality of Life      Quality of Life Scores   Health/Function Pre 18.88 %    Socioeconomic Pre 19.17 %    Psych/Spiritual Pre 17.75 %    Family Pre 27.88 %    GLOBAL Pre 19.95 %          Personal Goals: Goals established at orientation with interventions provided to work toward goal.  Personal Goals and Risk Factors at Admission - 04/23/24 0916       Core Components/Risk Factors/Patient Goals on Admission   Diabetes Yes    Intervention Provide education about signs/symptoms and action to take for hypo/hyperglycemia.;Provide education about proper nutrition, including hydration, and aerobic/resistive exercise prescription along with prescribed medications to achieve blood glucose in normal ranges: Fasting glucose 65-99 mg/dL    Expected Outcomes Short Term: Participant verbalizes understanding of the signs/symptoms and immediate care of hyper/hypoglycemia, proper foot care and importance of medication, aerobic/resistive exercise and nutrition plan for blood glucose control.;Long Term: Attainment of HbA1C < 7%.    Hypertension Yes    Intervention Provide education on lifestyle modifcations including regular physical activity/exercise, weight management, moderate sodium restriction and increased consumption of fresh fruit, vegetables, and low fat dairy, alcohol moderation, and smoking cessation.;Monitor prescription use compliance.    Expected Outcomes Short Term: Continued assessment and intervention until BP is < 140/8mm HG in hypertensive participants. <  130/60mm HG in hypertensive participants with diabetes, heart failure or chronic kidney disease.;Long Term: Maintenance of blood pressure at goal levels.    Lipids Yes    Intervention Provide education and support for participant on nutrition & aerobic/resistive exercise along with prescribed medications  to achieve LDL 70mg , HDL >40mg .    Expected Outcomes Short Term: Participant states understanding of desired cholesterol values and is compliant with medications prescribed. Participant is following exercise prescription and nutrition guidelines.;Long Term: Cholesterol controlled with medications as prescribed, with individualized exercise RX and with personalized nutrition plan. Value goals: LDL < 70mg , HDL > 40 mg.    Personal Goal Other Yes    Personal Goal Improve mobility/ walking. Be able to go back to work.    Intervention Develop an exercise action plan that Kaius can do to help improve mobility, increase walking ability. Vocational Rehab information provided.    Expected Outcomes Kylan will be able to get around comfortably with better mobility as measured by self-report and 6-minute walk test.           Personal Goals Discharge:  Goals and Risk Factor Review     Row Name 04/29/24 1658 05/21/24 0948           Core Components/Risk Factors/Patient Goals Review   Personal Goals Review Weight Management/Obesity;Stress;Diabetes;Lipids;Hypertension Weight Management/Obesity;Stress;Diabetes;Lipids;Hypertension      Review Zethan started cardiac rehab on 04/29/24.Haris did fair with exercise. Vital signs and CBG's were stable. Duron is decontioned. Deshawn started cardiac rehab on 04/29/24.Jerell is doing well with exercise at cardiac rehab for his fitness level. Vital signs and CBG's have been stable.      Expected Outcomes Knowledge will continue to participate in cardiac rehab for exercise, nutrition and lifestyle modifcations. Sy will continue to participate in cardiac rehab for exercise, nutrition and lifestyle modifcations.         Exercise Goals and Review:  Exercise Goals     Row Name 04/23/24 0917             Exercise Goals   Increase Physical Activity Yes       Intervention Provide advice, education, support and counseling about physical activity/exercise  needs.;Develop an individualized exercise prescription for aerobic and resistive training based on initial evaluation findings, risk stratification, comorbidities and participant's personal goals.       Expected Outcomes Long Term: Exercising regularly at least 3-5 days a week.;Short Term: Attend rehab on a regular basis to increase amount of physical activity.;Long Term: Add in home exercise to make exercise part of routine and to increase amount of physical activity.       Increase Strength and Stamina Yes       Intervention Provide advice, education, support and counseling about physical activity/exercise needs.;Develop an individualized exercise prescription for aerobic and resistive training based on initial evaluation findings, risk stratification, comorbidities and participant's personal goals.       Expected Outcomes Short Term: Increase workloads from initial exercise prescription for resistance, speed, and METs.;Short Term: Perform resistance training exercises routinely during rehab and add in resistance training at home;Long Term: Improve cardiorespiratory fitness, muscular endurance and strength as measured by increased METs and functional capacity ( )       Able to understand and use rate of perceived exertion (RPE) scale Yes       Intervention Provide education and explanation on how to use RPE scale       Expected Outcomes Short Term: Able to use RPE daily in rehab to express  subjective intensity level;Long Term:  Able to use RPE to guide intensity level when exercising independently       Knowledge and understanding of Target Heart Rate Range (THRR) Yes       Intervention Provide education and explanation of THRR including how the numbers were predicted and where they are located for reference       Expected Outcomes Short Term: Able to state/look up THRR;Long Term: Able to use THRR to govern intensity when exercising independently;Short Term: Able to use daily as guideline for  intensity in rehab       Able to check pulse independently Yes       Intervention Provide education and demonstration on how to check pulse in carotid and radial arteries.;Review the importance of being able to check your own pulse for safety during independent exercise       Expected Outcomes Short Term: Able to explain why pulse checking is important during independent exercise;Long Term: Able to check pulse independently and accurately       Understanding of Exercise Prescription Yes       Intervention Provide education, explanation, and written materials on patient's individual exercise prescription       Expected Outcomes Short Term: Able to explain program exercise prescription;Long Term: Able to explain home exercise prescription to exercise independently          Exercise Goals Re-Evaluation:  Exercise Goals Re-Evaluation     Row Name 04/29/24 1134 05/24/24 1138           Exercise Goal Re-Evaluation   Exercise Goals Review Increase Physical Activity;Increase Strength and Stamina;Able to understand and use rate of perceived exertion (RPE) scale Increase Physical Activity;Increase Strength and Stamina;Able to understand and use rate of perceived exertion (RPE) scale;Knowledge and understanding of Target Heart Rate Range (THRR);Understanding of Exercise Prescription      Comments Kristy was able to understand and use RPE scale appropriately. Reviewed exercise prescription with Sherwin. He is walking 15 minutes 2-3 days/week. He's limited by left-sided weakness. He will see if he still has the stretches exercises from physical therapy. He has exercise band at home from when he was in PT. I will give him a hanout with some band exercises that he can  do at home with his bands. Encouraged him to increase his step/min on the recumbent stepper at cardiac rehab then we'll increase his level.      Expected Outcomes Progress workloads as tolerated to help increase strength and stamina. Continue  walking at home. Incorporate exercise bands at home for his resistance training.         Nutrition & Weight - Outcomes:  Pre Biometrics - 04/23/24 0820       Pre Biometrics   Waist Circumference 38.75 inches    Hip Circumference 40.75 inches    Waist to Hip Ratio 0.95 %    Triceps Skinfold 15 mm    % Body Fat 25.4 %    Grip Strength 21 kg    Flexibility 9.25 in    Single Leg Stand 7.37 seconds           Nutrition:   Nutrition Discharge:   Education Questionnaire Score:  Knowledge Questionnaire Score - 04/23/24 1559       Knowledge Questionnaire Score   Pre Score 16/24          Ryota attended 10 exercise and 4 education sessions between 04/23/24-05/24/24. Diane did well with exercise for his fitness level. Arley stopped participating in  the program as he is moving to [place] .Hadassah Elpidio Quan RN BSN

## 2024-06-07 ENCOUNTER — Ambulatory Visit (HOSPITAL_COMMUNITY)

## 2024-06-10 ENCOUNTER — Ambulatory Visit (HOSPITAL_COMMUNITY)

## 2024-06-12 ENCOUNTER — Ambulatory Visit (HOSPITAL_COMMUNITY)

## 2024-06-14 ENCOUNTER — Ambulatory Visit (HOSPITAL_COMMUNITY)

## 2024-06-17 ENCOUNTER — Other Ambulatory Visit: Payer: Self-pay

## 2024-08-19 ENCOUNTER — Ambulatory Visit: Admitting: Nurse Practitioner
